# Patient Record
Sex: Male | Born: 1960 | Hispanic: Refuse to answer | Marital: Married | Smoking: Current every day smoker
Health system: Southern US, Community
[De-identification: ages and names within clinical notes are randomized; demographics above are authoritative.]

## PROBLEM LIST (undated history)

## (undated) DIAGNOSIS — G8929 Other chronic pain: Secondary | ICD-10-CM

## (undated) DIAGNOSIS — R188 Other ascites: Secondary | ICD-10-CM

## (undated) DIAGNOSIS — I10 Essential (primary) hypertension: Secondary | ICD-10-CM

## (undated) DIAGNOSIS — I639 Cerebral infarction, unspecified: Secondary | ICD-10-CM

## (undated) DIAGNOSIS — M25552 Pain in left hip: Secondary | ICD-10-CM

## (undated) DIAGNOSIS — K7460 Unspecified cirrhosis of liver: Secondary | ICD-10-CM

## (undated) HISTORY — PX: CERVICAL SPINE SURGERY: SHX589

## (undated) HISTORY — PX: BACK SURGERY: SHX140

## (undated) HISTORY — PX: JOINT REPLACEMENT: SHX530

---

## 2009-04-06 ENCOUNTER — Emergency Department (HOSPITAL_COMMUNITY): Admission: EM | Admit: 2009-04-06 | Discharge: 2009-04-06 | Payer: Self-pay | Admitting: Emergency Medicine

## 2010-02-13 ENCOUNTER — Emergency Department (HOSPITAL_COMMUNITY): Admission: EM | Admit: 2010-02-13 | Discharge: 2010-02-13 | Payer: Self-pay | Admitting: Emergency Medicine

## 2010-12-09 LAB — URINALYSIS, ROUTINE W REFLEX MICROSCOPIC
Glucose, UA: NEGATIVE mg/dL
Hgb urine dipstick: NEGATIVE
Ketones, ur: 15 mg/dL — AB
Leukocytes, UA: NEGATIVE
Nitrite: NEGATIVE
Protein, ur: 30 mg/dL — AB
Specific Gravity, Urine: 1.033 — ABNORMAL HIGH (ref 1.005–1.030)
Urobilinogen, UA: 1 mg/dL (ref 0.0–1.0)
pH: 5 (ref 5.0–8.0)

## 2010-12-09 LAB — POCT I-STAT, CHEM 8
BUN: 26 mg/dL — ABNORMAL HIGH (ref 6–23)
Calcium, Ion: 1.28 mmol/L (ref 1.12–1.32)
Chloride: 95 mEq/L — ABNORMAL LOW (ref 96–112)
Creatinine, Ser: 1.8 mg/dL — ABNORMAL HIGH (ref 0.4–1.5)
Glucose, Bld: 103 mg/dL — ABNORMAL HIGH (ref 70–99)
HCT: 49 % (ref 39.0–52.0)
Hemoglobin: 16.7 g/dL (ref 13.0–17.0)
Potassium: 4.7 mEq/L (ref 3.5–5.1)
Sodium: 128 mEq/L — ABNORMAL LOW (ref 135–145)
TCO2: 28 mmol/L (ref 0–100)

## 2010-12-09 LAB — URINE CULTURE
Colony Count: NO GROWTH
Culture: NO GROWTH

## 2010-12-09 LAB — URINE MICROSCOPIC-ADD ON

## 2010-12-09 LAB — CK: Total CK: 685 U/L — ABNORMAL HIGH (ref 7–232)

## 2010-12-29 LAB — COMPREHENSIVE METABOLIC PANEL
ALT: 117 U/L — ABNORMAL HIGH (ref 0–53)
AST: 194 U/L — ABNORMAL HIGH (ref 0–37)
Albumin: 5 g/dL (ref 3.5–5.2)
Alkaline Phosphatase: 58 U/L (ref 39–117)
BUN: 26 mg/dL — ABNORMAL HIGH (ref 6–23)
CO2: 22 mEq/L (ref 19–32)
Calcium: 10.9 mg/dL — ABNORMAL HIGH (ref 8.4–10.5)
Chloride: 93 mEq/L — ABNORMAL LOW (ref 96–112)
Creatinine, Ser: 2.96 mg/dL — ABNORMAL HIGH (ref 0.4–1.5)
GFR calc Af Amer: 27 mL/min — ABNORMAL LOW (ref >60)
GFR calc non Af Amer: 23 mL/min — ABNORMAL LOW (ref >60)
Glucose, Bld: 110 mg/dL — ABNORMAL HIGH (ref 70–99)
Potassium: 4.9 mEq/L (ref 3.5–5.1)
Sodium: 133 mEq/L — ABNORMAL LOW (ref 135–145)
Total Bilirubin: 1.1 mg/dL (ref 0.3–1.2)
Total Protein: 10.1 g/dL — ABNORMAL HIGH (ref 6.0–8.3)

## 2010-12-29 LAB — CBC
HCT: 52.2 % — ABNORMAL HIGH (ref 39.0–52.0)
Hemoglobin: 17.6 g/dL — ABNORMAL HIGH (ref 13.0–17.0)
MCHC: 33.7 g/dL (ref 30.0–36.0)
MCV: 95.5 fL (ref 78.0–100.0)
Platelets: 256 10*3/uL (ref 150–400)
RBC: 5.46 MIL/uL (ref 4.22–5.81)
RDW: 13.1 % (ref 11.5–15.5)
WBC: 5.7 10*3/uL (ref 4.0–10.5)

## 2010-12-29 LAB — URINALYSIS, ROUTINE W REFLEX MICROSCOPIC
Glucose, UA: NEGATIVE mg/dL
Ketones, ur: 15 mg/dL — AB
Leukocytes, UA: NEGATIVE
Nitrite: NEGATIVE
Protein, ur: 100 mg/dL — AB
Specific Gravity, Urine: 1.021 (ref 1.005–1.030)
Urobilinogen, UA: 1 mg/dL (ref 0.0–1.0)
pH: 5 (ref 5.0–8.0)

## 2010-12-29 LAB — CK TOTAL AND CKMB (NOT AT ARMC)
CK, MB: 11.9 ng/mL — ABNORMAL HIGH (ref 0.3–4.0)
Relative Index: 0.9 (ref 0.0–2.5)
Total CK: 1284 U/L — ABNORMAL HIGH (ref 7–232)

## 2010-12-29 LAB — URINE MICROSCOPIC-ADD ON

## 2010-12-29 LAB — DIFFERENTIAL
Basophils Absolute: 0 10*3/uL (ref 0.0–0.1)
Basophils Relative: 0 % (ref 0–1)
Eosinophils Absolute: 0 10*3/uL (ref 0.0–0.7)
Eosinophils Relative: 0 % (ref 0–5)
Lymphocytes Relative: 17 % (ref 12–46)
Lymphs Abs: 1 10*3/uL (ref 0.7–4.0)
Monocytes Absolute: 0.3 10*3/uL (ref 0.1–1.0)
Monocytes Relative: 6 % (ref 3–12)
Neutro Abs: 4.3 10*3/uL (ref 1.7–7.7)
Neutrophils Relative %: 76 % (ref 43–77)

## 2010-12-29 LAB — BASIC METABOLIC PANEL
BUN: 27 mg/dL — ABNORMAL HIGH (ref 6–23)
CO2: 22 mEq/L (ref 19–32)
Calcium: 8.7 mg/dL (ref 8.4–10.5)
Chloride: 105 mEq/L (ref 96–112)
Creatinine, Ser: 1.95 mg/dL — ABNORMAL HIGH (ref 0.4–1.5)
GFR calc Af Amer: 44 mL/min — ABNORMAL LOW (ref >60)
GFR calc non Af Amer: 37 mL/min — ABNORMAL LOW (ref >60)
Glucose, Bld: 148 mg/dL — ABNORMAL HIGH (ref 70–99)
Potassium: 4.4 mEq/L (ref 3.5–5.1)
Sodium: 134 mEq/L — ABNORMAL LOW (ref 135–145)

## 2012-06-25 ENCOUNTER — Emergency Department (HOSPITAL_COMMUNITY)
Admission: EM | Admit: 2012-06-25 | Discharge: 2012-06-25 | Disposition: A | Discharge status: Home / Self Care | Payer: Self-pay | Attending: Emergency Medicine | Admitting: Emergency Medicine

## 2012-06-25 ENCOUNTER — Encounter (HOSPITAL_COMMUNITY): Payer: Self-pay | Admitting: *Deleted

## 2012-06-25 ENCOUNTER — Emergency Department (HOSPITAL_COMMUNITY): Payer: Self-pay

## 2012-06-25 DIAGNOSIS — M25559 Pain in unspecified hip: Secondary | ICD-10-CM | POA: Insufficient documentation

## 2012-06-25 DIAGNOSIS — R269 Unspecified abnormalities of gait and mobility: Secondary | ICD-10-CM | POA: Insufficient documentation

## 2012-06-25 DIAGNOSIS — I10 Essential (primary) hypertension: Secondary | ICD-10-CM | POA: Insufficient documentation

## 2012-06-25 HISTORY — DX: Essential (primary) hypertension: I10

## 2012-06-25 LAB — BASIC METABOLIC PANEL
BUN: 14 mg/dL (ref 6–23)
CO2: 23 mEq/L (ref 19–32)
Calcium: 10.5 mg/dL (ref 8.4–10.5)
Chloride: 105 mEq/L (ref 96–112)
Creatinine, Ser: 1.06 mg/dL (ref 0.50–1.35)
GFR calc Af Amer: >90 mL/min (ref >90)
GFR calc non Af Amer: 79 mL/min — ABNORMAL LOW (ref >90)
Glucose, Bld: 111 mg/dL — ABNORMAL HIGH (ref 70–99)
Potassium: 3.4 mEq/L — ABNORMAL LOW (ref 3.5–5.1)
Sodium: 138 mEq/L (ref 135–145)

## 2012-06-25 LAB — CBC WITH DIFFERENTIAL/PLATELET
Basophils Absolute: 0 10*3/uL (ref 0.0–0.1)
Basophils Relative: 0 % (ref 0–1)
Eosinophils Absolute: 0.2 10*3/uL (ref 0.0–0.7)
Eosinophils Relative: 3 % (ref 0–5)
HCT: 37.1 % — ABNORMAL LOW (ref 39.0–52.0)
Hemoglobin: 12.7 g/dL — ABNORMAL LOW (ref 13.0–17.0)
Lymphocytes Relative: 53 % — ABNORMAL HIGH (ref 12–46)
Lymphs Abs: 4.5 10*3/uL — ABNORMAL HIGH (ref 0.7–4.0)
MCH: 29.7 pg (ref 26.0–34.0)
MCHC: 34.2 g/dL (ref 30.0–36.0)
MCV: 86.9 fL (ref 78.0–100.0)
Monocytes Absolute: 0.7 10*3/uL (ref 0.1–1.0)
Monocytes Relative: 8 % (ref 3–12)
Neutro Abs: 3.1 10*3/uL (ref 1.7–7.7)
Neutrophils Relative %: 36 % — ABNORMAL LOW (ref 43–77)
Platelets: 242 10*3/uL (ref 150–400)
RBC: 4.27 MIL/uL (ref 4.22–5.81)
RDW: 14.2 % (ref 11.5–15.5)
WBC: 8.5 10*3/uL (ref 4.0–10.5)

## 2012-06-25 MED ORDER — KETOROLAC TROMETHAMINE 60 MG/2ML IM SOLN
60.0000 mg | Freq: Once | INTRAMUSCULAR | Status: AC
  Administered 2012-06-25: 60 mg via INTRAMUSCULAR
  Filled 2012-06-25: qty 2

## 2012-06-25 MED ORDER — OXYCODONE-ACETAMINOPHEN 5-325 MG PO TABS
2.0000 | ORAL_TABLET | Freq: Once | ORAL | Status: AC
  Administered 2012-06-25: 2 via ORAL
  Filled 2012-06-25: qty 2

## 2012-06-25 MED ORDER — OXYCODONE-ACETAMINOPHEN 5-325 MG PO TABS: 1.0000 | ORAL_TABLET | ORAL | Status: DC | PRN

## 2012-06-25 MED ORDER — NAPROXEN 500 MG PO TABS: 500.0000 mg | ORAL_TABLET | Freq: Two times a day (BID) | ORAL | Status: DC

## 2012-06-25 NOTE — Progress Notes (Signed)
Orthopedic Tech Progress Note Patient Details:  Calvin [name] October 24, 1960 [id number]  Ortho Devices Type of Ortho Device: Knee Immobilizer   Haskell Flirt 06/25/2012, 11:19 PM

## 2012-06-25 NOTE — ED Notes (Signed)
Ortho called for knee imobilizer.  

## 2012-06-25 NOTE — ED Provider Notes (Signed)
History     CSN: [id number]  Arrival date & time 06/25/12  4098   First MD Initiated Contact with Patient 06/25/12 2257      Chief Complaint  Patient presents with  . Hip Pain    (Consider location/radiation/quality/duration/timing/severity/associated sxs/prior treatment) HPI Comments: Pt has hx of hip surgery in the past in 1999, he had a spontaneous hip dislocation in July of this year and since that time he states that he has had recurrent dislocations and ongoing pain in his hip. This pain is constant, daily, not associated with fevers, worse with ambulation. He does require crutches to walk. He has not seen an orthopedist. He was recently incarcerated and has been released, his family and asked them to seek specialty followup.  Patient is a 51 y.o. male presenting with hip pain. The history is provided by the patient and a relative.  Hip Pain    Past Medical History  Diagnosis Date  . Hypertension     Past Surgical History  Procedure Date  . Joint replacement   . Back surgery   . Cervical spine surgery     No family history on file.  History  Substance Use Topics  . Smoking status: Current Every Day Smoker  . Smokeless tobacco: Not on file  . Alcohol Use: Yes      Review of Systems  Constitutional: Negative for fever and chills.  Gastrointestinal: Negative for nausea and vomiting.  Musculoskeletal: Positive for gait problem. Negative for back pain.  Skin: Negative for rash.  Neurological: Negative for weakness and numbness.    Allergies  Review of patient's allergies indicates no known allergies.  Home Medications   Current Outpatient Rx  Name Route Sig Dispense Refill  . IBUPROFEN 800 MG PO TABS Oral Take 800 mg by mouth every 8 (eight) hours as needed. For pain    . NAPROXEN 500 MG PO TABS Oral Take 1 tablet (500 mg total) by mouth 2 (two) times daily with a meal. 30 tablet 0  . OXYCODONE-ACETAMINOPHEN 5-325 MG PO TABS Oral Take 1 tablet by mouth  every 4 (four) hours as needed for pain. 20 tablet 0    BP 142/82  Pulse 90  Temp 98.3 F (36.8 C) (Oral)  Resp 20  SpO2 100%  Physical Exam  Constitutional: He appears well-developed and well-nourished. No distress.  HENT:  Head: Normocephalic and atraumatic.  Eyes: Conjunctivae normal are normal. No scleral icterus.  Cardiovascular: Normal rate and intact distal pulses.   Pulmonary/Chest: Effort normal.  Musculoskeletal: He exhibits tenderness ( Tenderness to palpation over the left hip, mild pain with range of motion, no leg length discrepancy, patient is able to ambulate but with significant difficulty.). He exhibits no edema.  Neurological: He is alert. Coordination normal.       Normal sensation and motor to the left lower extremity  Skin: Skin is warm and dry.    ED Course  Procedures (including critical care time)  Labs Reviewed  BASIC METABOLIC PANEL - Abnormal; Notable for the following:    Potassium 3.4 (*)     Glucose, Bld 111 (*)     GFR calc non Af Amer 79 (*)     All other components within normal limits  CBC WITH DIFFERENTIAL - Abnormal; Notable for the following:    Hemoglobin 12.7 (*)     HCT 37.1 (*)     Neutrophils Relative 36 (*)     Lymphocytes Relative 53 (*)  Lymphs Abs 4.5 (*)     All other components within normal limits   Dg Hip Complete Left  06/25/2012  *RADIOLOGY REPORT*  Clinical Data: Pain  LEFT HIP - COMPLETE 2+ VIEW  Comparison: None.  Findings: Changes of left hip arthroplasty.  Cephalad migration of the acetabular component with significant surrounding lucency.  The prosthetic femoral head is eccentrically positioned with respect to the acetabular component.  IMPRESSION: 1.  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 Significant lucency around the acetabular component suggests possible loosening or infection.   Original Report Authenticated By: Thora Lance III, M.D.      1. Hip pain       MDM  X-rays  show abnormal joint, arthroplasty present, migration of the acetabular component with poor alignment but no obvious dislocation. I doubt that the patient has an infection as he does not have a leukocytosis of fever or tachycardia and this is a chronic daily pain for months now. He will be given intramuscular Toradol, Percocet, home with pain medications and referral to an orthopedist.        Vida Roller, MD 06/25/12 [phone]

## 2012-06-25 NOTE — ED Notes (Addendum)
C/o L hip pain land leg swelling, occurred/ onset 03/01/12, c/o decreased circulation and swelling in L eg, 10/10 pain, using crutches. Alert, NAD, calm, interactive. Swelling noted to LLE, no pitting obvious. PT pulses palpable. Reports hip replacement in June, "feels like it is out of place".

## 2012-06-25 NOTE — ED Notes (Signed)
Pt not really willing to talk or elaborate with answer. Pt states that he [place] his [place] and they did not it back in place. Pt states he has been living with it dislocated since then and he just got out of [place].

## 2012-07-06 ENCOUNTER — Other Ambulatory Visit (HOSPITAL_COMMUNITY): Payer: Self-pay | Admitting: Orthopedic Surgery

## 2012-07-06 DIAGNOSIS — M25552 Pain in left hip: Secondary | ICD-10-CM

## 2012-07-06 DIAGNOSIS — M810 Age-related osteoporosis without current pathological fracture: Secondary | ICD-10-CM

## 2012-07-08 ENCOUNTER — Ambulatory Visit (HOSPITAL_COMMUNITY)
Admission: RE | Admit: 2012-07-08 | Discharge: 2012-07-08 | Disposition: A | Discharge status: Home / Self Care | Payer: Self-pay | Source: Ambulatory Visit | Attending: Orthopedic Surgery | Admitting: Orthopedic Surgery

## 2012-07-08 ENCOUNTER — Other Ambulatory Visit (HOSPITAL_COMMUNITY): Payer: Self-pay

## 2012-07-08 DIAGNOSIS — M949 Disorder of cartilage, unspecified: Secondary | ICD-10-CM | POA: Insufficient documentation

## 2012-07-08 DIAGNOSIS — Z96649 Presence of unspecified artificial hip joint: Secondary | ICD-10-CM | POA: Insufficient documentation

## 2012-07-08 DIAGNOSIS — M25559 Pain in unspecified hip: Secondary | ICD-10-CM | POA: Insufficient documentation

## 2012-07-08 DIAGNOSIS — M899 Disorder of bone, unspecified: Secondary | ICD-10-CM | POA: Insufficient documentation

## 2012-07-08 DIAGNOSIS — M810 Age-related osteoporosis without current pathological fracture: Secondary | ICD-10-CM

## 2012-07-08 DIAGNOSIS — M25552 Pain in left hip: Secondary | ICD-10-CM

## 2012-07-20 ENCOUNTER — Encounter (HOSPITAL_COMMUNITY): Payer: Self-pay | Admitting: *Deleted

## 2012-07-20 ENCOUNTER — Emergency Department (HOSPITAL_COMMUNITY)
Admission: EM | Admit: 2012-07-20 | Discharge: 2012-07-20 | Disposition: A | Discharge status: Home / Self Care | Payer: Self-pay | Attending: Emergency Medicine | Admitting: Emergency Medicine

## 2012-07-20 DIAGNOSIS — Z8781 Personal history of (healed) traumatic fracture: Secondary | ICD-10-CM | POA: Insufficient documentation

## 2012-07-20 DIAGNOSIS — I10 Essential (primary) hypertension: Secondary | ICD-10-CM | POA: Insufficient documentation

## 2012-07-20 DIAGNOSIS — F17200 Nicotine dependence, unspecified, uncomplicated: Secondary | ICD-10-CM | POA: Insufficient documentation

## 2012-07-20 DIAGNOSIS — Z79899 Other long term (current) drug therapy: Secondary | ICD-10-CM | POA: Insufficient documentation

## 2012-07-20 DIAGNOSIS — Z760 Encounter for issue of repeat prescription: Secondary | ICD-10-CM | POA: Insufficient documentation

## 2012-07-20 DIAGNOSIS — M25559 Pain in unspecified hip: Secondary | ICD-10-CM

## 2012-07-20 DIAGNOSIS — G8929 Other chronic pain: Secondary | ICD-10-CM

## 2012-07-20 DIAGNOSIS — Z9889 Other specified postprocedural states: Secondary | ICD-10-CM | POA: Insufficient documentation

## 2012-07-20 MED ORDER — OXYCODONE-ACETAMINOPHEN 7.5-325 MG PO TABS: 1.0000 | ORAL_TABLET | ORAL | Status: DC | PRN

## 2012-07-20 NOTE — ED Notes (Signed)
Pt is here with right hip pain and states it has been dislocated since July.  PT states seen here 2-3 weeks ago and pt states he was supposed to follow up with orthopedics and he went and had MRI done here.  Pt has ran out of pain medication and hopes he can get medicine and get another appointment

## 2012-07-20 NOTE — ED Notes (Signed)
Patient reports chronic left hip pain.

## 2012-07-20 NOTE — ED Provider Notes (Signed)
History  Scribed for Nelia Shi, MD, the patient was seen in room TR07C/TR07C. This chart was scribed by Candelaria Stagers. The patient's care started at 4:35 PM   CSN: [id number]  Arrival date & time 07/20/12  1625   None     Chief Complaint  Patient presents with  . Hip Pain    The history is provided by the patient. No language interpreter was used.   Calvin [name] is a 51 y.o. male who presents to the Emergency Department complaining of continued left hip pain that became worse over the last few days after he ran out of his pain medication.  Pt reports that he dislocated his hip in July of this year.  Pt was seen in the ED 2-3 weeks ago and was supposed to follow up with the orthopedics.  Patient missed his appointment yesterday because transportation issues.   Past Medical History  Diagnosis Date  . Hypertension     Past Surgical History  Procedure Date  . Joint replacement   . Back surgery   . Cervical spine surgery     No family history on file.  History  Substance Use Topics  . Smoking status: Current Every Day Smoker  . Smokeless tobacco: Not on file  . Alcohol Use: Yes      Review of Systems All other systems reviewed and are negative Allergies  Review of patient's allergies indicates no known allergies.  Home Medications   Current Outpatient Rx  Name Route Sig Dispense Refill  . ACETAMINOPHEN 325 MG PO TABS Oral Take 650 mg by mouth daily as needed. For pain    . HYDROCHLOROTHIAZIDE 25 MG PO TABS Oral Take 25 mg by mouth daily.    . OXYCODONE-ACETAMINOPHEN 5-325 MG PO TABS Oral Take 1 tablet by mouth every 4 (four) hours as needed. For pain    . OXYCODONE-ACETAMINOPHEN 7.5-325 MG PO TABS Oral Take 1 tablet by mouth every 4 (four) hours as needed for pain. 30 tablet 0    BP 144/96  Pulse 95  Temp 98.1 F (36.7 C) (Oral)  Resp 18  SpO2 99%  Physical Exam  Constitutional: He appears well-developed and well-nourished. No distress.  HENT:    Head: Normocephalic and atraumatic.  Eyes: Conjunctivae normal are normal. No scleral icterus.  Cardiovascular: Normal rate and intact distal pulses.   Pulmonary/Chest: Effort normal.  Musculoskeletal: He exhibits tenderness ( Tenderness to palpation over the left hip, mild pain with range of motion, no leg length discrepancy, patient is able to ambulate but with significant difficulty.). He exhibits no edema.  Neurological: He is alert. Coordination normal.       Normal sensation and motor to the left lower extremity  Skin: Skin is warm and dry.    ED Course  Procedures   DIAGNOSTIC STUDIES:  COORDINATION OF CARE:    Labs Reviewed - No data to display No results found.   1. Chronic hip pain       MDM  I personally performed the services described in this documentation, which was scribed in my presence. The recorded information has been reviewed and considered.    I refill the patient's medication.  I emphasized to him the importance of following up with orthopedic doctor for definitive treatment and care.  Patient has no fever or subjective signs of infection.      Nelia Shi, MD 07/20/12 [phone]

## 2012-08-03 ENCOUNTER — Encounter (HOSPITAL_COMMUNITY): Payer: Self-pay | Admitting: *Deleted

## 2012-08-03 ENCOUNTER — Emergency Department (HOSPITAL_COMMUNITY)
Admission: EM | Admit: 2012-08-03 | Discharge: 2012-08-03 | Disposition: A | Discharge status: Home / Self Care | Payer: Self-pay | Attending: Emergency Medicine | Admitting: Emergency Medicine

## 2012-08-03 DIAGNOSIS — F17200 Nicotine dependence, unspecified, uncomplicated: Secondary | ICD-10-CM | POA: Insufficient documentation

## 2012-08-03 DIAGNOSIS — G8929 Other chronic pain: Secondary | ICD-10-CM | POA: Insufficient documentation

## 2012-08-03 DIAGNOSIS — I10 Essential (primary) hypertension: Secondary | ICD-10-CM | POA: Insufficient documentation

## 2012-08-03 DIAGNOSIS — Z79899 Other long term (current) drug therapy: Secondary | ICD-10-CM | POA: Insufficient documentation

## 2012-08-03 DIAGNOSIS — M25559 Pain in unspecified hip: Secondary | ICD-10-CM | POA: Insufficient documentation

## 2012-08-03 HISTORY — DX: Other chronic pain: G89.29

## 2012-08-03 HISTORY — DX: Pain in left hip: M25.552

## 2012-08-03 MED ORDER — OXYCODONE-ACETAMINOPHEN 7.5-325 MG PO TABS: 1.0000 | ORAL_TABLET | ORAL | Status: DC | PRN

## 2012-08-03 MED ORDER — OXYCODONE-ACETAMINOPHEN 5-325 MG PO TABS
2.0000 | ORAL_TABLET | Freq: Once | ORAL | Status: AC
  Administered 2012-08-03: 2 via ORAL
  Filled 2012-08-03: qty 2

## 2012-08-03 NOTE — ED Provider Notes (Signed)
History  This chart was scribed for Laray Anger, DO by Bennett Scrape, ED Scribe. This patient was seen in room TR07C/TR07C and the patient's care was started at 3:34PM.  CSN: [id number]  Arrival date & time 08/03/12  1424   First MD Initiated Contact with Patient 08/03/12 1534      Chief Complaint  Patient presents with  . Hip Pain     The history is provided by the patient. No language interpreter was used.   Pt was seen at 4:00 PM  Calvin [name] is a 51 y.o. male who presents to the Emergency Department c/o gradual onset and persistence of constant acute flair of his chronic left hip pain for the past several days.  Pain began after he ran out of his percocet.  Pt is here today requesting a refill on his percocet prescription.  Pt describes his pain as per his usual chronic left hip pain attributed to a "bad hip joint replacement." He was seen here for the same on 07/20/12 and discharged with 7.5-325 mg Percocet prescription. He reports that he is currently following up with his orthopedist but couldn't get an appointment scheduled. He states that his orthopedist is trying to get him an appointment with a specialist at [facility] for this condition. Denies any change in his usual chronic pain pattern.  Pain worsens with palpation of the area and body position changes. Denies incont/retention of bowel or bladder, no saddle anesthesia, no focal motor weakness, no tingling/numbness in extremities, no fevers, no injury, no abd pain.   The symptoms have been associated with no other complaints. The patient has a significant history of similar symptoms previously, recently being evaluated for this complaint and multiple prior evals for same.     Dr. August Saucer is his Orthopedist.  Past Medical History  Diagnosis Date  . Hypertension   . Chronic left hip pain     Past Surgical History  Procedure Date  . Joint replacement   . Back surgery   . Cervical spine surgery      History    Substance Use Topics  . Smoking status: Current Every Day Smoker  . Smokeless tobacco: Not on file  . Alcohol Use: Yes      Review of Systems ROS: Statement: All systems negative except as marked or noted in the HPI; Constitutional: Negative for fever and chills. ; ; Eyes: Negative for eye pain, redness and discharge. ; ; ENMT: Negative for ear pain, hoarseness, nasal congestion, sinus pressure and sore throat. ; ; Cardiovascular: Negative for chest pain, palpitations, diaphoresis, dyspnea and peripheral edema. ; ; Respiratory: Negative for cough, wheezing and stridor. ; ; Gastrointestinal: Negative for nausea, vomiting, diarrhea, abdominal pain, blood in stool, hematemesis, jaundice and rectal bleeding. . ; ; Genitourinary: Negative for dysuria, flank pain and hematuria. ; ; Musculoskeletal: +chronic left hip pain. Negative for back pain and neck pain. Negative for swelling and trauma.; ; Skin: Negative for pruritus, rash, abrasions, blisters, bruising and skin lesion.; ; Neuro: Negative for headache, lightheadedness and neck stiffness. Negative for weakness, altered level of consciousness , altered mental status, extremity weakness, paresthesias, involuntary movement, seizure and syncope.       Allergies  Review of patient's allergies indicates no known allergies.  Home Medications   Current Outpatient Rx  Name  Route  Sig  Dispense  Refill  . ACETAMINOPHEN 500 MG PO TABS   Oral   Take 500 mg by mouth every 6 (six) hours as  needed. For pain         . HYDROCHLOROTHIAZIDE 25 MG PO TABS   Oral   Take 25 mg by mouth daily.         . OXYCODONE-ACETAMINOPHEN 7.5-325 MG PO TABS   Oral   Take 1 tablet by mouth every 4 (four) hours as needed for pain.   30 tablet   0     Triage Vitals: BP 148/93  Pulse 101  Temp 97.7 F (36.5 C) (Oral)  Resp 20  SpO2 99%  Physical Exam 1605: Physical examination:  Nursing notes reviewed; Vital signs and O2 SAT reviewed;  Constitutional:  Well developed, Well nourished, Well hydrated, In no acute distress; Head:  Normocephalic, atraumatic; Eyes: EOMI, PERRL, No scleral icterus; ENMT: Mouth and pharynx normal, Mucous membranes moist; Neck: Supple, Full range of motion, No lymphadenopathy; Cardiovascular: Regular rate and rhythm, No murmur, rub, or gallop; Respiratory: Breath sounds clear & equal bilaterally, No rales, rhonchi, wheezes.  Speaking full sentences with ease, Normal respiratory effort/excursion; Chest: Nontender, Movement normal; Abdomen: Soft, Nontender, Nondistended, Normal bowel sounds;Extremities: Pulses normal, +left hip tenderness to palp. No edema, No calf edema or asymmetry.; Neuro: AA&Ox3, Major CN grossly intact.  Speech clear. Walking with crutches. No gross focal motor or sensory deficits in extremities.; Skin: Color normal, Warm, Dry.   ED Course  Procedures   DIAGNOSTIC STUDIES: Oxygen Saturation is 99% on room air, normal by my interpretation.    COORDINATION OF CARE: 4:04 PM- Discussed discharge plan which includes short course of percocet refill for with pt at bedside and pt agreed to plan. Long hx of chronic pain with multiple ED visits for same.  Pt endorses acute flair of his usual long standing chronic pain today, no change from his usual chronic pain pattern.  Pt encouraged to f/u with his PMD, Orthopedist and Pain Management doctor for good continuity of care and control of his chronic pain.  Verb understanding.    MDM  MDM Reviewed: previous chart, nursing note and vitals Reviewed previous: x-ray        I personally performed the services described in this documentation, which was scribed in my presence. The recorded information has been reviewed and is accurate.    Laray Anger, DO 08/05/12 1228

## 2012-08-03 NOTE — ED Notes (Signed)
Pt is here with chronic dislocated hip and was given prescription and is out now..  Pt needs refill on percocet

## 2012-08-03 NOTE — ED Notes (Signed)
Pt has been seeing Dr. Dorene Grebe for chronic hip pain. Is waiting for Dr.  From [facility] to call for appoint. Pt is out of pain meds. Called Dr. Diamantina Providence office today but he was in surgery.-

## 2012-08-03 NOTE — ED Notes (Signed)
MD at bedside. 

## 2012-08-20 ENCOUNTER — Encounter (HOSPITAL_COMMUNITY): Payer: Self-pay | Admitting: Emergency Medicine

## 2012-08-20 ENCOUNTER — Emergency Department (HOSPITAL_COMMUNITY)
Admission: EM | Admit: 2012-08-20 | Discharge: 2012-08-20 | Disposition: A | Discharge status: Home / Self Care | Payer: Self-pay | Attending: Emergency Medicine | Admitting: Emergency Medicine

## 2012-08-20 DIAGNOSIS — M25559 Pain in unspecified hip: Secondary | ICD-10-CM | POA: Insufficient documentation

## 2012-08-20 DIAGNOSIS — Z96649 Presence of unspecified artificial hip joint: Secondary | ICD-10-CM | POA: Insufficient documentation

## 2012-08-20 DIAGNOSIS — M549 Dorsalgia, unspecified: Secondary | ICD-10-CM | POA: Insufficient documentation

## 2012-08-20 DIAGNOSIS — Z79899 Other long term (current) drug therapy: Secondary | ICD-10-CM | POA: Insufficient documentation

## 2012-08-20 DIAGNOSIS — I10 Essential (primary) hypertension: Secondary | ICD-10-CM | POA: Insufficient documentation

## 2012-08-20 DIAGNOSIS — G8929 Other chronic pain: Secondary | ICD-10-CM | POA: Insufficient documentation

## 2012-08-20 DIAGNOSIS — R509 Fever, unspecified: Secondary | ICD-10-CM | POA: Insufficient documentation

## 2012-08-20 DIAGNOSIS — R112 Nausea with vomiting, unspecified: Secondary | ICD-10-CM | POA: Insufficient documentation

## 2012-08-20 DIAGNOSIS — R5381 Other malaise: Secondary | ICD-10-CM | POA: Insufficient documentation

## 2012-08-20 DIAGNOSIS — R209 Unspecified disturbances of skin sensation: Secondary | ICD-10-CM | POA: Insufficient documentation

## 2012-08-20 DIAGNOSIS — F17200 Nicotine dependence, unspecified, uncomplicated: Secondary | ICD-10-CM | POA: Insufficient documentation

## 2012-08-20 MED ORDER — MORPHINE SULFATE 4 MG/ML IJ SOLN
6.0000 mg | Freq: Once | INTRAMUSCULAR | Status: AC
  Administered 2012-08-20: 6 mg via INTRAMUSCULAR
  Filled 2012-08-20: qty 2

## 2012-08-20 MED ORDER — HYDROCODONE-ACETAMINOPHEN 10-500 MG PO TABS: 1.0000 | ORAL_TABLET | Freq: Four times a day (QID) | ORAL | Status: DC | PRN

## 2012-08-20 MED ORDER — KETOROLAC TROMETHAMINE 30 MG/ML IJ SOLN
60.0000 mg | Freq: Once | INTRAMUSCULAR | Status: AC
  Administered 2012-08-20: 60 mg via INTRAMUSCULAR
  Filled 2012-08-20 (×2): qty 1

## 2012-08-20 NOTE — ED Notes (Addendum)
Pt here with c/o chronic left hip pain. Friend in room with patient reports in a loud harsh voice pt needs pain medication that will work, surgery and admitted to see a Child psychotherapist. Pt reports he has seen his orthopedic surgeon who refuses to do surgery on hip. Friend states "we are going to keep coming in until we get what we want". Pt sitting in chair, reports it provides more comfort. Pt ambulated to room with crutches from triage area.

## 2012-08-20 NOTE — ED Provider Notes (Signed)
History     CSN: [id number]  Arrival date & time 08/20/12  1478   First MD Initiated Contact with Patient 08/20/12 [phone]      Chief Complaint  Patient presents with  . Hip Pain    (Consider location/radiation/quality/duration/timing/severity/associated sxs/prior treatment) HPI Patient presents to the emergency department with left hip pain.  Patient has had chronic hip pain since July.  Patient, states that he was working when he felt a pop in his hip back in July and ever since has had pain issues.  Patient's been seen by Dr. August Saucer, and referred to orthopedist in [place].  Patient, states they were not able to perform any surgery until April.  Patient has weakness, numbness, nausea, vomiting, back pain, or fever. Past Medical History  Diagnosis Date  . Hypertension   . Chronic left hip pain     Past Surgical History  Procedure Date  . Joint replacement   . Back surgery   . Cervical spine surgery     History reviewed. No pertinent family history.  History  Substance Use Topics  . Smoking status: Current Every Day Smoker  . Smokeless tobacco: Not on file  . Alcohol Use: Yes      Review of Systems All other systems negative except as documented in the HPI. All pertinent positives and negatives as reviewed in the HPI. Allergies  Review of patient's allergies indicates no known allergies.  Home Medications   Current Outpatient Rx  Name  Route  Sig  Dispense  Refill  . ACETAMINOPHEN 500 MG PO TABS   Oral   Take 1,500 mg by mouth every 6 (six) hours as needed. For pain         . HYDROCHLOROTHIAZIDE 25 MG PO TABS   Oral   Take 25 mg by mouth daily.         . OXYCODONE-ACETAMINOPHEN 7.5-325 MG PO TABS   Oral   Take 1 tablet by mouth every 4 (four) hours as needed. For pain           BP 111/71  Pulse 89  Temp 98.1 F (36.7 C) (Oral)  Resp 16  Ht 5\' 7"  (1.702 m)  Wt 170 lb (77.111 kg)  BMI 26.63 kg/m2  SpO2 98%  Physical Exam  Nursing note and  vitals reviewed. Constitutional: He is oriented to person, place, and time. He appears well-developed and well-nourished. No distress.  HENT:  Head: Normocephalic and atraumatic.  Pulmonary/Chest: Effort normal.  Musculoskeletal:       Left hip: He exhibits decreased range of motion, tenderness and bony tenderness. He exhibits no swelling, no crepitus and no laceration.       Left ankle: Achilles tendon normal.       Legs: Neurological: He is alert and oriented to person, place, and time.  Skin: Skin is warm and dry.    ED Course  Procedures (including critical care time)  Patient is advised he may need long-term care for his chronic hip pain until surgery can be performed.  Patient is advised to return here as needed.  Patient, states that the Percocet tends or not helping his pain  MDM          Carlyle Dolly, PA-C 08/20/12 1024

## 2012-08-21 NOTE — ED Provider Notes (Signed)
Medical screening examination/treatment/procedure(s) were performed by non-physician practitioner and as supervising physician I was immediately available for consultation/collaboration.  Corynne Scibilia, MD 08/21/12 0653 

## 2012-10-07 ENCOUNTER — Emergency Department (HOSPITAL_COMMUNITY)
Admission: EM | Admit: 2012-10-07 | Discharge: 2012-10-07 | Disposition: A | Discharge status: Home / Self Care | Payer: Self-pay | Source: Home / Self Care

## 2012-10-08 ENCOUNTER — Encounter (HOSPITAL_COMMUNITY): Payer: Self-pay

## 2012-10-08 ENCOUNTER — Emergency Department (HOSPITAL_COMMUNITY)
Admission: EM | Admit: 2012-10-08 | Discharge: 2012-10-08 | Disposition: A | Discharge status: Home / Self Care | Payer: Medicaid Other | Attending: Emergency Medicine | Admitting: Emergency Medicine

## 2012-10-08 DIAGNOSIS — M25559 Pain in unspecified hip: Secondary | ICD-10-CM | POA: Insufficient documentation

## 2012-10-08 DIAGNOSIS — I10 Essential (primary) hypertension: Secondary | ICD-10-CM | POA: Insufficient documentation

## 2012-10-08 DIAGNOSIS — F17200 Nicotine dependence, unspecified, uncomplicated: Secondary | ICD-10-CM | POA: Insufficient documentation

## 2012-10-08 DIAGNOSIS — G8929 Other chronic pain: Secondary | ICD-10-CM | POA: Insufficient documentation

## 2012-10-08 DIAGNOSIS — Z9889 Other specified postprocedural states: Secondary | ICD-10-CM | POA: Insufficient documentation

## 2012-10-08 DIAGNOSIS — R209 Unspecified disturbances of skin sensation: Secondary | ICD-10-CM | POA: Insufficient documentation

## 2012-10-08 MED ORDER — HYDROCODONE-ACETAMINOPHEN 5-325 MG PO TABS: 1.0000 | ORAL_TABLET | Freq: Four times a day (QID) | ORAL | Status: DC | PRN

## 2012-10-08 MED ORDER — HYDROCODONE-ACETAMINOPHEN 5-325 MG PO TABS
2.0000 | ORAL_TABLET | Freq: Once | ORAL | Status: AC
  Administered 2012-10-08: 2 via ORAL
  Filled 2012-10-08: qty 1

## 2012-10-08 NOTE — ED Notes (Signed)
Pt presents with L hip pain after dislocating same in 1999.  Pt reports surgery is scheduled in April, reports percocet 5-325mg  is not helping, last took medication yesterday.  Prescribed by Dr. August Saucer.  Pt denies any recent injury.

## 2012-10-08 NOTE — ED Provider Notes (Signed)
History   This chart was scribed for non-physician practitioner working with Carleene Cooper III, MD by Frederik Pear, ED Scribe. This patient was seen in room TR05C/TR05C and the patient's care was started at 1513.   CSN: [id number]  Arrival date & time 10/08/12  1312   First MD Initiated Contact with Patient 10/08/12 1513      No chief complaint on file.   (Consider location/radiation/quality/duration/timing/severity/associated sxs/prior treatment) HPI  Calvin [name] is a 52 y.o. male  with a hx of a left hip replacement in 1999 presents to the Emergency Department complaining of persistent, moderate left hip pain with associated numbness and tingling that has been constant since July. He denies any recent injury, but reports that he is out of his pain medication, Vicidin 5-325 mg taken every 6 -8 hours, from Dr. Rise Paganini. He states that nothing makes it better and bearing weight makes it worse. He denies any bowel or bladder incontinence, loss of function or saddle anesthesia. He states that Dr. August Saucer has referred him to Dr. Nettie Elm in [facility] for surgery, which is scheduled in April. He denies any other chronic medical conditions that require daily medication.   Past Medical History  Diagnosis Date  . Hypertension   . Chronic left hip pain     Past Surgical History  Procedure Date  . Joint replacement   . Back surgery   . Cervical spine surgery     History reviewed. No pertinent family history.  History  Substance Use Topics  . Smoking status: Current Every Day Smoker  . Smokeless tobacco: Not on file  . Alcohol Use: Yes      Review of Systems  Constitutional: Negative for fever, diaphoresis, appetite change, fatigue and unexpected weight change.  HENT: Negative for mouth sores and neck stiffness.   Eyes: Negative for visual disturbance.  Respiratory: Negative for cough, chest tightness, shortness of breath and wheezing.   Cardiovascular: Negative for  chest pain.  Gastrointestinal: Negative for nausea, vomiting, abdominal pain, diarrhea and constipation.  Genitourinary: Negative for dysuria, urgency, frequency and hematuria.  Musculoskeletal: Negative for back pain.       Hip pain.  Skin: Negative for rash.  Neurological: Positive for numbness. Negative for syncope, light-headedness and headaches.  Hematological: Does not bruise/bleed easily.  Psychiatric/Behavioral: Negative for sleep disturbance. The patient is not nervous/anxious.   All other systems reviewed and are negative.    Allergies  Review of patient's allergies indicates no known allergies.  Home Medications   Current Outpatient Rx  Name  Route  Sig  Dispense  Refill  . OXYCODONE-ACETAMINOPHEN 5-325 MG PO TABS   Oral   Take 1 tablet by mouth every 4 (four) hours as needed. For pain,         . HYDROCODONE-ACETAMINOPHEN 5-325 MG PO TABS   Oral   Take 1 tablet by mouth every 6 (six) hours as needed for pain.   30 tablet   0     BP 123/76  Pulse 68  Temp 98.5 F (36.9 C) (Oral)  Resp 20  SpO2 99%  Physical Exam  Nursing note and vitals reviewed. Constitutional: He is oriented to person, place, and time. He appears well-developed and well-nourished. No distress.  HENT:  Head: Normocephalic and atraumatic.  Mouth/Throat: Oropharynx is clear and moist. No oropharyngeal exudate.  Eyes: Conjunctivae normal are normal. Pupils are equal, round, and reactive to light. No scleral icterus.  Neck: Normal range of motion and full  passive range of motion without pain. Neck supple. No spinous process tenderness and no muscular tenderness present.  Cardiovascular: Normal rate, regular rhythm, S1 normal, S2 normal, normal heart sounds and intact distal pulses.   No murmur heard. Pulses:      Radial pulses are 2+ on the right side, and 2+ on the left side.       Dorsalis pedis pulses are 2+ on the right side, and 2+ on the left side.       Posterior tibial pulses are  2+ on the right side, and 2+ on the left side.       He ha good pulses.  Pulmonary/Chest: Effort normal and breath sounds normal. No respiratory distress. He has no wheezes.  Abdominal: Soft. Bowel sounds are normal. He exhibits no mass. There is no tenderness. There is no rebound and no guarding.  Musculoskeletal: Normal range of motion. He exhibits tenderness. He exhibits no edema.       He ha full ROM in his right leg with 5/5 strength including dorsiflexion and plantarflexion. He has 3/5 strength in his left leg, but has strong dorsiflexion and plantarflexion.  He has decreased ROM in his left hip that is secondary to pain. He is unable to bear weight on the left, but is able to bear weight on the right without difficulty.   Lymphadenopathy:    He has no cervical adenopathy.  Neurological: He is alert and oriented to person, place, and time. No cranial nerve deficit. He exhibits normal muscle tone. Coordination normal.       Speech is clear and goal oriented, follows commands Normal strength in upper extremities bilaterally including strong and equal grip strength Sensation normal to light and sharp touch Moves extremities without ataxia, coordination intact  Skin: Skin is warm and dry. He is not diaphoretic.       He has a well healed midline scar on his lumbar area.  Psychiatric: He has a normal mood and affect.    ED Course  Procedures (including critical care time)  DIAGNOSTIC STUDIES: Oxygen Saturation is 97% on room air, adequate by my interpretation.    COORDINATION OF CARE:  16:06- Discussed planned course of treatment with the patient, including following up with a PCP and pain medication, who is agreeable at this time.  16:15- Medication Orders- Hydrocodone-acetaminophen (Norco/Vicodin) 5-325 mg per tablet 2 tablet- Once.  Labs Reviewed - No data to display No results found.   1. Hip pain, chronic       MDM  Driscilla Moats Kretchmer presents with chronic hip pain and  c/o being out of his pain medications.  Patient with L hip pain.  No neurological deficits and decreased strength and ROM in the L is 2/2 to pain and at pt's baseline.  Patient is unable to weight bear on the hip and has been advised not to do so by the surgeon, but is able to move with the R leg.  No loss of bowel or bladder control.  No concern for cauda equina.  No fever, night sweats, weight loss, h/o cancer, IVDU.  RICE protocol and pain medicine indicated and discussed with patient.  Patient's pain treated here in the department, will write prescription for pain medication but have advised patient will be unable to continue to write pain medication.  I have also discussed reasons to return immediately to the ER.  Patient expresses understanding and agrees with plan.  1. Medications: Vicodin, usual home medications 2. Treatment: rest,  drink plenty of fluids, take medications as prescribed 3. Follow Up: Please followup with your primary doctor for discussion of your diagnoses and further evaluation after today's visit; if you do not have a primary care doctor use the resource guide provided to find one;   I personally performed the services described in this documentation, which was scribed in my presence. The recorded information has been reviewed and is accurate.   Dahlia Client Felecia Stanfill, PA-C 10/09/12 [phone]

## 2012-10-09 NOTE — ED Provider Notes (Signed)
Medical screening examination/treatment/procedure(s) were performed by non-physician practitioner and as supervising physician I was immediately available for consultation/collaboration.   Teller Wakefield III, MD 10/09/12 1202 

## 2013-03-12 ENCOUNTER — Encounter (HOSPITAL_COMMUNITY): Payer: Self-pay | Admitting: Adult Health

## 2013-03-12 DIAGNOSIS — Z7982 Long term (current) use of aspirin: Secondary | ICD-10-CM | POA: Insufficient documentation

## 2013-03-12 DIAGNOSIS — L989 Disorder of the skin and subcutaneous tissue, unspecified: Secondary | ICD-10-CM | POA: Insufficient documentation

## 2013-03-12 DIAGNOSIS — L03211 Cellulitis of face: Secondary | ICD-10-CM | POA: Insufficient documentation

## 2013-03-12 DIAGNOSIS — L02419 Cutaneous abscess of limb, unspecified: Secondary | ICD-10-CM | POA: Insufficient documentation

## 2013-03-12 DIAGNOSIS — L0201 Cutaneous abscess of face: Secondary | ICD-10-CM | POA: Insufficient documentation

## 2013-03-12 DIAGNOSIS — Z79899 Other long term (current) drug therapy: Secondary | ICD-10-CM | POA: Insufficient documentation

## 2013-03-12 DIAGNOSIS — L03119 Cellulitis of unspecified part of limb: Secondary | ICD-10-CM | POA: Insufficient documentation

## 2013-03-12 DIAGNOSIS — I10 Essential (primary) hypertension: Secondary | ICD-10-CM | POA: Insufficient documentation

## 2013-03-12 DIAGNOSIS — F17200 Nicotine dependence, unspecified, uncomplicated: Secondary | ICD-10-CM | POA: Insufficient documentation

## 2013-03-12 LAB — CBC WITH DIFFERENTIAL/PLATELET
Basophils Absolute: 0.1 10*3/uL (ref 0.0–0.1)
Basophils Relative: 0 % (ref 0–1)
Eosinophils Absolute: 0.3 10*3/uL (ref 0.0–0.7)
Eosinophils Relative: 2 % (ref 0–5)
HCT: 38.2 % — ABNORMAL LOW (ref 39.0–52.0)
Hemoglobin: 12.6 g/dL — ABNORMAL LOW (ref 13.0–17.0)
Lymphocytes Relative: 29 % (ref 12–46)
Lymphs Abs: 4.2 10*3/uL — ABNORMAL HIGH (ref 0.7–4.0)
MCH: 30.1 pg (ref 26.0–34.0)
MCHC: 33 g/dL (ref 30.0–36.0)
MCV: 91.2 fL (ref 78.0–100.0)
Monocytes Absolute: 1.1 10*3/uL — ABNORMAL HIGH (ref 0.1–1.0)
Monocytes Relative: 7 % (ref 3–12)
Neutro Abs: 8.8 10*3/uL — ABNORMAL HIGH (ref 1.7–7.7)
Neutrophils Relative %: 61 % (ref 43–77)
Platelets: 320 10*3/uL (ref 150–400)
RBC: 4.19 MIL/uL — ABNORMAL LOW (ref 4.22–5.81)
RDW: 14.5 % (ref 11.5–15.5)
WBC: 14.4 10*3/uL — ABNORMAL HIGH (ref 4.0–10.5)

## 2013-03-12 LAB — BASIC METABOLIC PANEL
BUN: 23 mg/dL (ref 6–23)
CO2: 26 mEq/L (ref 19–32)
Calcium: 10.2 mg/dL (ref 8.4–10.5)
Chloride: 96 mEq/L (ref 96–112)
Creatinine, Ser: 1.2 mg/dL (ref 0.50–1.35)
GFR calc Af Amer: 78 mL/min — ABNORMAL LOW (ref >90)
GFR calc non Af Amer: 67 mL/min — ABNORMAL LOW (ref >90)
Glucose, Bld: 106 mg/dL — ABNORMAL HIGH (ref 70–99)
Potassium: 3.6 mEq/L (ref 3.5–5.1)
Sodium: 132 mEq/L — ABNORMAL LOW (ref 135–145)

## 2013-03-12 NOTE — ED Notes (Signed)
Presents with right facial swelling, right hip induration, right leg numbness ongoing since may 19th post hip replacement. Pt has large wound to pubic area. Pt states he has having chills. Pt is ambulatory at triage.

## 2013-03-13 ENCOUNTER — Emergency Department (HOSPITAL_COMMUNITY)
Admission: EM | Admit: 2013-03-13 | Discharge: 2013-03-13 | Disposition: A | Discharge status: Home / Self Care | Payer: Medicaid Other | Attending: Emergency Medicine | Admitting: Emergency Medicine

## 2013-03-13 DIAGNOSIS — S31104A Unspecified open wound of abdominal wall, left lower quadrant without penetration into peritoneal cavity, initial encounter: Secondary | ICD-10-CM

## 2013-03-13 DIAGNOSIS — L02419 Cutaneous abscess of limb, unspecified: Secondary | ICD-10-CM

## 2013-03-13 DIAGNOSIS — L0201 Cutaneous abscess of face: Secondary | ICD-10-CM

## 2013-03-13 MED ORDER — OXYCODONE-ACETAMINOPHEN 5-325 MG PO TABS: 1.0000 | ORAL_TABLET | Freq: Four times a day (QID) | ORAL | Status: DC | PRN

## 2013-03-13 MED ORDER — CLINDAMYCIN HCL 300 MG PO CAPS
300.0000 mg | ORAL_CAPSULE | Freq: Once | ORAL | Status: AC
  Administered 2013-03-13: 300 mg via ORAL
  Filled 2013-03-13: qty 1

## 2013-03-13 MED ORDER — CLINDAMYCIN HCL 150 MG PO CAPS: 150.0000 mg | ORAL_CAPSULE | Freq: Three times a day (TID) | ORAL | Status: DC

## 2013-03-13 MED ORDER — OXYCODONE-ACETAMINOPHEN 5-325 MG PO TABS
1.0000 | ORAL_TABLET | Freq: Once | ORAL | Status: AC
  Administered 2013-03-13: 1 via ORAL
  Filled 2013-03-13: qty 1

## 2013-03-13 NOTE — ED Notes (Signed)
Patient presents with possible abscess to right side of face x several days, erythremia and swelling present, no respiratory distress noted, patient speaking in full complete sentences.
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
## 2013-05-03 ENCOUNTER — Ambulatory Visit: Payer: Medicaid Other | Admitting: Physical Therapy

## 2013-05-10 ENCOUNTER — Ambulatory Visit: Payer: Medicaid Other | Attending: Orthopedic Surgery | Admitting: Physical Therapy

## 2013-05-10 DIAGNOSIS — M25559 Pain in unspecified hip: Secondary | ICD-10-CM | POA: Insufficient documentation

## 2013-05-10 DIAGNOSIS — IMO0001 Reserved for inherently not codable concepts without codable children: Secondary | ICD-10-CM | POA: Insufficient documentation

## 2013-05-10 DIAGNOSIS — M25659 Stiffness of unspecified hip, not elsewhere classified: Secondary | ICD-10-CM | POA: Insufficient documentation

## 2013-05-10 DIAGNOSIS — H5440 Blindness, one eye, unspecified eye: Secondary | ICD-10-CM | POA: Insufficient documentation

## 2013-05-10 DIAGNOSIS — Z96649 Presence of unspecified artificial hip joint: Secondary | ICD-10-CM | POA: Insufficient documentation

## 2013-05-25 ENCOUNTER — Ambulatory Visit: Payer: Medicaid Other | Attending: Orthopedic Surgery | Admitting: Physical Therapy

## 2013-05-25 DIAGNOSIS — M25559 Pain in unspecified hip: Secondary | ICD-10-CM | POA: Insufficient documentation

## 2013-05-25 DIAGNOSIS — Z96649 Presence of unspecified artificial hip joint: Secondary | ICD-10-CM | POA: Insufficient documentation

## 2013-05-25 DIAGNOSIS — IMO0001 Reserved for inherently not codable concepts without codable children: Secondary | ICD-10-CM | POA: Insufficient documentation

## 2013-05-25 DIAGNOSIS — M25659 Stiffness of unspecified hip, not elsewhere classified: Secondary | ICD-10-CM | POA: Insufficient documentation

## 2013-05-25 DIAGNOSIS — H5440 Blindness, one eye, unspecified eye: Secondary | ICD-10-CM | POA: Insufficient documentation

## 2013-05-30 ENCOUNTER — Ambulatory Visit: Payer: Medicaid Other | Admitting: Physical Therapy

## 2013-06-01 ENCOUNTER — Ambulatory Visit: Payer: Medicaid Other | Admitting: Physical Therapy

## 2013-06-06 ENCOUNTER — Ambulatory Visit: Payer: Medicaid Other | Admitting: Physical Therapy

## 2013-06-08 ENCOUNTER — Ambulatory Visit: Payer: Medicaid Other | Admitting: Physical Therapy

## 2013-06-10 ENCOUNTER — Ambulatory Visit: Payer: Medicaid Other | Admitting: Physical Therapy

## 2013-06-14 ENCOUNTER — Ambulatory Visit: Payer: Medicaid Other | Admitting: Physical Therapy

## 2013-06-17 ENCOUNTER — Ambulatory Visit: Payer: Medicaid Other | Admitting: Physical Therapy

## 2013-06-21 ENCOUNTER — Encounter: Payer: Medicaid Other | Admitting: Physical Therapy

## 2013-06-24 ENCOUNTER — Encounter: Payer: Medicaid Other | Admitting: Physical Therapy

## 2014-08-09 ENCOUNTER — Other Ambulatory Visit (INDEPENDENT_AMBULATORY_CARE_PROVIDER_SITE_OTHER): Payer: Self-pay

## 2014-08-09 DIAGNOSIS — R945 Abnormal results of liver function studies: Secondary | ICD-10-CM

## 2014-08-09 DIAGNOSIS — K8020 Calculus of gallbladder without cholecystitis without obstruction: Secondary | ICD-10-CM

## 2016-06-22 ENCOUNTER — Emergency Department (HOSPITAL_COMMUNITY): Payer: Medicare Other

## 2016-06-22 ENCOUNTER — Observation Stay (HOSPITAL_COMMUNITY)
Admission: EM | Admit: 2016-06-22 | Discharge: 2016-06-23 | Disposition: A | Discharge status: Home / Self Care | Payer: Medicare Other | Attending: Internal Medicine | Admitting: Internal Medicine

## 2016-06-22 ENCOUNTER — Encounter (HOSPITAL_COMMUNITY): Payer: Self-pay | Admitting: Emergency Medicine

## 2016-06-22 DIAGNOSIS — Z79899 Other long term (current) drug therapy: Secondary | ICD-10-CM | POA: Insufficient documentation

## 2016-06-22 DIAGNOSIS — G8929 Other chronic pain: Secondary | ICD-10-CM | POA: Diagnosis present

## 2016-06-22 DIAGNOSIS — F17200 Nicotine dependence, unspecified, uncomplicated: Secondary | ICD-10-CM | POA: Diagnosis not present

## 2016-06-22 DIAGNOSIS — Z96642 Presence of left artificial hip joint: Secondary | ICD-10-CM | POA: Diagnosis not present

## 2016-06-22 DIAGNOSIS — Z7982 Long term (current) use of aspirin: Secondary | ICD-10-CM | POA: Insufficient documentation

## 2016-06-22 DIAGNOSIS — E782 Mixed hyperlipidemia: Secondary | ICD-10-CM

## 2016-06-22 DIAGNOSIS — F1010 Alcohol abuse, uncomplicated: Secondary | ICD-10-CM | POA: Diagnosis present

## 2016-06-22 DIAGNOSIS — M25552 Pain in left hip: Secondary | ICD-10-CM | POA: Insufficient documentation

## 2016-06-22 DIAGNOSIS — Y901 Blood alcohol level of 20-39 mg/100 ml: Secondary | ICD-10-CM | POA: Diagnosis not present

## 2016-06-22 DIAGNOSIS — I10 Essential (primary) hypertension: Secondary | ICD-10-CM | POA: Diagnosis not present

## 2016-06-22 DIAGNOSIS — H5461 Unqualified visual loss, right eye, normal vision left eye: Secondary | ICD-10-CM | POA: Diagnosis not present

## 2016-06-22 DIAGNOSIS — E785 Hyperlipidemia, unspecified: Secondary | ICD-10-CM | POA: Diagnosis not present

## 2016-06-22 DIAGNOSIS — F1210 Cannabis abuse, uncomplicated: Secondary | ICD-10-CM | POA: Insufficient documentation

## 2016-06-22 DIAGNOSIS — E871 Hypo-osmolality and hyponatremia: Secondary | ICD-10-CM | POA: Diagnosis present

## 2016-06-22 DIAGNOSIS — G459 Transient cerebral ischemic attack, unspecified: Principal | ICD-10-CM | POA: Diagnosis present

## 2016-06-22 DIAGNOSIS — R0682 Tachypnea, not elsewhere classified: Secondary | ICD-10-CM

## 2016-06-22 DIAGNOSIS — R270 Ataxia, unspecified: Secondary | ICD-10-CM | POA: Diagnosis present

## 2016-06-22 DIAGNOSIS — H5440 Blindness, one eye, unspecified eye: Secondary | ICD-10-CM

## 2016-06-22 LAB — I-STAT TROPONIN, ED: Troponin i, poc: 0 ng/mL (ref 0.00–0.08)

## 2016-06-22 LAB — DIFFERENTIAL
Basophils Absolute: 0 10*3/uL (ref 0.0–0.1)
Basophils Relative: 1 %
Eosinophils Absolute: 0.1 10*3/uL (ref 0.0–0.7)
Eosinophils Relative: 1 %
Lymphocytes Relative: 36 %
Lymphs Abs: 2.1 10*3/uL (ref 0.7–4.0)
Monocytes Absolute: 0.7 10*3/uL (ref 0.1–1.0)
Monocytes Relative: 11 %
Neutro Abs: 3 10*3/uL (ref 1.7–7.7)
Neutrophils Relative %: 51 %

## 2016-06-22 LAB — I-STAT CHEM 8, ED
BUN: 10 mg/dL (ref 6–20)
Calcium, Ion: 1.31 mmol/L (ref 1.15–1.40)
Chloride: 104 mmol/L (ref 101–111)
Creatinine, Ser: 0.9 mg/dL (ref 0.61–1.24)
Glucose, Bld: 91 mg/dL (ref 65–99)
HCT: 43 % (ref 39.0–52.0)
Hemoglobin: 14.6 g/dL (ref 13.0–17.0)
Potassium: 4.5 mmol/L (ref 3.5–5.1)
Sodium: 135 mmol/L (ref 135–145)
TCO2: 21 mmol/L (ref 0–100)

## 2016-06-22 LAB — URINALYSIS, ROUTINE W REFLEX MICROSCOPIC
Bilirubin Urine: NEGATIVE
Glucose, UA: NEGATIVE mg/dL
Hgb urine dipstick: NEGATIVE
Ketones, ur: NEGATIVE mg/dL
Leukocytes, UA: NEGATIVE
Nitrite: NEGATIVE
Protein, ur: NEGATIVE mg/dL
Specific Gravity, Urine: 1.025 (ref 1.005–1.030)
pH: 5.5 (ref 5.0–8.0)

## 2016-06-22 LAB — PROTIME-INR
INR: 1.06
Prothrombin Time: 13.8 seconds (ref 11.4–15.2)

## 2016-06-22 LAB — COMPREHENSIVE METABOLIC PANEL
ALT: 49 U/L (ref 17–63)
AST: 63 U/L — ABNORMAL HIGH (ref 15–41)
Albumin: 3.6 g/dL (ref 3.5–5.0)
Alkaline Phosphatase: 47 U/L (ref 38–126)
Anion gap: 10 (ref 5–15)
BUN: 9 mg/dL (ref 6–20)
CO2: 19 mmol/L — ABNORMAL LOW (ref 22–32)
Calcium: 10.4 mg/dL — ABNORMAL HIGH (ref 8.9–10.3)
Chloride: 102 mmol/L (ref 101–111)
Creatinine, Ser: 0.97 mg/dL (ref 0.61–1.24)
GFR calc Af Amer: >60 mL/min (ref >60)
GFR calc non Af Amer: >60 mL/min (ref >60)
Glucose, Bld: 89 mg/dL (ref 65–99)
Potassium: 4.5 mmol/L (ref 3.5–5.1)
Sodium: 131 mmol/L — ABNORMAL LOW (ref 135–145)
Total Bilirubin: 0.9 mg/dL (ref 0.3–1.2)
Total Protein: 7.5 g/dL (ref 6.5–8.1)

## 2016-06-22 LAB — CBC
HCT: 39.7 % (ref 39.0–52.0)
Hemoglobin: 13.2 g/dL (ref 13.0–17.0)
MCH: 31.5 pg (ref 26.0–34.0)
MCHC: 33.2 g/dL (ref 30.0–36.0)
MCV: 94.7 fL (ref 78.0–100.0)
Platelets: 196 10*3/uL (ref 150–400)
RBC: 4.19 MIL/uL — ABNORMAL LOW (ref 4.22–5.81)
RDW: 13.5 % (ref 11.5–15.5)
WBC: 5.9 10*3/uL (ref 4.0–10.5)

## 2016-06-22 LAB — RAPID URINE DRUG SCREEN, HOSP PERFORMED
Amphetamines: NOT DETECTED
Barbiturates: NOT DETECTED
Benzodiazepines: NOT DETECTED
Cocaine: NOT DETECTED
Opiates: NOT DETECTED
Tetrahydrocannabinol: POSITIVE — AB

## 2016-06-22 LAB — APTT: aPTT: 32 seconds (ref 24–36)

## 2016-06-22 LAB — ETHANOL: Alcohol, Ethyl (B): 22 mg/dL — ABNORMAL HIGH (ref <5)

## 2016-06-22 MED ORDER — CYCLOBENZAPRINE HCL 10 MG PO TABS
10.0000 mg | ORAL_TABLET | Freq: Once | ORAL | Status: AC
  Administered 2016-06-22: 10 mg via ORAL
  Filled 2016-06-22: qty 1

## 2016-06-22 NOTE — ED Notes (Signed)
Patient transported to CT 

## 2016-06-22 NOTE — ED Triage Notes (Signed)
Pt presents from home with [facility] for gait abnormality and LEFT hip weakness/pain; pt states chronic pain in LEFT hip and lower back; pt denies new injury; no facial droop, speech abnormality, or sensory deficit noted; pt reports drinking 2 beers as per usual; family called 911 because they were concerned about his gait; pt states he has an appt this week regarding his LEFT hip; hx of LEFT hip arthroplasty; VSS per EMS; PIV per EMS; no meds given by EMS

## 2016-06-22 NOTE — ED Notes (Addendum)
Pt ambulated in hallway with cane assistive device; gait was steady; pt denies dizziness or lightheadedness; pt denies any abnormality in gait (at baseline per pt); pt returned to bed and will continue to monitor
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
## 2016-06-22 NOTE — ED Notes (Signed)
Patient transported to X-ray 

## 2016-06-22 NOTE — ED Notes (Signed)
Jimmye Norman, MD does not want code stroke activation, only code stroke work-up

## 2016-06-23 ENCOUNTER — Observation Stay (HOSPITAL_COMMUNITY): Payer: Medicare Other

## 2016-06-23 ENCOUNTER — Encounter (HOSPITAL_COMMUNITY): Payer: Self-pay | Admitting: Radiology

## 2016-06-23 DIAGNOSIS — E785 Hyperlipidemia, unspecified: Secondary | ICD-10-CM

## 2016-06-23 DIAGNOSIS — M25552 Pain in left hip: Secondary | ICD-10-CM

## 2016-06-23 DIAGNOSIS — G8929 Other chronic pain: Secondary | ICD-10-CM | POA: Diagnosis not present

## 2016-06-23 DIAGNOSIS — E782 Mixed hyperlipidemia: Secondary | ICD-10-CM

## 2016-06-23 DIAGNOSIS — I10 Essential (primary) hypertension: Secondary | ICD-10-CM | POA: Diagnosis not present

## 2016-06-23 DIAGNOSIS — H5440 Blindness, one eye, unspecified eye: Secondary | ICD-10-CM

## 2016-06-23 DIAGNOSIS — G459 Transient cerebral ischemic attack, unspecified: Secondary | ICD-10-CM | POA: Diagnosis present

## 2016-06-23 DIAGNOSIS — E871 Hypo-osmolality and hyponatremia: Secondary | ICD-10-CM | POA: Diagnosis present

## 2016-06-23 DIAGNOSIS — F1010 Alcohol abuse, uncomplicated: Secondary | ICD-10-CM | POA: Diagnosis present

## 2016-06-23 HISTORY — DX: Transient cerebral ischemic attack, unspecified: G45.9

## 2016-06-23 LAB — BASIC METABOLIC PANEL
Anion gap: 10 (ref 5–15)
BUN: 10 mg/dL (ref 6–20)
CO2: 21 mmol/L — ABNORMAL LOW (ref 22–32)
Calcium: 10.4 mg/dL — ABNORMAL HIGH (ref 8.9–10.3)
Chloride: 104 mmol/L (ref 101–111)
Creatinine, Ser: 0.86 mg/dL (ref 0.61–1.24)
GFR calc Af Amer: >60 mL/min (ref >60)
GFR calc non Af Amer: >60 mL/min (ref >60)
Glucose, Bld: 163 mg/dL — ABNORMAL HIGH (ref 65–99)
Potassium: 3.6 mmol/L (ref 3.5–5.1)
Sodium: 135 mmol/L (ref 135–145)

## 2016-06-23 LAB — CBC
HCT: 40.1 % (ref 39.0–52.0)
Hemoglobin: 13.3 g/dL (ref 13.0–17.0)
MCH: 31.2 pg (ref 26.0–34.0)
MCHC: 33.2 g/dL (ref 30.0–36.0)
MCV: 94.1 fL (ref 78.0–100.0)
Platelets: 214 10*3/uL (ref 150–400)
RBC: 4.26 MIL/uL (ref 4.22–5.81)
RDW: 13.5 % (ref 11.5–15.5)
WBC: 5.5 10*3/uL (ref 4.0–10.5)

## 2016-06-23 LAB — LIPID PANEL
Cholesterol: 170 mg/dL (ref 0–200)
HDL: 38 mg/dL — ABNORMAL LOW (ref >40)
LDL Cholesterol: 107 mg/dL — ABNORMAL HIGH (ref 0–99)
Total CHOL/HDL Ratio: 4.5 RATIO
Triglycerides: 123 mg/dL (ref <150)
VLDL: 25 mg/dL (ref 0–40)

## 2016-06-23 MED ORDER — OXYCODONE-ACETAMINOPHEN 5-325 MG PO TABS: 1.0000 | ORAL_TABLET | Freq: Four times a day (QID) | ORAL | Status: DC | PRN

## 2016-06-23 MED ORDER — LISINOPRIL-HYDROCHLOROTHIAZIDE 20-12.5 MG PO TABS: 1.0000 | ORAL_TABLET | Freq: Every day | ORAL | Status: DC

## 2016-06-23 MED ORDER — STROKE: EARLY STAGES OF RECOVERY BOOK
Freq: Once | Status: DC
  Filled 2016-06-23: qty 1

## 2016-06-23 MED ORDER — ADULT MULTIVITAMIN W/MINERALS CH
1.0000 | ORAL_TABLET | Freq: Every day | ORAL | Status: DC
  Administered 2016-06-23: 1 via ORAL
  Filled 2016-06-23: qty 1

## 2016-06-23 MED ORDER — THIAMINE HCL 100 MG/ML IJ SOLN: 100.0000 mg | Freq: Every day | INTRAMUSCULAR | Status: DC

## 2016-06-23 MED ORDER — LISINOPRIL 20 MG PO TABS
20.0000 mg | ORAL_TABLET | Freq: Every day | ORAL | Status: DC
Start: 2016-06-23 — End: 2016-06-23
  Administered 2016-06-23: 20 mg via ORAL
  Filled 2016-06-23: qty 1

## 2016-06-23 MED ORDER — LORAZEPAM 2 MG/ML IJ SOLN: 1.0000 mg | Freq: Four times a day (QID) | INTRAMUSCULAR | Status: DC | PRN

## 2016-06-23 MED ORDER — SODIUM CHLORIDE 0.9 % IV SOLN
INTRAVENOUS | Status: DC
  Administered 2016-06-23: 03:00:00 via INTRAVENOUS

## 2016-06-23 MED ORDER — IOPAMIDOL (ISOVUE-370) INJECTION 76%
INTRAVENOUS | Status: AC
  Administered 2016-06-23: 50 mL
  Filled 2016-06-23: qty 50

## 2016-06-23 MED ORDER — DOCUSATE SODIUM 100 MG PO CAPS: 100.0000 mg | ORAL_CAPSULE | Freq: Every day | ORAL | Status: DC | PRN

## 2016-06-23 MED ORDER — SILVER SULFADIAZINE 1 % EX CREA: 1.0000 "application " | TOPICAL_CREAM | Freq: Two times a day (BID) | CUTANEOUS | Status: DC

## 2016-06-23 MED ORDER — ATORVASTATIN CALCIUM 10 MG PO TABS: 10.0000 mg | ORAL_TABLET | Freq: Every day | ORAL | Status: DC

## 2016-06-23 MED ORDER — FOLIC ACID 1 MG PO TABS
1.0000 mg | ORAL_TABLET | Freq: Every day | ORAL | Status: DC
  Administered 2016-06-23: 1 mg via ORAL
  Filled 2016-06-23: qty 1

## 2016-06-23 MED ORDER — HYDROCHLOROTHIAZIDE 12.5 MG PO CAPS
12.5000 mg | ORAL_CAPSULE | Freq: Every day | ORAL | Status: DC
  Administered 2016-06-23: 12.5 mg via ORAL
  Filled 2016-06-23: qty 1

## 2016-06-23 MED ORDER — ENOXAPARIN SODIUM 40 MG/0.4ML ~~LOC~~ SOLN
40.0000 mg | Freq: Every day | SUBCUTANEOUS | Status: DC
  Filled 2016-06-23 (×2): qty 0.4

## 2016-06-23 MED ORDER — NICOTINE 21 MG/24HR TD PT24
21.0000 mg | MEDICATED_PATCH | Freq: Every day | TRANSDERMAL | Status: DC
  Administered 2016-06-23: 21 mg via TRANSDERMAL
  Filled 2016-06-23: qty 1

## 2016-06-23 MED ORDER — LORAZEPAM 1 MG PO TABS: 1.0000 mg | ORAL_TABLET | Freq: Four times a day (QID) | ORAL | Status: DC | PRN

## 2016-06-23 MED ORDER — ASPIRIN EC 325 MG PO TBEC
325.0000 mg | DELAYED_RELEASE_TABLET | Freq: Every day | ORAL | Status: DC
  Administered 2016-06-23: 325 mg via ORAL
  Filled 2016-06-23: qty 1

## 2016-06-23 MED ORDER — VITAMIN B-1 100 MG PO TABS
100.0000 mg | ORAL_TABLET | Freq: Every day | ORAL | Status: DC
  Administered 2016-06-23: 100 mg via ORAL
  Filled 2016-06-23: qty 1

## 2016-06-23 NOTE — Plan of Care (Signed)
Problem: Safety: Goal: Ability to remain free from injury will improve Outcome: Progressing Patient states that he has underlying weakness in LLE.  He uses a straight cane as needed   Problem: Activity: Goal: Risk for activity intolerance will decrease Outcome: Progressing Patient able to ambulate in room with no assistance

## 2016-06-23 NOTE — Evaluation (Addendum)
Physical Therapy Evaluation and Discharge Patient Details Name: Calvin [name] MRN: [id number] DOB: 1961-06-25 Today's Date: 06/23/2016   History of Present Illness  55 y.o.malewho does not feel that anything happened at all and that people are making a big deal out of nothing. His daughter states that the patient's brothers stopped by earlier today and found him to be confused. He was consistently stumbling to the left and running into things on his left side. MRI negative PMHx- blind Rt eye, L THR, cervical and back surgery    Clinical Impression  Patient evaluated by Physical Therapy with no further PT needs identified. Patient has a mild limp at baseline due to hip pain. No drift or imbalance noted. PT is signing off. Thank you for this referral.     Follow Up Recommendations No PT follow up    Equipment Recommendations  None recommended by PT    Recommendations for Other Services       Precautions / Restrictions Precautions Precautions: None      Mobility  Bed Mobility Overal bed mobility: Independent                Transfers Overall transfer level: Independent Equipment used: None                Ambulation/Gait Ambulation/Gait assistance: Independent Ambulation Distance (Feet): 200 Feet Assistive device: None Gait Pattern/deviations: WFL(Within Functional Limits);Antalgic (slight limp)   Gait velocity interpretation: at or above normal speed for age/gender    Stairs            Wheelchair Mobility    Modified Rankin (Stroke Patients Only) Modified Rankin (Stroke Patients Only) Pre-Morbid Rankin Score: No significant disability Modified Rankin: No significant disability     Balance Overall balance assessment: Independent                       Rhomberg - Eyes Opened: 30 Rhomberg - Eyes Closed: 30 High level balance activites: Direction changes;Turns;Sudden stops;Head turns High Level Balance Comments: no imbalance  noted Standardized Balance Assessment Standardized Balance Assessment : Dynamic Gait Index   Dynamic Gait Index Level Surface: Normal Change in Gait Speed: Normal Gait with Horizontal Head Turns: Normal Gait with Vertical Head Turns: Normal Gait and Pivot Turn: Normal Step Over Obstacle: Normal Step Around Obstacles: Normal Steps:  (deferred due to pt has no steps)       Pertinent Vitals/Pain Pain Assessment: No/denies pain    Home Living Family/patient expects to be discharged to:: Private residence Living Arrangements: Other relatives (brother) Available Help at Discharge: Family;Available 24 hours/day   Home Access: Level entry     Home Layout: One level Home Equipment: Cane - single point      Prior Function Level of Independence: Independent with assistive device(s)         Comments: no device for walking indoors; outdoors takes his cane as unlevel ground increases his hip pain     Hand Dominance        Extremity/Trunk Assessment   Upper Extremity Assessment: Overall WFL for tasks assessed;Defer to OT evaluation           Lower Extremity Assessment: Overall WFL for tasks assessed      Cervical / Trunk Assessment: Normal  Communication   Communication: No difficulties  Cognition Arousal/Alertness: Awake/alert Behavior During Therapy: WFL for tasks assessed/performed Overall Cognitive Status: Within Functional Limits for tasks assessed  General Comments      Exercises     Assessment/Plan    PT Assessment Patent does not need any further PT services  PT Problem List            PT Treatment Interventions      PT Goals (Current goals can be found in the Care Plan section)  Acute Rehab PT Goals Patient Stated Goal: return home today PT Goal Formulation: All assessment and education complete, DC therapy    Frequency     Barriers to discharge        Co-evaluation               End of Session  Equipment Utilized During Treatment: Gait belt Activity Tolerance: Patient tolerated treatment well Patient left: in chair;with call bell/phone within reach Nurse Communication: Mobility status    Functional Assessment Tool Used: clinical judgement Functional Limitation: Mobility: Walking and moving around Mobility: Walking and Moving Around Current Status [id number]): At least 1 percent but less than 20 percent impaired, limited or restricted Mobility: Walking and Moving Around Goal Status [phone]): At least 1 percent but less than 20 percent impaired, limited or restricted Mobility: Walking and Moving Around Discharge Status [phone]): At least 1 percent but less than 20 percent impaired, limited or restricted    Time: 1057-1108 PT Time Calculation (min) (ACUTE ONLY): 11 min   Charges:   PT Evaluation $PT Eval Low Complexity: 1 Procedure     PT G Codes:   PT G-Codes **NOT FOR INPATIENT CLASS** Functional Assessment Tool Used: clinical judgement Functional Limitation: Mobility: Walking and moving around Mobility: Walking and Moving Around Current Status [id number]): At least 1 percent but less than 20 percent impaired, limited or restricted Mobility: Walking and Moving Around Goal Status [phone]): At least 1 percent but less than 20 percent impaired, limited or restricted Mobility: Walking and Moving Around Discharge Status [phone]): At least 1 percent but less than 20 percent impaired, limited or restricted    Berna Gitto 06/23/2016, 11:22 AM Pager [phone]

## 2016-06-23 NOTE — ED Notes (Signed)
Pt given [place] sandwich at RN and MD's request.

## 2016-06-23 NOTE — Consult Note (Signed)
Neurology Consultation Reason for Consult: TIA Referring Physician: Ralene Bathe, E  CC: transient left sided problems.   History is obtained from:patient, daughter  HPI: Calvin [name] is a 55 y.o. male who does not feel that anything happened at all and that people are makgin a big deal out of nothing.   His daughter states that the patient's brothers stopped by earlier today and found him to be confused. He was consistently stumbling to the left and running into things on his left side. Due to their concerns, they called his daughter who came over and observed the same behavior. Due to concerns for a stroke they brought him to the hospital. He improved en route and is currently back to his baseline.   He did have two beers earlier, but this is not unusual for him and family does not think this would cause his symptoms  LKW: Unclear tpa given?: no, resolve symptoms    ROS: A 14 point ROS was performed and is negative except as noted in the HPI.   Past Medical History:  Diagnosis Date  . Chronic left hip pain   . Hypertension      Family history: No history of similar   Social History:  reports that he has been smoking.  He has never used smokeless tobacco. He reports that he drinks alcohol. He reports that he does not use drugs.   Exam: Current vital signs: BP 103/81   Pulse 84   Temp 98.7 F (37.1 C)   Resp 24   Ht 5\' 7"  (1.702 m)   Wt 77.1 kg (170 lb)   SpO2 98%   BMI 26.63 kg/m  Vital signs in last 24 hours: Temp:  [98.7 F (37.1 C)] 98.7 F (37.1 C) (10/01 2006) Pulse Rate:  [84-95] 84 (10/01 2300) Resp:  [22-30] 24 (10/01 2300) BP: (96-122)/(72-96) 103/81 (10/01 2300) SpO2:  [97 %-100 %] 98 % (10/01 2300) Weight:  [77.1 kg (170 lb)] 77.1 kg (170 lb) (10/01 2006)   Physical Exam  Constitutional: Appears well-developed and well-nourished.  Psych: Affect appropriate to situation Eyes: No scleral injection HENT: No OP obstrucion Head: Normocephalic.   Cardiovascular: Normal rate and regular rhythm.  Respiratory: Effort normal and breath sounds normal to anterior ascultation GI: Soft.  No distension. There is no tenderness.  Skin: WDI  Neuro: Mental Status: Patient is awake, alert, oriented to person, place, month, year, and situation. Patient is able to give a clear and coherent history. No signs of aphasia or neglect Though he sounds very mildly dysarthric to me, this is apparently normal for him. Cranial Nerves: II: Visual Fields are full in the left eye He is blind in the right eye.   III,IV, VI: EOMI without ptosis or diploplia.  V: Facial sensation is symmetric to temperature VII: Facial movement is symmetric.  VIII: hearing is intact to voice X: Uvula elevates symmetrically XI: Shoulder shrug is symmetric. XII: tongue is midline without atrophy or fasciculations.  Motor: Tone is normal. Bulk is normal. 5/5 strength was present in all four extremities.  Sensory: Sensation is diminished in the left leg, but he states is chronic without change. does not extinguish to double simultaneous stimulation Cerebellar: FNF intact bilaterally  I have reviewed labs in epic and the results pertinent to this consultation are: Mild hyponatremia  I have reviewed the images obtained: CT head-no acute intracranial findings  Impression: 55 year old male with transient left-sided lean and slurred speech without recognition of his deficits. This is  concerning for TIA involving neglect. He has returned to baseline at this time, but I would favor treating it as TIA.  Recommendations: 1. HgbA1c, fasting lipid panel 2. MRI, MRA  of the brain without contrast 3. Frequent neuro checks 4. Echocardiogram 5. Carotid dopplers 6. Prophylactic therapy-Antiplatelet med: Aspirin - dose 325mg  PO or 300mg  PR 7. Risk factor modification 8. Telemetry monitoring 9. PT consult, OT consult, Speech consult 10. please page stroke NP  Or  PA  Or MD  from 8am  -4 pm starting 10/2 as this patient will be followed by the stroke team at this point.   You can look them up on [email or web address]     Roland Rack, MD [facility] [phone]  If 7pm- 7am, please page neurology on call as listed in [place].
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
## 2016-06-23 NOTE — ED Notes (Signed)
Pt a&ox 4; pt has chronic left hip pain which causes unsteady gait; Pt had MRI which appears to be WNL; no neuro deficits noted for this RN; pt able to [place] urinal independently. Pt passed Swallow screen noted by prior RN; Pt was able to ambulated with prior RN

## 2016-06-23 NOTE — Progress Notes (Signed)
Discharge instructions completed with patient.  Patient verbalizes understanding of future appointments as needed, and need of follow up ECHO.  Patient was given smoking ceastation information as well as discussion on how to recognize signs and symptoms of a stroke.  Patient gathered all his clothing, cell phone and wallet.  Awaiting family for transportation at this time.  Patient has no questions or concerns.

## 2016-06-23 NOTE — Care Management Note (Signed)
Case Management Note  Patient Details  Name: Calvin [name] MRN: [id number] Date of Birth: [birth year]  Subjective/Objective:                    Action/Plan: Pt discharging home with self care. No further needs per CM.  Expected Discharge Date:                  Expected Discharge Plan:  Home/Self Care  In-House Referral:     Discharge planning Services     Post Acute Care Choice:    Choice offered to:     DME Arranged:    DME Agency:     HH Arranged:    [place] Agency:     Status of Service:  Completed, signed off  If discussed at [employer] of Stay Meetings, dates discussed:    Additional Comments:  Pollie Friar, RN 06/23/2016, 4:25 PM

## 2016-06-23 NOTE — ED Notes (Signed)
Pt returned from x-ray. Hospitalist at bedside.

## 2016-06-23 NOTE — Discharge Summary (Signed)
Physician Discharge Summary  Calvin [name] [id number] DOB: 16-May-1961 DOA: 06/22/2016  PCP: Meredith Pel, MD  Admit date: 06/22/2016 Discharge date: 06/23/2016   Recommendations for Outpatient Follow-Up:   Outpatient echo Alcohol cessation HgbA1C as outpatient follow up  Discharge Diagnosis:   Principal Problem:   TIA (transient ischemic attack) Active Problems:   Essential hypertension   Alcohol abuse   Chronic left hip pain   Hyponatremia   Discharge disposition:  Home.  SNF:  Discharge Condition: Improved.  Diet recommendation: heart healthy  Wound care: None.   History of Present Illness:   Calvin [name] is a 55 y.o. male with medical history significant of HTN, chronic left hip /back pain, s/p left hip replacement, and tobacco/marjuana/alcohol abuse; who presents with complaints of left sided weakness. Patient's daughter noted acute onset of patient having difficulty word finding, seeming somewhat confused, stumbling into things, and falling towards his left side. At baseline patient uses a cane to ambulate and has a history of left hip replacement. He has appointment to follow-up with orthopedics next week. Symptoms were noted to the last approximately 10 minutes and then spontaneously self resolve. Patient denies these events and  only complains of back and hip pain. Family denies similar symptoms like this in the past. Patient reports smoking marijuana yesterday, but denies smoking marijuana today he reports drinking on average 2 beers per day.   Hospital Course by Problem:   Gait abnormality due to chronic left hip pain s/p anthroplasty  MRI  Negative for infarct  MRA  negative  CTA neck hypoplastic right VA  LDL 107  HgbA1c pending  Diet - low sodium heart healthy   No antithrombotic prior to admission, now on aspirin 325 mg daily. Continue ASA on discharge.   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   Discharge Exam:   Vitals:   06/23/16 0755 06/23/16 1132  BP: 121/81 (!) 141/81  Pulse: 81 87  Resp: 18 18  Temp: 97.7 F (36.5 C) 97.8 F (36.6 C)   Vitals:   06/23/16 0625 06/23/16 0630 06/23/16 0755 06/23/16 1132  BP: 124/84 123/86 121/81 (!) 141/81  Pulse: 82 81 81 87  Resp: 18 19 18 18   Temp:   97.7 F (36.5 C) 97.8 F (36.6 C)  TempSrc:   Oral Oral  SpO2: 100% 99% 100% 100%  Weight:      Height:        Gen:  NAD-ready to go home  The results of significant diagnostics from this hospitalization (including imaging, microbiology, ancillary and laboratory) are listed below for reference.     Procedures and Diagnostic Studies:   Dg Chest 2 View  Result Date: 06/23/2016 CLINICAL DATA:  TIA. EXAM: CHEST  2 VIEW COMPARISON:  02/07/2013 FINDINGS: Normal heart size and pulmonary vascularity. No focal airspace disease or consolidation in the lungs. No blunting of costophrenic angles. No pneumothorax. Mediastinal contours appear intact. Degenerative changes in the spine. Old resection or resorption of the distal clavicles with old osseous fragments. IMPRESSION: No active cardiopulmonary disease. Electronically Signed   By: Lucienne Capers M.D.   On: 06/23/2016 01:51   Ct Head Wo Contrast  Result  Date: 06/22/2016 CLINICAL DATA:  Transient confusion and difficulty walking. Leaning towards the left. Left hip weakness and pain. EXAM: CT HEAD WITHOUT CONTRAST TECHNIQUE: Contiguous axial images were obtained from the base of the skull through the vertex without intravenous contrast. COMPARISON:  CT face 05/15/2008 FINDINGS: Brain: No evidence of acute infarction, hemorrhage, hydrocephalus, extra-axial collection or mass lesion/mass effect. Vascular: No hyperdense vessel or unexpected calcification. Skull: Normal. Negative for fracture or focal lesion. Sinuses/Orbits: Old deformity of the right medial orbital wall consistent with old fracture deformity. Calcification within the right globe the could represent displaced lesions or possibly a foreign body. Correlation with ophthalmoscopic examination recommended. Mild mucosal thickening in the paranasal sinuses. No acute air-fluid levels. Mastoid air cells are not opacified. Other: None. IMPRESSION: No acute intracranial abnormalities. Incidental note of calcification versus foreign body in the right globe. Direct visualization suggested. Electronically Signed   By: Lucienne Capers M.D.   On: 06/22/2016 22:27   Ct Angio Neck W Or Wo Contrast  Result Date: 06/23/2016 CLINICAL DATA:  Confusion, stumbling to the LEFT. Transient LEFT-sided weakness and slurred speech. EXAM: CT ANGIOGRAPHY NECK TECHNIQUE: Multidetector CT imaging of the neck was performed using the standard protocol during bolus administration of intravenous contrast. Multiplanar CT image reconstructions and MIPs were obtained to evaluate the vascular anatomy. Carotid stenosis measurements (when applicable) are obtained utilizing NASCET criteria, using the distal internal carotid diameter as the denominator. CONTRAST:  Isovue 370, 50 mL. COMPARISON:  MRI brain earlier today. FINDINGS: Aortic arch: Bovine trunk. Imaged portion shows no evidence of aneurysm or dissection. No significant stenosis of  the major arch vessel origins. Right carotid system: Mild non stenotic plaque at the RIGHT ICA origin. No evidence of dissection, stenosis (50% or greater) or occlusion. Left carotid system: Minimal non stenotic plaque at the LEFT ICA origin. No evidence of dissection, stenosis (50% or greater) or occlusion. Vertebral arteries: LEFT dominant. RIGHT vertebral appears congenitally hypoplastic, with a small calcific ostial plaque at its origin. No proximal lesion on the LEFT. Skeleton: Unremarkable. Other neck: No masses Upper chest: No pneumothorax or nodule. Mild bullous change at the apices. IMPRESSION: No extracranial stenosis or dissection. Minor non stenotic atheromatous change at the carotid bifurcations. Congenitally small RIGHT vertebral, not appearing to be clinically significant given the lack of ischemia in the posterior circulation on earlier MR. Electronically Signed   By: Staci Righter M.D.   On: 06/23/2016 11:01   Mr Brain Wo Contrast  Result Date: 06/23/2016 CLINICAL DATA:  55 y/o M; transient left-sided weakness slurred speech without recognition of deficits. EXAM: MRI HEAD WITHOUT CONTRAST MRA HEAD WITHOUT CONTRAST TECHNIQUE: Multiplanar, multiecho pulse sequences of the brain and surrounding structures were obtained without intravenous contrast. Angiographic images of the head were obtained using MRA technique without contrast. COMPARISON:  06/22/2016 CT head.  05/15/2008 maxillofacial CT. FINDINGS: MRI HEAD FINDINGS Brain: No acute infarction, hemorrhage, hydrocephalus, extra-axial collection or mass lesion. Nonspecific foci of T2 FLAIR hyperintensity in subcortical and periventricular white matter with frontal and parietal predominance are consistent with mild chronic microvascular  ischemic changes. Mild parenchymal volume loss. Vascular: Normal arterial flow voids. Increased T2 signal within right sigmoid sinus with normal flow void both upstream and downstream is probably related to slow  flow. Skull and upper cervical spine: Normal marrow signal. Sinuses/Orbits: Minimal opacification of right mastoid tip. Mild diffuse paranasal sinus mucosal thickening and left maxillary sinus mucous retention cyst. Chronic medial buckling of the right lamina papyracea. The left lobe is unremarkable. There are membranes within the right globe and a low signal structure in the renal corresponding to abnormal density on CT within the anterior vitreous compartment. Other: None. MRA HEAD FINDINGS Internal carotid arteries:  Patent. Anterior cerebral arteries:  Patent. Middle cerebral arteries: Patent. Anterior communicating artery: Not identified, likely hypoplastic or absent Posterior communicating arteries: Not identified, likely hypoplastic or absent. Posterior cerebral arteries:  Patent. Basilar artery:  Patent. Vertebral arteries: Diminutive right V4 segment. Large left vertebral artery. Left PICC and right AICA/ PICA noted. No evidence of high-grade stenosis, large vessel occlusion, or aneurysm unless noted above. IMPRESSION: 1. No evidence of acute/ early subacute infarct or intracranial hemorrhage. Mild parenchymal volume loss and chronic microvascular ischemic changes. 2. Right globe findings suspicious for retinal detachment and lens dislocation, age indeterminate, ophthalmologic exam recommended. 3. No occlusion, aneurysm, or significant stenosis of the circle of Willis is identified. These results will be called to the ordering clinician or representative by the Radiologist Assistant, and communication documented in the PACS or zVision Dashboard. Electronically Signed   By: Kristine Garbe M.D.   On: 06/23/2016 06:09   Mr Jodene Nam Head/brain [id number] Cm  Result Date: 06/23/2016 CLINICAL DATA:  55 y/o M; transient left-sided weakness slurred speech without recognition of deficits. EXAM: MRI HEAD WITHOUT CONTRAST MRA HEAD WITHOUT CONTRAST TECHNIQUE: Multiplanar, multiecho pulse sequences of the brain and  surrounding structures were obtained without intravenous contrast. Angiographic images of the head were obtained using MRA technique without contrast. COMPARISON:  06/22/2016 CT head.  05/15/2008 maxillofacial CT. FINDINGS: MRI HEAD FINDINGS Brain: No acute infarction, hemorrhage, hydrocephalus, extra-axial collection or mass lesion. Nonspecific foci of T2 FLAIR hyperintensity in subcortical and periventricular white matter with frontal and parietal predominance are consistent with mild chronic microvascular ischemic changes. Mild parenchymal volume loss. Vascular: Normal arterial flow voids. Increased T2 signal within right sigmoid sinus with normal flow void both upstream and downstream is probably related to slow flow. Skull and upper cervical spine: Normal marrow signal. Sinuses/Orbits: Minimal opacification of right mastoid tip. Mild diffuse paranasal sinus mucosal thickening and left maxillary sinus mucous retention cyst. Chronic medial buckling of the right lamina papyracea. The left lobe is unremarkable. There are membranes within the right globe and a low signal structure in the renal corresponding to abnormal density on CT within the anterior vitreous compartment. Other: None. MRA HEAD FINDINGS Internal carotid arteries:  Patent. Anterior cerebral arteries:  Patent. Middle cerebral arteries: Patent. Anterior communicating artery: Not identified, likely hypoplastic or absent Posterior communicating arteries: Not identified, likely hypoplastic or absent. Posterior cerebral arteries:  Patent. Basilar artery:  Patent. Vertebral arteries: Diminutive right V4 segment. Large left vertebral artery. Left PICC and right AICA/ PICA noted. No evidence of high-grade stenosis, large vessel occlusion, or aneurysm unless noted above. IMPRESSION: 1. No evidence of acute/ early subacute infarct or intracranial hemorrhage. Mild parenchymal volume loss and chronic microvascular ischemic changes. 2. Right globe findings  suspicious for retinal detachment and lens dislocation, age indeterminate, ophthalmologic exam recommended. 3. No occlusion, aneurysm, or significant stenosis of the circle of  Jannifer Franklin is identified. These results will be called to the ordering clinician or representative by the Radiologist Assistant, and communication documented in the PACS or zVision Dashboard. Electronically Signed   By: Kristine Garbe M.D.   On: 06/23/2016 06:09   Dg Hip Unilat With Pelvis 2-3 Views Left  Result Date: 06/22/2016 CLINICAL DATA:  Left hip pain and weakness worsening over the last 2 weeks. EXAM: DG HIP (WITH OR WITHOUT PELVIS) 2-3V LEFT COMPARISON:  06/25/2012 FINDINGS: Postoperative changes with left total hip arthroplasty using non cemented femoral component. Since the previous study, there has been revision of the acetabular and femoral components. No evidence of acute fracture or dislocation in the left hip. Slight lucency at the superior bone-cement interface of the acetabular component may indicate early loosening. Pelvis appears intact. SI joints and symphysis pubis are not displaced. Degenerative changes in the lower lumbar spine and in right hip. Vascular calcifications. IMPRESSION: Postoperative left hip arthroplasty with revision since previous study. Slight lucency at the superior bone-cement interface of the acetabular component may indicate early loosening. No acute fracture or dislocation. Electronically Signed   By: Lucienne Capers M.D.   On: 06/22/2016 21:29     Labs:   Basic Metabolic Panel:  Recent Labs Lab 06/22/16 2136 06/22/16 2141 06/23/16 0409  NA 131* 135 135  K 4.5 4.5 3.6  CL 102 104 104  CO2 19*  --  21*  GLUCOSE 89 91 163*  BUN 9 10 10   CREATININE 0.97 0.90 0.86  CALCIUM 10.4*  --  10.4*   GFR Estimated Creatinine Clearance: 89.7 mL/min (by C-G formula based on SCr of 0.86 mg/dL). Liver Function Tests:  Recent Labs Lab 06/22/16 2136  AST 63*  ALT 49  ALKPHOS  47  BILITOT 0.9  PROT 7.5  ALBUMIN 3.6   No results for input(s): LIPASE, AMYLASE in the last 168 hours. No results for input(s): AMMONIA in the last 168 hours. Coagulation profile  Recent Labs Lab 06/22/16 2136  INR 1.06    CBC:  Recent Labs Lab 06/22/16 2136 06/22/16 2141 06/23/16 0409  WBC 5.9  --  5.5  NEUTROABS 3.0  --   --   HGB 13.2 14.6 13.3  HCT 39.7 43.0 40.1  MCV 94.7  --  94.1  PLT 196  --  214   Cardiac Enzymes: No results for input(s): CKTOTAL, CKMB, CKMBINDEX, TROPONINI in the last 168 hours. BNP: Invalid input(s): POCBNP CBG: No results for input(s): GLUCAP in the last 168 hours. D-Dimer No results for input(s): DDIMER in the last 72 hours. Hgb A1c No results for input(s): HGBA1C in the last 72 hours. Lipid Profile  Recent Labs  06/23/16 0409  CHOL 170  HDL 38*  LDLCALC 107*  TRIG 123  CHOLHDL 4.5   Thyroid function studies No results for input(s): TSH, T4TOTAL, T3FREE, THYROIDAB in the last 72 hours.  Invalid input(s): FREET3 Anemia work up No results for input(s): VITAMINB12, FOLATE, FERRITIN, TIBC, IRON, RETICCTPCT in the last 72 hours. Microbiology No results found for this or any previous visit (from the past 240 hour(s)).   Discharge Instructions:   Discharge Instructions    Diet - low sodium heart healthy    Complete by:  As directed    Discharge instructions    Complete by:  As directed    Stop alcohol Outpatient echo   Increase activity slowly    Complete by:  As directed        Medication List  STOP taking these medications   zolpidem 10 MG tablet Commonly known as:  AMBIEN     TAKE these medications   amLODipine 10 MG tablet Commonly known as:  NORVASC Take 10 mg by mouth daily.   aspirin 325 MG EC tablet Take 325 mg by mouth daily.   docusate sodium 100 MG capsule Commonly known as:  COLACE Take 100 mg by mouth daily as needed for constipation.   lisinopril-hydrochlorothiazide 20-12.5 MG  tablet Commonly known as:  PRINZIDE,ZESTORETIC Take 1 tablet by mouth daily.   oxyCODONE-acetaminophen 5-325 MG tablet Commonly known as:  PERCOCET/ROXICET Take 1 tablet by mouth every 6 (six) hours as needed for pain.      Follow-up Information    Meredith Pel, MD .   Specialty:  Orthopedic Surgery Why:  as needed Contact information: [place] [id number] [phone]            Time coordinating discharge: 35 min  Signed:  Angline Schweigert U Arihanna Estabrook   [facility] 06/23/2016, 12:08 PM

## 2016-06-23 NOTE — Care Management Obs Status (Signed)
[facility] NOTIFICATION   Patient Details  Name: Calvin [name] MRN: [id number] Date of Birth: 1961/01/26   Medicare Observation Status Notification Given:  Yes    Pollie Friar, RN 06/23/2016, 1:31 PM

## 2016-06-23 NOTE — ED Notes (Signed)
Patient transported to X-ray 

## 2016-06-23 NOTE — Progress Notes (Signed)
STROKE TEAM PROGRESS NOTE   SUBJECTIVE (INTERVAL HISTORY) No family is at the bedside.  Overall he feels his condition is at his baseline. He stated that his brother and two daughters were too cautious and sent him her. He drank 2 beers yesterday morning and did not use his cane for walking at home. He still has left hip pain after surgery. He seemed leaning towards left on walking which was due to his left hip pain but he was sent over here to evaluate for stroke.    OBJECTIVE Temp:  [97.7 F (36.5 C)-98.7 F (37.1 C)] 97.8 F (36.6 C) (10/02 1132) Pulse Rate:  [81-97] 87 (10/02 1132) Cardiac Rhythm: Normal sinus rhythm (10/02 0810) Resp:  [14-30] 18 (10/02 1132) BP: (96-141)/(66-96) 141/81 (10/02 1132) SpO2:  [97 %-100 %] 100 % (10/02 1132) Weight:  [170 lb (77.1 kg)] 170 lb (77.1 kg) (10/01 2006)  No results for input(s): GLUCAP in the last 168 hours.  Recent Labs Lab 06/22/16 2136 06/22/16 2141 06/23/16 0409  NA 131* 135 135  K 4.5 4.5 3.6  CL 102 104 104  CO2 19*  --  21*  GLUCOSE 89 91 163*  BUN 9 10 10   CREATININE 0.97 0.90 0.86  CALCIUM 10.4*  --  10.4*    Recent Labs Lab 06/22/16 2136  AST 63*  ALT 49  ALKPHOS 47  BILITOT 0.9  PROT 7.5  ALBUMIN 3.6    Recent Labs Lab 06/22/16 2136 06/22/16 2141 06/23/16 0409  WBC 5.9  --  5.5  NEUTROABS 3.0  --   --   HGB 13.2 14.6 13.3  HCT 39.7 43.0 40.1  MCV 94.7  --  94.1  PLT 196  --  214   No results for input(s): CKTOTAL, CKMB, CKMBINDEX, TROPONINI in the last 168 hours.  Recent Labs  06/22/16 2136  LABPROT 13.8  INR 1.06    Recent Labs  06/22/16 2149  COLORURINE YELLOW  LABSPEC 1.025  PHURINE 5.5  GLUCOSEU NEGATIVE  HGBUR NEGATIVE  BILIRUBINUR NEGATIVE  KETONESUR NEGATIVE  PROTEINUR NEGATIVE  NITRITE NEGATIVE  LEUKOCYTESUR NEGATIVE       Component Value Date/Time   CHOL 170 06/23/2016 0409   TRIG 123 06/23/2016 0409   HDL 38 (L) 06/23/2016 0409   CHOLHDL 4.5 06/23/2016 0409   VLDL  25 06/23/2016 0409   LDLCALC 107 (H) 06/23/2016 0409   No results found for: HGBA1C    Component Value Date/Time   LABOPIA NONE DETECTED 06/22/2016 2149   COCAINSCRNUR NONE DETECTED 06/22/2016 2149   LABBENZ NONE DETECTED 06/22/2016 2149   AMPHETMU NONE DETECTED 06/22/2016 2149   THCU POSITIVE (A) 06/22/2016 2149   LABBARB NONE DETECTED 06/22/2016 2149     Recent Labs Lab 06/22/16 2136  ETH 22*    I have personally reviewed the radiological images below and agree with the radiology interpretations.  Ct Head Wo Contrast 06/22/2016 CLINICAL DATA:  Transient confusion and difficulty walking. Leaning towards the left. Left hip weakness and pain. EXAM: CT HEAD WITHOUT CONTRAST TECHNIQUE: Contiguous axial images were obtained from the base of the skull through the vertex without intravenous contrast. COMPARISON:  CT face 05/15/2008 FINDINGS: Brain: No evidence of acute infarction, hemorrhage, hydrocephalus, extra-axial collection or mass lesion/mass effect. Vascular: No hyperdense vessel or unexpected calcification. Skull: Normal. Negative for fracture or focal lesion. Sinuses/Orbits: Old deformity of the right medial orbital wall consistent with old fracture deformity. Calcification within the right globe the could represent displaced lesions or  possibly a foreign body. Correlation with ophthalmoscopic examination recommended. Mild mucosal thickening in the paranasal sinuses. No acute air-fluid levels. Mastoid air cells are not opacified. Other: None. IMPRESSION: No acute intracranial abnormalities. Incidental note of calcification versus foreign body in the right globe. Direct visualization suggested.   Ct Angio Neck W Or Wo Contrast 06/23/2016 IMPRESSION: No extracranial stenosis or dissection. Minor non stenotic atheromatous change at the carotid bifurcations. Congenitally small RIGHT vertebral, not appearing to be clinically significant given the lack of ischemia in the posterior circulation  on earlier MR.   Mri and Mra Head/brain Wo Cm 06/23/2016 IMPRESSION: 1. No evidence of acute/ early subacute infarct or intracranial hemorrhage. Mild parenchymal volume loss and chronic microvascular ischemic changes. 2. Right globe findings suspicious for retinal detachment and lens dislocation, age indeterminate, ophthalmologic exam recommended. 3. No occlusion, aneurysm, or significant stenosis of the circle of Willis is identified.   Dg Hip Unilat With Pelvis 2-3 Views Left 06/22/2016 IMPRESSION: Postoperative left hip arthroplasty with revision since previous study. Slight lucency at the superior bone-cement interface of the acetabular component may indicate early loosening. No acute fracture or dislocation.    PHYSICAL EXAM  Temp:  [97.7 F (36.5 C)-98.7 F (37.1 C)] 97.8 F (36.6 C) (10/02 1132) Pulse Rate:  [81-97] 87 (10/02 1132) Resp:  [14-30] 18 (10/02 1132) BP: (96-141)/(66-96) 141/81 (10/02 1132) SpO2:  [97 %-100 %] 100 % (10/02 1132) Weight:  [170 lb (77.1 kg)] 170 lb (77.1 kg) (10/01 2006)  General - Well nourished, well developed, in no apparent distress.  Ophthalmologic - Sharp disc margins OS, fundi not visualized OD due to corneal clouding.  Cardiovascular - Regular rate and rhythm with no murmur.  Neck - supple, no carotid bruits  Mental Status -  Level of arousal and orientation to time, place, and person were intact. Language including expression, naming, repetition, comprehension was assessed and found intact. Fund of Knowledge was assessed and was intact.  Cranial Nerves II - XII - II - Visual field intact, right eye blind (chronic). III, IV, VI - Extraocular movements intact. V - Facial sensation intact bilaterally. VII - Facial movement intact bilaterally. VIII - Hearing & vestibular intact bilaterally. X - Palate elevates symmetrically. XI - Chin turning & shoulder shrug intact bilaterally. XII - Tongue protrusion intact.  Motor Strength - The  patient's strength was normal in all extremities except left LE 4+/5 proximally (chronic) and pronator drift was absent.  Bulk was normal and fasciculations were absent.   Motor Tone - Muscle tone was assessed at the neck and appendages and was normal.  Reflexes - The patient's reflexes were symmetrical in all extremities and he had no pathological reflexes.  Sensory - Light touch, temperature/pinprick were assessed and were symmetrical.    Coordination - The patient had normal movements in the hands with no ataxia or dysmetria.  Tremor was absent.  Gait and Station - deferred due to safety concerns.   ASSESSMENT/PLAN [name] is a 54 y.o. male with history of HTN and smoker admitted for abnormal gait.    Gait abnormality due to chronic left hip pain s/p anthroplasty  MRI  Negative for infarct  MRA  negative  CTA neck hypoplastic right VA  LDL 107  HgbA1c pending  lovenox for VTE prophylaxis  Diet Heart Room service appropriate? Yes; Fluid consistency: Thin  Diet - low sodium heart healthy   No antithrombotic prior to admission, now on aspirin 325 mg daily. Continue ASA on  discharge.   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  MRI also showed right eye abnormality  Chronic   Hypertension  Home meds:   Amlodipine, lisinopril and HCTZ Currently on lisinopril and HCTZ  Stable  Patient counseled to be compliant with his blood pressure medications  Hyperlipidemia  Home meds:  none   LDL 107, goal < 100  Add lipitor 10mg   Continue statin at discharge  Tobacco abuse  Current smoker  Smoking cessation counseling provided  Nicotine patch provided  Pt is willing to quit  Other Stroke Risk Factors  ETOH use - counseled on limitation to 2 drinks per day  Other  Active Problems  hyperglycemia  Other Pertinent History    Hospital day # 0   Neurology will sign off. Please call with questions. No neuro follow up needed at this time. Thanks for the consult.   Rosalin Hawking, MD PhD Stroke Neurology 06/23/2016 12:58 PM    To contact Stroke Continuity provider, please refer to [email or web address] After hours, contact General Neurology

## 2016-06-24 LAB — HEMOGLOBIN A1C
Hgb A1c MFr Bld: 5.7 % — ABNORMAL HIGH (ref 4.8–5.6)
Mean Plasma Glucose: 117 mg/dL

## 2016-09-06 ENCOUNTER — Emergency Department (HOSPITAL_COMMUNITY): Payer: Medicare Other

## 2016-09-06 ENCOUNTER — Emergency Department (HOSPITAL_COMMUNITY)
Admission: EM | Admit: 2016-09-06 | Discharge: 2016-09-06 | Disposition: A | Discharge status: Home / Self Care | Payer: Medicare Other | Attending: Emergency Medicine | Admitting: Emergency Medicine

## 2016-09-06 ENCOUNTER — Encounter (HOSPITAL_COMMUNITY): Payer: Self-pay | Admitting: Emergency Medicine

## 2016-09-06 DIAGNOSIS — F17200 Nicotine dependence, unspecified, uncomplicated: Secondary | ICD-10-CM | POA: Diagnosis not present

## 2016-09-06 DIAGNOSIS — Z7982 Long term (current) use of aspirin: Secondary | ICD-10-CM | POA: Diagnosis not present

## 2016-09-06 DIAGNOSIS — Y929 Unspecified place or not applicable: Secondary | ICD-10-CM | POA: Diagnosis not present

## 2016-09-06 DIAGNOSIS — Y999 Unspecified external cause status: Secondary | ICD-10-CM | POA: Insufficient documentation

## 2016-09-06 DIAGNOSIS — I10 Essential (primary) hypertension: Secondary | ICD-10-CM | POA: Diagnosis not present

## 2016-09-06 DIAGNOSIS — W19XXXA Unspecified fall, initial encounter: Secondary | ICD-10-CM

## 2016-09-06 DIAGNOSIS — Z8673 Personal history of transient ischemic attack (TIA), and cerebral infarction without residual deficits: Secondary | ICD-10-CM | POA: Diagnosis not present

## 2016-09-06 DIAGNOSIS — M546 Pain in thoracic spine: Secondary | ICD-10-CM | POA: Diagnosis not present

## 2016-09-06 DIAGNOSIS — W1839XA Other fall on same level, initial encounter: Secondary | ICD-10-CM | POA: Diagnosis not present

## 2016-09-06 DIAGNOSIS — Y9301 Activity, walking, marching and hiking: Secondary | ICD-10-CM | POA: Diagnosis not present

## 2016-09-06 DIAGNOSIS — S299XXA Unspecified injury of thorax, initial encounter: Secondary | ICD-10-CM | POA: Insufficient documentation

## 2016-09-06 DIAGNOSIS — Z79899 Other long term (current) drug therapy: Secondary | ICD-10-CM | POA: Diagnosis not present

## 2016-09-06 MED ORDER — LIDOCAINE 5 % EX PTCH: 1.0000 | MEDICATED_PATCH | CUTANEOUS | 0 refills | Status: DC

## 2016-09-06 MED ORDER — METHOCARBAMOL 500 MG PO TABS
1000.0000 mg | ORAL_TABLET | Freq: Once | ORAL | Status: AC
  Administered 2016-09-06: 1000 mg via ORAL
  Filled 2016-09-06: qty 2

## 2016-09-06 MED ORDER — MELOXICAM 7.5 MG PO TABS: 7.5000 mg | ORAL_TABLET | Freq: Every day | ORAL | 0 refills | Status: DC | PRN

## 2016-09-06 MED ORDER — METHOCARBAMOL 500 MG PO TABS: 500.0000 mg | ORAL_TABLET | Freq: Four times a day (QID) | ORAL | 0 refills | Status: DC | PRN

## 2016-09-06 NOTE — Discharge Instructions (Signed)
Read the information below.  Use the prescribed medication as directed.  Please discuss all new medications with your pharmacist.  You may return to the Emergency Department at any time for worsening condition or any new symptoms that concern you.   If you develop worsening back pain, shortness of breath, cough, fever, you pass out, or become weak or dizzy, return to the ER for a recheck.

## 2016-09-06 NOTE — ED Provider Notes (Signed)
[facility] DEPT Provider Note   CSN: [id number] Arrival date & time: 09/06/16  1137     History   Chief Complaint Chief Complaint  Patient presents with  . Fall  . Back Pain  . Hip Pain  . Rib Injury    HPI Calvin [name] is a 55 y.o. male.  HPI   Pt with hx right eye blindness, chronic left hip pain s/p hip replacement, ETOH abuse, HTN, TIA, walks with cane p/w pain in right posterior ribs after losing his balance and falling two days ago.  States it is not unusual for him to lose his balance like that given his medical conditions, thought he fell on his right side.  Since the day he fell he has had persistent pain in the right posterior ribs, without improvement with mineral oil (applied topically), exacerbated with cough and palpation.  Denies any other pain anywhere, any SOB or increased coughing, abdominal pain.   Denies any syncope, dizziness, or weakness causing the fall.    Past Medical History:  Diagnosis Date  . Chronic left hip pain   . Hypertension     Patient Active Problem List   Diagnosis Date Noted  . TIA (transient ischemic attack) 06/23/2016  . Essential hypertension 06/23/2016  . Alcohol abuse 06/23/2016  . Chronic left hip pain 06/23/2016  . Hyponatremia 06/23/2016  . Mixed hyperlipidemia   . Blind right eye     Past Surgical History:  Procedure Laterality Date  . BACK SURGERY    . CERVICAL SPINE SURGERY    . JOINT REPLACEMENT         Home Medications    Prior to Admission medications   Medication Sig Start Date End Date Taking? Authorizing Provider  amLODipine (NORVASC) 10 MG tablet Take 10 mg by mouth daily.    Historical Provider, MD  aspirin 325 MG EC tablet Take 325 mg by mouth daily.    Historical Provider, MD  docusate sodium (COLACE) 100 MG capsule Take 100 mg by mouth daily as needed for constipation.    Historical Provider, MD  lisinopril-hydrochlorothiazide (PRINZIDE,ZESTORETIC) 20-12.5 MG per tablet Take 1 tablet by  mouth daily.    Historical Provider, MD  oxyCODONE-acetaminophen (PERCOCET/ROXICET) 5-325 MG per tablet Take 1 tablet by mouth every 6 (six) hours as needed for pain. 03/13/13   Carmin Muskrat, MD    Family History No family history on file.  Social History Social History  Substance Use Topics  . Smoking status: Current Every Day Smoker  . Smokeless tobacco: Never Used  . Alcohol use Yes     Allergies   Patient has no known allergies.   Review of Systems Review of Systems  All other systems reviewed and are negative.    Physical Exam Updated Vital Signs BP 124/85   Pulse 108   Temp 98.5 F (36.9 C) (Oral)   Resp 16   SpO2 100%   Physical Exam  Constitutional: He appears well-developed and well-nourished. No distress.  HENT:  Head: Normocephalic and atraumatic.  Neck: Neck supple.  Cardiovascular: Normal rate and regular rhythm.   Pulmonary/Chest: Effort normal and breath sounds normal. No respiratory distress. He has no wheezes. He has no rales.      Abdominal: Soft. He exhibits no distension and no mass. There is no tenderness. There is no rebound and no guarding.  Neurological: He is alert. He exhibits normal muscle tone.  Skin: He is not diaphoretic.  Nursing note and vitals reviewed.  ED Treatments / Results  Labs (all labs ordered are listed, but only abnormal results are displayed) Labs Reviewed - No data to display  EKG  EKG Interpretation None       Radiology No results found.  Procedures Procedures (including critical care time)  Medications Ordered in ED Medications - No data to display   Initial Impression / Assessment and Plan / ED Course  I have reviewed the triage vital signs and the nursing notes.  Pertinent labs & imaging results that were available during my care of the patient were reviewed by me and considered in my medical decision making (see chart for details).  Clinical Course     Afebrile, nontoxic patient  with right thoracic back pain following fall two days ago.  Fall was typical for him, no concerning features.  Not dizziness, not syncope.  Xray is negative.  Suspect muscle strain and spasm vs less likely contusion or occult rib fracture.   D/C home with pain medication, encouragement to do deep breathing to decrease risk of pneumonia, PCP follow up, return precautions.  Discussed result, findings, treatment, and follow up  with patient.  Pt given return precautions.  Pt verbalizes understanding and agrees with plan.       Final Clinical Impressions(s) / ED Diagnoses   Final diagnoses:  Acute right-sided thoracic back pain  Fall, initial encounter    New Prescriptions Discharge Medication List as of 09/06/2016  2:12 PM    START taking these medications   Details  lidocaine (LIDODERM) 5 % Place 1 patch onto the skin daily. Remove & Discard patch within 12 hours or as directed by MD, Starting Sat 09/06/2016, Print    meloxicam (MOBIC) 7.5 MG tablet Take 1 tablet (7.5 mg total) by mouth daily as needed for pain., Starting Sat 09/06/2016, Print    methocarbamol (ROBAXIN) 500 MG tablet Take 1-2 tablets (500-1,000 mg total) by mouth every 6 (six) hours as needed for muscle spasms (pain)., Starting Sat 09/06/2016, Print         [place], [place] 09/06/16 1556    Gareth Morgan, MD 09/07/16 1300

## 2016-09-06 NOTE — ED Triage Notes (Signed)
Pt. Stated, I was walking and lost my balance . C/o rib pain, back pain.

## 2017-07-08 ENCOUNTER — Encounter: Payer: Self-pay | Admitting: Specialist

## 2018-02-15 ENCOUNTER — Encounter (HOSPITAL_COMMUNITY): Payer: Self-pay

## 2018-02-15 ENCOUNTER — Emergency Department (HOSPITAL_COMMUNITY): Payer: Medicare Other

## 2018-02-15 ENCOUNTER — Other Ambulatory Visit: Payer: Self-pay

## 2018-02-15 ENCOUNTER — Emergency Department (HOSPITAL_COMMUNITY)
Admission: EM | Admit: 2018-02-15 | Discharge: 2018-02-15 | Disposition: A | Discharge status: Home / Self Care | Payer: Medicare Other | Attending: Emergency Medicine | Admitting: Emergency Medicine

## 2018-02-15 DIAGNOSIS — I10 Essential (primary) hypertension: Secondary | ICD-10-CM | POA: Insufficient documentation

## 2018-02-15 DIAGNOSIS — F17210 Nicotine dependence, cigarettes, uncomplicated: Secondary | ICD-10-CM | POA: Diagnosis not present

## 2018-02-15 DIAGNOSIS — Z7982 Long term (current) use of aspirin: Secondary | ICD-10-CM | POA: Insufficient documentation

## 2018-02-15 DIAGNOSIS — Z79899 Other long term (current) drug therapy: Secondary | ICD-10-CM | POA: Diagnosis not present

## 2018-02-15 DIAGNOSIS — R0789 Other chest pain: Secondary | ICD-10-CM | POA: Insufficient documentation

## 2018-02-15 DIAGNOSIS — R079 Chest pain, unspecified: Secondary | ICD-10-CM | POA: Diagnosis present

## 2018-02-15 LAB — BASIC METABOLIC PANEL
Anion gap: 8 (ref 5–15)
BUN: 10 mg/dL (ref 6–20)
CO2: 24 mmol/L (ref 22–32)
Calcium: 10.4 mg/dL — ABNORMAL HIGH (ref 8.9–10.3)
Chloride: 103 mmol/L (ref 101–111)
Creatinine, Ser: 0.86 mg/dL (ref 0.61–1.24)
GFR calc Af Amer: >60 mL/min (ref >60)
GFR calc non Af Amer: >60 mL/min (ref >60)
Glucose, Bld: 135 mg/dL — ABNORMAL HIGH (ref 65–99)
Potassium: 5 mmol/L (ref 3.5–5.1)
Sodium: 135 mmol/L (ref 135–145)

## 2018-02-15 LAB — CBC
HCT: 40.1 % (ref 39.0–52.0)
Hemoglobin: 13.7 g/dL (ref 13.0–17.0)
MCH: 32.2 pg (ref 26.0–34.0)
MCHC: 34.2 g/dL (ref 30.0–36.0)
MCV: 94.4 fL (ref 78.0–100.0)
Platelets: 161 K/uL (ref 150–400)
RBC: 4.25 MIL/uL (ref 4.22–5.81)
RDW: 13.2 % (ref 11.5–15.5)
WBC: 4.2 K/uL (ref 4.0–10.5)

## 2018-02-15 LAB — I-STAT TROPONIN, ED
Troponin i, poc: 0 ng/mL (ref 0.00–0.08)
Troponin i, poc: 0 ng/mL (ref 0.00–0.08)

## 2018-02-15 LAB — D-DIMER, QUANTITATIVE (NOT AT ARMC): D-Dimer, Quant: 1.93 ug/mL-FEU — ABNORMAL HIGH (ref 0.00–0.50)

## 2018-02-15 MED ORDER — IOPAMIDOL (ISOVUE-370) INJECTION 76%
INTRAVENOUS | Status: AC
  Filled 2018-02-15: qty 100

## 2018-02-15 MED ORDER — IOPAMIDOL (ISOVUE-370) INJECTION 76%
100.0000 mL | Freq: Once | INTRAVENOUS | Status: AC | PRN
  Administered 2018-02-15: 100 mL via INTRAVENOUS

## 2018-02-15 MED ORDER — OXYCODONE-ACETAMINOPHEN 5-325 MG PO TABS
1.0000 | ORAL_TABLET | Freq: Once | ORAL | Status: AC
  Administered 2018-02-15: 1 via ORAL
  Filled 2018-02-15: qty 1

## 2018-02-15 NOTE — ED Triage Notes (Signed)
Pt presents for evaluation of L sided CP. Pt reports he had syncopal episode last Monday. Reports has been having cp since then. Pt is ambulatory with cane.

## 2018-02-15 NOTE — Discharge Instructions (Signed)
Your ECG today is not normal with some nonspecific changes.  While there is no clear evidence of a heart attack, there is some concern that you have some heart disease.  We discussed coming into the hospital.  While you are declining this, it is very important that you follow-up very closely with your primary care physician and if you change your mind or any symptoms worsen or continue then you need to come back into the hospital for evaluation.

## 2018-02-15 NOTE — ED Notes (Signed)
Patient transported to CT 

## 2018-02-15 NOTE — ED Provider Notes (Signed)
[facility] EMERGENCY DEPARTMENT Provider Note   CSN: [id number] Arrival date & time: 02/15/18  1303     History   Chief Complaint Chief Complaint  Patient presents with  . Chest Pain    HPI Calvin [name] is a 57 y.o. male.  HPI  57 year old male with history of hypertension and chronic left hip pain presents with left-sided chest pain.  Is mostly a sharp pain.  It has been going on for about a week but due to it not going away he came in for evaluation.  Hurts most when he coughs or laughs.  However it is also a constant sensation.  He states that he fell the day before the pain started and he was dizzy.  EMS came and did orthostatics which were positive and told him he was dehydrated but he did not want to come to the ER.  Since then no further dizziness and he is wondering if he injured himself during the fall.  Has been taking his home Percocet with partial relief.  No vomiting.  Occasionally has shortness of breath.  Past Medical History:  Diagnosis Date  . Chronic left hip pain   . Hypertension     Patient Active Problem List   Diagnosis Date Noted  . TIA (transient ischemic attack) 06/23/2016  . Essential hypertension 06/23/2016  . Alcohol abuse 06/23/2016  . Chronic left hip pain 06/23/2016  . Hyponatremia 06/23/2016  . Mixed hyperlipidemia   . Blind right eye     Past Surgical History:  Procedure Laterality Date  . BACK SURGERY    . CERVICAL SPINE SURGERY    . JOINT REPLACEMENT          Home Medications    Prior to Admission medications   Medication Sig Start Date End Date Taking? Authorizing Provider  amLODipine (NORVASC) 10 MG tablet Take 10 mg by mouth daily.   Yes [provider]  aspirin 325 MG EC tablet Take 325 mg by mouth daily.   Yes [provider]  lisinopril-hydrochlorothiazide (PRINZIDE,ZESTORETIC) 20-12.5 MG per tablet Take 1 tablet by mouth daily.   Yes [provider]    oxyCODONE-acetaminophen (PERCOCET/ROXICET) 5-325 MG per tablet Take 1 tablet by mouth every 6 (six) hours as needed for pain. 03/13/13  Yes Carmin Muskrat, MD  lidocaine (LIDODERM) 5 % Place 1 patch onto the skin daily. Remove & Discard patch within 12 hours or as directed by MD Patient not taking: Reported on 02/15/2018 09/06/16   Clayton Bibles, PA-C    Family History No family history on file.  Social History Social History   Tobacco Use  . Smoking status: Current Every Day Smoker  . Smokeless tobacco: Never Used  Substance Use Topics  . Alcohol use: Yes  . Drug use: No     Allergies   Patient has no known allergies.   Review of Systems Review of Systems  Respiratory: Positive for shortness of breath.   Cardiovascular: Positive for chest pain.  Gastrointestinal: Negative for abdominal pain and vomiting.  Musculoskeletal: Positive for arthralgias (chronic left hip pain). Negative for back pain.  Neurological: Negative for dizziness and headaches.  All other systems reviewed and are negative.    Physical Exam Updated Vital Signs BP (!) 140/99   Pulse 84   Temp 98.2 F (36.8 C) (Oral)   Resp 20   Ht 5\' 7"  (1.702 m)   Wt 79.4 kg (175 lb)   SpO2 98%   BMI 27.41  kg/m   Physical Exam  Constitutional: He is oriented to person, place, and time. He appears well-developed and well-nourished.  HENT:  Head: Normocephalic and atraumatic.  Right Ear: External ear normal.  Left Ear: External ear normal.  Nose: Nose normal.  Eyes: Pupils are equal, round, and reactive to light. EOM are normal. Right eye exhibits no discharge. Left eye exhibits no discharge.  Neck: Neck supple.  Cardiovascular: Normal rate, regular rhythm, normal heart sounds and intact distal pulses.  Pulses:      Radial pulses are 2+ on the right side, and 2+ on the left side.  Pulmonary/Chest: Effort normal and breath sounds normal. He exhibits tenderness.    Abdominal: Soft. There is no tenderness.   Musculoskeletal: He exhibits no edema.  Neurological: He is alert and oriented to person, place, and time.  CN 3-12 grossly intact. 5/5 strength in all 4 extremities. Grossly normal sensation. Normal finger to nose.   Skin: Skin is warm and dry.  Nursing note and vitals reviewed.    ED Treatments / Results  Labs (all labs ordered are listed, but only abnormal results are displayed) Labs Reviewed  BASIC METABOLIC PANEL - Abnormal; Notable for the following components:      Result Value   Glucose, Bld 135 (*)    Calcium 10.4 (*)    All other components within normal limits  D-DIMER, QUANTITATIVE (NOT AT [facility]) - Abnormal; Notable for the following components:   D-Dimer, Quant 1.93 (*)    All other components within normal limits  CBC  I-STAT TROPONIN, ED  I-STAT TROPONIN, ED    EKG EKG Interpretation  Date/Time:  Monday Feb 15 2018 13:17:55 EDT Ventricular Rate:  102 PR Interval:  122 QRS Duration: 86 QT Interval:  318 QTC Calculation: 414 R Axis:   26 Text Interpretation:  Sinus tachycardia Nonspecific T wave abnormality Abnormal ECG T wave changes new since 2017 Confirmed by Sherwood Gambler [phone]) on 02/15/2018 2:48:24 PM  EKG Interpretation  Date/Time:  Monday Feb 15 2018 16:19:48 EDT Ventricular Rate:  85 PR Interval:  122 QRS Duration: 85 QT Interval:  343 QTC Calculation: 408 R Axis:   28 Text Interpretation:  Normal sinus rhythm nonspecific T waves no longer present compared to earlier today Confirmed by Sherwood Gambler [phone]) on 02/15/2018 6:02:34 PM   Radiology Dg Chest 2 View  Result Date: 02/15/2018 CLINICAL DATA:  Left chest pain.  Smoker. EXAM: CHEST - 2 VIEW COMPARISON:  09/06/2016. FINDINGS: Normal sized heart. Clear lungs. Mild diffuse peribronchial thickening and mild hyperexpansion of the lungs. Minimal thoracic spine degenerative changes and mild scoliosis. Old posttraumatic or postsurgical changes involving the distal clavicles. IMPRESSION:  Stable mild changes of COPD and chronic bronchitis. No acute abnormality. Electronically Signed   By: Claudie Revering M.D.   On: 02/15/2018 14:13   Ct Angio Chest Pe W/cm &/or Wo Cm  Result Date: 02/15/2018 CLINICAL DATA:  Left-sided chest pain EXAM: CT ANGIOGRAPHY CHEST WITH CONTRAST TECHNIQUE: Multidetector CT imaging of the chest was performed using the standard protocol during bolus administration of intravenous contrast. Multiplanar CT image reconstructions and MIPs were obtained to evaluate the vascular anatomy. CONTRAST:  149mL ISOVUE-370 IOPAMIDOL (ISOVUE-370) INJECTION 76% COMPARISON:  None. FINDINGS: Cardiovascular: Satisfactory opacification of the pulmonary arteries to the segmental level. No evidence of pulmonary embolism. Normal heart size. No pericardial effusion. Mediastinum/Nodes: No enlarged mediastinal, hilar, or axillary lymph nodes. Thyroid gland, trachea, and esophagus demonstrate no significant findings. Lungs/Pleura: Lungs are clear. No  pleural effusion or pneumothorax. Upper Abdomen: No acute abnormality.  Small hiatal hernia. Musculoskeletal: No chest wall abnormality. No acute or significant osseous findings. Review of the MIP images confirms the above findings. IMPRESSION: No acute intracranial pathology. Electronically Signed   By: Kathreen Devoid   On: 02/15/2018 17:20    Procedures Procedures (including critical care time)  Medications Ordered in ED Medications  oxyCODONE-acetaminophen (PERCOCET/ROXICET) 5-325 MG per tablet 1 tablet (1 tablet Oral Given 02/15/18 1540)  iopamidol (ISOVUE-370) 76 % injection 100 mL (100 mLs Intravenous Contrast Given 02/15/18 1645)     Initial Impression / Assessment and Plan / ED Course  I have reviewed the triage vital signs and the nursing notes.  Pertinent labs & imaging results that were available during my care of the patient were reviewed by me and considered in my medical decision making (see chart for details).     The patient's  pain is most likely chest wall, given the point tenderness and symptoms for about 1 week.  However he is noted to have some nonspecific T wave changes.  This could be rate related as his first ECG was tachycardic.  Repeat ECG shows no further T wave changes.  I discussed this with the patient and that these nonspecific changes in addition to his age, hypertension, and smoking could represent underlying coronary disease.  However he states he wants to go home.  I think this is probably reasonable as well but we did discuss that he is at higher risk for coronary disease.  His work-up for PE is negative.  I offered admission/observation but he declines.  He understands coronary disease could lead to heart attack or death.  Family would like him to stay but he declines.  I discussed the importance of following up closely with his primary care physician.  Discharge home with return precautions.  Final Clinical Impressions(s) / ED Diagnoses   Final diagnoses:  Chest wall pain    ED Discharge Orders    None       Sherwood Gambler, MD 02/15/18 2151

## 2018-08-21 ENCOUNTER — Emergency Department (HOSPITAL_COMMUNITY): Payer: Medicare Other

## 2018-08-21 ENCOUNTER — Emergency Department (HOSPITAL_COMMUNITY)
Admission: EM | Admit: 2018-08-21 | Discharge: 2018-08-21 | Disposition: A | Discharge status: Home / Self Care | Payer: Medicare Other | Attending: Emergency Medicine | Admitting: Emergency Medicine

## 2018-08-21 ENCOUNTER — Encounter (HOSPITAL_COMMUNITY): Payer: Self-pay

## 2018-08-21 ENCOUNTER — Other Ambulatory Visit: Payer: Self-pay

## 2018-08-21 DIAGNOSIS — R2681 Unsteadiness on feet: Secondary | ICD-10-CM

## 2018-08-21 DIAGNOSIS — F17200 Nicotine dependence, unspecified, uncomplicated: Secondary | ICD-10-CM | POA: Insufficient documentation

## 2018-08-21 DIAGNOSIS — R251 Tremor, unspecified: Secondary | ICD-10-CM | POA: Diagnosis not present

## 2018-08-21 DIAGNOSIS — I10 Essential (primary) hypertension: Secondary | ICD-10-CM | POA: Insufficient documentation

## 2018-08-21 DIAGNOSIS — F10920 Alcohol use, unspecified with intoxication, uncomplicated: Secondary | ICD-10-CM

## 2018-08-21 DIAGNOSIS — Z79899 Other long term (current) drug therapy: Secondary | ICD-10-CM | POA: Diagnosis not present

## 2018-08-21 DIAGNOSIS — Z7982 Long term (current) use of aspirin: Secondary | ICD-10-CM | POA: Insufficient documentation

## 2018-08-21 DIAGNOSIS — W19XXXA Unspecified fall, initial encounter: Secondary | ICD-10-CM | POA: Diagnosis not present

## 2018-08-21 DIAGNOSIS — Z8673 Personal history of transient ischemic attack (TIA), and cerebral infarction without residual deficits: Secondary | ICD-10-CM | POA: Diagnosis not present

## 2018-08-21 DIAGNOSIS — M545 Low back pain: Secondary | ICD-10-CM | POA: Diagnosis present

## 2018-08-21 LAB — CBC WITH DIFFERENTIAL/PLATELET
Abs Immature Granulocytes: 0.02 10*3/uL (ref 0.00–0.07)
Basophils Absolute: 0.1 10*3/uL (ref 0.0–0.1)
Basophils Relative: 1 %
Eosinophils Absolute: 0 10*3/uL (ref 0.0–0.5)
Eosinophils Relative: 0 %
HCT: 37.5 % — ABNORMAL LOW (ref 39.0–52.0)
Hemoglobin: 12.8 g/dL — ABNORMAL LOW (ref 13.0–17.0)
Immature Granulocytes: 1 %
Lymphocytes Relative: 28 %
Lymphs Abs: 1.2 10*3/uL (ref 0.7–4.0)
MCH: 32.9 pg (ref 26.0–34.0)
MCHC: 34.1 g/dL (ref 30.0–36.0)
MCV: 96.4 fL (ref 80.0–100.0)
Monocytes Absolute: 0.5 10*3/uL (ref 0.1–1.0)
Monocytes Relative: 12 %
Neutro Abs: 2.6 10*3/uL (ref 1.7–7.7)
Neutrophils Relative %: 58 %
Platelets: 74 10*3/uL — ABNORMAL LOW (ref 150–400)
RBC: 3.89 MIL/uL — ABNORMAL LOW (ref 4.22–5.81)
RDW: 14.4 % (ref 11.5–15.5)
WBC: 4.4 10*3/uL (ref 4.0–10.5)
nRBC: 0 % (ref 0.0–0.2)

## 2018-08-21 LAB — URINALYSIS, ROUTINE W REFLEX MICROSCOPIC
Bacteria, UA: NONE SEEN
Glucose, UA: NEGATIVE mg/dL
Ketones, ur: NEGATIVE mg/dL
Leukocytes, UA: NEGATIVE
Nitrite: NEGATIVE
Protein, ur: 30 mg/dL — AB
Specific Gravity, Urine: 1.024 (ref 1.005–1.030)
pH: 5 (ref 5.0–8.0)

## 2018-08-21 LAB — COMPREHENSIVE METABOLIC PANEL
ALT: 63 U/L — ABNORMAL HIGH (ref 0–44)
AST: 184 U/L — ABNORMAL HIGH (ref 15–41)
Albumin: 4 g/dL (ref 3.5–5.0)
Alkaline Phosphatase: 51 U/L (ref 38–126)
Anion gap: 13 (ref 5–15)
BUN: 19 mg/dL (ref 6–20)
CO2: 20 mmol/L — ABNORMAL LOW (ref 22–32)
Calcium: 10.2 mg/dL (ref 8.9–10.3)
Chloride: 99 mmol/L (ref 98–111)
Creatinine, Ser: 0.96 mg/dL (ref 0.61–1.24)
GFR calc Af Amer: >60 mL/min (ref >60)
GFR calc non Af Amer: >60 mL/min (ref >60)
Glucose, Bld: 143 mg/dL — ABNORMAL HIGH (ref 70–99)
Potassium: 3.6 mmol/L (ref 3.5–5.1)
Sodium: 132 mmol/L — ABNORMAL LOW (ref 135–145)
Total Bilirubin: 1.5 mg/dL — ABNORMAL HIGH (ref 0.3–1.2)
Total Protein: 8.4 g/dL — ABNORMAL HIGH (ref 6.5–8.1)

## 2018-08-21 LAB — AMMONIA: Ammonia: 41 umol/L — ABNORMAL HIGH (ref 9–35)

## 2018-08-21 LAB — ETHANOL: Alcohol, Ethyl (B): 204 mg/dL — ABNORMAL HIGH (ref <10)

## 2018-08-21 NOTE — ED Provider Notes (Signed)
[facility] DEPT Provider Note   CSN: [id number] Arrival date & time: 08/21/18  1557     History   Chief Complaint Chief Complaint  Patient presents with  . Fall  . Back Pain    HPI Calvin [name] is a 57 y.o. male.  Patient is a 57 year old male with past medical history of hypertension, alcohol abuse, chronic left hip and back pain, and TIA.  He presents today for evaluation of weakness, unsteady gait, and multiple falls.  This is been occurring over the past several weeks.  The family member present at bedside states that he has been more shaky and unsteady.  He occasionally repeats himself and refers to family members by the wrong name.  Patient denies any fevers or chills.  He denies any headache or visual disturbances.  There are no aggravating or alleviating factors.  The history is provided by the patient.    Past Medical History:  Diagnosis Date  . Chronic left hip pain   . Hypertension     Patient Active Problem List   Diagnosis Date Noted  . TIA (transient ischemic attack) 06/23/2016  . Essential hypertension 06/23/2016  . Alcohol abuse 06/23/2016  . Chronic left hip pain 06/23/2016  . Hyponatremia 06/23/2016  . Mixed hyperlipidemia   . Blind right eye     Past Surgical History:  Procedure Laterality Date  . BACK SURGERY    . CERVICAL SPINE SURGERY    . JOINT REPLACEMENT          Home Medications    Prior to Admission medications   Medication Sig Start Date End Date Taking? Authorizing Provider  amLODipine (NORVASC) 10 MG tablet Take 10 mg by mouth daily.    [provider]  aspirin 325 MG EC tablet Take 325 mg by mouth daily.    [provider]  lidocaine (LIDODERM) 5 % Place 1 patch onto the skin daily. Remove & Discard patch within 12 hours or as directed by MD Patient not taking: Reported on 02/15/2018 09/06/16   Clayton Bibles, PA-C  lisinopril-hydrochlorothiazide (PRINZIDE,ZESTORETIC) 20-12.5  MG per tablet Take 1 tablet by mouth daily.    [provider]  oxyCODONE-acetaminophen (PERCOCET/ROXICET) 5-325 MG per tablet Take 1 tablet by mouth every 6 (six) hours as needed for pain. 03/13/13   Carmin Muskrat, MD    Family History History reviewed. No pertinent family history.  Social History Social History   Tobacco Use  . Smoking status: Current Every Day Smoker  . Smokeless tobacco: Never Used  Substance Use Topics  . Alcohol use: Yes  . Drug use: No     Allergies   Patient has no known allergies.   Review of Systems Review of Systems  All other systems reviewed and are negative.    Physical Exam Updated Vital Signs There were no vitals taken for this visit.  Physical Exam  Constitutional: He is oriented to person, place, and time. He appears well-developed and well-nourished. No distress.  Patient is a 57 year old male in no acute distress.  The odor of alcohol is present.  HENT:  Head: Normocephalic and atraumatic.  Mouth/Throat: Oropharynx is clear and moist.  Neck: Normal range of motion. Neck supple.  Cardiovascular: Normal rate and regular rhythm. Exam reveals no friction rub.  No murmur heard. Pulmonary/Chest: Effort normal and breath sounds normal. No respiratory distress. He has no wheezes. He has no rales.  Abdominal: Soft. Bowel sounds are normal. He exhibits no distension. There  is no tenderness.  Musculoskeletal: Normal range of motion. He exhibits no edema.  Neurological: He is alert and oriented to person, place, and time. No cranial nerve deficit. He exhibits normal muscle tone. Coordination abnormal.  Patient moves all 4 extremities, however slowly and with a tremor noted.  He responds appropriately to commands.  Skin: Skin is warm and dry. He is not diaphoretic.  Nursing note and vitals reviewed.    ED Treatments / Results  Labs (all labs ordered are listed, but only abnormal results are displayed) Labs Reviewed  ETHANOL    COMPREHENSIVE METABOLIC PANEL  CBC WITH DIFFERENTIAL/PLATELET  AMMONIA  URINALYSIS, ROUTINE W REFLEX MICROSCOPIC    EKG ED ECG REPORT   Date: 08/21/2018  Rate: 92  Rhythm: normal sinus rhythm  QRS Axis: normal  Intervals: normal  ST/T Wave abnormalities: nonspecific T wave changes  Conduction Disutrbances:none  Narrative Interpretation:   Old EKG Reviewed: none available  I have personally reviewed the EKG tracing and agree with the computerized printout as noted.   Radiology No results found.  Procedures Procedures (including critical care time)  Medications Ordered in ED Medications - No data to display   Initial Impression / Assessment and Plan / ED Course  I have reviewed the triage vital signs and the nursing notes.  Pertinent labs & imaging results that were available during my care of the patient were reviewed by me and considered in my medical decision making (see chart for details).  Patient is a 57 year old male with medical history as described in the HPI presenting with complaints of recent falls and difficulty ambulating.  The daughter states that he is having shakiness as well.  On exam, the patient is somewhat slow to respond and exhibits some tremor, but is otherwise neurologically intact.  Patient's work-up reveals atrophy on head CT and x-ray showing no acute process of the lumbar spine and hip.  His laboratory studies show mild elevations of his liver functions and ammonia level of 41.  I suspect that this is related to his chronic alcohol consumption.  He tells me that he had one beer today, however his alcohol level is 204.  I suspect this patient's instability is related to his alcohol consumption and possibly the beginning of a movement disorder, possibly Parkinson's disease based on his tremor.  There is no hypertension and no tachycardia it would suggest withdrawal.  It was brought to my attention that the patient's daughter was very angry about  the patient's wait time.  He was seen almost immediately upon arrival, however laboratory studies, CT scan, and x-ray readings seem to take longer than what she thought was acceptable.  I am told she was on the phone walking throughout the emergency department attempting to contact hospital administration to file a complaint.  I was unable to locate her when I explained the results of the patient's tests.  I explained the results to him and he is adamant about leaving.  He will be given his discharge papers.  I will advise him to follow-up with neurology.  Final Clinical Impressions(s) / ED Diagnoses   Final diagnoses:  None    ED Discharge Orders    None       Veryl Speak, MD 08/21/18 2023

## 2018-08-21 NOTE — Discharge Instructions (Signed)
Call [facility] neurology to arrange a follow-up appointment.  The contact information has been provided in this discharge summary for you to call and make these arrangements.

## 2018-08-21 NOTE — ED Triage Notes (Signed)
Pt reports increased falls since this summer with the last being on Thanksgiving. Pt states that he came because his back hurts, his chronic L hip pain is bothering him, and his brother told him to get a head CT. He walks with a cane. His daughter drove him here, but is not present in triage. A&Ox4.

## 2018-08-21 NOTE — ED Notes (Signed)
Pt unable with wait.

## 2018-10-27 ENCOUNTER — Encounter

## 2018-10-27 ENCOUNTER — Telehealth: Payer: Self-pay | Admitting: Neurology

## 2018-10-27 ENCOUNTER — Ambulatory Visit: Payer: Medicare Other | Admitting: Neurology

## 2018-10-27 NOTE — Telephone Encounter (Signed)
This patient did not show for a new patient point today.

## 2018-10-28 ENCOUNTER — Encounter: Payer: Self-pay | Admitting: Neurology

## 2019-07-15 ENCOUNTER — Emergency Department (HOSPITAL_COMMUNITY): Payer: Medicare Other

## 2019-07-15 ENCOUNTER — Other Ambulatory Visit: Payer: Self-pay

## 2019-07-15 ENCOUNTER — Inpatient Hospital Stay (HOSPITAL_COMMUNITY)
Admission: EM | Admit: 2019-07-15 | Discharge: 2019-07-17 | DRG: 433 | Disposition: A | Discharge status: Home / Self Care | Payer: Medicare Other | Attending: Family Medicine | Admitting: Family Medicine

## 2019-07-15 DIAGNOSIS — K7011 Alcoholic hepatitis with ascites: Secondary | ICD-10-CM | POA: Diagnosis present

## 2019-07-15 DIAGNOSIS — K7460 Unspecified cirrhosis of liver: Secondary | ICD-10-CM | POA: Diagnosis not present

## 2019-07-15 DIAGNOSIS — Y900 Blood alcohol level of less than 20 mg/100 ml: Secondary | ICD-10-CM | POA: Diagnosis present

## 2019-07-15 DIAGNOSIS — K7040 Alcoholic hepatic failure without coma: Principal | ICD-10-CM | POA: Diagnosis present

## 2019-07-15 DIAGNOSIS — K766 Portal hypertension: Secondary | ICD-10-CM | POA: Diagnosis present

## 2019-07-15 DIAGNOSIS — F17210 Nicotine dependence, cigarettes, uncomplicated: Secondary | ICD-10-CM | POA: Diagnosis present

## 2019-07-15 DIAGNOSIS — K5900 Constipation, unspecified: Secondary | ICD-10-CM | POA: Diagnosis present

## 2019-07-15 DIAGNOSIS — R1110 Vomiting, unspecified: Secondary | ICD-10-CM | POA: Diagnosis not present

## 2019-07-15 DIAGNOSIS — K7031 Alcoholic cirrhosis of liver with ascites: Secondary | ICD-10-CM

## 2019-07-15 DIAGNOSIS — D638 Anemia in other chronic diseases classified elsewhere: Secondary | ICD-10-CM | POA: Diagnosis present

## 2019-07-15 DIAGNOSIS — Z20828 Contact with and (suspected) exposure to other viral communicable diseases: Secondary | ICD-10-CM | POA: Diagnosis present

## 2019-07-15 DIAGNOSIS — D509 Iron deficiency anemia, unspecified: Secondary | ICD-10-CM | POA: Diagnosis present

## 2019-07-15 DIAGNOSIS — G8929 Other chronic pain: Secondary | ICD-10-CM | POA: Diagnosis present

## 2019-07-15 DIAGNOSIS — Z8673 Personal history of transient ischemic attack (TIA), and cerebral infarction without residual deficits: Secondary | ICD-10-CM

## 2019-07-15 DIAGNOSIS — F10939 Alcohol use, unspecified with withdrawal, unspecified: Secondary | ICD-10-CM

## 2019-07-15 DIAGNOSIS — K7290 Hepatic failure, unspecified without coma: Secondary | ICD-10-CM | POA: Diagnosis not present

## 2019-07-15 DIAGNOSIS — E782 Mixed hyperlipidemia: Secondary | ICD-10-CM | POA: Diagnosis present

## 2019-07-15 DIAGNOSIS — B1920 Unspecified viral hepatitis C without hepatic coma: Secondary | ICD-10-CM | POA: Diagnosis present

## 2019-07-15 DIAGNOSIS — H5461 Unqualified visual loss, right eye, normal vision left eye: Secondary | ICD-10-CM | POA: Diagnosis present

## 2019-07-15 DIAGNOSIS — R0602 Shortness of breath: Secondary | ICD-10-CM | POA: Diagnosis present

## 2019-07-15 DIAGNOSIS — I10 Essential (primary) hypertension: Secondary | ICD-10-CM | POA: Diagnosis present

## 2019-07-15 DIAGNOSIS — F10239 Alcohol dependence with withdrawal, unspecified: Secondary | ICD-10-CM

## 2019-07-15 DIAGNOSIS — F1290 Cannabis use, unspecified, uncomplicated: Secondary | ICD-10-CM | POA: Diagnosis present

## 2019-07-15 DIAGNOSIS — R188 Other ascites: Secondary | ICD-10-CM

## 2019-07-15 DIAGNOSIS — N179 Acute kidney failure, unspecified: Secondary | ICD-10-CM | POA: Diagnosis present

## 2019-07-15 LAB — COMPREHENSIVE METABOLIC PANEL
ALT: 25 U/L (ref 0–44)
AST: 107 U/L — ABNORMAL HIGH (ref 15–41)
Albumin: 1.9 g/dL — ABNORMAL LOW (ref 3.5–5.0)
Alkaline Phosphatase: 164 U/L — ABNORMAL HIGH (ref 38–126)
Anion gap: 10 (ref 5–15)
BUN: <5 mg/dL — ABNORMAL LOW (ref 6–20)
CO2: 19 mmol/L — ABNORMAL LOW (ref 22–32)
Calcium: 8.8 mg/dL — ABNORMAL LOW (ref 8.9–10.3)
Chloride: 110 mmol/L (ref 98–111)
Creatinine, Ser: 1 mg/dL (ref 0.61–1.24)
GFR calc Af Amer: >60 mL/min (ref >60)
GFR calc non Af Amer: >60 mL/min (ref >60)
Glucose, Bld: 111 mg/dL — ABNORMAL HIGH (ref 70–99)
Potassium: 3.8 mmol/L (ref 3.5–5.1)
Sodium: 139 mmol/L (ref 135–145)
Total Bilirubin: 3.3 mg/dL — ABNORMAL HIGH (ref 0.3–1.2)
Total Protein: 7.8 g/dL (ref 6.5–8.1)

## 2019-07-15 LAB — BRAIN NATRIURETIC PEPTIDE: B Natriuretic Peptide: 53.1 pg/mL (ref 0.0–100.0)

## 2019-07-15 LAB — URINALYSIS, ROUTINE W REFLEX MICROSCOPIC
Glucose, UA: NEGATIVE mg/dL
Hgb urine dipstick: NEGATIVE
Ketones, ur: NEGATIVE mg/dL
Leukocytes,Ua: NEGATIVE
Nitrite: NEGATIVE
Protein, ur: NEGATIVE mg/dL
Specific Gravity, Urine: 1.019 (ref 1.005–1.030)
pH: 6 (ref 5.0–8.0)

## 2019-07-15 LAB — CBC
HCT: 22.8 % — ABNORMAL LOW (ref 39.0–52.0)
Hemoglobin: 7.8 g/dL — ABNORMAL LOW (ref 13.0–17.0)
MCH: 32.5 pg (ref 26.0–34.0)
MCHC: 34.2 g/dL (ref 30.0–36.0)
MCV: 95 fL (ref 80.0–100.0)
Platelets: 209 10*3/uL (ref 150–400)
RBC: 2.4 MIL/uL — ABNORMAL LOW (ref 4.22–5.81)
RDW: 22.1 % — ABNORMAL HIGH (ref 11.5–15.5)
WBC: 9.2 10*3/uL (ref 4.0–10.5)
nRBC: 0 % (ref 0.0–0.2)

## 2019-07-15 LAB — ETHANOL: Alcohol, Ethyl (B): 18 mg/dL — ABNORMAL HIGH (ref <10)

## 2019-07-15 LAB — PROTEIN, PLEURAL OR PERITONEAL FLUID: Total protein, fluid: <3 g/dL

## 2019-07-15 LAB — IRON AND TIBC
Iron: 133 ug/dL (ref 45–182)
Saturation Ratios: 91 % — ABNORMAL HIGH (ref 17.9–39.5)
TIBC: 146 ug/dL — ABNORMAL LOW (ref 250–450)
UIBC: 13 ug/dL

## 2019-07-15 LAB — MAGNESIUM: Magnesium: 1.6 mg/dL — ABNORMAL LOW (ref 1.7–2.4)

## 2019-07-15 LAB — ALBUMIN, PLEURAL OR PERITONEAL FLUID: Albumin, Fluid: <1 g/dL

## 2019-07-15 LAB — GLUCOSE, PLEURAL OR PERITONEAL FLUID: Glucose, Fluid: 116 mg/dL

## 2019-07-15 LAB — GRAM STAIN

## 2019-07-15 LAB — HEPATITIS B SURFACE ANTIGEN: Hepatitis B Surface Ag: NONREACTIVE

## 2019-07-15 LAB — LACTIC ACID, PLASMA
Lactic Acid, Venous: 2.9 mmol/L (ref 0.5–1.9)
Lactic Acid, Venous: 2 mmol/L (ref 0.5–1.9)

## 2019-07-15 LAB — HEPATITIS C ANTIBODY: HCV Ab: REACTIVE — AB

## 2019-07-15 LAB — FERRITIN: Ferritin: 733 ng/mL — ABNORMAL HIGH (ref 24–336)

## 2019-07-15 LAB — HIV ANTIBODY (ROUTINE TESTING W REFLEX): HIV Screen 4th Generation wRfx: NONREACTIVE

## 2019-07-15 LAB — LACTATE DEHYDROGENASE, PLEURAL OR PERITONEAL FLUID: LD, Fluid: 31 U/L — ABNORMAL HIGH (ref 3–23)

## 2019-07-15 LAB — LIPASE, BLOOD: Lipase: 32 U/L (ref 11–51)

## 2019-07-15 LAB — PHOSPHORUS: Phosphorus: 3.6 mg/dL (ref 2.5–4.6)

## 2019-07-15 MED ORDER — LORAZEPAM 2 MG/ML IJ SOLN: 0.0000 mg | Freq: Two times a day (BID) | INTRAMUSCULAR | Status: DC

## 2019-07-15 MED ORDER — MAGNESIUM SULFATE 2 GM/50ML IV SOLN
2.0000 g | Freq: Once | INTRAVENOUS | Status: AC
  Administered 2019-07-15: 2 g via INTRAVENOUS
  Filled 2019-07-15: qty 50

## 2019-07-15 MED ORDER — ENOXAPARIN SODIUM 40 MG/0.4ML ~~LOC~~ SOLN
40.0000 mg | SUBCUTANEOUS | Status: DC
  Administered 2019-07-15 – 2019-07-16 (×2): 40 mg via SUBCUTANEOUS
  Filled 2019-07-15 (×2): qty 0.4

## 2019-07-15 MED ORDER — FENTANYL CITRATE (PF) 100 MCG/2ML IJ SOLN
25.0000 ug | Freq: Once | INTRAMUSCULAR | Status: AC
  Administered 2019-07-15: 25 ug via INTRAVENOUS
  Filled 2019-07-15: qty 2

## 2019-07-15 MED ORDER — SODIUM CHLORIDE 0.9 % IV BOLUS
1000.0000 mL | Freq: Once | INTRAVENOUS | Status: AC
  Administered 2019-07-15: 1000 mL via INTRAVENOUS

## 2019-07-15 MED ORDER — ACETAMINOPHEN 500 MG PO TABS: 500.0000 mg | ORAL_TABLET | Freq: Four times a day (QID) | ORAL | Status: DC | PRN

## 2019-07-15 MED ORDER — LORAZEPAM 2 MG/ML IJ SOLN
0.0000 mg | Freq: Four times a day (QID) | INTRAMUSCULAR | Status: AC
  Administered 2019-07-15 – 2019-07-16 (×2): 1 mg via INTRAVENOUS
  Filled 2019-07-15 (×2): qty 1

## 2019-07-15 MED ORDER — FOLIC ACID 1 MG PO TABS
1.0000 mg | ORAL_TABLET | Freq: Every day | ORAL | Status: DC
  Administered 2019-07-15 – 2019-07-17 (×3): 1 mg via ORAL
  Filled 2019-07-15 (×3): qty 1

## 2019-07-15 MED ORDER — ADULT MULTIVITAMIN W/MINERALS CH
1.0000 | ORAL_TABLET | Freq: Every day | ORAL | Status: DC
  Administered 2019-07-15 – 2019-07-17 (×3): 1 via ORAL
  Filled 2019-07-15 (×3): qty 1

## 2019-07-15 MED ORDER — HYDROMORPHONE HCL 1 MG/ML IJ SOLN
0.5000 mg | Freq: Once | INTRAMUSCULAR | Status: AC
  Administered 2019-07-15: 0.5 mg via INTRAVENOUS
  Filled 2019-07-15: qty 1

## 2019-07-15 MED ORDER — THIAMINE HCL 100 MG/ML IJ SOLN
100.0000 mg | Freq: Every day | INTRAMUSCULAR | Status: DC
  Filled 2019-07-15: qty 2

## 2019-07-15 MED ORDER — LORAZEPAM 1 MG PO TABS
0.0000 mg | ORAL_TABLET | Freq: Four times a day (QID) | ORAL | Status: AC
  Administered 2019-07-15: 2 mg via ORAL
  Administered 2019-07-17: 02:00:00 1 mg via ORAL
  Filled 2019-07-15: qty 1
  Filled 2019-07-15 (×2): qty 2

## 2019-07-15 MED ORDER — NICOTINE 14 MG/24HR TD PT24
14.0000 mg | MEDICATED_PATCH | Freq: Every day | TRANSDERMAL | Status: DC
  Administered 2019-07-15 – 2019-07-17 (×3): 14 mg via TRANSDERMAL
  Filled 2019-07-15 (×3): qty 1

## 2019-07-15 MED ORDER — PANTOPRAZOLE SODIUM 40 MG PO TBEC
40.0000 mg | DELAYED_RELEASE_TABLET | Freq: Every day | ORAL | Status: DC
  Administered 2019-07-15 – 2019-07-17 (×3): 40 mg via ORAL
  Filled 2019-07-15 (×3): qty 1

## 2019-07-15 MED ORDER — VITAMIN B-1 100 MG PO TABS
100.0000 mg | ORAL_TABLET | Freq: Every day | ORAL | Status: DC
  Administered 2019-07-15 – 2019-07-17 (×3): 100 mg via ORAL
  Filled 2019-07-15 (×3): qty 1

## 2019-07-15 MED ORDER — SPIRONOLACTONE 100 MG PO TABS
100.0000 mg | ORAL_TABLET | Freq: Once | ORAL | Status: AC
  Administered 2019-07-15: 100 mg via ORAL
  Filled 2019-07-15: qty 1
  Filled 2019-07-15: qty 4

## 2019-07-15 MED ORDER — OXYCODONE HCL 5 MG PO TABS
5.0000 mg | ORAL_TABLET | ORAL | Status: DC | PRN
  Administered 2019-07-15 – 2019-07-17 (×6): 5 mg via ORAL
  Filled 2019-07-15 (×7): qty 1

## 2019-07-15 MED ORDER — LORAZEPAM 2 MG/ML IJ SOLN: 1.0000 mg | INTRAMUSCULAR | Status: DC | PRN

## 2019-07-15 MED ORDER — LORAZEPAM 1 MG PO TABS: 0.0000 mg | ORAL_TABLET | Freq: Two times a day (BID) | ORAL | Status: DC

## 2019-07-15 MED ORDER — LORAZEPAM 1 MG PO TABS
1.0000 mg | ORAL_TABLET | ORAL | Status: DC | PRN
  Administered 2019-07-15: 1 mg via ORAL
  Administered 2019-07-17: 2 mg via ORAL
  Filled 2019-07-15: qty 2

## 2019-07-15 NOTE — ED Notes (Signed)
Dinner Tray Ordered @ 1803.  

## 2019-07-15 NOTE — ED Triage Notes (Signed)
Pt BIB [facility] for abdominal pain, shortness of breath and decreased po intake x3 weeks. Pt also reporting vomiting daily. Pt is alert and oriented x4 at present time. Abdominal appears distended upon ED arrival.   EMS reports initial heart rate of 150, given 410mL of NSS and heart rate came down to 120. Pt also reports his PCP discontinued his home bp meds about amonth ago.   EMS Vitals  150/98  120 bpm RR 18 SpO2 99% on room air

## 2019-07-15 NOTE — H&P (Addendum)
[facility] Admission History and Physical Service Pager: [phone]  Patient name: Calvin [name] Medical record number: [id number] Date of birth: [birth year] Age: 58 y.o. Gender: male  Primary Care Provider: Javier Docker, MD Consultants: none Code Status: Full [place] Persons: [phone]  Chief Complaint: "Stomach pain"  Assessment and Plan: ALTA SHOBER is a 58 y.o. male presenting with abdominal pain and distention. PMH is significant for alcohol abuse, hyperlipidemia, hypertension, history of TIA.  Ascites likely secondary to decompensated cirrhosis Patient presenting with new onset ascites and likely decompensated cirrhosis with significant history of alcohol abuse.  Etiology for ascites includes portal vein hypertension from cirrhosis, malignancy, and low levels of albumin leading to extravascular fluid accumulation. Diagnostic paracentesis was performed with approximately 50 cc of fluid removed which should provide diagnostic information to determine potential cause of patient's ascites.  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  Initial labs showed lactate level of 2.9, albumin reduced at 1.9, elevated alk phos of 164, elevated AST at 107 and ALT in normal range at 25.  The 2:1 ratio of AST to ALT is suggestive of alcohol abuse, which coincides with the patient's history of drinking approximately 3 pints of wine per day.  Patient also had a BNP that was within normal limits.  Abdominal CT also supports this with "probable hepatic steatosis or other diffuse hepatocellular disease such as cirrhosis".  Chest x-ray showed only mild left basilar subsegmental atelectasis. -Admit to med telemetry, attending Dr. McDiarmid -Morning CMP and CBC -Cytology, SAGG ratio, cultures from diagnostic  paracentesis -Cirrhosis work-up as below -trend lactate level -Start spironolactone 100 mg daily.  Increase by 100 mg every 7 days if response is insufficient. -Monitor potassium  Decompensated cirrhosis  Likely related to patient's excessive alcohol consumption.  Other etiologies for cirrhosis can include viral such as hepatitis C, medication induced, NASH.  Patient is on medications do not currently include those commonly associated with liver damage. -Can consider viral panel -GI consult as above  -Check INR and PT -Monitor phosphorus level  Alcohol withdrawal Patient states he drinks 3 pints of wine per day typically.  Last drink 10/22.  Patient has an alcohol level of 18 on admission.  He reports a history of 4 previous seizures (never formally assessed, seizures per patient report) but he does not take any antiepileptic medicaiton.  He denies ever having experienced withdrawal symptoms, ever having been admitted to the hospital for withdrawal. -Monitor CIWA's -Seizure precautions -Ativan as needed. -Thiamine 419 mg daily -Folic acid 1 mg daily  Anemia, normocytic Hemoglobin of 7.8 on this admission, seems patient baseline is around 12.8-13.7 in past. Current MCV of 95.  No report of hematochezia or obvious blood loss during admission. -Check iron and TIBC -Check ferritin -Follow-up FOBT  Hypertension Blood pressure on admission 120s-140s/90s-110.  Patient does not appear to have home medication for hypertension -Consider adding hypertensive medication if blood pressures do not improve.  Marijuana use Smokes about 2 times per week -Recommend cessation  Nicotine use disorder Smokes about 1/2 pack per day at present -Provide nicotine patch per patient at his request  Chronic pain Home medications include percocet 10-325 every 4 hours as needed.  PDMP shows that he regularly fills his prescriptions monthly. -Oxycodone 62m every 4 hours as needed  Constipation: Patient  states last normal bowel movement  was "maybe 2-3 weeks ago". States he has only had a small one or two since then. - Miralax daily   FEN/GI: salt restriction diet Prophylaxis: Lovenox  Disposition: Pending medical work-up, admit to med telemetry  History of Present Illness:  Calvin [name] is a 58 y.o. male presenting with stomach pain for the past 2 to [redacted] weeks along with some decreased appetite.  Patient states that he has not been able to keep much food down as he seems to vomit 2 or 3 times per day and has done so over the past 2 3 weeks.  He states the vomit is nonbloody but sometimes does have a green tint to it which he thinks is related to his wine consumption.  The only thing that he states seems to make him feel better as wine.  He states he drinks approximately 3 pints of wine per day.  Patient states that his stomach pain occurs across the stomach not in any one location and it feels like "someone is pressing on it".  The pressure does not seem to come and go and is consistent throughout the day.  Patient states that due to the pressure in the size of his stomach it sometimes is difficult to breathe and that lately he has noticed he has become more short of breath such as when trying to put on his shoes.  He states that he does have some trouble breathing when lying down though this has occurred for some time now and has not new.  In the ED, CT abdomen showed moderate ascites.  Diagnostic paracentesis was performed with removal of 50 cc.  He was admitted to the family medicine teaching service for further work-up and treatment.  Review Of Systems: Per HPI with the following additions:   Review of Systems  Constitutional: Positive for malaise/fatigue. Negative for chills and fever.  HENT: Negative for congestion.   Eyes: Negative for blurred vision.  Respiratory: Positive for cough and shortness of breath.   Cardiovascular: Positive for chest pain.  Gastrointestinal: Positive for  abdominal pain, constipation (last BM 2-3 weeks ago), nausea and vomiting. Negative for diarrhea.  Genitourinary: Negative for dysuria and frequency.    Patient Active Problem List   Diagnosis Date Noted  . Decompensated hepatic cirrhosis ([place]) 07/15/2019  . TIA (transient ischemic attack) 06/23/2016  . Essential hypertension 06/23/2016  . Alcohol abuse 06/23/2016  . Chronic left hip pain 06/23/2016  . Hyponatremia 06/23/2016  . Mixed hyperlipidemia   . Blind right eye     Past Medical History: Past Medical History:  Diagnosis Date  . Chronic left hip pain   . Hypertension     Past Surgical History: Past Surgical History:  Procedure Laterality Date  . BACK SURGERY    . CERVICAL SPINE SURGERY    . JOINT REPLACEMENT      Social History: Social History   Tobacco Use  . Smoking status: Current Every Day Smoker  . Smokeless tobacco: Never Used  Substance Use Topics  . Alcohol use: Yes  . Drug use: No   Additional social history: None  Please also refer to relevant sections of EMR.  Family History: No family history on file.  Allergies and Medications: No Known Allergies No current facility-administered medications on file prior to encounter.    Current Outpatient Medications on File Prior to Encounter  Medication Sig Dispense Refill  . Ensure Plus (ENSURE PLUS) LIQD Take 237 mLs by mouth daily.    Marland Kitchen omeprazole (  PRILOSEC) 20 MG capsule Take 20 mg by mouth daily.    Marland Kitchen oxyCODONE-acetaminophen (PERCOCET) 10-325 MG tablet Take 1 tablet by mouth every 4 (four) hours as needed for pain.      Objective: BP 122/88   Pulse (!) 111   Temp 97.9 F (36.6 C) (Oral)   Resp (!) 33   Ht _0  (1.702 m)   Wt 77.1 kg   SpO2 99%   BMI 26.63 kg/m  Exam: General: Alert and oriented, no apparent distress  Eyes: Scleral icterus Cardiovascular: RRR with no murmurs noted Respiratory: CTA bilaterally  Gastrointestinal: Bowel sounds present.  Abdomen distended, abdominal pain  present particularly in central abdomen region.  No acute surgical findings.  Derm: No rashes noted Psych: Behavior and speech appropriate to situation  Labs and Imaging: CBC BMET  Recent Labs  Lab 07/15/19 1122  WBC 9.2  HGB 7.8*  HCT 22.8*  PLT 209   Recent Labs  Lab 07/15/19 1122  NA 139  K 3.8  CL 110  CO2 19*  BUN <5*  CREATININE 1.00  GLUCOSE 111*  CALCIUM 8.8*     Ct Abdomen Pelvis Wo Contrast  Result Date: 07/15/2019 CLINICAL DATA:  Small bowel obstruction. EXAM: CT ABDOMEN AND PELVIS WITHOUT CONTRAST TECHNIQUE: Multidetector CT imaging of the abdomen and pelvis was performed following the standard protocol without IV contrast. COMPARISON:  None. FINDINGS: Lower chest: No acute abnormality. Hepatobiliary: Large gallstone is noted. No biliary dilatation is noted. Hepatic low density is noted suggesting hepatic steatosis. Pancreas: Unremarkable. No pancreatic ductal dilatation or surrounding inflammatory changes. Spleen: Fluid is noted around the spleen.  Otherwise unremarkable. Adrenals/Urinary Tract: Adrenal glands appear normal. Small nonobstructive right renal calculus is noted. No hydronephrosis or renal obstruction is noted. Urinary bladder is unremarkable. Stomach/Bowel: Stomach is within normal limits. Appendix appears normal. No evidence of bowel wall thickening, distention, or inflammatory changes. Vascular/Lymphatic: Aortic atherosclerosis. No enlarged abdominal or pelvic lymph nodes. Reproductive: Prostate is unremarkable. Other: Mild to moderate ascites is noted in the around the liver, spleen and throughout the remaining abdomen and pelvis. Moderate size fluid-filled right inguinal hernia is noted. Small left inguinal hernia is noted. Musculoskeletal: Status post left hip arthroplasty. Bilateral L5 spondylolysis is noted. No acute abnormality is noted. IMPRESSION: Probable hepatic steatosis or other diffuse hepatocellular disease such as cirrhosis. Large solitary  gallstone is noted without inflammation or biliary dilatation. Mild to moderate ascites is noted. Small nonobstructive right renal calculus. No hydronephrosis or renal obstruction is noted. Bilateral inguinal hernias are noted, right greater than left. Aortic Atherosclerosis (ICD10-I70.0). Electronically Signed   By: Marijo Conception M.D.   On: 07/15/2019 12:03   Dg Chest Port 1 View  Result Date: 07/15/2019 CLINICAL DATA:  Shortness of breath. EXAM: PORTABLE CHEST 1 VIEW COMPARISON:  Feb 15, 2018. FINDINGS: The heart size and mediastinal contours are within normal limits. No pneumothorax or pleural effusion is noted. Right lung is clear. Minimal left basilar subsegmental atelectasis is noted. The visualized skeletal structures are unremarkable. IMPRESSION: Minimal left basilar subsegmental atelectasis. Electronically Signed   By: Marijo Conception M.D.   On: 07/15/2019 12:28     Lurline Del, DO 07/15/2019, 4:28 PM PGY-1, [facility] Intern pager: [phone], text pages welcome  FPTS Upper-Level Resident Addendum   I have independently interviewed and examined the patient. I have discussed the above with the original author and agree with their documentation. My edits for correction/addition/clarification are in blue. Please  see also any attending notes.    Matilde Haymaker MD PGY-2, [facility] 07/15/2019 8:43 PM  [facility] Service pager: [phone] (text pages welcome through [facility])

## 2019-07-15 NOTE — ED Provider Notes (Signed)
[facility] Emergency Department Provider Note MRN:  [id number]  Arrival date & time: 07/15/19     Chief Complaint   Abdominal Pain and Shortness of Breath   History of Present Illness   Calvin [name] is a 58 y.o. year-old male with a history of hypertension, alcohol use presenting to the ED with chief complaint of abdominal pain.  Patient has had poor appetite for 3 weeks.  For the past 2 weeks has noted some gradual distention of his abdomen.  Trouble having bowel movements for the past several days.  No issues with urinating.  No back pain, no neck pain.  Denies fever, no headache, no vision change, no chest pain.  Noting some shortness of breath over the past few days.  Drinks 3 pints of wine daily.  Review of Systems  A complete 10 system review of systems was obtained and all systems are negative except as noted in the HPI and PMH.   Patient's Health History    Past Medical History:  Diagnosis Date  . Chronic left hip pain   . Hypertension     Past Surgical History:  Procedure Laterality Date  . BACK SURGERY    . CERVICAL SPINE SURGERY    . JOINT REPLACEMENT      No family history on file.  Social History   Socioeconomic History  . Marital status: Married    Spouse name: Not on file  . Number of children: Not on file  . Years of education: Not on file  . Highest education level: Not on file  Occupational History  . Not on file  Social Needs  . Financial resource strain: Not on file  . Food insecurity    Worry: Not on file    Inability: Not on file  . Transportation needs    Medical: Not on file    Non-medical: Not on file  Tobacco Use  . Smoking status: Current Every Day Smoker  . Smokeless tobacco: Never Used  Substance and Sexual Activity  . Alcohol use: Yes  . Drug use: No  . Sexual activity: Not on file  Lifestyle  . Physical activity    Days per week: Not on file    Minutes per session: Not on file  . Stress: Not on  file  Relationships  . Social Herbalist on phone: Not on file    Gets together: Not on file    Attends religious service: Not on file    Active member of club or organization: Not on file    Attends meetings of clubs or organizations: Not on file    Relationship status: Not on file  . Intimate partner violence    Fear of current or ex partner: Not on file    Emotionally abused: Not on file    Physically abused: Not on file    Forced sexual activity: Not on file  Other Topics Concern  . Not on file  Social History Narrative  . Not on file     Physical Exam  Vital Signs and Nursing Notes reviewed Vitals:   07/15/19 1243 07/15/19 1245  BP: (!) 120/110 113/79  Pulse: (!) 120 (!) 118  Resp:  (!) 32  Temp:    SpO2:  100%    CONSTITUTIONAL: Well-appearing, NAD NEURO:  Alert and oriented x 3, no focal deficits EYES:  eyes equal and reactive ENT/NECK:  no LAD, no JVD CARDIO: Tachycardic rate, well-perfused, normal S1 and  S2 PULM:  CTAB no wheezing or rhonchi GI/GU:  normal bowel sounds, moderately distended, mildly tender diffusely MSK/SPINE:  No gross deformities, no edema SKIN:  no rash, atraumatic PSYCH:  Appropriate speech and behavior  Diagnostic and Interventional Summary    EKG Interpretation  Date/Time:    Ventricular Rate:    PR Interval:    QRS Duration:   QT Interval:    QTC Calculation:   R Axis:     Text Interpretation:        Labs Reviewed  CBC - Abnormal; Notable for the following components:      Result Value   RBC 2.40 (*)    Hemoglobin 7.8 (*)    HCT 22.8 (*)    RDW 22.1 (*)    All other components within normal limits  COMPREHENSIVE METABOLIC PANEL - Abnormal; Notable for the following components:   CO2 19 (*)    Glucose, Bld 111 (*)    BUN <5 (*)    Calcium 8.8 (*)    Albumin 1.9 (*)    AST 107 (*)    Alkaline Phosphatase 164 (*)    Total Bilirubin 3.3 (*)    All other components within normal limits  LACTIC ACID, PLASMA  - Abnormal; Notable for the following components:   Lactic Acid, Venous 2.9 (*)    All other components within normal limits  ETHANOL - Abnormal; Notable for the following components:   Alcohol, Ethyl (B) 18 (*)    All other components within normal limits  BODY FLUID CULTURE  SARS CORONAVIRUS 2 (TAT 6-24 HRS)  LIPASE, BLOOD  BRAIN NATRIURETIC PEPTIDE  URINALYSIS, ROUTINE W REFLEX MICROSCOPIC  LACTATE DEHYDROGENASE, PLEURAL OR PERITONEAL FLUID  GLUCOSE, PLEURAL OR PERITONEAL FLUID  PROTEIN, PLEURAL OR PERITONEAL FLUID  ALBUMIN, PLEURAL OR PERITONEAL FLUID  LACTIC ACID, PLASMA    DG Chest Port 1 View  Final Result    CT ABDOMEN PELVIS WO CONTRAST  Final Result      Medications  LORazepam (ATIVAN) injection 0-4 mg (1 mg Intravenous Given 07/15/19 1237)    Or  LORazepam (ATIVAN) tablet 0-4 mg ( Oral See Alternative 07/15/19 1237)  LORazepam (ATIVAN) injection 0-4 mg (has no administration in time range)    Or  LORazepam (ATIVAN) tablet 0-4 mg (has no administration in time range)  thiamine (VITAMIN B-1) tablet 100 mg (has no administration in time range)    Or  thiamine (B-1) injection 100 mg (has no administration in time range)  fentaNYL (SUBLIMAZE) injection 25 mcg (25 mcg Intravenous Given 07/15/19 1131)  sodium chloride 0.9 % bolus 1,000 mL (1,000 mLs Intravenous New Bag/Given 07/15/19 1236)  HYDROmorphone (DILAUDID) injection 0.5 mg (0.5 mg Intravenous Given 07/15/19 1237)     Ultrasound ED Abd  Date/Time: 07/15/2019 12:59 PM Performed by: Maudie Flakes, MD Authorized by: Maudie Flakes, MD   Procedure details:    Indications: abdominal pain     Assessment for:  Intra-abdominal fluid   Images: archived    Comments:     Moderate ascites .Paracentesis  Date/Time: 07/15/2019 1:00 PM Performed by: Maudie Flakes, MD Authorized by: Maudie Flakes, MD   Consent:    Consent obtained:  Verbal   Consent given by:  Patient   Risks discussed:  Bleeding,  bowel perforation, infection and pain Pre-procedure details:    Procedure purpose:  Diagnostic   Preparation: Patient was prepped and draped in usual sterile fashion   Anesthesia (see MAR for exact dosages):  Anesthesia method:  None Procedure details:    Needle gauge:  18   Ultrasound guidance: yes     Puncture site:  R lower quadrant   Fluid removed amount:  50cc   Fluid appearance:  Yellow   Dressing:  Adhesive bandage Post-procedure details:    Patient tolerance of procedure:  Tolerated well, no immediate complications   Critical Care  ED Course and Medical Decision Making  I have reviewed the triage vital signs and the nursing notes.  Pertinent labs & imaging results that were available during my care of the patient were reviewed by me and considered in my medical decision making (see below for details).  Concern for ascites related to decompensated cirrhosis in this 58 year old male with history of heavy drinking.  Also considering SBO, constipation, SBP.  Awaiting labs, CT imaging, will consider paracentesis.  CT without significant findings other than moderate ascites.  Patient continues to be tachycardic, has a elevated lactate, has some elevation to bilirubin and LFTs.  Suspect some component of alcohol withdrawal, also likely some decompensation related to cirrhosis.  Admitted to internal medicine service for further care.  Diagnostic paracentesis performed as described above, awaiting results.  Barth Kirks. [place]onia Small, [place] [email or web address]  Final Clinical Impressions(s) / ED Diagnoses     ICD-10-CM   1. Ascites due to alcoholic cirrhosis ([place])  K70.31   2. SOB (shortness of breath)  R06.02 DG Chest [facility] 1 View    DG Chest [place] 1 View  3. Alcohol withdrawal syndrome with complication [facility])  [id number]     ED Discharge Orders    None      Discharge Instructions Discussed with and Provided to Patient: Discharge  Instructions   None       Maudie Flakes, MD 07/15/19 1404

## 2019-07-15 NOTE — Progress Notes (Signed)
Family Medicine Teaching Service Daily Progress Note Intern Pager: [phone]  Patient name: Calvin [name] Medical record number: [id number] Date of birth: 12/29/1960 Age: 58 y.o. Gender: male  Primary Care Provider: Javier Docker, MD Consultants: GI Code Status: Full  Pt Overview and Major Events to Date:  10/23 admitted, diagnostic paracentesis  Assessment and Plan: Calvin [name] is a 58 y.o. male presenting with abdominal pain and distention. PMH is significant for alcohol abuse, hyperlipidemia, hypertension, history of TIA.  Ascites potentially secondary to decompensated cirrhosis but SAAG 0.7, INR 2.0, meld 19 Patient presenting with new onset ascites and likely decompensated cirrhosis with significant history of alcohol abuse.  Etiology for ascites includes portal vein hypertension from cirrhosis, malignancy, and low levels of albumin leading to extravascular fluid accumulation. Diagnostic paracentesis was performed with approximately 50 cc of fluid removed. 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  Initial labs showed lactate level of 2.9 (improved to 2), albumin reduced at 1.9, elevated alk phos of 164, elevated AST at 107 and ALT in normal range at 25.  The 2:1 ratio of AST to ALT is suggestive of alcohol abuse.   Abdominal CT also supports this with "probable hepatic steatosis or other diffuse hepatocellular disease such as cirrhosis".  Chest x-ray showed only mild left basilar subsegmental atelectasis. -daily CMP and CBC -f/u Cytology, cultures from diagnostic paracentesis -trend lactate level -Start spironolactone 100 mg daily.  Increase by 100 mg every 7 days if response is insufficient. -Added one-time 20 mg IV Lasix, will reevaluate for further doses -Monitor potassium (4.0 morning of 10/24) -GI consult for  determination of inpatient versus outpatient work-up and likely EGD/elastography [place] -f/u hepc quant (hepc antibody pos) -f/u hep b panel -vax for hep a =/- hepb given hepb panel results -Check INR and PT -Monitor mag/phosphorus level - f/u ammonia lab  Alcohol withdrawal Patient states he drinks 3 pints of wine per day typically.  Last drink 10/22.  Patient has an alcohol level of 18 on admission.  He reports a history of 4 previous seizures (never formally assessed, seizures per patient report) but he does not take any antiepileptic medicaiton.  He denies ever having experienced withdrawal symptoms, ever having been admitted to the hospital for withdrawal. -Monitor CIWA's -Seizure precautions -Ativan as needed. -Thiamine 846 mg daily -Folic acid 1 mg daily  -monitore mag/phos  Anemia, normocytic.  Decreasing slightly to 7.3 morning of 10/24 Hemoglobin of 7.8 on this admission, seems patient baseline is around 12.8-13.7 in past. Current MCV of 95.  No report of hematochezia or obvious blood loss during admission. Normal iron, Low TIBC,high sat, high ferritin -f/u smear -Follow-up FOBT -Continue to monitor  Hypertension Blood pressure on admission 120s-140s/90s-110.  Patient does not appear to have home medication for hypertension -can f/u outpatient for HTN  Marijuana use Smokes about 2 times per week -Recommend cessation  Nicotine use disorder Smokes about 1/2 pack per day at present -Provide nicotine patch per patient at his request  Chronic pain Home medications include percocet 10-325 every 4 hours as needed.  PDMP shows that he regularly fills his prescriptions monthly. -Oxycodone '5mg'$  every 4 hours as needed  Constipation: Patient states last normal bowel movement was "maybe 2-3 weeks ago". States he has only had a small one or two since then. - Miralax daily, can change to lactulose  if pending ammonia is high   FEN/GI: salt restriction diet Prophylaxis:  Lovenox  Disposition: Stable  Subjective:  Patient said his belly was tender but improved from admission.  He did say that he was starting to go into his withdrawals, he said that he knows he needs to quit drinking and will be following with his primary care physician on an outpatient basis for support.  We discussed  Objective: Temp:  [97.9 F (36.6 C)] 97.9 F (36.6 C) (10/23 1109) Pulse Rate:  [107-120] 107 (10/23 1615) Resp:  [18-33] 25 (10/23 2100) BP: (113-151)/(79-128) 126/100 (10/23 2100) SpO2:  [96 %-100 %] 96 % (10/23 1615) Weight:  [77.1 kg] 77.1 kg (10/23 1109) Physical Exam: General: uncomfortable but not toxic, does have mild tremors consistent with alcohol withdrawl Cardiovascular: regular rhythm, rate ~100 to my exam, no murmur noted Respiratory: CTAB, no IWB, no wheeze/coiugh Abdomen: distended and firm but no reboudn tenderness Psych: can appropriately discuss plan and medical history  Laboratory: Recent Labs  Lab 07/15/19 1122  WBC 9.2  HGB 7.8*  HCT 22.8*  PLT 209   Recent Labs  Lab 07/15/19 1122  NA 139  K 3.8  CL 110  CO2 19*  BUN <5*  CREATININE 1.00  CALCIUM 8.8*  PROT 7.8  BILITOT 3.3*  ALKPHOS 164*  ALT 25  AST 107*  GLUCOSE 111*    Imaging/Diagnostic Tests: Ct Abdomen Pelvis Wo Contrast  Result Date: 07/15/2019 CLINICAL DATA:  Small bowel obstruction. EXAM: CT ABDOMEN AND PELVIS WITHOUT CONTRAST TECHNIQUE: Multidetector CT imaging of the abdomen and pelvis was performed following the standard protocol without IV contrast. COMPARISON:  None. FINDINGS: Lower chest: No acute abnormality. Hepatobiliary: Large gallstone is noted. No biliary dilatation is noted. Hepatic low density is noted suggesting hepatic steatosis. Pancreas: Unremarkable. No pancreatic ductal dilatation or surrounding inflammatory changes. Spleen: Fluid is noted around the spleen.  Otherwise unremarkable. Adrenals/Urinary Tract: Adrenal glands appear normal. Small  nonobstructive right renal calculus is noted. No hydronephrosis or renal obstruction is noted. Urinary bladder is unremarkable. Stomach/Bowel: Stomach is within normal limits. Appendix appears normal. No evidence of bowel wall thickening, distention, or inflammatory changes. Vascular/Lymphatic: Aortic atherosclerosis. No enlarged abdominal or pelvic lymph nodes. Reproductive: Prostate is unremarkable. Other: Mild to moderate ascites is noted in the around the liver, spleen and throughout the remaining abdomen and pelvis. Moderate size fluid-filled right inguinal hernia is noted. Small left inguinal hernia is noted. Musculoskeletal: Status post left hip arthroplasty. Bilateral L5 spondylolysis is noted. No acute abnormality is noted. IMPRESSION: Probable hepatic steatosis or other diffuse hepatocellular disease such as cirrhosis. Large solitary gallstone is noted without inflammation or biliary dilatation. Mild to moderate ascites is noted. Small nonobstructive right renal calculus. No hydronephrosis or renal obstruction is noted. Bilateral inguinal hernias are noted, right greater than left. Aortic Atherosclerosis (ICD10-I70.0). Electronically Signed   By: Marijo Conception M.D.   On: 07/15/2019 12:03   Dg Chest Port 1 View  Result Date: 07/15/2019 CLINICAL DATA:  Shortness of breath. EXAM: PORTABLE CHEST 1 VIEW COMPARISON:  Feb 15, 2018. FINDINGS: The heart size and mediastinal contours are within normal limits. No pneumothorax or pleural effusion is noted. Right lung is clear. Minimal left basilar subsegmental atelectasis is noted. The visualized skeletal structures are unremarkable. IMPRESSION: Minimal left basilar subsegmental atelectasis. Electronically Signed   By: Marijo Conception M.D.   On: 07/15/2019 12:28     Sherene Sires, DO 07/15/2019, 10:31 PM PGY-3, [place]  [place] Intern pager: [phone], text pages welcome

## 2019-07-15 NOTE — ED Notes (Signed)
Patient wants to put his cousin down as a contact. Calvin [name] [phone]

## 2019-07-16 ENCOUNTER — Inpatient Hospital Stay (HOSPITAL_COMMUNITY): Payer: Medicare Other

## 2019-07-16 DIAGNOSIS — K7031 Alcoholic cirrhosis of liver with ascites: Secondary | ICD-10-CM | POA: Diagnosis not present

## 2019-07-16 DIAGNOSIS — F10939 Alcohol use, unspecified with withdrawal, unspecified: Secondary | ICD-10-CM

## 2019-07-16 DIAGNOSIS — F10239 Alcohol dependence with withdrawal, unspecified: Secondary | ICD-10-CM

## 2019-07-16 DIAGNOSIS — R0602 Shortness of breath: Secondary | ICD-10-CM

## 2019-07-16 LAB — CBC
HCT: 20.5 % — ABNORMAL LOW (ref 39.0–52.0)
Hemoglobin: 7.3 g/dL — ABNORMAL LOW (ref 13.0–17.0)
MCH: 33.5 pg (ref 26.0–34.0)
MCHC: 35.6 g/dL (ref 30.0–36.0)
MCV: 94 fL (ref 80.0–100.0)
Platelets: 164 10*3/uL (ref 150–400)
RBC: 2.18 MIL/uL — ABNORMAL LOW (ref 4.22–5.81)
RDW: 21.7 % — ABNORMAL HIGH (ref 11.5–15.5)
WBC: 9 10*3/uL (ref 4.0–10.5)
nRBC: 0.2 % (ref 0.0–0.2)

## 2019-07-16 LAB — MAGNESIUM: Magnesium: 1.6 mg/dL — ABNORMAL LOW (ref 1.7–2.4)

## 2019-07-16 LAB — COMPREHENSIVE METABOLIC PANEL
ALT: 22 U/L (ref 0–44)
AST: 87 U/L — ABNORMAL HIGH (ref 15–41)
Albumin: 1.7 g/dL — ABNORMAL LOW (ref 3.5–5.0)
Alkaline Phosphatase: 152 U/L — ABNORMAL HIGH (ref 38–126)
Anion gap: 7 (ref 5–15)
BUN: 5 mg/dL — ABNORMAL LOW (ref 6–20)
CO2: 20 mmol/L — ABNORMAL LOW (ref 22–32)
Calcium: 8.7 mg/dL — ABNORMAL LOW (ref 8.9–10.3)
Chloride: 111 mmol/L (ref 98–111)
Creatinine, Ser: 1.11 mg/dL (ref 0.61–1.24)
GFR calc Af Amer: >60 mL/min (ref >60)
GFR calc non Af Amer: >60 mL/min (ref >60)
Glucose, Bld: 97 mg/dL (ref 70–99)
Potassium: 4 mmol/L (ref 3.5–5.1)
Sodium: 138 mmol/L (ref 135–145)
Total Bilirubin: 3.1 mg/dL — ABNORMAL HIGH (ref 0.3–1.2)
Total Protein: 7 g/dL (ref 6.5–8.1)

## 2019-07-16 LAB — PROTIME-INR
INR: 2 — ABNORMAL HIGH (ref 0.8–1.2)
Prothrombin Time: 22.6 seconds — ABNORMAL HIGH (ref 11.4–15.2)

## 2019-07-16 LAB — ALBUMIN, PLEURAL OR PERITONEAL FLUID: Albumin, Fluid: <1 g/dL

## 2019-07-16 LAB — SARS CORONAVIRUS 2 (TAT 6-24 HRS): SARS Coronavirus 2: NEGATIVE

## 2019-07-16 LAB — BODY FLUID CELL COUNT WITH DIFFERENTIAL
Lymphs, Fluid: 31 %
Monocyte-Macrophage-Serous Fluid: 67 % (ref 50–90)
Neutrophil Count, Fluid: 2 % (ref 0–25)
Total Nucleated Cell Count, Fluid: 34 cu mm (ref 0–1000)

## 2019-07-16 LAB — AMMONIA: Ammonia: 70 umol/L — ABNORMAL HIGH (ref 9–35)

## 2019-07-16 LAB — MRSA PCR SCREENING: MRSA by PCR: NEGATIVE

## 2019-07-16 LAB — SAVE SMEAR(SSMR), FOR PROVIDER SLIDE REVIEW

## 2019-07-16 MED ORDER — MAGNESIUM SULFATE 2 GM/50ML IV SOLN
2.0000 g | Freq: Once | INTRAVENOUS | Status: AC
  Administered 2019-07-16: 2 g via INTRAVENOUS
  Filled 2019-07-16: qty 50

## 2019-07-16 MED ORDER — SPIRONOLACTONE 25 MG PO TABS
100.0000 mg | ORAL_TABLET | Freq: Every day | ORAL | Status: DC
  Administered 2019-07-16 – 2019-07-17 (×2): 100 mg via ORAL
  Filled 2019-07-16 (×2): qty 4

## 2019-07-16 MED ORDER — LACTULOSE 10 GM/15ML PO SOLN
10.0000 g | Freq: Two times a day (BID) | ORAL | Status: DC
  Administered 2019-07-16 – 2019-07-17 (×3): 10 g via ORAL
  Filled 2019-07-16 (×3): qty 15

## 2019-07-16 MED ORDER — FUROSEMIDE 40 MG PO TABS
40.0000 mg | ORAL_TABLET | Freq: Every day | ORAL | Status: DC
  Administered 2019-07-16 – 2019-07-17 (×2): 40 mg via ORAL
  Filled 2019-07-16 (×2): qty 1

## 2019-07-16 MED ORDER — FUROSEMIDE 10 MG/ML IJ SOLN
20.0000 mg | Freq: Once | INTRAMUSCULAR | Status: AC
  Administered 2019-07-16: 20 mg via INTRAVENOUS
  Filled 2019-07-16: qty 2

## 2019-07-16 MED ORDER — LIDOCAINE HCL (PF) 1 % IJ SOLN
INTRAMUSCULAR | Status: AC
  Filled 2019-07-16: qty 30

## 2019-07-16 MED ORDER — ENSURE ENLIVE PO LIQD
237.0000 mL | Freq: Two times a day (BID) | ORAL | Status: DC
  Administered 2019-07-16 – 2019-07-17 (×4): 237 mL via ORAL

## 2019-07-16 MED ORDER — ORAL CARE MOUTH RINSE
15.0000 mL | Freq: Two times a day (BID) | OROMUCOSAL | Status: DC
  Administered 2019-07-16 – 2019-07-17 (×3): 15 mL via OROMUCOSAL

## 2019-07-16 NOTE — Consult Note (Addendum)
Consultation  Referring Provider: Dr. McDiarmid    Primary Care Physician:  Javier Docker, MD Primary Gastroenterologist: Althia Forts        Reason for Consultation: Decompensated cirrhosis with ascites            HPI:   Calvin [name] is a 58 y.o. AA male with a past medical history as listed below, who presented to the ER on 07/15/2019 with a complaint of "stomach plain".  At time of admission he had abdominal distention and a past medical history significant for alcohol abuse, we are being consulted due to ascites likely secondary to decompensated cirrhosis.    Today, it should be noted that the patient is a very poor historian, the patient explains that he started with abdominal pain over the past 2 to 3 weeks with some decrease in appetite.  He tells me that this is because "my daughter got her leg shot off" and he just does not have much of an appetite.  He then started vomiting 2 or 3 times a day over the past 2 to 3 weeks and had not been able to keep much down.  When asking and details about this the patient does not feel as though food even makes it to his stomach, rather it gets stuck and then comes back up.  This also happens with his multivitamins.  Describes that generalized abdominal pain feels like someone is "pressing down all over it", apparently this is been there for over a month and a half.  Associated symptoms include a feeling of shortness of breath.     Tells me he has not had a bowel movement for the past week, he is passing gas though.    Social history positive for drinking approximately 3 "small bottles" of wine per day for the past 50 years.    Denies fever, chills, blood in the stool or symptoms that awaken him from sleep.  ER course: CT abdomen without contrast showed probable hepatic steatosis or other diffuse hepatocellular disease such as cirrhosis, large solitary gallstone noted without inflammation or biliary dilatation, mild to moderate ascites, small  nonobstructive right renal calculus, bilateral inguinal hernias, aortic atherosclerosis, diagnostic paracentesis was performed with removal of 50 cc; labs show a lactate level of 2.9, albumin decreased at 1.9, alk phos elevated at 164, AST elevated at 107 and ALT normal at 25, hemoglobin 7.8 (12.8 10 months ago), iron studies with a ferritin elevated at 733, hepatitis C antibody reactive, HCV RNA quantitative pending, INR elevated at 2  No GI history  Past Medical History:  Diagnosis Date  . Chronic left hip pain   . Hypertension     Past Surgical History:  Procedure Laterality Date  . BACK SURGERY    . CERVICAL SPINE SURGERY    . JOINT REPLACEMENT     Family history: No liver disease  Social History   Tobacco Use  . Smoking status: Current Every Day Smoker  . Smokeless tobacco: Never Used  Substance Use Topics  . Alcohol use: Yes  . Drug use: No    Prior to Admission medications   Medication Sig Start Date End Date Taking? Authorizing Provider  Ensure Plus (ENSURE PLUS) LIQD Take 237 mLs by mouth daily.   Yes [provider]  omeprazole (PRILOSEC) 20 MG capsule Take 20 mg by mouth daily.   Yes [provider]  oxyCODONE-acetaminophen (PERCOCET) 10-325 MG tablet Take 1 tablet by mouth every 4 (four) hours as  needed for pain.   Yes [provider]    Current Facility-Administered Medications  Medication Dose Route Frequency Provider Last Rate Last Dose  . acetaminophen (TYLENOL) tablet 500 mg  500 mg Oral Q6H PRN Matilde Haymaker, MD      . enoxaparin (LOVENOX) injection 40 mg  40 mg Subcutaneous Q24H Matilde Haymaker, MD   40 mg at 07/15/19 2301  . feeding supplement (ENSURE ENLIVE) (ENSURE ENLIVE) liquid 237 mL  237 mL Oral BID BM McDiarmid, Blane Ohara, MD      . folic acid (FOLVITE) tablet 1 mg  1 mg Oral Daily Matilde Haymaker, MD   1 mg at 07/15/19 2059  . furosemide (LASIX) injection 20 mg  20 mg Intravenous Once Bland, Scott, DO      . lactulose (CHRONULAC)  10 GM/15ML solution 10 g  10 g Oral BID Sherene Sires, DO      . LORazepam (ATIVAN) injection 0-4 mg  0-4 mg Intravenous Q6H Matilde Haymaker, MD   1 mg at 07/15/19 1237   Or  . LORazepam (ATIVAN) tablet 0-4 mg  0-4 mg Oral Q6H Matilde Haymaker, MD   2 mg at 07/15/19 2059  . LORazepam (ATIVAN) tablet 1-4 mg  1-4 mg Oral Q1H PRN Matilde Haymaker, MD   1 mg at 07/15/19 1628   Or  . LORazepam (ATIVAN) injection 1-4 mg  1-4 mg Intravenous Q1H PRN Matilde Haymaker, MD      . MEDLINE mouth rinse  15 mL Mouth Rinse BID McDiarmid, Blane Ohara, MD      . multivitamin with minerals tablet 1 tablet  1 tablet Oral Daily Matilde Haymaker, MD   1 tablet at 07/15/19 2059  . nicotine (NICODERM CQ - dosed in mg/24 hours) patch 14 mg  14 mg Transdermal Daily Matilde Haymaker, MD   14 mg at 07/15/19 2101  . oxyCODONE (Oxy IR/ROXICODONE) immediate release tablet 5 mg  5 mg Oral Q4H PRN Matilde Haymaker, MD   5 mg at 07/15/19 2101  . pantoprazole (PROTONIX) EC tablet 40 mg  40 mg Oral Daily Matilde Haymaker, MD   40 mg at 07/15/19 2059  . spironolactone (ALDACTONE) tablet 100 mg  100 mg Oral Daily Bland, Scott, DO      . thiamine (VITAMIN B-1) tablet 100 mg  100 mg Oral Daily Matilde Haymaker, MD   100 mg at 07/15/19 2100   Or  . thiamine (B-1) injection 100 mg  100 mg Intravenous Daily Matilde Haymaker, MD        Allergies as of 07/15/2019  . (No Known Allergies)     Review of Systems:    Constitutional: +fatigue HEENT: Eyes: No change in vision               Ears, Nose, Throat:  No change in hearing or congestion Skin: No rash  Cardiovascular:+chest pain Respiratory: +cough and SOB Gastrointestinal: See HPI and otherwise negative Genitourinary: No dysuria  Neurological: No headache Musculoskeletal: No new muscle or joint pain Hematologic: No bleeding  Psychiatric: No history of depression or anxiety (? Depression given daughter's recent gunshot wound)   Physical Exam:  Vital signs in last 24 hours: Temp:  [97.9 F (36.6 C)-98.8 F (37.1  C)] 98.8 F (37.1 C) (10/24 0434) Pulse Rate:  [102-120] 108 (10/24 0736) Resp:  [18-43] 22 (10/24 0736) BP: (102-151)/(79-128) 115/84 (10/24 0736) SpO2:  [95 %-100 %] 98 % (10/24 0736) Weight:  [77.1 kg] 77.1 kg (10/23 1109) Last BM Date: 06/24/19(Per  patient, poor intake.) General:   Pleasant AA male appears to be in NAD, Well developed, Well nourished, alert and cooperative Head:  Normocephalic and atraumatic. Eyes:   PEERL, EOMI.+icterus Conjunctiva pink. Ears:  Normal auditory acuity. Neck:  Supple Throat: Oral cavity and pharynx without inflammation, swelling or lesion.  Lungs: Respirations even and unlabored. Lungs clear to auscultation bilaterally.   No wheezes, crackles, or rhonchi.  Heart: Normal S1, S2. No MRG. Regular rate and rhythm. No peripheral edema, cyanosis or pallor.  Abdomen:  Soft, Moderate distension, Mild generalized ttp, No rebound or guarding. Normal bowel sounds. No appreciable masses or hepatomegaly. Rectal:  Not performed.  Msk:  Symmetrical without gross deformities. Peripheral pulses intact.  Extremities:  Without edema, no deformity or joint abnormality.  Neurologic:  Alert and  oriented x4;  grossly normal neurologically.  Skin:   Dry and intact without significant lesions or rashes. Psychiatric: Demonstrates good judgement and reason without abnormal affect or behaviors.   LAB RESULTS: Recent Labs    07/15/19 1122 07/16/19 0242  WBC 9.2 9.0  HGB 7.8* 7.3*  HCT 22.8* 20.5*  PLT 209 164   BMET Recent Labs    07/15/19 1122 07/16/19 0242  NA 139 138  K 3.8 4.0  CL 110 111  CO2 19* 20*  GLUCOSE 111* 97  BUN <5* 5*  CREATININE 1.00 1.11  CALCIUM 8.8* 8.7*   LFT Recent Labs    07/16/19 0242  PROT 7.0  ALBUMIN 1.7*  AST 87*  ALT 22  ALKPHOS 152*  BILITOT 3.1*   PT/INR Recent Labs    07/16/19 0242  LABPROT 22.6*  INR 2.0*    STUDIES: Ct Abdomen Pelvis Wo Contrast  Result Date: 07/15/2019 CLINICAL DATA:  Small bowel  obstruction. EXAM: CT ABDOMEN AND PELVIS WITHOUT CONTRAST TECHNIQUE: Multidetector CT imaging of the abdomen and pelvis was performed following the standard protocol without IV contrast. COMPARISON:  None. FINDINGS: Lower chest: No acute abnormality. Hepatobiliary: Large gallstone is noted. No biliary dilatation is noted. Hepatic low density is noted suggesting hepatic steatosis. Pancreas: Unremarkable. No pancreatic ductal dilatation or surrounding inflammatory changes. Spleen: Fluid is noted around the spleen.  Otherwise unremarkable. Adrenals/Urinary Tract: Adrenal glands appear normal. Small nonobstructive right renal calculus is noted. No hydronephrosis or renal obstruction is noted. Urinary bladder is unremarkable. Stomach/Bowel: Stomach is within normal limits. Appendix appears normal. No evidence of bowel wall thickening, distention, or inflammatory changes. Vascular/Lymphatic: Aortic atherosclerosis. No enlarged abdominal or pelvic lymph nodes. Reproductive: Prostate is unremarkable. Other: Mild to moderate ascites is noted in the around the liver, spleen and throughout the remaining abdomen and pelvis. Moderate size fluid-filled right inguinal hernia is noted. Small left inguinal hernia is noted. Musculoskeletal: Status post left hip arthroplasty. Bilateral L5 spondylolysis is noted. No acute abnormality is noted. IMPRESSION: Probable hepatic steatosis or other diffuse hepatocellular disease such as cirrhosis. Large solitary gallstone is noted without inflammation or biliary dilatation. Mild to moderate ascites is noted. Small nonobstructive right renal calculus. No hydronephrosis or renal obstruction is noted. Bilateral inguinal hernias are noted, right greater than left. Aortic Atherosclerosis (ICD10-I70.0). Electronically Signed   By: Marijo Conception M.D.   On: 07/15/2019 12:03   Dg Chest Port 1 View  Result Date: 07/15/2019 CLINICAL DATA:  Shortness of breath. EXAM: PORTABLE CHEST 1 VIEW  COMPARISON:  Feb 15, 2018. FINDINGS: The heart size and mediastinal contours are within normal limits. No pneumothorax or pleural effusion is noted. Right lung is clear. Minimal  left basilar subsegmental atelectasis is noted. The visualized skeletal structures are unremarkable. IMPRESSION: Minimal left basilar subsegmental atelectasis. Electronically Signed   By: Marijo Conception M.D.   On: 07/15/2019 12:28    Impression / Plan:   Impression: 1.  Acute Hepatitis with ascites: ?cirrhosis? Likely due to alcohol, MELD +Na= 19, SAAG unable to be calculate given indeterminate albumin fluid level <1; consider hepatic vs cardiac source  2.  Anemia: Hemoglobin 7.8 at admission (baseline around 12.8), no overt GI bleed-hemoccult pending per hospital team 3.  Constipation: Reports last bowel movement may be 1 week ago, started on Miralax qd by hospital team  Plan: 1.  Will add fluid cytology -repeat diagnostic paracenteses 2.  Continue Spironolactone 100 mg daily, will add Lasix 81m qd 3.  Agree with CIWA protocol 4.  Agree with MiraLAX qd 5.  Spoke with the lab, they are unable to get an exact value if the fluid albumin is less than 1 due to the machines parameters, reordered with repeat diagnostic paracentesis today. 6.  Please await further recommendations from Dr. JArdis Hughslater today  Thank you for your kind consultation, we will continue to follow.  JLavone NianLQuinlan Eye Surgery [facility] Pa 07/16/2019, 9:17 AM  ________________________________________________________________________  LVelora HecklerGI MD note:  I personally examined the patient, reviewed the data and agree with the assessment and plan described above.  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  Hep C Ab + in addition. His last drink was last night and he does tend to have tremors when he stops drinking.  He has normocytic  anemia, already started on folate, thiamine and MVI.  We asked for repeat paracentesis today to send cytology sample, will also try to get a more accurate fluid albumin level (<1 doesn't really help uKoreacalculate SAAG very well).  Will also get cell count and differential to check for SBP.  If no SBP they 30day course of steroids (ideally prednisolone 434monce daily) is indicated given the severity of his alcoholic hepatits.  Diuretics as above.  Obviously the most important thing he can do for his overall health going forward is to not resume drinking.  Will follow along.   DaOwens LofflerMD [facility] Pager [phone]

## 2019-07-16 NOTE — Procedures (Signed)
  PROCEDURE SUMMARY:  Successful US guided paracentesis from LLQ.  Yielded 1.5 L of clear yellow fluid.  No immediate complications.  Pt tolerated well.   Specimen was sent for labs.  EBL < 10mL  Ascencion Dike PA-C 07/16/2019 12:19 PM

## 2019-07-16 NOTE — Progress Notes (Signed)
LUI WAGEMAN [id number] Admission Data: 07/16/2019 3:38 AM Attending Provider: McDiarmid, Blane Ohara, MD  [id number], Ralene Bathe, MD Consults/ Treatment Team:   Calvin [name] is a 58 y.o. male patient admitted from ED awake, alert  & oriented  X 3,  Full Code, VS per flow sheet. O2   RA. Patient c/o SOB, but SpO2 99-100% on RA. No c/o chest pain, no distress noted. Progressive monitor placed and pt is currently running:sinus tachycardia   IV site WDL:  antecubital left, condition patent with a transparent dsg that's clean dry and intact.  Allergies:  No Known Allergies   Pt orientation to unit, room and routine. Information packet given to patient.  Admission INP armband ID verified with patient, and in place. SR up x 2, fall risk assessment complete with Patient verbalizing understanding of risks associated with falls. Pt verbalizes an understanding of how to use the call bell and to call for help before getting out of bed.  Skin, clean-dry- intact without evidence of bruising, or skin tears.   No evidence of skin break down noted on exam.  Patient not sure a person to designate for visitation. Patient states that he will let staff know at a later time.  Will continue to monitor and assist as needed.  Sylvie Farrier, RN 07/16/2019 3:38 AM

## 2019-07-16 NOTE — Evaluation (Signed)
Physical Therapy Evaluation Patient Details Name: Calvin [name] MRN: [id number] DOB: October 01, 1960 Today's Date: 07/16/2019   History of Present Illness  Pt admitted 07/15/2019 with paracentesis and anemia.  PMH is significant for alcohol abuse, hyperlipidemia, hypertension, possible seizures, chronic pain, history of TIA.  Clinical Impression  Pt admitted with above diagnosis.  Pt currently with functional limitations due to the deficits listed below (see PT Problem List). Pt will benefit from skilled PT to increase their independence and safety with mobility to allow discharge to his second story apartment.  Pt is likely near his baseline level of function and PT will foloow to be sure this is the case and to make sure he can get up the stairs to his apartment.       Follow Up Recommendations Home health PT;Supervision - Intermittent    Equipment Recommendations  None recommended by PT    Recommendations for Other Services OT consult     Precautions / Restrictions Precautions Precautions: Fall Restrictions Weight Bearing Restrictions: No      Mobility  Bed Mobility Overal bed mobility: Modified Independent             General bed mobility comments: Use of rails and increased time  Transfers Overall transfer level: Needs assistance Equipment used: None Transfers: Sit to/from Stand Sit to Stand: Min guard         General transfer comment: Cues for safest technique  Ambulation/Gait Ambulation/Gait assistance: Min assist Gait Distance (Feet): 40 Feet Assistive device: Rolling walker (2 wheeled) Gait Pattern/deviations: Step-through pattern;Wide base of support Gait velocity: decreased, not formally measured   General Gait Details: Pt running into objects with walker, states this is why he prefers cane and cane walk without device if needed.   Stairs            Wheelchair Mobility    Modified Rankin (Stroke Patients Only)       Balance Overall  balance assessment: Mild deficits observed, not formally tested                                           Pertinent Vitals/Pain Pain Assessment: 0-10 Pain Score: 8  Pain Location: stomach area Pain Descriptors / Indicators: Constant Pain Intervention(s): Limited activity within patient's tolerance    Home Living Family/patient expects to be discharged to:: Private residence Living Arrangements: Alone   Type of Home: Apartment Home Access: Stairs to enter Entrance Stairs-Rails: Psychiatric nurse of Steps: 12 Home Layout: One level Home Equipment: Cane - single point      Prior Function Level of Independence: Independent with assistive device(s)         Comments: Reports he uses the cane on stairs and outdoors. and that he gets winded with stairs.      Hand Dominance        Extremity/Trunk Assessment   Upper Extremity Assessment Upper Extremity Assessment: Defer to OT evaluation    Lower Extremity Assessment Lower Extremity Assessment: Overall WFL for tasks assessed    Cervical / Trunk Assessment Cervical / Trunk Assessment: Kyphotic  Communication   Communication: No difficulties  Cognition Arousal/Alertness: Awake/alert Behavior During Therapy: WFL for tasks assessed/performed(Possibly some impulsive behaviors) Overall Cognitive Status: Within Functional Limits for tasks assessed  General Comments General comments (skin integrity, edema, etc.): No family present    Exercises     Assessment/Plan    PT Assessment Patient needs continued PT services  PT Problem List Decreased activity tolerance;Decreased mobility;Decreased balance;Decreased knowledge of use of DME;Pain       PT Treatment Interventions DME instruction;Gait training;Stair training;Therapeutic activities;Therapeutic exercise;Balance training;Patient/family education    PT Goals (Current goals can be  found in the Care Plan section)  Acute Rehab PT Goals Patient Stated Goal: To be home by Nov 3rd to pay the rent and to get his stomach better PT Goal Formulation: With patient Time For Goal Achievement: 07/23/19 Potential to Achieve Goals: Good    Frequency Min 3X/week   Barriers to discharge        Co-evaluation               AM-PAC PT "6 Clicks" Mobility  Outcome Measure Help needed turning from your back to your side while in a flat bed without using bedrails?: A Little Help needed moving from lying on your back to sitting on the side of a flat bed without using bedrails?: A Little Help needed moving to and from a bed to a chair (including a wheelchair)?: A Little Help needed standing up from a chair using your arms (e.g., wheelchair or bedside chair)?: A Little Help needed to walk in hospital room?: A Little Help needed climbing 3-5 steps with a railing? : A Little 6 Click Score: 18    End of Session Equipment Utilized During Treatment: Gait belt Activity Tolerance: Patient tolerated treatment well Patient left: in chair;with call bell/phone within reach;with chair alarm set Nurse Communication: Mobility status PT Visit Diagnosis: Difficulty in walking, not elsewhere classified (R26.2)    Time: [id number] PT Time Calculation (min) (ACUTE ONLY): 24 min   Charges:   PT Evaluation $PT Eval Moderate Complexity: 1 Mod PT Treatments $Gait Training: 8-22 mins        Lavonia Dana, PT   Acute Rehabilitation Services  Pager [phone] Office [phone] 07/16/2019   Melvern Banker 07/16/2019, 10:34 AM

## 2019-07-16 NOTE — Progress Notes (Signed)
FPTS Interim Progress Note  S: Received page from [employer] at 11:24pm that patient choked or aspirated on some water and then vomited. She was rightfully concerned the patient may have aspirated. I visited Mr. Ridings in his room where RN Caren Griffins was at bedside tending to the patient, who had already been cleaned up and changed to a clean gown.  O: BP (!) 118/93 (BP Location: Right Arm)   Pulse (!) 117   Temp 98.5 F (36.9 C) (Oral)   Resp (!) 21   Ht 5\' 7"  (1.702 m)   Wt 77.1 kg   SpO2 99%   BMI 26.63 kg/m   Physical Exam General: patient is nontoxic appearing, mildly diaphoretic or clammy but the room itself is also warm, no apparent distress Cardiac: RRR, S1S2 present, no murmurs appreciated Respiratory: CTA bilaterally, moving air well, appreciating coarse upper airway sounds Neuro: patient is conversing and interacting appropriately, was observed moving upper extremities equally, no focal deficits appreciated, cranial nerves grossly intact  A/P: Concern for Aspiration: patient most likely experienced post-tussive emesis after choking on or aspirating water. There's a possibility he may have aspirated either food, drink, or emesis during this episode. -No more food or drink for the evening (patient already received his night time meds)  -Will continue to monitor patient's vitals for signs of respiratory distress, fever -Will encourage incentive spirometry to avoid pneumonia -Will order chest xray at the first sign of any decline in status, change in vitals, or increase in respiratory effort   Daisy Floro, DO 07/16/2019, 11:54 PM PGY-2, [facility] pager [phone]

## 2019-07-16 NOTE — Evaluation (Signed)
Occupational Therapy Evaluation Patient Details Name: Calvin [name] MRN: [id number] DOB: April 03, 1961 Today's Date: 07/16/2019    History of Present Illness Pt admitted 07/15/2019 with paracentesis and anemia.  PMH is significant for alcohol abuse, hyperlipidemia, hypertension, possible seizures, chronic pain, history of TIA.   Clinical Impression   Pt admitted with paracentesis and anemia. Pt currently with functional limitations due to the deficits listed below (see OT Problem List). Pt was able to complete bed mobility with increase time due to stomach aching, sit to stand transfer with supervision, ambulation with supervision and RW and standing hygiene with supervision. Pt reports they have a private caregiver who will come to complete IADLS in home. Pt will benefit from skilled OT to increase their safety and independence with ADL and functional mobility for ADL to facilitate discharge to venue listed below.       Follow Up Recommendations  Home health OT;Supervision - Intermittent    Equipment Recommendations  Tub/shower seat    Recommendations for Other Services       Precautions / Restrictions Precautions Precautions: Fall Restrictions Weight Bearing Restrictions: No      Mobility Bed Mobility Overal bed mobility: Modified Independent             General bed mobility comments: Use of rails and increased time  Transfers Overall transfer level: Needs assistance Equipment used: Rolling walker (2 wheeled) Transfers: Sit to/from Stand Sit to Stand: Min guard         General transfer comment: Cues for safest technique    Balance Overall balance assessment: Mild deficits observed, not formally tested                                         ADL either performed or assessed with clinical judgement   ADL Overall ADL's : Needs assistance/impaired Eating/Feeding: Independent;Sitting   Grooming: Wash/dry hands;Min guard;Standing   Upper  Body Bathing: Supervision/ safety;Sitting   Lower Body Bathing: Sit to/from stand;Min guard   Upper Body Dressing : Modified independent;Sitting   Lower Body Dressing: Min guard;Sit to/from stand   Toilet Transfer: Min guard;Cueing for safety;Cueing for sequencing   Toileting- Water quality scientist and Hygiene: Min guard;Sit to/from stand   Tub/ Shower Transfer: Min guard;Shower seat   Functional mobility during ADLs: Supervision/safety;Cueing for safety;Cueing for sequencing;Rolling walker       Vision         Perception Perception Perception Tested?: No   Praxis Praxis Praxis tested?: Not tested    Pertinent Vitals/Pain Pain Assessment: 0-10 Pain Score: 1  Pain Location: stomach area Pain Descriptors / Indicators: Constant Pain Intervention(s): Limited activity within patient's tolerance     Hand Dominance Right   Extremity/Trunk Assessment Upper Extremity Assessment Upper Extremity Assessment: Generalized weakness   Lower Extremity Assessment Lower Extremity Assessment: Defer to PT evaluation   Cervical / Trunk Assessment Cervical / Trunk Assessment: Kyphotic   Communication Communication Communication: No difficulties   Cognition Arousal/Alertness: Awake/alert Behavior During Therapy: WFL for tasks assessed/performed Overall Cognitive Status: Within Functional Limits for tasks assessed                                     General Comments  No family present    Exercises     Shoulder Instructions      Home Living  Family/patient expects to be discharged to:: Private residence Living Arrangements: Alone Available Help at Discharge: Personal care attendant Type of Home: Apartment Home Access: Stairs to enter [employer] of Steps: 12 Entrance Stairs-Rails: Right;Left Home Layout: One level               Home Equipment: [place] - single point          Prior Functioning/Environment Level of Independence:  Independent with assistive device(s)        Comments: Reports he uses the cane on stairs and outdoors. and that he gets winded with stairs.         OT Problem List: Decreased strength;Decreased range of motion;Decreased activity tolerance;Decreased safety awareness;Decreased knowledge of use of DME or AE;Pain      OT Treatment/Interventions: Self-care/ADL training;Therapeutic exercise;Energy conservation;Therapeutic activities;Patient/family education;Balance training    OT Goals(Current goals can be found in the care plan section) Acute Rehab OT Goals Patient Stated Goal: To go home stronger OT Goal Formulation: With patient Time For Goal Achievement: 07/23/19 Potential to Achieve Goals: Good ADL Goals Pt Will Perform Lower Body Dressing: Independently;sit to/from stand Pt Will Transfer to Toilet: Independently;ambulating Pt Will Perform Tub/Shower Transfer: Shower transfer;with modified independence  OT Frequency: Min 2X/week   Barriers to D/C:            Co-evaluation              AM-PAC OT "6 Clicks" Daily Activity     Outcome Measure Help from another person eating meals?: None Help from another person taking care of personal grooming?: None Help from another person toileting, which includes using toliet, bedpan, or urinal?: None Help from another person bathing (including washing, rinsing, drying)?: A Little Help from another person to put on and taking off regular upper body clothing?: None Help from another person to put on and taking off regular lower body clothing?: None 6 Click Score: 23   End of Session Equipment Utilized During Treatment: Gait belt;Rolling walker Nurse Communication: Mobility status  Activity Tolerance: Patient limited by fatigue Patient left: in bed;with bed alarm set  OT Visit Diagnosis: Unsteadiness on feet (R26.81);Muscle weakness (generalized) (M62.81);Pain Pain - part of body: (stomache)                Time: [id number] OT  Time Calculation (min): 23 min Charges:  OT General Charges $OT Visit: 1 Visit OT Evaluation $OT Eval Low Complexity: 1 Low OT Treatments $Self Care/Home Management : 8-22 mins  Joeseph Amor OTR/L  Acute Rehab Services  [phone] office number [phone] pager number   Joeseph Amor 07/16/2019, 1:49 PM

## 2019-07-17 DIAGNOSIS — K7031 Alcoholic cirrhosis of liver with ascites: Secondary | ICD-10-CM | POA: Diagnosis not present

## 2019-07-17 LAB — COMPREHENSIVE METABOLIC PANEL
ALT: 21 U/L (ref 0–44)
AST: 86 U/L — ABNORMAL HIGH (ref 15–41)
Albumin: 1.8 g/dL — ABNORMAL LOW (ref 3.5–5.0)
Alkaline Phosphatase: 143 U/L — ABNORMAL HIGH (ref 38–126)
Anion gap: 7 (ref 5–15)
BUN: <5 mg/dL — ABNORMAL LOW (ref 6–20)
CO2: 21 mmol/L — ABNORMAL LOW (ref 22–32)
Calcium: 8.7 mg/dL — ABNORMAL LOW (ref 8.9–10.3)
Chloride: 106 mmol/L (ref 98–111)
Creatinine, Ser: 1.32 mg/dL — ABNORMAL HIGH (ref 0.61–1.24)
GFR calc Af Amer: >60 mL/min (ref >60)
GFR calc non Af Amer: 59 mL/min — ABNORMAL LOW (ref >60)
Glucose, Bld: 119 mg/dL — ABNORMAL HIGH (ref 70–99)
Potassium: 3.9 mmol/L (ref 3.5–5.1)
Sodium: 134 mmol/L — ABNORMAL LOW (ref 135–145)
Total Bilirubin: 2.7 mg/dL — ABNORMAL HIGH (ref 0.3–1.2)
Total Protein: 7 g/dL (ref 6.5–8.1)

## 2019-07-17 LAB — GLUCOSE, CAPILLARY: Glucose-Capillary: 118 mg/dL — ABNORMAL HIGH (ref 70–99)

## 2019-07-17 MED ORDER — PREDNISOLONE 5 MG PO TABS: 40.0000 mg | ORAL_TABLET | Freq: Every day | ORAL | 0 refills | Status: DC

## 2019-07-17 MED ORDER — SPIRONOLACTONE 100 MG PO TABS: 100.0000 mg | ORAL_TABLET | Freq: Every day | ORAL | 0 refills | Status: DC

## 2019-07-17 MED ORDER — PREDNISOLONE 5 MG PO TABS
40.0000 mg | ORAL_TABLET | Freq: Every day | ORAL | Status: DC
  Administered 2019-07-17: 40 mg via ORAL
  Filled 2019-07-17: qty 8

## 2019-07-17 MED ORDER — LACTULOSE 10 GM/15ML PO SOLN: 10.0000 g | Freq: Two times a day (BID) | ORAL | 0 refills | Status: DC

## 2019-07-17 MED ORDER — FUROSEMIDE 40 MG PO TABS: 40.0000 mg | ORAL_TABLET | Freq: Every day | ORAL | 0 refills | Status: DC

## 2019-07-17 NOTE — Progress Notes (Signed)
Pt given discharge instructions, prescriptions, and care notes.  Explained discharge instructions to patient and his cousin Jackelyn Poling over the phone as she will be helping with his care. Pt and cousin verbalized understanding AEB no further questions or concerns at this time. IV was discontinued, no redness, pain, or swelling noted at this time. Telemetry discontinued and Centralized Telemetry was notified. Pt left the floor via wheelchair with staff in stable condition.

## 2019-07-17 NOTE — Progress Notes (Addendum)
2326 Pt had episode of choking on water and coughing  which led to vomiting, elevated HOB and cleaned pt up . Family medicine was paged to report incident and see if CXR was indicated, awaiting return call.  2334 Dr Ouida Sills came to see pt and evaluated, will give and instruct pt on an incentive spirometer to aerate lungs and monitor for respiratory distress and notify physician at any change in pt status.  2345 Pt sitting in high [place]'s at this time instructed on use of I/S and pt reached level 1500 x 10 times. Watching the football game on tv. Will cont to monitor, call  bell and personal belongings within reach.

## 2019-07-17 NOTE — Care Management (Signed)
RW to be delivered to room prior to DC. Patient declined [place] services.

## 2019-07-17 NOTE — Progress Notes (Signed)
Progress Note   Subjective  Chief Complaint: Decompensated cirrhosis with ascites  Today, the patient tells me that he is feeling well, though he is mumbling his words.  Denies further vomiting.  Denies any new complaints or concerns.   Objective   Vital signs in last 24 hours: Temp:  [97.9 F (36.6 C)-99.1 F (37.3 C)] 97.9 F (36.6 C) (10/25 0758) Pulse Rate:  [104-117] 104 (10/25 0329) Resp:  [18-31] 23 (10/25 0329) BP: (111-123)/(77-93) 113/87 (10/25 0329) SpO2:  [96 %-100 %] 96 % (10/25 0329) Weight:  [78.9 kg] 78.9 kg (10/25 0329) Last BM Date: 06/24/19(Per patient, poor intake.) General:   Ill appearing AA male in NAD Heart:  Regular rate and rhythm; no murmurs Lungs: Respirations even and unlabored, lungs CTA bilaterally Abdomen:  Soft, mild generalized ttp, mild distension. Normal bowel sounds. Extremities:  Without edema. Neurologic:  Alert and oriented,  grossly normal neurologically. Psych:  Cooperative. Normal mood and affect.  Intake/Output from previous day: 10/24 0701 - 10/25 0700 In: 9.4 [IV Piggyback:9.4] Out: 1150 [Urine:1150]  Lab Results: Recent Labs    07/15/19 1122 07/16/19 0242  WBC 9.2 9.0  HGB 7.8* 7.3*  HCT 22.8* 20.5*  PLT 209 164   BMET Recent Labs    07/15/19 1122 07/16/19 0242 07/17/19 0237  NA 139 138 134*  K 3.8 4.0 3.9  CL 110 111 106  CO2 19* 20* 21*  GLUCOSE 111* 97 119*  BUN <5* 5* <5*  CREATININE 1.00 1.11 1.32*  CALCIUM 8.8* 8.7* 8.7*   LFT Recent Labs    07/17/19 0237  PROT 7.0  ALBUMIN 1.8*  AST 86*  ALT 21  ALKPHOS 143*  BILITOT 2.7*   PT/INR Recent Labs    07/16/19 0242  LABPROT 22.6*  INR 2.0*    Studies/Results: Ct Abdomen Pelvis Wo Contrast  Result Date: 07/15/2019 CLINICAL DATA:  Small bowel obstruction. EXAM: CT ABDOMEN AND PELVIS WITHOUT CONTRAST TECHNIQUE: Multidetector CT imaging of the abdomen and pelvis was performed following the standard protocol without IV contrast.  COMPARISON:  None. FINDINGS: Lower chest: No acute abnormality. Hepatobiliary: Large gallstone is noted. No biliary dilatation is noted. Hepatic low density is noted suggesting hepatic steatosis. Pancreas: Unremarkable. No pancreatic ductal dilatation or surrounding inflammatory changes. Spleen: Fluid is noted around the spleen.  Otherwise unremarkable. Adrenals/Urinary Tract: Adrenal glands appear normal. Small nonobstructive right renal calculus is noted. No hydronephrosis or renal obstruction is noted. Urinary bladder is unremarkable. Stomach/Bowel: Stomach is within normal limits. Appendix appears normal. No evidence of bowel wall thickening, distention, or inflammatory changes. Vascular/Lymphatic: Aortic atherosclerosis. No enlarged abdominal or pelvic lymph nodes. Reproductive: Prostate is unremarkable. Other: Mild to moderate ascites is noted in the around the liver, spleen and throughout the remaining abdomen and pelvis. Moderate size fluid-filled right inguinal hernia is noted. Small left inguinal hernia is noted. Musculoskeletal: Status post left hip arthroplasty. Bilateral L5 spondylolysis is noted. No acute abnormality is noted. IMPRESSION: Probable hepatic steatosis or other diffuse hepatocellular disease such as cirrhosis. Large solitary gallstone is noted without inflammation or biliary dilatation. Mild to moderate ascites is noted. Small nonobstructive right renal calculus. No hydronephrosis or renal obstruction is noted. Bilateral inguinal hernias are noted, right greater than left. Aortic Atherosclerosis (ICD10-I70.0). Electronically Signed   By: Marijo Conception M.D.   On: 07/15/2019 12:03   US Paracentesis  Result Date: 07/16/2019 INDICATION: Acute hepatitis with ascites. Request for diagnostic and therapeutic paracentesis. EXAM: ULTRASOUND GUIDED LEFT LOWER QUADRANT  PARACENTESIS MEDICATIONS: None. COMPLICATIONS: None immediate. PROCEDURE: Informed written consent was obtained from the  patient after a discussion of the risks, benefits and alternatives to treatment. A timeout was performed prior to the initiation of the procedure. Initial ultrasound scanning demonstrates a moderate amount of ascites within the left lower abdominal quadrant. The left lower abdomen was prepped and draped in the usual sterile fashion. 1% lidocaine was used for local anesthesia. Following this, a 6 Fr Safe-T-Centesis catheter was introduced. An ultrasound image was saved for documentation purposes. The paracentesis was performed. The catheter was removed and a dressing was applied. The patient tolerated the procedure well without immediate post procedural complication. FINDINGS: A total of approximately 1.5 L of clear yellow fluid was removed. Samples were sent to the laboratory as requested by the clinical team. IMPRESSION: Successful ultrasound-guided paracentesis yielding 1.5 liters of peritoneal fluid. Read by: Ascencion Dike PA-C Electronically Signed   By: Jacqulynn Cadet M.D.   On: 07/16/2019 12:18   Dg Chest Port 1 View  Result Date: 07/15/2019 CLINICAL DATA:  Shortness of breath. EXAM: PORTABLE CHEST 1 VIEW COMPARISON:  Feb 15, 2018. FINDINGS: The heart size and mediastinal contours are within normal limits. No pneumothorax or pleural effusion is noted. Right lung is clear. Minimal left basilar subsegmental atelectasis is noted. The visualized skeletal structures are unremarkable. IMPRESSION: Minimal left basilar subsegmental atelectasis. Electronically Signed   By: Marijo Conception M.D.   On: 07/15/2019 12:28       Assessment / Plan:   Assessment: 1.  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  Anemia: Hemoglobin 7.8 (baseline around 12.8)--> 7.3, no overt GI bleeding, normocytic, already on folate, thiamine and MVI 3.   Constipation: No bowel movement in the past week, now status post 2 days of MiraLAX  Plan: 1.  Still awaiting cytology, repeat albumin still less than 1, lab unable to give [place] a more exact value; again pending labs will consider prednisolone 40 mg once daily for a 30-day course 2.  Continue CIWA protocol 3.  Continue MiraLAX 4.  Please await any further recommendations from Dr. Ardis Hughs later today  Thank you for your kind consultation, we will continue to follow along.   LOS: 2 days   Levin Erp  07/17/2019, 10:14 AM

## 2019-07-17 NOTE — Discharge Instructions (Signed)
You were admitted to the hospital for liver failure.  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  While you were here, you were seen by a gastroenterologist (stomach and liver doctor) who plans to continue to treat you in the outpatient setting.  We have started you on daily steroids (prednisone 40 mg daily) which you should take for the next 30 days.  This will help improve your ascites and other symptoms.  It is incredibly important for you to abstain from alcohol from this point forward.  Alcohol will only worsen your liver problem.  Alcoholic Liver Disease Alcoholic liver disease refers to liver damage that is caused by drinking a lot of alcohol over a long period of time. The liver is an organ that:  Helps to divide (break down) and absorb fats and other nutrients in the blood.  Helps to remove harmful substances from the blood.  Makes the parts of the blood that help to form clots and prevent excessive bleeding. Damage may cause the liver to stop working properly. There are three main types of alcoholic liver disease, and they usually occur in the following order. 1. Fatty liver disease. This is considered the early stage of alcoholic liver disease. It is a condition in which too much fat has built up in the liver cells. In many cases, fatty liver disease does not cause any symptoms. It is often diagnosed when lab tests are done for other reasons. 2. Alcoholic hepatitis. This is liver inflammation that decreases the liver's ability to function normally. 3. Alcoholic cirrhosis. Cirrhosis is long-term (chronic) liver injury. Having cirrhosis means that many healthy liver cells have been replaced by scar tissue. This prevents blood from flowing through the liver, which makes it difficult for the liver to function. Symptoms of this condition usually get worse (progress) over time if alcohol use  continues. Treatment requires stopping all alcohol intake. What are the causes? Alcoholic liver disease is caused by long-term (chronic) heavy alcohol use. The liver filters alcohol out of the bloodstream. When alcohol gets broken down in the liver, it releases poisonous (toxic) chemicals that damage liver cells. What increases the risk? This condition is more likely to develop in:  Women.  People who have a family history of alcoholic liver disease.  People who have poor nutrition.  People who are obese.  People who drink a lot of alcohol, especially people who often drink a lot during short periods of time (binge drink).  People who have an underlying liver disease such as hepatitis B or hepatitis C.  People who lack certain nutrients, such as folate or thiamine (have nutrient deficiencies). What are the signs or symptoms? Most people do not have symptoms in the early stages of this disease. If early symptoms do develop, they may include:  Loss of appetite.  Nausea and vomiting. Symptoms of moderate disease include:  Loss of appetite.  Nausea and vomiting.  Diarrhea.  Weight loss without trying.  Fatigue.  Yellowing of the skin and the whites of the eyes (jaundice).  Pain and swelling in the abdomen.  Fever. Symptoms of advanced disease include:  Weight loss and muscle loss.  Itchy skin.  Buildup of fluid in the abdomen (ascites).  Swelling of the feet and ankles (edema).  Nosebleeds or bleeding gums.  Bloody bowel movements.  Enlarged fingertips (clubbing).  Trouble concentrating.  Trouble sleeping.  Mood  changes.  Agitation.  Confusion. Some people do not have symptoms until the condition becomes severe. Symptoms often get worse right after a period of heavy drinking. How is this diagnosed? This condition may be diagnosed based on:  A physical exam.  Blood tests.  Tests that create detailed images of the body. These may include: ? Liver  ultrasound. ? CT scan. ? MRI.  A liver biopsy. For this test, a small sample of liver tissue is removed and checked for signs of damage. How is this treated? The most important part of treatment is to stop drinking alcohol. If you are addicted to alcohol, your health care provider will help you make a plan to quit. This plan may involve:  Taking medicine to decrease unpleasant symptoms that are caused by stopping or decreasing alcohol use (withdrawal symptoms).  Entering a treatment program to help you stop drinking.  Joining a support group. Treatment for alcoholic liver disease may also include:  Nutritional therapy. Your health care provider or a diet and nutrition specialist (dietitian) may recommend: ? Eating a healthy diet. ? Taking vitamins. ? Eating foods that contain a lot of zinc or B vitamins such as folate, thiamine, or pyridoxine.  Steroid medicines to reduce liver inflammation. These may be recommended if your disease is severe.  Receiving a donated liver (liver transplant). This is only done in very severe cases, and only for people who have completely stopped drinking and can commit to never drinking alcohol again. Follow these instructions at home:   Do not drink alcohol. Follow your treatment plan, and work with your health care provider as needed.  Consider joining an alcohol support group. These groups can provide emotional support and guidance.  Take over-the-counter and prescription medicines only as told by your health care provider. These include vitamins and supplements.  Do not use medicines or eat foods that contain alcohol unless told by your health care provider.  Follow instructions from your health care provider or dietitian about eating a healthy diet.  Keep all follow-up visits as told by your health care provider. This is important. Contact a health care provider if:  You develop a fever.  Your skin color becomes more yellow, pale, or  dark.  You develop headaches. Get help right away if:  You vomit blood.  You have bright red blood in your stool.  You have black, tarry stools.  You have trouble: ? Thinking. ? Walking. ? Balancing. ? Breathing. Summary  Alcoholic liver disease refers to liver damage that is caused by drinking a lot of alcohol over a long period of time.  Symptoms of this condition usually get worse (progress) over time if alcohol use continues.  Your health care provider will do a physical exam and tests to diagnose your condition.  The most important part of treatment is to stop drinking alcohol. Follow your treatment plan, and work with your health care provider as needed. This information is not intended to replace advice given to you by your health care provider. Make sure you discuss any questions you have with your health care provider. Document Released: 09/29/2014 Document Revised: 12/28/2018 Document Reviewed: 05/21/2017 Elsevier Patient Education  2020 [employer].

## 2019-07-17 NOTE — Progress Notes (Signed)
Family Medicine Teaching Service Daily Progress Note Intern Pager: [phone]  Patient name: Calvin [name] Medical record number: [id number] Date of birth: [birth year] Age: 58 y.o. Gender: male  Primary Care Provider: Javier Docker, MD Consultants: GI Code Status: Full  Pt Overview and Major Events to Date:  10/23 admitted, diagnostic paracentesis  Assessment and Plan: Calvin [name] is a 58 y.o. male  here with liver failure secondary to alcoholic cirrhosis and hepatitis C. PMH is significant for alcohol abuse, hyperlipidemia, hypertension, history of TIA.  Ascites potentially secondary to decompensated cirrhosis, INR 2.0, meld 19 Most likely secondary to prolonged alcohol use and hepatitis C.  1.5 L therapeutic paracentesis performed 10/24.  [name] feels significantly improved this morning and would like to go home when able. -GI following, appreciate recommendations -f/u Cytology, cultures from diagnostic paracentesis -spironolactone 100 mg daily.   -Lasix 40 mg daily -Follow-up hepatitis C quantitative  -We will likely begin a 30-day course of prednisone 40 mg daily once SBP can be ruled out  Creatinine elevation Creatinine elevated to 1.3 from 1.1 on 10/24.  Likely secondary to new diuretics Lasix and spironolactone.  We will continue to monitor for now and encourage adequate p.o. intake. -Continue to monitor  Hyperammonemia Ammonia elevated to 70.  Alert and oriented to person, place and president (thought that the year was 2024).  Potentially mildly encephalopathic.  He notes constipation.  Lactulose started 10/24. -Lactulose twice daily  Alcohol withdrawal Patient states he drinks 3 pints of wine per day typically.  Last drink 10/22.  CIWA is 2, 0, 0 overnight.  CIWA score of 2 on exam this morning.  Ativan 1 mg given overnight. -Monitor CIWA's -Seizure precautions -Ativan as needed. -Thiamine 123XX123 mg daily -Folic acid 1 mg daily   Anemia, normocytic.   Decreasing slightly to 7.3 morning of 10/24 Hemoglobin of 7.8 on this admission, seems patient baseline is around 12.8-13.7 in past. Current MCV of 95.  No report of hematochezia or obvious blood loss during admission. Normal iron, Low TIBC,high sat, high ferritin -f/u smear -Follow-up FOBT -Continue to monitor  Hypertension Mildly elevated systolic pressures in the past 24 hours up to 97. -No medication at this time, consider outpatient antihypertensive therapy  Marijuana use -Recommend cessation  Nicotine use disorder -Nicotine patch  Chronic pain Home medications include percocet 10-325 every 4 hours as needed.  -Oxycodone 5mg  every 4 hours as needed  Constipation: -MiraLAX   FEN/GI: salt restriction diet Prophylaxis: Lovenox  Disposition: Possible discharge today or tomorrow (10/26).  Subjective:  An episode overnight during which [name] and choked while drinking water.  No additional events overnight.  Overall, he feels improved this morning and would like to know when he will be able to go home.  Objective: Temp:  [97.9 F (36.6 C)-99.1 F (37.3 C)] 97.9 F (36.6 C) (10/25 0758) Pulse Rate:  [104-117] 104 (10/25 0329) Resp:  [18-31] 23 (10/25 0329) BP: (111-123)/(77-93) 113/87 (10/25 0329) SpO2:  [96 %-100 %] 96 % (10/25 0329) Weight:  [78.9 kg] 78.9 kg (10/25 0329)  Physical Exam: General: Alert and cooperative and appears to be in no acute distress HEENT: Neck non-tender without lymphadenopathy, masses or thyromegaly Cardio: Normal S1 and S2, no S3 or S4. Rhythm is regular. No murmurs or rubs. Pulm: Clear to auscultation bilaterally, no crackles, wheezing, or diminished breath sounds. Normal respiratory effort Abdomen: Bowel sounds normal. Abdomen considerably softer compared to admission the still moderately distended. Extremities: No peripheral edema. Warm/  well perfused.  Strong radial pulse. Neuro: Cranial nerves grossly  intact   Laboratory: Recent Labs  Lab 07/15/19 1122 07/16/19 0242  WBC 9.2 9.0  HGB 7.8* 7.3*  HCT 22.8* 20.5*  PLT 209 164   Recent Labs  Lab 07/15/19 1122 07/16/19 0242 07/17/19 0237  NA 139 138 134*  K 3.8 4.0 3.9  CL 110 111 106  CO2 19* 20* 21*  BUN <5* 5* <5*  CREATININE 1.00 1.11 1.32*  CALCIUM 8.8* 8.7* 8.7*  PROT 7.8 7.0 7.0  BILITOT 3.3* 3.1* 2.7*  ALKPHOS 164* 152* 143*  ALT 25 22 21   AST 107* 87* 86*  GLUCOSE 111* 97 119*    Imaging/Diagnostic Tests: US Paracentesis  Result Date: 07/16/2019 INDICATION: Acute hepatitis with ascites. Request for diagnostic and therapeutic paracentesis. EXAM: ULTRASOUND GUIDED LEFT LOWER QUADRANT PARACENTESIS MEDICATIONS: None. COMPLICATIONS: None immediate. PROCEDURE: Informed written consent was obtained from the patient after a discussion of the risks, benefits and alternatives to treatment. A timeout was performed prior to the initiation of the procedure. Initial ultrasound scanning demonstrates a moderate amount of ascites within the left lower abdominal quadrant. The left lower abdomen was prepped and draped in the usual sterile fashion. 1% lidocaine was used for local anesthesia. Following this, a 6 Fr Safe-T-Centesis catheter was introduced. An ultrasound image was saved for documentation purposes. The paracentesis was performed. The catheter was removed and a dressing was applied. The patient tolerated the procedure well without immediate post procedural complication. FINDINGS: A total of approximately 1.5 L of clear yellow fluid was removed. Samples were sent to the laboratory as requested by the clinical team. IMPRESSION: Successful ultrasound-guided paracentesis yielding 1.5 liters of peritoneal fluid. Read by: Ascencion Dike PA-C Electronically Signed   By: Jacqulynn Cadet M.D.   On: 07/16/2019 12:18    Matilde Haymaker, MD 07/17/2019, 8:57 AM PGY-2, [place] Intern pager: [phone], text pages  welcome

## 2019-07-18 ENCOUNTER — Telehealth: Payer: Self-pay

## 2019-07-18 LAB — HEPATITIS B SURFACE ANTIBODY, QUANTITATIVE: Hep B S AB Quant (Post): <3.1 m[IU]/mL — ABNORMAL LOW (ref >9.9)

## 2019-07-18 LAB — CYTOLOGY - NON PAP

## 2019-07-18 MED ORDER — PREDNISONE 20 MG PO TABS: 40.0000 mg | ORAL_TABLET | Freq: Every day | ORAL | 0 refills | Status: DC

## 2019-07-18 NOTE — Telephone Encounter (Signed)
I oversaw the prescription of this medication.  Matilde Haymaker, MD

## 2019-07-18 NOTE — Telephone Encounter (Signed)
Pharmacy calls nurse line stating Prednisolone is not covered by his insurance and they do not even have in stock. Pharmacy is requesting an alternative, Prednisone or Methylprednisolone. Please advise.

## 2019-07-18 NOTE — Telephone Encounter (Signed)
Per Pilar Plate, ok to change to Prednisone 40mg .

## 2019-07-19 LAB — HEPATITIS C GENOTYPE

## 2019-07-19 LAB — HCV RNA QUANT RFLX ULTRA OR GENOTYP
HCV RNA Qnt(log copy/mL): 5.384 log10 IU/mL
HepC Qn: 242000 IU/mL

## 2019-07-20 LAB — CULTURE, BODY FLUID W GRAM STAIN -BOTTLE: Culture: NO GROWTH

## 2019-07-22 NOTE — Discharge Summary (Signed)
[facility] Discharge Summary  Patient name: Calvin [name] record number: [id number] Date of birth: 12-27-1960 Age: 58 y.o. Gender: male Date of Admission: 07/15/2019  Date of Discharge: 07/17/2019 Admitting Physician: Blane Ohara McDiarmid, MD  Primary Care Provider: Jenny Reichmann, PA-C Consultants: GI  Indication for Hospitalization: decompensated cirrhosis  Discharge Diagnoses/Problem List:  Decompensated cirrhosis Ascites AKI Hyperammonemia Alcohol withdrawal Anemia, normocytic Hypertension Marijuana use Nicotine use disorder Chronic pain Constipation  Disposition: DC home  Discharge Condition:  Stable  Discharge Exam:  General: Alert and cooperative and appears to be in no acute distress HEENT: Neck non-tender without lymphadenopathy, masses or thyromegaly Cardio: Normal S1 and S2, no S3 or S4. Rhythm is regular. No murmurs or rubs. Pulm: Clear to auscultation bilaterally, no crackles, wheezing, or diminished breath sounds. Normal respiratory effort Abdomen: Bowel sounds normal. Abdomen considerably softer compared to admission the still moderately distended. Extremities: No peripheral edema. Warm/ well perfused.  Strong radial pulse. Neuro: Cranial nerves grossly intact  Brief Hospital Course:   Decompensated cirrhosis secondary to long-term alcohol use hepatitis C Mr. Leverette presented to the ED with decreased appetite, abdominal pain, nausea, vomiting.  He is found to have acute liver failure with ascites.  Diagnostic tap was performed and is found to be consistent with portal venous hypertension without evidence of infection or malignancy.  He was started on a diuretic regimen of spironolactone 100 mg daily and Lasix 40 mg daily.  Due to his discriminate function score he is also started on a 30-day course of prednisolone 40 mg based on GI recommendation.  He has plans to follow-up in GI clinic in 2 to 3 weeks.  He plans to  quit drinking alcohol.  Hepatitis C positive During this hospitalization, he was found to have a positive hepatitis C screen.  This is followed up with a hep C quantitation that showed a viral load of 242,000 and a HCVRNA quant of 5.384.  GI is aware and will follow up in clinic.  Anemia, normocytic, chronic disease Ferritin 733, iron 133.  Consistent with iron deficiency of chronic disease.  Potentially secondary to the above-mentioned hepatitis C and alcoholic cirrhosis.  Hemoglobin low but stable during hospitalization.  7.3 at time of discharge.  Issues for Follow Up:  1. Check BMP to assess potassium 2. Abdominal exam to assess ascites buildup 3. Ensure good follow-up with GI 4. Encourage low-sodium (under 2 g/day) and water restricted diet. 5. Encourage alcohol cessation  Significant Procedures:  Paracentesis - no complication  Significant Labs and Imaging:  Recent Labs  Lab 07/16/19 0242  WBC 9.0  HGB 7.3*  HCT 20.5*  PLT 164   Recent Labs  Lab 07/15/19 1647 07/16/19 0242 07/17/19 0237  NA  --  138 134*  K  --  4.0 3.9  CL  --  111 106  CO2  --  20* 21*  GLUCOSE  --  97 119*  BUN  --  5* <5*  CREATININE  --  1.11 1.32*  CALCIUM  --  8.7* 8.7*  MG 1.6* 1.6*  --   PHOS 3.6  --   --   ALKPHOS  --  152* 143*  AST  --  87* 86*  ALT  --  22 21  ALBUMIN  --  1.7* 1.8*    No results found.   Results/Tests Pending at Time of Discharge: none  Discharge Medications:  Allergies as of 07/17/2019   No Known Allergies  Medication List    TAKE these medications   Ensure Plus Liqd Take 237 mLs by mouth daily.   furosemide 40 MG tablet Commonly known as: LASIX Take 1 tablet (40 mg total) by mouth daily.   lactulose 10 GM/15ML solution Commonly known as: CHRONULAC Take 15 mLs (10 g total) by mouth 2 (two) times daily.   omeprazole 20 MG capsule Commonly known as: PRILOSEC Take 20 mg by mouth daily.   oxyCODONE-acetaminophen 10-325 MG tablet Commonly  known as: PERCOCET Take 1 tablet by mouth every 4 (four) hours as needed for pain.   prednisoLONE 5 MG Tabs tablet Take 8 tablets (40 mg total) by mouth daily.   spironolactone 100 MG tablet Commonly known as: ALDACTONE Take 1 tablet (100 mg total) by mouth daily.       Discharge Instructions: Please refer to Patient Instructions section of EMR for full details.  Patient was counseled important signs and symptoms that should prompt return to medical care, changes in medications, dietary instructions, activity restrictions, and follow up appointments.   Follow-Up Appointments:   Matilde Haymaker, MD 07/22/2019, 2:02 PM PGY-2, [place]

## 2019-08-08 ENCOUNTER — Telehealth: Payer: Self-pay | Admitting: Pharmacy Technician

## 2019-08-08 NOTE — Telephone Encounter (Signed)
[facility] Patient Teacher, English as a foreign language completed.    The patient is insured through [employer].  We will continue to follow to see if copay assistance is needed.  Venida Jarvis. Nadara Mustard [place] Patient [facility] for Infectious Disease Phone: [phone] Fax:  [phone]

## 2019-08-09 ENCOUNTER — Encounter: Payer: Medicare Other | Admitting: Infectious Diseases

## 2019-08-09 DIAGNOSIS — B182 Chronic viral hepatitis C: Secondary | ICD-10-CM | POA: Insufficient documentation

## 2019-08-09 NOTE — Progress Notes (Deleted)
Patient Name: JUANDEDIOS DUDASH  Date of Birth: June 21, 1961  MRN: [id number]  PCP: Jenny Reichmann, PA-C  Referring Provider: Darra Lis*, Ph#: [phone]   Patient Active Problem List   Diagnosis Date Noted  . Alcohol withdrawal syndrome with complication ([place])   . Ascites due to alcoholic cirrhosis ([place])   . SOB (shortness of breath)   . Decompensated hepatic cirrhosis ([place]) 07/15/2019  . TIA (transient ischemic attack) 06/23/2016  . Essential hypertension 06/23/2016  . Alcohol abuse 06/23/2016  . Chronic left hip pain 06/23/2016  . Hyponatremia 06/23/2016  . Mixed hyperlipidemia   . Blind right eye     CC:  New patient - initial evaluation and management of chronic hepatitis C infection.  Recently hospitalized for liver failure.  HPI/ROS:  CASEY FYE is a 58 y.o. male is a 58 y.o. @GENDER @.  Patient tested positive {time; misc:30499}. Hepatitis C-associated risk factors present are: {hep c risks pos:13207}. Patient denies {hep c risks neg:13208}. Patient {has/not:15037} had other studies performed. Results: {hep c studies:13209}. Patient {has/not:15037} had prior treatment for Hepatitis C. Patient {does/do/not:33181} have a past history of liver disease. Patient {does/do/not:33181} have a family history of liver disease. Patient {does/do/not:33181}  have associated signs or symptoms related to liver disease.  Labs reviewed and confirm chronic hepatitis C with a positive viral load.   Records reviewed from ***    Admitted to [facility] Medicine Teaching Service at [facility] October 23 through July 17, 2019 for decompensated cirrhosis.  On presentation to the ED he had significant abdominal pain, nausea, vomiting with acute on chronic liver failure and ascites.  Diagnostic paracentesis performed consistent with portal venous hypertension without infection or malignancy.  He was started on diuretics, spironolactone 100 mg daily Lasix 40 mg daily.  He  was also started on a course of prednisone as recommended by Dr. Ardis Hughs due to discriminant function score of 52.  He does have a history of chronic alcohol intake, he plans to stop alcohol intake now.  During the hospitalization he was found to have a positive hepatitis C antibody screen with a viral load of 240,000.  He has been drinking daily for 50 years.  CT of the abdomen without contrast showed hepatic steatosis or hepatocellular disease such as cirrhosis, mild to moderate ascites, nonobstructive renal calculus, bilateral inguinal hernias.  Diagnostic paracentesis yielded 50 cc with an albumin of 1.9.  Liver function testing upon admission was elevated with AST 107, ALT 25, alk phos 164.  Patient {does/does not:19866} have documented immunity to Hepatitis A. Patient {does/does not:19867} have documented immunity to Hepatitis B.    {ros - complete:22885} All other systems reviewed and are negative      Past Medical History:  Diagnosis Date  . Chronic left hip pain   . Hypertension     Prior to Admission medications   Medication Sig Start Date End Date Taking? Authorizing Provider  Ensure Plus (ENSURE PLUS) LIQD Take 237 mLs by mouth daily.    [provider]  furosemide (LASIX) 40 MG tablet Take 1 tablet (40 mg total) by mouth daily. 07/18/19   Matilde Haymaker, MD  lactulose (CHRONULAC) 10 GM/15ML solution Take 15 mLs (10 g total) by mouth 2 (two) times daily. 07/17/19   Matilde Haymaker, MD  omeprazole (PRILOSEC) 20 MG capsule Take 20 mg by mouth daily.    [provider]  oxyCODONE-acetaminophen (PERCOCET) 10-325 MG tablet Take 1 tablet by mouth every 4 (four) hours  as needed for pain.    [provider]  prednisoLONE 5 MG TABS tablet Take 8 tablets (40 mg total) by mouth daily. 07/17/19   Matilde Haymaker, MD  predniSONE (DELTASONE) 20 MG tablet Take 2 tablets (40 mg total) by mouth daily with breakfast. 07/18/19   Matilde Haymaker, MD  spironolactone (ALDACTONE) 100 MG  tablet Take 1 tablet (100 mg total) by mouth daily. 07/18/19   Matilde Haymaker, MD    No Known Allergies  Social History   Tobacco Use  . Smoking status: Current Every Day Smoker  . Smokeless tobacco: Never Used  Substance Use Topics  . Alcohol use: Yes  . Drug use: No    No family history on file. ***  Objective:  There were no vitals filed for this visit. Constitutional: {EXAM; GENERAL APPEARANCE:5021} Eyes: anicteric Cardiovascular: {Mis exam cardio:32073} Respiratory: {Exam; lungs brief:12271} Gastrointestinal: {Exam; abdomen:5794::"Bowel sounds are normal","liver is not enlarged","spleen is not enlarged"} Musculoskeletal: {extremities:315109::"peripheral pulses normal, no pedal edema, no clubbing or cyanosis"} Skin: {Skin exam gi:12013}; no porphyria cutanea tarda Lymphatic: no cervical lymphadenopathy   Laboratory: Genotype:  Lab Results  Component Value Date   HCVGENOTYPE Comment 07/15/2019   HCV viral load: No results found for: HCVQUANT Lab Results  Component Value Date   WBC 9.0 07/16/2019   HGB 7.3 (L) 07/16/2019   HCT 20.5 (L) 07/16/2019   MCV 94.0 07/16/2019   PLT 164 07/16/2019    Lab Results  Component Value Date   CREATININE 1.32 (H) 07/17/2019   BUN <5 (L) 07/17/2019   NA 134 (L) 07/17/2019   K 3.9 07/17/2019   CL 106 07/17/2019   CO2 21 (L) 07/17/2019    Lab Results  Component Value Date   ALT 21 07/17/2019   AST 86 (H) 07/17/2019   ALKPHOS 143 (H) 07/17/2019    Lab Results  Component Value Date   INR 2.0 (H) 07/16/2019   BILITOT 2.7 (H) 07/17/2019   ALBUMIN 1.8 (L) 07/17/2019    APRI ***   FIB-4 ***   Imaging:  ***  Assessment & Plan:   Problem List Items Addressed This Visit    None      I spent 45 minutes with the patient including greater than 70% of time in face to face counsel of the patient re hepatitis c and the details described above and in coordination of their care.  Janene Madeira, MSN, NP-C [facility] for Infectious Disease [email or web address] Pager: [phone] Office: [phone] [place]: [phone]

## 2019-08-09 NOTE — Assessment & Plan Note (Deleted)
Concomitant HCV infection and alcohol abuse synergistically cause more severe liver disease, rapid progression of fibrosis, and higher prevalence of hepatocellular carcinoma.

## 2019-08-22 ENCOUNTER — Encounter: Payer: Medicare Other | Admitting: Internal Medicine

## 2019-09-24 ENCOUNTER — Emergency Department (HOSPITAL_COMMUNITY)
Admission: EM | Admit: 2019-09-24 | Discharge: 2019-09-24 | Disposition: A | Discharge status: Home / Self Care | Payer: Medicare Other | Attending: Emergency Medicine | Admitting: Emergency Medicine

## 2019-09-24 ENCOUNTER — Other Ambulatory Visit: Payer: Self-pay

## 2019-09-24 ENCOUNTER — Encounter (HOSPITAL_COMMUNITY): Payer: Self-pay | Admitting: Emergency Medicine

## 2019-09-24 DIAGNOSIS — K920 Hematemesis: Secondary | ICD-10-CM | POA: Insufficient documentation

## 2019-09-24 DIAGNOSIS — Z5321 Procedure and treatment not carried out due to patient leaving prior to being seen by health care provider: Secondary | ICD-10-CM | POA: Diagnosis not present

## 2019-09-24 HISTORY — DX: Other ascites: R18.8

## 2019-09-24 HISTORY — DX: Unspecified cirrhosis of liver: K74.60

## 2019-09-24 LAB — COMPREHENSIVE METABOLIC PANEL
ALT: 31 U/L (ref 0–44)
AST: 55 U/L — ABNORMAL HIGH (ref 15–41)
Albumin: 3 g/dL — ABNORMAL LOW (ref 3.5–5.0)
Alkaline Phosphatase: 125 U/L (ref 38–126)
Anion gap: 12 (ref 5–15)
BUN: 15 mg/dL (ref 6–20)
CO2: 16 mmol/L — ABNORMAL LOW (ref 22–32)
Calcium: 10.2 mg/dL (ref 8.9–10.3)
Chloride: 102 mmol/L (ref 98–111)
Creatinine, Ser: 1.42 mg/dL — ABNORMAL HIGH (ref 0.61–1.24)
GFR calc Af Amer: >60 mL/min (ref >60)
GFR calc non Af Amer: 53 mL/min — ABNORMAL LOW (ref >60)
Glucose, Bld: 128 mg/dL — ABNORMAL HIGH (ref 70–99)
Potassium: 5 mmol/L (ref 3.5–5.1)
Sodium: 130 mmol/L — ABNORMAL LOW (ref 135–145)
Total Bilirubin: 1 mg/dL (ref 0.3–1.2)
Total Protein: 8.2 g/dL — ABNORMAL HIGH (ref 6.5–8.1)

## 2019-09-24 LAB — CBC
HCT: 36 % — ABNORMAL LOW (ref 39.0–52.0)
Hemoglobin: 12.6 g/dL — ABNORMAL LOW (ref 13.0–17.0)
MCH: 32.9 pg (ref 26.0–34.0)
MCHC: 35 g/dL (ref 30.0–36.0)
MCV: 94 fL (ref 80.0–100.0)
Platelets: 148 10*3/uL — ABNORMAL LOW (ref 150–400)
RBC: 3.83 MIL/uL — ABNORMAL LOW (ref 4.22–5.81)
RDW: 14.1 % (ref 11.5–15.5)
WBC: 12.1 10*3/uL — ABNORMAL HIGH (ref 4.0–10.5)
nRBC: 0 % (ref 0.0–0.2)

## 2019-09-24 LAB — TYPE AND SCREEN
ABO/RH(D): O POS
Antibody Screen: NEGATIVE

## 2019-09-24 LAB — ABO/RH: ABO/RH(D): O POS

## 2019-09-24 NOTE — ED Triage Notes (Signed)
Pt presents by [facility] for evaluation for hematemesis due to drinking yesterday and hx chirossis of liver and acietes. EMS reports pt had 254ml emesis with bright red blood.

## 2019-09-24 NOTE — ED Notes (Signed)
Pt reports that he no longer wants to be seen and wants IV taken out so he can call a cab. Pt informed should anything change to return to ED.

## 2020-01-27 ENCOUNTER — Other Ambulatory Visit: Payer: Self-pay | Admitting: Gastroenterology

## 2020-01-27 DIAGNOSIS — K7011 Alcoholic hepatitis with ascites: Secondary | ICD-10-CM

## 2020-02-06 ENCOUNTER — Other Ambulatory Visit: Payer: Self-pay | Admitting: Gastroenterology

## 2020-02-06 ENCOUNTER — Other Ambulatory Visit (HOSPITAL_COMMUNITY): Payer: Self-pay | Admitting: Gastroenterology

## 2020-02-06 ENCOUNTER — Ambulatory Visit
Admission: RE | Admit: 2020-02-06 | Discharge: 2020-02-06 | Disposition: A | Discharge status: Home / Self Care | Payer: Medicare Other | Source: Ambulatory Visit | Attending: Gastroenterology | Admitting: Gastroenterology

## 2020-02-06 DIAGNOSIS — K7011 Alcoholic hepatitis with ascites: Secondary | ICD-10-CM

## 2020-02-06 DIAGNOSIS — R935 Abnormal findings on diagnostic imaging of other abdominal regions, including retroperitoneum: Secondary | ICD-10-CM

## 2020-02-17 ENCOUNTER — Other Ambulatory Visit: Payer: Self-pay

## 2020-02-17 ENCOUNTER — Encounter (HOSPITAL_COMMUNITY)
Admission: RE | Admit: 2020-02-17 | Discharge: 2020-02-17 | Disposition: A | Discharge status: Home / Self Care | Payer: Medicare Other | Source: Ambulatory Visit | Attending: Gastroenterology | Admitting: Gastroenterology

## 2020-02-17 DIAGNOSIS — R935 Abnormal findings on diagnostic imaging of other abdominal regions, including retroperitoneum: Secondary | ICD-10-CM | POA: Diagnosis not present

## 2020-02-17 MED ORDER — TECHNETIUM TC 99M MEBROFENIN IV KIT
5.2000 | PACK | Freq: Once | INTRAVENOUS | Status: AC | PRN
  Administered 2020-02-17: 5.2 via INTRAVENOUS

## 2020-03-28 ENCOUNTER — Encounter: Payer: Self-pay | Admitting: Critical Care Medicine

## 2020-04-02 ENCOUNTER — Encounter (HOSPITAL_COMMUNITY): Payer: Self-pay

## 2020-04-02 ENCOUNTER — Inpatient Hospital Stay (HOSPITAL_COMMUNITY)
Admission: EM | Admit: 2020-04-02 | Discharge: 2020-04-07 | DRG: 682 | Disposition: A | Discharge status: Skilled Nursing Facility | Payer: Medicare Other | Attending: Internal Medicine | Admitting: Internal Medicine

## 2020-04-02 ENCOUNTER — Other Ambulatory Visit: Payer: Self-pay

## 2020-04-02 ENCOUNTER — Emergency Department (HOSPITAL_COMMUNITY): Payer: Medicare Other

## 2020-04-02 DIAGNOSIS — E8770 Fluid overload, unspecified: Secondary | ICD-10-CM | POA: Diagnosis present

## 2020-04-02 DIAGNOSIS — Z7952 Long term (current) use of systemic steroids: Secondary | ICD-10-CM | POA: Diagnosis not present

## 2020-04-02 DIAGNOSIS — F1010 Alcohol abuse, uncomplicated: Secondary | ICD-10-CM | POA: Diagnosis not present

## 2020-04-02 DIAGNOSIS — E785 Hyperlipidemia, unspecified: Secondary | ICD-10-CM | POA: Diagnosis present

## 2020-04-02 DIAGNOSIS — R7989 Other specified abnormal findings of blood chemistry: Secondary | ICD-10-CM

## 2020-04-02 DIAGNOSIS — E875 Hyperkalemia: Secondary | ICD-10-CM

## 2020-04-02 DIAGNOSIS — E8809 Other disorders of plasma-protein metabolism, not elsewhere classified: Secondary | ICD-10-CM | POA: Diagnosis present

## 2020-04-02 DIAGNOSIS — R443 Hallucinations, unspecified: Secondary | ICD-10-CM

## 2020-04-02 DIAGNOSIS — Z8619 Personal history of other infectious and parasitic diseases: Secondary | ICD-10-CM

## 2020-04-02 DIAGNOSIS — I129 Hypertensive chronic kidney disease with stage 1 through stage 4 chronic kidney disease, or unspecified chronic kidney disease: Secondary | ICD-10-CM | POA: Diagnosis present

## 2020-04-02 DIAGNOSIS — B182 Chronic viral hepatitis C: Secondary | ICD-10-CM | POA: Diagnosis present

## 2020-04-02 DIAGNOSIS — M109 Gout, unspecified: Secondary | ICD-10-CM | POA: Diagnosis present

## 2020-04-02 DIAGNOSIS — G8929 Other chronic pain: Secondary | ICD-10-CM | POA: Diagnosis present

## 2020-04-02 DIAGNOSIS — R52 Pain, unspecified: Secondary | ICD-10-CM

## 2020-04-02 DIAGNOSIS — K7030 Alcoholic cirrhosis of liver without ascites: Secondary | ICD-10-CM

## 2020-04-02 DIAGNOSIS — Z8673 Personal history of transient ischemic attack (TIA), and cerebral infarction without residual deficits: Secondary | ICD-10-CM | POA: Diagnosis not present

## 2020-04-02 DIAGNOSIS — E871 Hypo-osmolality and hyponatremia: Secondary | ICD-10-CM | POA: Diagnosis present

## 2020-04-02 DIAGNOSIS — M25552 Pain in left hip: Secondary | ICD-10-CM | POA: Diagnosis present

## 2020-04-02 DIAGNOSIS — Z20822 Contact with and (suspected) exposure to covid-19: Secondary | ICD-10-CM | POA: Diagnosis present

## 2020-04-02 DIAGNOSIS — R188 Other ascites: Secondary | ICD-10-CM

## 2020-04-02 DIAGNOSIS — K7460 Unspecified cirrhosis of liver: Secondary | ICD-10-CM | POA: Diagnosis present

## 2020-04-02 DIAGNOSIS — N182 Chronic kidney disease, stage 2 (mild): Secondary | ICD-10-CM | POA: Diagnosis present

## 2020-04-02 DIAGNOSIS — K7031 Alcoholic cirrhosis of liver with ascites: Secondary | ICD-10-CM | POA: Diagnosis present

## 2020-04-02 DIAGNOSIS — K7290 Hepatic failure, unspecified without coma: Secondary | ICD-10-CM | POA: Diagnosis present

## 2020-04-02 DIAGNOSIS — I10 Essential (primary) hypertension: Secondary | ICD-10-CM | POA: Diagnosis present

## 2020-04-02 DIAGNOSIS — F17200 Nicotine dependence, unspecified, uncomplicated: Secondary | ICD-10-CM | POA: Diagnosis present

## 2020-04-02 DIAGNOSIS — D638 Anemia in other chronic diseases classified elsewhere: Secondary | ICD-10-CM

## 2020-04-02 DIAGNOSIS — R609 Edema, unspecified: Secondary | ICD-10-CM | POA: Diagnosis not present

## 2020-04-02 DIAGNOSIS — N179 Acute kidney failure, unspecified: Secondary | ICD-10-CM | POA: Diagnosis not present

## 2020-04-02 DIAGNOSIS — R531 Weakness: Secondary | ICD-10-CM

## 2020-04-02 DIAGNOSIS — G92 Toxic encephalopathy: Secondary | ICD-10-CM | POA: Diagnosis present

## 2020-04-02 DIAGNOSIS — F10239 Alcohol dependence with withdrawal, unspecified: Secondary | ICD-10-CM | POA: Diagnosis present

## 2020-04-02 LAB — COMPREHENSIVE METABOLIC PANEL
ALT: 112 U/L — ABNORMAL HIGH (ref 0–44)
AST: 716 U/L — ABNORMAL HIGH (ref 15–41)
Albumin: 2.8 g/dL — ABNORMAL LOW (ref 3.5–5.0)
Alkaline Phosphatase: 103 U/L (ref 38–126)
Anion gap: 15 (ref 5–15)
BUN: 20 mg/dL (ref 6–20)
CO2: 15 mmol/L — ABNORMAL LOW (ref 22–32)
Calcium: 10.3 mg/dL (ref 8.9–10.3)
Chloride: 102 mmol/L (ref 98–111)
Creatinine, Ser: 2.88 mg/dL — ABNORMAL HIGH (ref 0.61–1.24)
GFR calc Af Amer: 26 mL/min — ABNORMAL LOW (ref >60)
GFR calc non Af Amer: 23 mL/min — ABNORMAL LOW (ref >60)
Glucose, Bld: 83 mg/dL (ref 70–99)
Potassium: 6 mmol/L — ABNORMAL HIGH (ref 3.5–5.1)
Sodium: 132 mmol/L — ABNORMAL LOW (ref 135–145)
Total Bilirubin: 1.9 mg/dL — ABNORMAL HIGH (ref 0.3–1.2)
Total Protein: 7.4 g/dL (ref 6.5–8.1)

## 2020-04-02 LAB — CBC
HCT: 29.4 % — ABNORMAL LOW (ref 39.0–52.0)
Hemoglobin: 10 g/dL — ABNORMAL LOW (ref 13.0–17.0)
MCH: 33.6 pg (ref 26.0–34.0)
MCHC: 34 g/dL (ref 30.0–36.0)
MCV: 98.7 fL (ref 80.0–100.0)
Platelets: 201 10*3/uL (ref 150–400)
RBC: 2.98 MIL/uL — ABNORMAL LOW (ref 4.22–5.81)
RDW: 13.8 % (ref 11.5–15.5)
WBC: 12.6 10*3/uL — ABNORMAL HIGH (ref 4.0–10.5)
nRBC: 0.2 % (ref 0.0–0.2)

## 2020-04-02 LAB — AMMONIA: Ammonia: 102 umol/L — ABNORMAL HIGH (ref 9–35)

## 2020-04-02 LAB — CBG MONITORING, ED: Glucose-Capillary: 85 mg/dL (ref 70–99)

## 2020-04-02 LAB — ETHANOL: Alcohol, Ethyl (B): <10 mg/dL (ref <10)

## 2020-04-02 MED ORDER — SODIUM BICARBONATE 8.4 % IV SOLN
50.0000 meq | Freq: Once | INTRAVENOUS | Status: AC
  Administered 2020-04-02: 50 meq via INTRAVENOUS
  Filled 2020-04-02: qty 50

## 2020-04-02 MED ORDER — FUROSEMIDE 10 MG/ML IJ SOLN
20.0000 mg | Freq: Once | INTRAMUSCULAR | Status: AC
  Administered 2020-04-03: 20 mg via INTRAVENOUS
  Filled 2020-04-02: qty 2

## 2020-04-02 MED ORDER — DEXTROSE 50 % IV SOLN
25.0000 g | Freq: Once | INTRAVENOUS | Status: AC
  Administered 2020-04-02: 25 g via INTRAVENOUS
  Filled 2020-04-02: qty 50

## 2020-04-02 MED ORDER — CALCIUM GLUCONATE 10 % IV SOLN
1.0000 g | Freq: Once | INTRAVENOUS | Status: AC
  Administered 2020-04-02: 1 g via INTRAVENOUS
  Filled 2020-04-02: qty 10

## 2020-04-02 MED ORDER — INSULIN ASPART 100 UNIT/ML ~~LOC~~ SOLN
6.0000 [IU] | Freq: Once | SUBCUTANEOUS | Status: AC
  Administered 2020-04-02: 6 [IU] via INTRAVENOUS

## 2020-04-02 MED ORDER — SODIUM ZIRCONIUM CYCLOSILICATE 10 G PO PACK
10.0000 g | PACK | Freq: Once | ORAL | Status: AC
  Administered 2020-04-02: 10 g via ORAL
  Filled 2020-04-02: qty 1

## 2020-04-02 NOTE — H&P (Signed)
[facility] History and Physical  Calvin [name] IPJ:[id number] DOB: May 28, 1961 DOA: 04/02/2020  Referring EDP: Ashok Cordia PCP: Jenny Reichmann, PA-C   Chief Complaint: Hallucinations  HPI: Calvin [name] is a 59 y.o. male with PMH of alcoholic cirrhosis, Hep C s/p treatment, alcohol abuse, HLD, HTN and gout who presented to ED with hallucinations and admitted for AKI, hyperkalemia and acute on chronic liver injury.  History provided by patient and his wife. Wife reports she started noticing confusion and hallucinations yesterday which have persisted today. Reports he was seeing other people in the room and talking to them and at times has been unsure about where he is; denies this happening previously. Patient reports he did not take Norvasc for a few days but has otherwise been taking other medications, he thinks as prescribed. Reports also taking Oxycodone for chronic hip and knee pain. Continues to drink at least two beers daily and last drank this afternoon around 1400. Wife reports that he was having trouble walking yesterday and with the confusion, she thought he was having a stroke. Patient reports his knees were bothering him and that is why he couldn't walk. Wife also reports that he seemed to have abdominal pain yesterday but patient denies now or currently. Denies headache, dizziness, fever, chills, cough, SOB, chest pain, abdominal pain, nausea, vomiting, diarrhea, constipation, dysuria, hematuria, hematochezia, melena, speech difficulty, trouble eating or any other complaints.  In the ED: Mild tachycardia and borderline soft BP's otherwise stable on room air. Labs remarkable for Na 132, K 6.0, CO2 15, Cr 2.88, AST 716, ALT 112, TSB 1.9, WBC 12.6, Hgb 10.0, Alcohol level < 10. Ammonia and INR pending.  CT Head: non-acute  Patient was given Calcium gluconate, Insulin and D50, Lokelma. Admission requested for AKI and hyperkalemia.  Review of Systems:  All other systems  negative unless noted above in HPI.   Past Medical History:  Diagnosis Date  . Ascites   . Chronic left hip pain   . Cirrhosis ([place])   . Hypertension   . TIA (transient ischemic attack) 06/23/2016   Past Surgical History:  Procedure Laterality Date  . BACK SURGERY    . CERVICAL SPINE SURGERY    . JOINT REPLACEMENT     Social History:  reports that he has been smoking. He has never used smokeless tobacco. He reports current alcohol use. He reports that he does not use drugs.  No Known Allergies  History reviewed. No pertinent family history.   Prior to Admission medications   Medication Sig Start Date End Date Taking? Authorizing Provider  Ensure Plus (ENSURE PLUS) LIQD Take 237 mLs by mouth daily.    [provider]  furosemide (LASIX) 40 MG tablet Take 1 tablet (40 mg total) by mouth daily. 07/18/19   Matilde Haymaker, MD  lactulose (CHRONULAC) 10 GM/15ML solution Take 15 mLs (10 g total) by mouth 2 (two) times daily. 07/17/19   Matilde Haymaker, MD  omeprazole (PRILOSEC) 20 MG capsule Take 20 mg by mouth daily.    [provider]  oxyCODONE-acetaminophen (PERCOCET) 10-325 MG tablet Take 1 tablet by mouth every 4 (four) hours as needed for pain.    [provider]  prednisoLONE 5 MG TABS tablet Take 8 tablets (40 mg total) by mouth daily. 07/17/19   Matilde Haymaker, MD  predniSONE (DELTASONE) 20 MG tablet Take 2 tablets (40 mg total) by mouth daily with breakfast. 07/18/19   Matilde Haymaker, MD  spironolactone (ALDACTONE) 100 MG tablet Take  1 tablet (100 mg total) by mouth daily. 07/18/19   Matilde Haymaker, MD   Physical Exam: Vitals:   04/02/20 2213 04/02/20 2217 04/02/20 2300 04/02/20 2314  BP:  106/72 100/74   Pulse: (!) 103 (!) 103 (!) 113 (!) 107  Resp: (!) 28 (!) 26 (!) 24 (!) 21  Temp:      TempSrc:      SpO2: 98% 98% 100% 98%    Wt Readings from Last 3 Encounters:  09/24/19 74.8 kg  07/17/19 78.9 kg  02/15/18 79.4 kg    . General:  Appears calm and  comfortable. AAOx4.  . Eyes: EOMI, normal lids, mild scleral icterus . ENT: grossly normal hearing, lips & tongue . Neck: normal ROM . Cardiovascular: RRR, no m/r/g. Bilateral 2+ LE pitting edema.  Marland Kitchen Respiratory: CTA bilaterally, no w/r/r. Normal respiratory effort. . Abdomen: soft, non-tender, ascites noted with minimal distention  . Skin: no rash or induration seen on limited exam . Musculoskeletal: grossly normal tone BUE/BLE . Psychiatric: grossly normal mood and affect, speech mumbled but appropriate; no slurring . Neurologic: grossly non-focal.          Labs on Admission:  Basic Metabolic Panel: Recent Labs  Lab 04/02/20 1353  NA 132*  K 6.0*  CL 102  CO2 15*  GLUCOSE 83  BUN 20  CREATININE 2.88*  CALCIUM 10.3   Liver Function Tests: Recent Labs  Lab 04/02/20 1353  AST 716*  ALT 112*  ALKPHOS 103  BILITOT 1.9*  PROT 7.4  ALBUMIN 2.8*   No results for input(s): LIPASE, AMYLASE in the last 168 hours. Recent Labs  Lab 04/02/20 2238  AMMONIA 102*   CBC: Recent Labs  Lab 04/02/20 1353  WBC 12.6*  HGB 10.0*  HCT 29.4*  MCV 98.7  PLT 201   Cardiac Enzymes: No results for input(s): CKTOTAL, CKMB, CKMBINDEX, TROPONINI in the last 168 hours.  BNP (last 3 results) Recent Labs    07/15/19 1122  BNP 53.1    ProBNP (last 3 results) No results for input(s): PROBNP in the last 8760 hours.  CBG: Recent Labs  Lab 04/02/20 1346  GLUCAP 85    Radiological Exams on Admission: CT HEAD WO CONTRAST  Result Date: 04/02/2020 CLINICAL DATA:  Altered mental status, slurred speech, hallucinations EXAM: CT HEAD WITHOUT CONTRAST TECHNIQUE: Contiguous axial images were obtained from the base of the skull through the vertex without intravenous contrast. COMPARISON:  08/21/2018 FINDINGS: Brain: Normal anatomic configuration. Mild parenchymal volume loss is slightly advanced in relation to expectation for age, but appears stable since prior examination. Mild  periventricular white matter changes are again noted, likely reflecting the sequela of small vessel ischemia. No abnormal intra or extra-axial mass lesion or fluid collection. No abnormal mass effect or midline shift. No evidence of acute intracranial hemorrhage or infarct. Ventricular size is normal. Cerebellum unremarkable. Vascular: Unremarkable Skull: Intact Sinuses/Orbits: Chronic fracture of the medial wall of the right orbit. 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 These are both unchanged from prior examination. There is mucosal thickening involving the poorly aerated right frontal sinus, several ethmoid air cells, and the sphenoid sinuses along with small air-fluid levels within the sphenoid sinuses in keeping with mild paranasal sinus disease. This appears new since prior examination. Other: Mastoid air cells and middle ear cavities are clear. IMPRESSION: No evidence of acute intracranial hemorrhage or infarct. Stable senescent changes. Mild paranasal sinus disease, new  since prior examination. Electronically Signed   By: Fidela Salisbury MD   On: 04/02/2020 22:11    EKG: Independently reviewed. HR 105. Sinus rhythm. QTc 429. No STEMI. No significant change when compared to Nov 2019.  Assessment/Plan Principal Problem:   AKI (acute kidney injury) ([place]) Active Problems:   Essential hypertension   Alcohol abuse   Chronic left hip pain   Hyponatremia   Hyperlipidemia   Decompensated hepatic cirrhosis (HCC)   Ascites due to alcoholic cirrhosis (HCC)   Chronic hepatitis C without hepatic coma (HCC)   Hyperkalemia   Hallucinations   Anemia of chronic disease  59 y.o. male with PMH of alcoholic cirrhosis, Hep C s/p treatment, alcohol abuse, HLD, HTN and gout who presented to ED with hallucinations and admitted for AKI, hyperkalemia and acute on chronic liver injury.  AKI - difficult to assess baseline; Cr ~ 1.3  in Oct 2020 - Cr 2.88 on admission - appears volume overloaded on exam; will give 20 mg IV Lasix and see if this improves kidney function - urine studies ordered  - Hold Spironolactone for AKI and hyperK  Hallucinations AMS - DDx: Hyperammonemia, polypharmacy, electrolyte derangements, alcohol abuse, drug abuse, SBP, other infection -  Pending workup as below - low threshold for SBP ppx; paracentesis in AM - currently AAOx4  Ascites Decompensated cirrhosis Chronic Hep C; s/p treatment - presents with mild ascites on exam in setting of cirrhosis - pending ammonia level; likely elevated and contributing to hallucinations - INR pending; calculate daily MELD - IR consulted for diagnostic and therapeutic paracentesis in AM - Mildly elevated WBC however abdomen non-tender and patient afebrile, will hold on SBP treatment with 2 g Ceftriaxone; low threshold for initiation; confusion more likely related to possibly elevated ammonia level, polypharmacy, electrolyte abnormalities - reports he completed treatment for Hep C around May 2021; needs repeat Hep C testing 12 weeks after; f/u with GI - elevated LFTs on admission; trend - f/u Cytology, cultures from diagnostic paracentesis - Hold Spironolactone as above - Tylenol level and UDS pending   Creatinine elevation Creatinine elevated to 1.3 from 1.1 on 10/24.  Likely secondary to new diuretics Lasix and spironolactone.  We will continue to monitor for now and encourage adequate p.o. intake. -Continue to monitor  Alcohol Abuse - only reports drinking about 2 beers daily; last at 1400 on 7/12 but per notes he drinks about 3 pints of wine per day - CIWA protocol ordered  - Seizure precautions - Ativan as needed. -Thiamine and Folic acid   Anemia, normocytic - at baseline; c/w anemia of chronic disease from previous studies - cont to monitor   Hypertension - will hold home Norvasc due to borderline soft pressures despite not taking  Norvasc for a few days; monitor and consider holding on discharge  Chronic pain - Home medications: percocet 10-325 q4h PRN and Gabapentin - hold in setting of hallucinations  Code Status: Full DVT Prophylaxis: None; pre-procedure Family Communication: Wife at bedside Disposition Plan: Admit to inpatient. Patient with acute lab abnormalities requiring further monitoring and workup. IR consulted for procedure. Hallucinations and altered mental status require further monitoring as well. Patient is at high risk for further decompensation due to age and co-morbidities. Anticipate discharge home in 2-3 days.    Time spent: 70 minutes  Chauncey Mann, MD [facility] Pager [phone]

## 2020-04-02 NOTE — ED Notes (Signed)
Pt eloped briefly, wife found him and brought him back. There was a minor delay in care while trying to find pt

## 2020-04-02 NOTE — ED Triage Notes (Signed)
Pt reports he woke this am feeling like he was in a different place and seeing people that were not there. Pt states he could see them but his family could not. Spouse brought him in to rule out a stroke. Pt states he last drank 0200 this am, reports he drank 2 33 liters, states he smokes mariajuana daily unsure if he smoked any last night, pt states he takes OxyContin every day all day. Pt with slurred speech in triage, no neuro deficits noted.

## 2020-04-02 NOTE — ED Provider Notes (Signed)
[facility] EMERGENCY DEPARTMENT Provider Note   CSN: [id number] Arrival date & time: 04/02/20  1336     History Chief Complaint  Patient presents with  . Hallucinations    Calvin [name] is a 59 y.o. male.  Patient with hx cirrhosis, presents with general weakness, and indicates this AM when awoke, he felt like he was in a different place, disoriented. Spouse noted he was seeing people not there, and pt indicates spouse wants him checked for stroke. Last drank etoh around 2 AM today. Denies tremor or shakes. Denies other substance abuse. States compliant w normal home meds. Symptoms acute onset, moderate, persistent, but now improving - currently denies hallucinations, and pt is alert/oriented. Pt poor historian, level 5 caveat.   The history is provided by the patient. The history is limited by the condition of the patient.       Past Medical History:  Diagnosis Date  . Ascites   . Chronic left hip pain   . Cirrhosis ([place])   . Hypertension     Patient Active Problem List   Diagnosis Date Noted  . Chronic hepatitis C without hepatic coma ([place]) 08/09/2019  . Ascites due to alcoholic cirrhosis ([place])   . SOB (shortness of breath)   . Decompensated hepatic cirrhosis ([place]) 07/15/2019  . TIA (transient ischemic attack) 06/23/2016  . Essential hypertension 06/23/2016  . Alcohol abuse 06/23/2016  . Chronic left hip pain 06/23/2016  . Hyponatremia 06/23/2016  . Hyperlipidemia   . Blind right eye     Past Surgical History:  Procedure Laterality Date  . BACK SURGERY    . CERVICAL SPINE SURGERY    . JOINT REPLACEMENT         History reviewed. No pertinent family history.  Social History   Tobacco Use  . Smoking status: Current Every Day Smoker  . Smokeless tobacco: Never Used  Substance Use Topics  . Alcohol use: Yes  . Drug use: No    Home Medications Prior to Admission medications   Medication Sig Start Date End Date Taking? Authorizing  Provider  Ensure Plus (ENSURE PLUS) LIQD Take 237 mLs by mouth daily.    [provider]  furosemide (LASIX) 40 MG tablet Take 1 tablet (40 mg total) by mouth daily. 07/18/19   Matilde Haymaker, MD  lactulose (CHRONULAC) 10 GM/15ML solution Take 15 mLs (10 g total) by mouth 2 (two) times daily. 07/17/19   Matilde Haymaker, MD  omeprazole (PRILOSEC) 20 MG capsule Take 20 mg by mouth daily.    [provider]  oxyCODONE-acetaminophen (PERCOCET) 10-325 MG tablet Take 1 tablet by mouth every 4 (four) hours as needed for pain.    [provider]  prednisoLONE 5 MG TABS tablet Take 8 tablets (40 mg total) by mouth daily. 07/17/19   Matilde Haymaker, MD  predniSONE (DELTASONE) 20 MG tablet Take 2 tablets (40 mg total) by mouth daily with breakfast. 07/18/19   Matilde Haymaker, MD  spironolactone (ALDACTONE) 100 MG tablet Take 1 tablet (100 mg total) by mouth daily. 07/18/19   Matilde Haymaker, MD    Allergies    Patient has no known allergies.  Review of Systems   Review of Systems  Constitutional: Negative for chills, fever and unexpected weight change.  HENT: Negative for sore throat.   Eyes: Negative for visual disturbance.  Respiratory: Negative for cough and shortness of breath.   Cardiovascular: Negative for chest pain.  Gastrointestinal: Negative for abdominal pain, diarrhea and vomiting.  Endocrine: Negative for polyuria.  Genitourinary: Negative for dysuria and flank pain.  Musculoskeletal: Negative for back pain and neck pain.  Skin: Negative for rash.  Neurological: Negative for speech difficulty and headaches.  Hematological: Does not bruise/bleed easily.  Psychiatric/Behavioral: Positive for hallucinations.    Physical Exam Updated Vital Signs BP 110/71 (BP Location: Right Arm)   Pulse 100   Temp 98.1 F (36.7 C) (Oral)   Resp 18   SpO2 100%   Physical Exam Vitals and nursing note reviewed.  Constitutional:      General: He is not in acute distress.     Appearance: Normal appearance. He is well-developed.  HENT:     Head: Atraumatic.     Nose: Nose normal.     Mouth/Throat:     Mouth: Mucous membranes are moist.     Pharynx: Oropharynx is clear.  Eyes:     General: No scleral icterus.    Conjunctiva/sclera: Conjunctivae normal.     Pupils: Pupils are equal, round, and reactive to light.  Neck:     Vascular: No carotid bruit.     Trachea: No tracheal deviation.  Cardiovascular:     Rate and Rhythm: Normal rate and regular rhythm.     Pulses: Normal pulses.     Heart sounds: Normal heart sounds. No murmur heard.  No friction rub. No gallop.   Pulmonary:     Effort: Pulmonary effort is normal. No accessory muscle usage or respiratory distress.     Breath sounds: Normal breath sounds.  Abdominal:     General: Bowel sounds are normal. There is no distension.     Palpations: Abdomen is soft.     Tenderness: There is no abdominal tenderness. There is no guarding.  Genitourinary:    Comments: No cva tenderness. Musculoskeletal:        General: Normal range of motion.     Cervical back: Normal range of motion and neck supple. No rigidity.     Right lower leg: Edema present.     Left lower leg: Edema present.  Skin:    General: Skin is warm and dry.     Findings: No rash.  Neurological:     Mental Status: He is alert and oriented to person, place, and time.     Comments: Alert, speech clear. Motor intact bil, stre 5/5. Sensation grossly intact bil. Steady gait.   Psychiatric:        Mood and Affect: Mood normal.      ED Results / Procedures / Treatments   Labs (all labs ordered are listed, but only abnormal results are displayed) Results for orders placed or performed during the hospital encounter of 04/02/20  Comprehensive metabolic panel  Result Value Ref Range   Sodium 132 (L) 135 - 145 mmol/L   Potassium 6.0 (H) 3.5 - 5.1 mmol/L   Chloride 102 98 - 111 mmol/L   CO2 15 (L) 22 - 32 mmol/L   Glucose, Bld 83 70 - 99  mg/dL   BUN 20 6 - 20 mg/dL   Creatinine, Ser 2.88 (H) 0.61 - 1.24 mg/dL   Calcium 10.3 8.9 - 10.3 mg/dL   Total Protein 7.4 6.5 - 8.1 g/dL   Albumin 2.8 (L) 3.5 - 5.0 g/dL   AST 716 (H) 15 - 41 U/L   ALT 112 (H) 0 - 44 U/L   Alkaline Phosphatase 103 38 - 126 U/L   Total Bilirubin 1.9 (H) 0.3 - 1.2 mg/dL   GFR  calc non Af Amer 23 (L) >60 mL/min   GFR calc Af Amer 26 (L) >60 mL/min   Anion gap 15 5 - 15  Ethanol  Result Value Ref Range   Alcohol, Ethyl (B) <10 <10 mg/dL  cbc  Result Value Ref Range   WBC 12.6 (H) 4.0 - 10.5 K/uL   RBC 2.98 (L) 4.22 - 5.81 MIL/uL   Hemoglobin 10.0 (L) 13.0 - 17.0 g/dL   HCT 29.4 (L) 39 - 52 %   MCV 98.7 80.0 - 100.0 fL   MCH 33.6 26.0 - 34.0 pg   MCHC 34.0 30.0 - 36.0 g/dL   RDW 13.8 11.5 - 15.5 %   Platelets 201 150 - 400 K/uL   nRBC 0.2 0.0 - 0.2 %  CBG monitoring, ED  Result Value Ref Range   Glucose-Capillary 85 70 - 99 mg/dL   Comment 1 Notify RN    Comment 2 Document in Chart     EKG EKG Interpretation  Date/Time:  Monday April 02 2020 22:04:24 EDT Ventricular Rate:  106 PR Interval:    QRS Duration: 80 QT Interval:  323 QTC Calculation: 429 R Axis:   39 Text Interpretation: Sinus tachycardia Atrial premature complexes Nonspecific T wave abnormality Confirmed by Lajean Saver [phone]) on 04/02/2020 10:19:32 PM   Radiology CT HEAD WO CONTRAST  Result Date: 04/02/2020 CLINICAL DATA:  Altered mental status, slurred speech, hallucinations EXAM: CT HEAD WITHOUT CONTRAST TECHNIQUE: Contiguous axial images were obtained from the base of the skull through the vertex without intravenous contrast. COMPARISON:  08/21/2018 FINDINGS: Brain: Normal anatomic configuration. Mild parenchymal volume loss is slightly advanced in relation to expectation for age, but appears stable since prior examination. Mild periventricular white matter changes are again noted, likely reflecting the sequela of small vessel ischemia. No abnormal intra or extra-axial  mass lesion or fluid collection. No abnormal mass effect or midline shift. No evidence of acute intracranial hemorrhage or infarct. Ventricular size is normal. Cerebellum unremarkable. Vascular: Unremarkable Skull: Intact Sinuses/Orbits: Chronic fracture of the medial wall of the right orbit. 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 These are both unchanged from prior examination. There is mucosal thickening involving the poorly aerated right frontal sinus, several ethmoid air cells, and the sphenoid sinuses along with small air-fluid levels within the sphenoid sinuses in keeping with mild paranasal sinus disease. This appears new since prior examination. Other: Mastoid air cells and middle ear cavities are clear. IMPRESSION: No evidence of acute intracranial hemorrhage or infarct. Stable senescent changes. Mild paranasal sinus disease, new since prior examination. Electronically Signed   By: Fidela Salisbury MD   On: 04/02/2020 22:11    Procedures Procedures (including critical care time)  Medications Ordered in ED Medications  calcium gluconate inj 10% (1 g) URGENT USE ONLY! (has no administration in time range)  sodium bicarbonate injection 50 mEq (has no administration in time range)  dextrose 50 % solution 25 g (has no administration in time range)  insulin aspart (novoLOG) injection 6 Units (has no administration in time range)    ED Course  I have reviewed the triage vital signs and the nursing notes.  Pertinent labs & imaging results that were available during my care of the patient were reviewed by me and considered in my medical decision making (see chart for details).    MDM Rules/Calculators/A&P  Iv ns. Continuous pulse ox and monitor. Stat labs. Imaging ordered.   Reviewed nursing notes and prior charts for additional history. Prior admission related to cirrhosis reviewed.    MDM Number of Diagnoses or Management Options   Amount and/or Complexity of Data Reviewed Clinical lab tests: ordered and reviewed Tests in the radiology section of CPT: ordered and reviewed Tests in the medicine section of CPT: ordered and reviewed Discussion of test results with the performing providers: yes Decide to obtain previous medical records or to obtain history from someone other than the patient: yes Obtain history from someone other than the patient: yes Review and summarize past medical records: yes Discuss the patient with other providers: yes Independent visualization of images, tracings, or specimens: yes  Risk of Complications, Morbidity, and/or Mortality Presenting problems: high Diagnostic procedures: high Management options: high  initial labs reviewed/interpreted by me - k high. AKI on labs with cr 2.88.   Ecg, continuous monitoring. Calcium iv. hco3 iv. d50 iv. novolog iv. lokelma po.  CT reviewed/interpreted by me - no hem.  Medical service consulted for admission.   Additional labs reviewed/interpreted by me - lfts increased.   CRITICAL CARE RE: AKI with hyperkalemia, cirrhosis, elevated lfts.  Performed by: Mirna Mires Total critical care time: 40 minutes Critical care time was exclusive of separately billable procedures and treating other patients. Critical care was necessary to treat or prevent imminent or life-threatening deterioration. Critical care was time spent personally by me on the following activities: development of treatment plan with patient and/or surrogate as well as nursing, discussions with consultants, evaluation of patient's response to treatment, examination of patient, obtaining history from patient or surrogate, ordering and performing treatments and interventions, ordering and review of laboratory studies, ordering and review of radiographic studies, pulse oximetry and re-evaluation of patient's condition.    Final  Clinical Impression(s) / ED Diagnoses Final diagnoses:  None    Rx / DC Orders ED Discharge Orders    None       Lajean Saver, MD 04/02/20 2220

## 2020-04-03 ENCOUNTER — Inpatient Hospital Stay (HOSPITAL_COMMUNITY): Payer: Medicare Other

## 2020-04-03 DIAGNOSIS — N179 Acute kidney failure, unspecified: Secondary | ICD-10-CM | POA: Diagnosis not present

## 2020-04-03 LAB — RAPID URINE DRUG SCREEN, HOSP PERFORMED
Amphetamines: NOT DETECTED
Barbiturates: NOT DETECTED
Benzodiazepines: NOT DETECTED
Cocaine: NOT DETECTED
Opiates: POSITIVE — AB
Tetrahydrocannabinol: NOT DETECTED

## 2020-04-03 LAB — CBC
HCT: 28.9 % — ABNORMAL LOW (ref 39.0–52.0)
Hemoglobin: 10.2 g/dL — ABNORMAL LOW (ref 13.0–17.0)
MCH: 34.5 pg — ABNORMAL HIGH (ref 26.0–34.0)
MCHC: 35.3 g/dL (ref 30.0–36.0)
MCV: 97.6 fL (ref 80.0–100.0)
Platelets: 193 10*3/uL (ref 150–400)
RBC: 2.96 MIL/uL — ABNORMAL LOW (ref 4.22–5.81)
RDW: 13.9 % (ref 11.5–15.5)
WBC: 9.8 10*3/uL (ref 4.0–10.5)
nRBC: 0 % (ref 0.0–0.2)

## 2020-04-03 LAB — COMPREHENSIVE METABOLIC PANEL
ALT: 101 U/L — ABNORMAL HIGH (ref 0–44)
AST: 564 U/L — ABNORMAL HIGH (ref 15–41)
Albumin: 2.4 g/dL — ABNORMAL LOW (ref 3.5–5.0)
Alkaline Phosphatase: 95 U/L (ref 38–126)
Anion gap: 12 (ref 5–15)
BUN: 26 mg/dL — ABNORMAL HIGH (ref 6–20)
CO2: 19 mmol/L — ABNORMAL LOW (ref 22–32)
Calcium: 9.8 mg/dL (ref 8.9–10.3)
Chloride: 105 mmol/L (ref 98–111)
Creatinine, Ser: 1.75 mg/dL — ABNORMAL HIGH (ref 0.61–1.24)
GFR calc Af Amer: 48 mL/min — ABNORMAL LOW (ref >60)
GFR calc non Af Amer: 41 mL/min — ABNORMAL LOW (ref >60)
Glucose, Bld: 102 mg/dL — ABNORMAL HIGH (ref 70–99)
Potassium: 4.2 mmol/L (ref 3.5–5.1)
Sodium: 136 mmol/L (ref 135–145)
Total Bilirubin: 1.7 mg/dL — ABNORMAL HIGH (ref 0.3–1.2)
Total Protein: 6.7 g/dL (ref 6.5–8.1)

## 2020-04-03 LAB — ACETAMINOPHEN LEVEL: Acetaminophen (Tylenol), Serum: <10 ug/mL — ABNORMAL LOW (ref 10–30)

## 2020-04-03 LAB — MAGNESIUM: Magnesium: 2 mg/dL (ref 1.7–2.4)

## 2020-04-03 LAB — PROTIME-INR
INR: 1.9 — ABNORMAL HIGH (ref 0.8–1.2)
Prothrombin Time: 20.8 seconds — ABNORMAL HIGH (ref 11.4–15.2)

## 2020-04-03 LAB — PHOSPHORUS: Phosphorus: 3.5 mg/dL (ref 2.5–4.6)

## 2020-04-03 LAB — SARS CORONAVIRUS 2 BY RT PCR (HOSPITAL ORDER, PERFORMED IN ~~LOC~~ HOSPITAL LAB): SARS Coronavirus 2: NEGATIVE

## 2020-04-03 LAB — SODIUM, URINE, RANDOM: Sodium, Ur: 42 mmol/L

## 2020-04-03 LAB — AMMONIA: Ammonia: 62 umol/L — ABNORMAL HIGH (ref 9–35)

## 2020-04-03 MED ORDER — SPIRONOLACTONE 12.5 MG HALF TABLET
12.5000 mg | ORAL_TABLET | Freq: Every day | ORAL | Status: DC
  Administered 2020-04-03 – 2020-04-05 (×3): 12.5 mg via ORAL
  Filled 2020-04-03 (×4): qty 1

## 2020-04-03 MED ORDER — THIAMINE HCL 100 MG PO TABS
100.0000 mg | ORAL_TABLET | Freq: Every day | ORAL | Status: DC
  Administered 2020-04-03 – 2020-04-07 (×4): 100 mg via ORAL
  Filled 2020-04-03 (×5): qty 1

## 2020-04-03 MED ORDER — SPIRONOLACTONE 100 MG PO TABS: 100.0000 mg | ORAL_TABLET | Freq: Every day | ORAL | Status: DC

## 2020-04-03 MED ORDER — ADULT MULTIVITAMIN W/MINERALS CH
1.0000 | ORAL_TABLET | Freq: Every day | ORAL | Status: DC
  Administered 2020-04-03 – 2020-04-07 (×5): 1 via ORAL
  Filled 2020-04-03 (×5): qty 1

## 2020-04-03 MED ORDER — GABAPENTIN 100 MG PO CAPS
100.0000 mg | ORAL_CAPSULE | Freq: Two times a day (BID) | ORAL | Status: DC
  Administered 2020-04-03 – 2020-04-07 (×9): 100 mg via ORAL
  Filled 2020-04-03 (×9): qty 1

## 2020-04-03 MED ORDER — LORAZEPAM 1 MG PO TABS
1.0000 mg | ORAL_TABLET | ORAL | Status: AC | PRN
  Administered 2020-04-04: 2 mg via ORAL
  Administered 2020-04-04: 1 mg via ORAL
  Filled 2020-04-03: qty 2
  Filled 2020-04-03: qty 1

## 2020-04-03 MED ORDER — TIZANIDINE HCL 4 MG PO TABS
4.0000 mg | ORAL_TABLET | Freq: Three times a day (TID) | ORAL | Status: DC | PRN
  Administered 2020-04-05 – 2020-04-06 (×2): 4 mg via ORAL
  Filled 2020-04-03 (×4): qty 1

## 2020-04-03 MED ORDER — LACTULOSE 10 GM/15ML PO SOLN
10.0000 g | Freq: Two times a day (BID) | ORAL | Status: DC
  Administered 2020-04-03 (×2): 10 g via ORAL
  Filled 2020-04-03 (×3): qty 15

## 2020-04-03 MED ORDER — NICOTINE 21 MG/24HR TD PT24
21.0000 mg | MEDICATED_PATCH | Freq: Every day | TRANSDERMAL | Status: DC
  Administered 2020-04-03 – 2020-04-07 (×5): 21 mg via TRANSDERMAL
  Filled 2020-04-03 (×5): qty 1

## 2020-04-03 MED ORDER — LORAZEPAM 2 MG/ML IJ SOLN: 1.0000 mg | INTRAMUSCULAR | Status: AC | PRN

## 2020-04-03 MED ORDER — FOLIC ACID 1 MG PO TABS
1.0000 mg | ORAL_TABLET | Freq: Every day | ORAL | Status: DC
  Administered 2020-04-03 – 2020-04-07 (×5): 1 mg via ORAL
  Filled 2020-04-03 (×5): qty 1

## 2020-04-03 MED ORDER — THIAMINE HCL 100 MG/ML IJ SOLN
100.0000 mg | Freq: Every day | INTRAMUSCULAR | Status: DC
  Administered 2020-04-06: 100 mg via INTRAVENOUS
  Filled 2020-04-03: qty 2

## 2020-04-03 NOTE — ED Notes (Signed)
Report called to [place], [place] 2W

## 2020-04-03 NOTE — Progress Notes (Addendum)
PROGRESS NOTE  Calvin [name] EZM:[id number] DOB: 02-04-1961 DOA: 04/02/2020 PCP: Jenny Reichmann, PA-C  HPI/Recap of past 24 hours: HPI: Calvin [name] is a 59 y.o. male with PMH of alcoholic cirrhosis, Hep C s/p treatment, alcohol abuse, HLD, HTN and gout who presented to ED with hallucinations and admitted for AKI, hyperkalemia and acute on chronic liver injury.  History provided by patient and his wife. Wife reports she started noticing confusion and hallucinations yesterday which have persisted today. Reports he was seeing other people in the room and talking to them and at times has been unsure about where he is; denies this happening previously. Patient reports he did not take Norvasc for a few days but has otherwise been taking other medications, he thinks as prescribed. Reports also taking Oxycodone for chronic hip and knee pain. Continues to drink at least two beers daily and last drank this afternoon around 1400. Wife reports that he was having trouble walking yesterday and with the confusion, she thought he was having a stroke. Patient reports his knees were bothering him and that is why he couldn't walk. Wife also reports that he seemed to have abdominal pain yesterday but patient denies now or currently. Denies headache, dizziness, fever, chills, cough, SOB, chest pain, abdominal pain, nausea, vomiting, diarrhea, constipation, dysuria, hematuria, hematochezia, melena, speech difficulty, trouble eating or any other complaints.  In the ED: Mild tachycardia and borderline soft BP's otherwise stable on room air. Labs remarkable for Na 132, K 6.0, CO2 15, Cr 2.88, AST 716, ALT 112, TSB 1.9, WBC 12.6, Hgb 10.0, Alcohol level < 10. Ammonia and INR pending.  CT Head: non-acute  Patient was given Calcium gluconate, Insulin and D50, Lokelma. Admission requested for AKI and hyperkalemia.  04/03/20: Patient was seen and examined at his bedside in the ED.  He reports hurting all over.  He  is alert and oriented x3.  Unable to obtain paracentesis, by interventional radiology, no fluid that could be safely accessed.   Assessment/Plan: Principal Problem:   AKI (acute kidney injury) ([place]) Active Problems:   Essential hypertension   Alcohol abuse   Chronic left hip pain   Hyponatremia   Hyperlipidemia   Decompensated hepatic cirrhosis (HCC)   Ascites due to alcoholic cirrhosis (HCC)   Chronic hepatitis C without hepatic coma (HCC)   Hyperkalemia   Hallucinations   Anemia of chronic disease  Resolved, acute toxic metabolic encephalopathy likely multifactorial secondary to hyperammonemia in the setting of cirrhosis, electrolyte derangement, polypharmacy. Presented with ammonia level greater than 100, altered mental status, polypharmacy, electrolyte derangement At the time of this visit he is alert oriented x3. 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 suspect prerenal in the setting of dehydration Baseline creatinine appears to be 1.3 with GFR greater than 60 Presented with creatinine of 2.8 with GFR of 26, downtrending, 1.75 with GFR 48 Monitor urine output Continue to avoid nephrotoxins and dehydration Caution with IV fluid to avoid volume overload in the setting of hypoalbuminemia Restart spironolactone  Hypoalbuminemia in the setting of chronic liver disease Counseled on the importance of alcohol cessation in the setting of chronic liver disease, not receptive Avoid hepatotoxic agents  Resolved hyperkalemia, post treatment Presented with potassium level of 6.0, treated Repeated post treatment, serum potassium 4.2 Carefully monitor serum potassium level while on spironolactone Repeat CMP in the morning  Acute transaminitis in the setting of ongoing alcohol use and chronic liver disease Elevated LFT  on presentation in the setting of cirrhosis, treated chronic hepatitis C Continue to trend level and repeat CMP in  the morning Avoid hepatotoxic agents No significant ascites for paracentesis on 04/03/2020  Hyperammonemia Levels are trending down, peaked at 102 Mentation is back to baseline Continue lactulose Continue to monitor  Alcohol use disorder CIWA protocol in place No evidence of alcohol withdrawal at the time of this visit TOC consulted to assist with resources for alcohol cessation, not receptive Continue multivitamin, thiamine and folic acid supplements  Polysubstance abuse including tobacco, alcohol and marijuana Per above, kindly see TOC note  Anemia of chronic disease in the setting of cirrhosis Hemoglobin 10.2 Continue to monitor H&H  Code Status: Full DVT Prophylaxis:  SCDs Family Communication:  None at bedside  *    Consultants:  Interventional radiology  Procedures:  None  Antimicrobials:  None   Status is: Inpatient   Dispo:  Patient From: Home  Planned Disposition: Home with Health Care Svc  Expected discharge date: 04/05/20  Medically stable for discharge: No, ongoing work-up and management of AKI.   Objective: Vitals:   04/03/20 0600 04/03/20 0615 04/03/20 0745 04/03/20 0800  BP: 107/73 115/82 113/72 115/79  Pulse: 98 98 99 99  Resp: 19 20 19  (!) 23  Temp:      TempSrc:      SpO2: 97% 98% 98% 99%   No intake or output data in the 24 hours ending 04/03/20 1311 There were no vitals filed for this visit.  Exam:  . General: 59 y.o. year-old male well developed well nourished in no acute distress.  Alert and oriented x3. . Cardiovascular: Regular rate and rhythm with no rubs or gallops.  No thyromegaly or JVD noted.   Marland Kitchen Respiratory: Clear to auscultation with no wheezes or rales. Good inspiratory effort. . Abdomen: Soft nontender nondistended with normal bowel sounds x4 quadrants. . Musculoskeletal: No lower extremity edema. 2/4 pulses in all 4 extremities. Marland Kitchen Psychiatry: Mood is appropriate for condition and setting   Data  Reviewed: CBC: Recent Labs  Lab 04/02/20 1353 04/03/20 0630  WBC 12.6* 9.8  HGB 10.0* 10.2*  HCT 29.4* 28.9*  MCV 98.7 97.6  PLT 201 664   Basic Metabolic Panel: Recent Labs  Lab 04/02/20 1353 04/03/20 0630  NA 132* 136  K 6.0* 4.2  CL 102 105  CO2 15* 19*  GLUCOSE 83 102*  BUN 20 26*  CREATININE 2.88* 1.75*  CALCIUM 10.3 9.8  MG  --  2.0  PHOS  --  3.5   GFR: CrCl cannot be calculated (Unknown ideal weight.). Liver Function Tests: Recent Labs  Lab 04/02/20 1353 04/03/20 0630  AST 716* 564*  ALT 112* 101*  ALKPHOS 103 95  BILITOT 1.9* 1.7*  PROT 7.4 6.7  ALBUMIN 2.8* 2.4*   No results for input(s): LIPASE, AMYLASE in the last 168 hours. Recent Labs  Lab 04/02/20 2238 04/03/20 0734  AMMONIA 102* 62*   Coagulation Profile: Recent Labs  Lab 04/03/20 0019  INR 1.9*   Cardiac Enzymes: No results for input(s): CKTOTAL, CKMB, CKMBINDEX, TROPONINI in the last 168 hours. BNP (last 3 results) No results for input(s): PROBNP in the last 8760 hours. HbA1C: No results for input(s): HGBA1C in the last 72 hours. CBG: Recent Labs  Lab 04/02/20 1346  GLUCAP 85   Lipid Profile: No results for input(s): CHOL, HDL, LDLCALC, TRIG, CHOLHDL, LDLDIRECT in the last 72 hours. Thyroid Function Tests: No results for input(s): TSH, T4TOTAL, FREET4,  T3FREE, THYROIDAB in the last 72 hours. Anemia Panel: No results for input(s): VITAMINB12, FOLATE, FERRITIN, TIBC, IRON, RETICCTPCT in the last 72 hours. Urine analysis:    Component Value Date/Time   COLORURINE AMBER (A) 07/15/2019 1944   APPEARANCEUR CLEAR 07/15/2019 1944   LABSPEC 1.019 07/15/2019 1944   PHURINE 6.0 07/15/2019 1944   GLUCOSEU NEGATIVE 07/15/2019 1944   HGBUR NEGATIVE 07/15/2019 1944   BILIRUBINUR SMALL (A) 07/15/2019 1944   KETONESUR NEGATIVE 07/15/2019 1944   PROTEINUR NEGATIVE 07/15/2019 1944   UROBILINOGEN 1.0 02/13/2010 1843   NITRITE NEGATIVE 07/15/2019 1944   LEUKOCYTESUR NEGATIVE  07/15/2019 1944   Sepsis Labs: @LABRCNTIP (procalcitonin:4,lacticidven:4)  ) Recent Results (from the past 240 hour(s))  SARS Coronavirus 2 by RT PCR (hospital order, performed in [facility] hospital lab) Nasopharyngeal Nasopharyngeal Swab     Status: None   Collection Time: 04/03/20  2:54 AM   Specimen: Nasopharyngeal Swab  Result Value Ref Range Status   SARS Coronavirus 2 NEGATIVE NEGATIVE Final    Comment: (NOTE) SARS-CoV-2 target nucleic acids are NOT DETECTED.  The SARS-CoV-2 RNA is generally detectable in upper and lower respiratory specimens during the acute phase of infection. The lowest concentration of SARS-CoV-2 viral copies this assay can detect is 250 copies / mL. A negative result does not preclude SARS-CoV-2 infection and should not be used as the sole basis for treatment or other patient management decisions.  A negative result may occur with improper specimen collection / handling, submission of specimen other than nasopharyngeal swab, presence of viral mutation(s) within the areas targeted by this assay, and inadequate number of viral copies (<250 copies / mL). A negative result must be combined with clinical observations, patient history, and epidemiological information.  Fact Sheet for Patients:   StrictlyIdeas.no  Fact Sheet for Healthcare Providers: BankingDealers.co.za  This test is not yet approved or  cleared by the [place] FDA and has been authorized for detection and/or diagnosis of SARS-CoV-2 by FDA under an Emergency Use Authorization (EUA).  This EUA will remain in effect (meaning this test can be used) for the duration of the COVID-19 declaration under Section 564(b)(1) of the Act, 21 U.S.C. section 360bbb-3(b)(1), unless the authorization is terminated or revoked sooner.  Performed at [facility], [place] [address], [place], [place] [address]       Studies: CT HEAD WO  CONTRAST  Result Date: 04/02/2020 CLINICAL DATA:  Altered mental status, slurred speech, hallucinations EXAM: CT HEAD WITHOUT CONTRAST TECHNIQUE: Contiguous axial images were obtained from the base of the skull through the vertex without intravenous contrast. COMPARISON:  08/21/2018 FINDINGS: Brain: Normal anatomic configuration. Mild parenchymal volume loss is slightly advanced in relation to expectation for age, but appears stable since prior examination. Mild periventricular white matter changes are again noted, likely reflecting the sequela of small vessel ischemia. No abnormal intra or extra-axial mass lesion or fluid collection. No abnormal mass effect or midline shift. No evidence of acute intracranial hemorrhage or infarct. Ventricular size is normal. Cerebellum unremarkable. Vascular: Unremarkable Skull: Intact Sinuses/Orbits: Chronic fracture of the medial wall of the right orbit. 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 These are both unchanged from prior examination. There is mucosal thickening involving the poorly aerated right frontal sinus, several ethmoid air cells, and the sphenoid sinuses along with small air-fluid levels within the sphenoid sinuses in keeping with mild paranasal sinus disease. This appears new since prior examination. Other:  Mastoid air cells and middle ear cavities are clear. IMPRESSION: No evidence of acute intracranial hemorrhage or infarct. Stable senescent changes. Mild paranasal sinus disease, new since prior examination. Electronically Signed   By: Fidela Salisbury MD   On: 04/02/2020 22:11   IR ABDOMEN US LIMITED  Result Date: 04/03/2020 CLINICAL DATA:  59 year old male referred for possible paracentesis EXAM: LIMITED ABDOMEN ULTRASOUND FOR ASCITES TECHNIQUE: Limited ultrasound survey for ascites was performed in all four abdominal quadrants. COMPARISON:  None. FINDINGS: Scant ascites within  the abdomen.  No paracentesis was performed. IMPRESSION: Scant ascites within the abdomen. Electronically Signed   By: Corrie Mckusick D.O.   On: 04/03/2020 11:44    Scheduled Meds: . folic acid  1 mg Oral Daily  . lactulose  10 g Oral BID  . multivitamin with minerals  1 tablet Oral Daily  . thiamine  100 mg Oral Daily   Or  . thiamine  100 mg Intravenous Daily    Continuous Infusions:   LOS: 1 day     Kayleen Memos, MD [facility] Pager [phone]  If 7PM-7AM, please contact night-coverage [email or web address] Password [email or web address] 04/03/2020, 1:11 PM

## 2020-04-03 NOTE — ED Notes (Signed)
Per IR, not enough fluid to tap, so paracentesis not done

## 2020-04-03 NOTE — Progress Notes (Signed)
IR requested by Dr. Marice Potter for possible image-guided paracentesis.  Limited abdominal US revealed no fluid that could be safely accessed with procedure today. Images sent to Dr. Earleen Newport for review. Informed patient that procedure will not occur today. All questions answered and concerns addressed. Will make Dr. [place] Crane aware.  IR available in future if needed.   Bea Graff Aprille Sawhney, PA-C 04/03/2020, 10:53 AM

## 2020-04-03 NOTE — Progress Notes (Signed)
CSW received consult for patient for substance abuse counseling for alcohol and marijuana use. CSW met with patient and his wife Suzette at bedside to complete discussion. CSW obtained permission from patient to have discussion with his wife present. Patient's reports daily alcohol and marijuana use. Patient reports he does not have any interest in ceasing his alcohol or marijuana use at this time and does not want any resources. Patient states he drinks beer. Patient reports his use does not impact his life negatively. CSW encouraged patient to reach out for assistance if needs arise or if he is interested in resources, he stated understanding and agreement.  Madilyn Fireman, MSW, LCSW-A Transitions of Care  Clinical Social Worker  [facility] Emergency Departments  Medical ICU [phone]

## 2020-04-03 NOTE — ED Notes (Signed)
Lunch Tray Ordered @ 1040. 

## 2020-04-03 NOTE — Progress Notes (Signed)
Chaplain responded to a request for an Advance Directive. The chaplain gave the patient the paperwork, but the patient was currently being seen by the nurse and getting admitted into the room. The chaplain will follow-up if needed.  Brion Aliment Chaplain Resident For questions concerning this note please contact me by pager [phone]

## 2020-04-04 DIAGNOSIS — F1010 Alcohol abuse, uncomplicated: Secondary | ICD-10-CM | POA: Diagnosis not present

## 2020-04-04 DIAGNOSIS — N179 Acute kidney failure, unspecified: Secondary | ICD-10-CM | POA: Diagnosis not present

## 2020-04-04 DIAGNOSIS — K7030 Alcoholic cirrhosis of liver without ascites: Secondary | ICD-10-CM | POA: Diagnosis not present

## 2020-04-04 LAB — COMPREHENSIVE METABOLIC PANEL
ALT: 96 U/L — ABNORMAL HIGH (ref 0–44)
AST: 386 U/L — ABNORMAL HIGH (ref 15–41)
Albumin: 2.4 g/dL — ABNORMAL LOW (ref 3.5–5.0)
Alkaline Phosphatase: 92 U/L (ref 38–126)
Anion gap: 12 (ref 5–15)
BUN: 14 mg/dL (ref 6–20)
CO2: 19 mmol/L — ABNORMAL LOW (ref 22–32)
Calcium: 9.9 mg/dL (ref 8.9–10.3)
Chloride: 109 mmol/L (ref 98–111)
Creatinine, Ser: 1.23 mg/dL (ref 0.61–1.24)
GFR calc Af Amer: >60 mL/min (ref >60)
GFR calc non Af Amer: >60 mL/min (ref >60)
Glucose, Bld: 129 mg/dL — ABNORMAL HIGH (ref 70–99)
Potassium: 3.9 mmol/L (ref 3.5–5.1)
Sodium: 140 mmol/L (ref 135–145)
Total Bilirubin: 0.9 mg/dL (ref 0.3–1.2)
Total Protein: 6.7 g/dL (ref 6.5–8.1)

## 2020-04-04 LAB — CBC
HCT: 30.1 % — ABNORMAL LOW (ref 39.0–52.0)
Hemoglobin: 10.1 g/dL — ABNORMAL LOW (ref 13.0–17.0)
MCH: 33.6 pg (ref 26.0–34.0)
MCHC: 33.6 g/dL (ref 30.0–36.0)
MCV: 100 fL (ref 80.0–100.0)
Platelets: 176 10*3/uL (ref 150–400)
RBC: 3.01 MIL/uL — ABNORMAL LOW (ref 4.22–5.81)
RDW: 14.4 % (ref 11.5–15.5)
WBC: 9.6 10*3/uL (ref 4.0–10.5)
nRBC: 0.3 % — ABNORMAL HIGH (ref 0.0–0.2)

## 2020-04-04 LAB — UREA NITROGEN, URINE: Urea Nitrogen, Ur: 390 mg/dL

## 2020-04-04 LAB — PROTIME-INR
INR: 2.1 — ABNORMAL HIGH (ref 0.8–1.2)
Prothrombin Time: 23 seconds — ABNORMAL HIGH (ref 11.4–15.2)

## 2020-04-04 LAB — AMMONIA: Ammonia: 68 umol/L — ABNORMAL HIGH (ref 9–35)

## 2020-04-04 MED ORDER — TRAMADOL HCL 50 MG PO TABS
50.0000 mg | ORAL_TABLET | Freq: Four times a day (QID) | ORAL | Status: DC | PRN
  Administered 2020-04-04 – 2020-04-07 (×8): 50 mg via ORAL
  Filled 2020-04-04 (×8): qty 1

## 2020-04-04 MED ORDER — PHYTONADIONE 5 MG PO TABS
10.0000 mg | ORAL_TABLET | Freq: Once | ORAL | Status: AC
  Administered 2020-04-04: 10 mg via ORAL
  Filled 2020-04-04: qty 2

## 2020-04-04 MED ORDER — LACTULOSE 10 GM/15ML PO SOLN
20.0000 g | Freq: Three times a day (TID) | ORAL | Status: DC
  Administered 2020-04-04 – 2020-04-05 (×4): 20 g via ORAL
  Filled 2020-04-04 (×5): qty 30

## 2020-04-04 NOTE — Progress Notes (Signed)
Occupational Therapy Evaluation Patient Details Name: Calvin [name] MRN: [id number] DOB: 1961/06/14 Today's Date: 04/04/2020    History of Present Illness 59 y.o. male with PMH of alcoholic cirrhosis, Hep C s/p treatment, alcohol abuse, HLD, HTN and gout who presented to ED with hallucinations and admitted for AKI, hyperkalemia and acute on chronic liver injury.   Clinical Impression   PTA, pt lives alone in second floor apartment. Pt married but does not live with wife. Pt reports Modified Independence with ADLs and mobility in the home using SPC. Pt has an aide that assists 3 hours/day daily for cooking and cleaning. Pt presents now with deficits mentioned below, requiring + 2 physical assist for safe transfers and short distance ambulation due to decreased endurance and impaired balance (LOB with physical assist needed to correct). Pt up to [place] for UB ADLs and Max A for LB ADLs due to deficits. HR up to 143bpm with activity, 120bpm at rest. At this time, pt unsafe to return home without 24/7 physical assist and unsure if wife able to provide assist as she assists in caring for brother in law as well. Recommend SNF for short term rehab to maximize safety and independence. Will continue to follow acutely and update recommendations as appropriate.     Follow Up Recommendations  SNF;Supervision/Assistance - 24 hour    Equipment Recommendations  3 in 1 bedside commode;Other (comment);Tub/shower seat (RW)    Recommendations for Other Services       Precautions / Restrictions Precautions Precautions: Fall Restrictions Weight Bearing Restrictions: No      Mobility Bed Mobility Overal bed mobility: Needs Assistance Bed Mobility: Supine to Sit     Supine to sit: Min assist;HOB elevated     General bed mobility comments: Min A for stabilizing trunk to sit EOB  Transfers Overall transfer level: Needs assistance Equipment used: Rolling walker (2 wheeled);Straight cane Transfers:  Sit to/from [facility] Sit to Stand: Mod assist;+2 physical assistance Stand pivot transfers: Min assist;+2 physical assistance       General transfer comment: Pt requires mod to max cues for sequencing with RW, safety with turning and pacing. Pt heavy Mod A x 2 for sit to stand from bedside with difficulty gaining balance and posture. Attempted first stand with SPC as this is what pt uses at home, but very unstable and proceeded with use of RW for remainder of session. Pt overall Min A x 2 for stand pivot and mobility with RW with one instance of LOB and Mod A to stabilize to decrease fall risk     Balance Overall balance assessment: Needs assistance Sitting-balance support: Bilateral upper extremity supported;Feet supported Sitting balance-Leahy Scale: Fair     Standing balance support: Bilateral upper extremity supported;During functional activity Standing balance-Leahy Scale: Poor Standing balance comment: Pt requires Min to mod A for maintaining standing balance with posterior LOB, increasing unsteadiness as pt fatigues                           ADL either performed or assessed with clinical judgement   ADL Overall ADL's : Needs assistance/impaired Eating/Feeding: Independent;Sitting   Grooming: Minimal assistance;Standing   Upper Body Bathing: Supervision/ safety;Sitting   Lower Body Bathing: Moderate assistance;Sit to/from stand   Upper Body Dressing : Set up;Sitting Upper Body Dressing Details (indicate cue type and reason): Setup to don hospital gown around back sitting EOB Lower Body Dressing: Maximal assistance;Sit to/from stand Lower Body  Dressing Details (indicate cue type and reason): Pt reports unable to bend to feet for donning socks, but also with increased difficulty crossing B LE for figure four position. Pt able to doff L sock, but required assistance to doff R sock and don grip socks Toilet Transfer: Stand-pivot;RW;BSC;+2 for  safety/equipment;+2 for physical assistance;Minimal assistance Toilet Transfer Details (indicate cue type and reason): simulated to recliner, depending on fatigue level when increases fall risk and unsteadiness on feet Toileting- Clothing Manipulation and Hygiene: Moderate assistance;Sit to/from stand       Functional mobility during ADLs: Minimal assistance;+2 for physical assistance;+2 for safety/equipment;Rolling walker;Cueing for safety;Cueing for sequencing General ADL Comments: Pt with major deficits in endurance, standing balance, and safety awareness impacting ability to complete LB ADLs without extensive assist     Vision Baseline Vision/History: Wears glasses Wears Glasses: Reading only Patient Visual Report: No change from baseline Vision Assessment?: No apparent visual deficits     Perception     Praxis      Pertinent Vitals/Pain Pain Assessment: Faces Faces Pain Scale: Hurts little more Pain Location: B knees during movement Pain Descriptors / Indicators: Grimacing;Guarding;Sore Pain Intervention(s): Limited activity within patient's tolerance;Monitored during session     Hand Dominance Right   Extremity/Trunk Assessment Upper Extremity Assessment Upper Extremity Assessment: Overall WFL for tasks assessed   Lower Extremity Assessment Lower Extremity Assessment: Defer to PT evaluation       Communication Communication Communication: No difficulties   Cognition Arousal/Alertness: Awake/alert Behavior During Therapy: WFL for tasks assessed/performed Overall Cognitive Status: Impaired/Different from baseline Area of Impairment: Safety/judgement;Awareness;Problem solving;Memory                     Memory: Decreased short-term memory   Safety/Judgement: Decreased awareness of safety;Decreased awareness of deficits Awareness: Emergent Problem Solving: Difficulty sequencing;Requires verbal cues;Requires tactile cues General Comments: Pt A&Ox4 but  demonstrates decreased awareness of deficits/safety. when asked how pt would safely navigate 13 stairs on his own at home, pt reports "it might take me an hour, but i will be able to do it"   General Comments  Pt HR 120bpm at rest, increasing to 142bpm max during mobility. Pt noted with SOB and 2/4 DOE after short distance mobility in room requiring seated rest break    Exercises     Shoulder Instructions      Home Living Family/patient expects to be discharged to:: Private residence Living Arrangements: Alone Available Help at Discharge: Family;Personal care attendant;Available PRN/intermittently Type of Home: Apartment Home Access: Stairs to enter Entrance Stairs-Number of Steps: 13 Entrance Stairs-Rails: Right Home Layout: One level     Bathroom Shower/Tub: Teacher, early years/pre: Standard     Home Equipment: Cane - single point;Grab bars - tub/shower   Additional Comments: Pt reports still married to wife but they do not live together. Pt's wife also assist with caring for pt's brother in law.       Prior Functioning/Environment Level of Independence: Independent with assistive device(s);Needs assistance  Gait / Transfers Assistance Needed: reports Modified Independence with SPC inside/outside of the home. ADL's / Homemaking Assistance Needed: Pt reports Modified Independence with ADLs and able to complete household IADLs sometimes. Pt has aide that assists 3 hours/day 7 days/wk for cleaning and cooking   Comments: Pt reports 2 falls in December        OT Problem List: Decreased activity tolerance;Impaired balance (sitting and/or standing);Decreased safety awareness;Decreased knowledge of use of DME or AE  OT Treatment/Interventions: Self-care/ADL training;Therapeutic exercise;Energy conservation;DME and/or AE instruction;Therapeutic activities;Patient/family education    OT Goals(Current goals can be found in the care plan section) Acute Rehab OT  Goals Patient Stated Goal: continue therapy OT Goal Formulation: With patient Time For Goal Achievement: 04/18/20 Potential to Achieve Goals: Good ADL Goals Pt Will Perform Grooming: with supervision;standing Pt Will Perform Lower Body Bathing: with min assist;sit to/from stand Pt Will Perform Lower Body Dressing: with min assist;sit to/from stand Pt Will Transfer to Toilet: with min assist;ambulating;bedside commode Pt Will Perform Toileting - Clothing Manipulation and hygiene: with min assist  OT Frequency: Min 2X/week   Barriers to D/C:            Co-evaluation PT/OT/SLP Co-Evaluation/Treatment: Yes Reason for Co-Treatment: Complexity of the patient's impairments (multi-system involvement);Necessary to address cognition/behavior during functional activity;For patient/therapist safety   OT goals addressed during session: ADL's and self-care      AM-PAC OT "6 Clicks" Daily Activity     Outcome Measure Help from another person eating meals?: None Help from another person taking care of personal grooming?: A Little Help from another person toileting, which includes using toliet, bedpan, or urinal?: A Lot Help from another person bathing (including washing, rinsing, drying)?: A Lot Help from another person to put on and taking off regular upper body clothing?: A Little Help from another person to put on and taking off regular lower body clothing?: A Lot 6 Click Score: 16   End of Session Equipment Utilized During Treatment: Gait belt;Rolling walker Nurse Communication: Mobility status  Activity Tolerance: Patient tolerated treatment well;Patient limited by fatigue Patient left: in chair;with call bell/phone within reach;with chair alarm set  OT Visit Diagnosis: Unsteadiness on feet (R26.81);Other abnormalities of gait and mobility (R26.89);Muscle weakness (generalized) (M62.81);History of falling (Z91.81)                Time: 1610-9604 OT Time Calculation (min): 25  min Charges:  OT General Charges $OT Visit: 1 Visit OT Evaluation $OT Eval Moderate Complexity: 1 Mod  Layla Maw, OTR/L  Layla Maw 04/04/2020, 10:39 AM

## 2020-04-04 NOTE — Evaluation (Signed)
Physical Therapy Evaluation Patient Details Name: Calvin [name] MRN: [id number] DOB: January 01, 1961 Today's Date: 04/04/2020   History of Present Illness  59 y.o. male with PMH of alcoholic cirrhosis, Hep C s/p treatment, alcohol abuse, HLD, HTN and gout who presented to ED with hallucinations and admitted for AKI, hyperkalemia and acute on chronic liver injury. +ETOH, marijuana, polypharm with daily use of oxycontin.  Clinical Impression    Pt presents with generalized weakness, tremors especially with fatigue, incoordination during mobility, mild LE pain secondary to arthritis per pt report, significantly impaired standing balance, and decreased activity tolerance. Pt to benefit from acute PT to address deficits. Pt required min-mod +2 for all mobility this day, ambulating short distance in room with use of RW and + LOB requiring PT and OT assist to correct. At baseline, pt is mod I with mobility, and uses SPC. PT presently recommending SNF level of care, pt lacking 24/7 support at home and is very high fall risk. PT to progress mobility as tolerated, and will continue to follow acutely.      Follow Up Recommendations SNF;Supervision/Assistance - 24 hour (may progress to HHPT level as withdrawal s/s improve, will need 24/7 if so)    Equipment Recommendations  None recommended by PT    Recommendations for Other Services       Precautions / Restrictions Precautions Precautions: Fall Restrictions Weight Bearing Restrictions: No      Mobility  Bed Mobility Overal bed mobility: Needs Assistance Bed Mobility: Supine to Sit     Supine to sit: Min assist;HOB elevated     General bed mobility comments: Min A for stabilizing trunk to sit EOB  Transfers Overall transfer level: Needs assistance Equipment used: Rolling walker (2 wheeled);Straight cane Transfers: Sit to/from [facility] Sit to Stand: Mod assist;+2 physical assistance Stand pivot transfers: Min assist;+2  physical assistance       General transfer comment: Mod +2 for power up, rising, and steadying upon standing. STS x2, first attempt with use of straight cane with + posterior LOB requiring PT/OT assist to slowly lower pt back to bed. Second attempt with RW, much more steady but required verbal cuing for hand placement when rising.  Ambulation/Gait Ambulation/Gait assistance: Min assist;+2 safety/equipment;+2 physical assistance;Mod assist Gait Distance (Feet): 25 Feet Assistive device: Rolling walker (2 wheeled) Gait Pattern/deviations: Step-through pattern;Decreased stride length;Trunk flexed;Shuffle;Drifts right/left Gait velocity: decr   General Gait Details: Min assist +2 for steadying, guiding pt and RW, occasional mod +2 for correcting LOB most notably when pt had RW too far out in front of him. Max multimodal cuing for proper use of RW, hallway navigation; pt VERY unsteady.  Stairs            Wheelchair Mobility    Modified Rankin (Stroke Patients Only)       Balance Overall balance assessment: Needs assistance Sitting-balance support: Bilateral upper extremity supported;Feet supported Sitting balance-Leahy Scale: Fair     Standing balance support: Bilateral upper extremity supported;During functional activity Standing balance-Leahy Scale: Poor Standing balance comment: Pt requires Min to mod A for maintaining standing balance with posterior LOB, increasing unsteadiness as pt fatigues                             Pertinent Vitals/Pain Pain Assessment: Faces Faces Pain Scale: Hurts little more Pain Location: B knees during movement, pt reports arthritic Pain Descriptors / Indicators: Grimacing;Guarding;Sore;Aching Pain Intervention(s): Monitored during session;Limited activity within  patient's tolerance;Repositioned    Home Living Family/patient expects to be discharged to:: Private residence Living Arrangements: Alone Available Help at Discharge:  Family;Personal care attendant;Available PRN/intermittently Type of Home: Apartment Home Access: Stairs to enter Entrance Stairs-Rails: Right Entrance Stairs-Number of Steps: 13 Home Layout: One level Home Equipment: Cane - single point;Grab bars - tub/shower Additional Comments: Pt reports still married to wife but they do not live together, husband stating "I have to stop drinking" when talking about wife. Pt's wife also assists with caring for pt's brother in law.    Prior Function Level of Independence: Independent with assistive device(s);Needs assistance   Gait / Transfers Assistance Needed: reports Modified Independence with SPC inside/outside of the home.  ADL's / Homemaking Assistance Needed: Pt reports Modified Independence with ADLs and able to complete household IADLs sometimes. Pt has aide that assists 3 hours/day 7 days/wk for cleaning and cooking  Comments: Pt reports 2 falls in December     Hand Dominance   Dominant Hand: Right    Extremity/Trunk Assessment   Upper Extremity Assessment Upper Extremity Assessment: Defer to OT evaluation    Lower Extremity Assessment Lower Extremity Assessment: Generalized weakness;Difficult to assess due to impaired cognition (difficult to assess due to tremors d/t suspected withdrawals)    Cervical / Trunk Assessment Cervical / Trunk Assessment: Normal  Communication   Communication: No difficulties  Cognition Arousal/Alertness: Awake/alert Behavior During Therapy: WFL for tasks assessed/performed Overall Cognitive Status: Impaired/Different from baseline Area of Impairment: Safety/judgement;Awareness;Problem solving;Memory                     Memory: Decreased short-term memory   Safety/Judgement: Decreased awareness of safety;Decreased awareness of deficits Awareness: Emergent Problem Solving: Difficulty sequencing;Requires verbal cues;Requires tactile cues General Comments: Pt A&Ox4 but demonstrates decreased  awareness of deficits/safety. when asked how pt would safely navigate 13 stairs on his own at home, pt reports "it might take me an hour, but i will be able to do it"      General Comments General comments (skin integrity, edema, etc.): HR 120-142 bpm during mobility, RN notified. DOE 2/4 during mobility, recovers with rest    Exercises     Assessment/Plan    PT Assessment Patient needs continued PT services  PT Problem List Decreased strength;Decreased mobility;Decreased safety awareness;Decreased activity tolerance;Decreased balance;Decreased range of motion;Decreased knowledge of precautions;Decreased cognition;Decreased knowledge of use of DME;Pain;Cardiopulmonary status limiting activity       PT Treatment Interventions DME instruction;Therapeutic activities;Gait training;Therapeutic exercise;Patient/family education;Balance training;Stair training;Functional mobility training;Neuromuscular re-education    PT Goals (Current goals can be found in the Care Plan section)  Acute Rehab PT Goals Patient Stated Goal: get stronger PT Goal Formulation: With patient Time For Goal Achievement: 04/18/20 Potential to Achieve Goals: Good    Frequency Min 3X/week   Barriers to discharge Decreased caregiver support      Co-evaluation PT/OT/SLP Co-Evaluation/Treatment: Yes Reason for Co-Treatment: Complexity of the patient's impairments (multi-system involvement);Necessary to address cognition/behavior during functional activity;For patient/therapist safety PT goals addressed during session: Mobility/safety with mobility;Balance;Proper use of DME OT goals addressed during session: ADL's and self-care       AM-PAC PT "6 Clicks" Mobility  Outcome Measure Help needed turning from your back to your side while in a flat bed without using bedrails?: A Little Help needed moving from lying on your back to sitting on the side of a flat bed without using bedrails?: A Little Help needed moving to  and from a bed to  a chair (including a wheelchair)?: A Lot Help needed standing up from a chair using your arms (e.g., wheelchair or bedside chair)?: A Lot Help needed to walk in hospital room?: A Lot Help needed climbing 3-5 steps with a railing? : Total 6 Click Score: 13    End of Session Equipment Utilized During Treatment: Gait belt Activity Tolerance: Patient limited by fatigue Patient left: in chair;with chair alarm set;with call bell/phone within reach Nurse Communication: Mobility status PT Visit Diagnosis: Unsteadiness on feet (R26.81);History of falling (Z91.81);Muscle weakness (generalized) (M62.81)    Time: 5456-2563 PT Time Calculation (min) (ACUTE ONLY): 25 min   Charges:   PT Evaluation $PT Eval Low Complexity: 1 Low          Sheryle Vice E, PT Acute Rehabilitation Services Pager [phone]  Office [phone]   Brenee Gajda D Elonda Husky 04/04/2020, 11:21 AM

## 2020-04-04 NOTE — Progress Notes (Signed)
PROGRESS NOTE  AZTLAN COLL HYQ:[id number] DOB: 04/01/1961 DOA: 04/02/2020 PCP: Jenny Reichmann, PA-C  HPI/Recap of past 24 hours: HPI: Calvin [name] is a 59 y.o. male with PMH of alcoholic cirrhosis, Hep C s/p treatment, alcohol abuse, HLD, HTN and gout who presented to ED with hallucinations and admitted for AKI, hyperkalemia and acute on chronic liver injury.  History provided by patient and his wife. Wife reports she started noticing confusion and hallucinations yesterday which have persisted today. Reports he was seeing other people in the room and talking to them and at times has been unsure about where he is; denies this happening previously. Patient reports he did not take Norvasc for a few days but has otherwise been taking other medications, he thinks as prescribed. Reports also taking Oxycodone for chronic hip and knee pain. Continues to drink at least two beers daily and last drank this afternoon around 1400. Wife reports that he was having trouble walking yesterday and with the confusion, she thought he was having a stroke. Patient reports his knees were bothering him and that is why he couldn't walk. Wife also reports that he seemed to have abdominal pain yesterday but patient denies now or currently. Denies headache, dizziness, fever, chills, cough, SOB, chest pain, abdominal pain, nausea, vomiting, diarrhea, constipation, dysuria, hematuria, hematochezia, melena, speech difficulty, trouble eating or any other complaints.  In the ED: Mild tachycardia and borderline soft BP's otherwise stable on room air. Labs remarkable for Na 132, K 6.0, CO2 15, Cr 2.88, AST 716, ALT 112, TSB 1.9, WBC 12.6, Hgb 10.0, Alcohol level < 10. Ammonia and INR pending.  CT Head: non-acute  Patient was given Calcium gluconate, Insulin and D50, Lokelma. Admission requested for AKI and hyperkalemia.  04/03/20: Patient was seen and examined at his bedside in the ED.  He reports hurting all over.  He  is alert and oriented x3.  Unable to obtain paracentesis, by interventional radiology, no fluid that could be safely accessed.  Subjective  She has some tachycardia today which has improved after receiving Ativan, he is complaining of lower extremity pain which has responded to tramadol.   Assessment/Plan: Principal Problem:   AKI (acute kidney injury) ([place]) Active Problems:   Essential hypertension   Alcohol abuse   Chronic left hip pain   Hyponatremia   Hyperlipidemia   Decompensated hepatic cirrhosis (HCC)   Ascites due to alcoholic cirrhosis (HCC)   Chronic hepatitis C without hepatic coma (HCC)   Hyperkalemia   Hallucinations   Anemia of chronic disease  Acute toxic metabolic encephalopathy - likely multifactorial secondary to hyperammonemia in the setting of cirrhosis, electrolyte derangement, polypharmacy. -Ammonia this morning significantly elevated at 68, I have increased his lactulose to 20 g oral 3 times daily.  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 -Back on Aldactone, continue to monitor closely.  Hypoalbuminemia in the setting of chronic liver disease Counseled on the importance of alcohol cessation in the setting of chronic liver disease, not receptive Avoid hepatotoxic agents  Resolved hyperkalemia, post treatment Presented with potassium level of 6.0, treated -This morning it is 3.9, will monitor closely now he is back on Aldactone.  Acute transaminitis in the setting of ongoing alcohol use and alcoholic liver disease - AST>ALT which is typical for alcoholic liver disease. Elevated LFT on presentation in the setting of cirrhosis, treated chronic hepatitis C Continue  to trend level and repeat CMP in the morning Avoid hepatotoxic agents No significant ascites for paracentesis on 04/03/2020 -Events of coagulopathy with INR of 2.1, he received  vitamin K today.  Hyperammonemia -Level is again up today at 68, I have increased his lactulose, monitor ammonia closely.  Alcohol use disorder CIWA protocol in place, this morning he is tachycardic which did respond to Ativan, monitor closely for withdrawals TOC consulted to assist with resources for alcohol cessation, not receptive Continue multivitamin, thiamine and folic acid supplements  Polysubstance abuse including tobacco, alcohol and marijuana Per above, kindly see TOC note  Anemia of chronic disease in the setting of cirrhosis Hemoglobin 10.2 Continue to monitor H&H  Code Status: Full DVT Prophylaxis:  SCDs(no chemical prophylaxis in the setting of coagulopathy with INR 2.1) Family Communication:  None at bedside  *    Consultants:  Interventional radiology  Procedures:  None  Antimicrobials:  None   Status is: Inpatient   Dispo:  Patient From: Home  Planned Disposition: Home with Health Care Svc  Expected discharge date: 04/05/20  Medically stable for discharge: No, ongoing work-up and management of AKI.   Objective: Vitals:   04/04/20 0806 04/04/20 1037 04/04/20 1209 04/04/20 1230  BP: 128/80 128/80    Pulse: (!) 115 (!) 115    Resp: 17 17 (!) 22 (!) 24  Temp: 98.6 F (37 C) 98.6 F (37 C)    TempSrc:  Oral    SpO2: 100%     Weight:  83.5 kg    Height:  5\' 7"  (1.702 m)      Intake/Output Summary (Last 24 hours) at 04/04/2020 1334 Last data filed at 04/04/2020 1031 Gross per 24 hour  Intake 720 ml  Output 1000 ml  Net -280 ml   Filed Weights   04/04/20 0500 04/04/20 1037  Weight: 83.5 kg 83.5 kg    Exam:  Awake Alert, Oriented X 3, No new F.N deficits, Normal affect Symmetrical Chest wall movement, Good air movement bilaterally, CTAB RRR,No Gallops,Rubs or new Murmurs, No Parasternal Heave +ve B.Sounds, Abd Soft, No tenderness, No rebound - guarding or rigidity. No Cyanosis, Clubbing or edema, No new Rash or bruise     Data  Reviewed: CBC: Recent Labs  Lab 04/02/20 1353 04/03/20 0630 04/04/20 0416  WBC 12.6* 9.8 9.6  HGB 10.0* 10.2* 10.1*  HCT 29.4* 28.9* 30.1*  MCV 98.7 97.6 100.0  PLT 201 193 295   Basic Metabolic Panel: Recent Labs  Lab 04/02/20 1353 04/03/20 0630 04/04/20 0416  NA 132* 136 140  K 6.0* 4.2 3.9  CL 102 105 109  CO2 15* 19* 19*  GLUCOSE 83 102* 129*  BUN 20 26* 14  CREATININE 2.88* 1.75* 1.23  CALCIUM 10.3 9.8 9.9  MG  --  2.0  --   PHOS  --  3.5  --    GFR: Estimated Creatinine Clearance: 66 mL/min (by C-G formula based on SCr of 1.23 mg/dL). Liver Function Tests: Recent Labs  Lab 04/02/20 1353 04/03/20 0630 04/04/20 0416  AST 716* 564* 386*  ALT 112* 101* 96*  ALKPHOS 103 95 92  BILITOT 1.9* 1.7* 0.9  PROT 7.4 6.7 6.7  ALBUMIN 2.8* 2.4* 2.4*   No results for input(s): LIPASE, AMYLASE in the last 168 hours. Recent Labs  Lab 04/02/20 2238 04/03/20 0734 04/04/20 0416  AMMONIA 102* 62* 68*   Coagulation Profile: Recent Labs  Lab 04/03/20 0019 04/04/20 0416  INR 1.9* 2.1*   Cardiac  Enzymes: No results for input(s): CKTOTAL, CKMB, CKMBINDEX, TROPONINI in the last 168 hours. BNP (last 3 results) No results for input(s): PROBNP in the last 8760 hours. HbA1C: No results for input(s): HGBA1C in the last 72 hours. CBG: Recent Labs  Lab 04/02/20 1346  GLUCAP 85   Lipid Profile: No results for input(s): CHOL, HDL, LDLCALC, TRIG, CHOLHDL, LDLDIRECT in the last 72 hours. Thyroid Function Tests: No results for input(s): TSH, T4TOTAL, FREET4, T3FREE, THYROIDAB in the last 72 hours. Anemia Panel: No results for input(s): VITAMINB12, FOLATE, FERRITIN, TIBC, IRON, RETICCTPCT in the last 72 hours. Urine analysis:    Component Value Date/Time   COLORURINE AMBER (A) 07/15/2019 1944   APPEARANCEUR CLEAR 07/15/2019 1944   LABSPEC 1.019 07/15/2019 1944   PHURINE 6.0 07/15/2019 1944   GLUCOSEU NEGATIVE 07/15/2019 1944   HGBUR NEGATIVE 07/15/2019 1944    BILIRUBINUR SMALL (A) 07/15/2019 1944   KETONESUR NEGATIVE 07/15/2019 1944   PROTEINUR NEGATIVE 07/15/2019 1944   UROBILINOGEN 1.0 02/13/2010 1843   NITRITE NEGATIVE 07/15/2019 1944   LEUKOCYTESUR NEGATIVE 07/15/2019 1944   Sepsis Labs: @LABRCNTIP (procalcitonin:4,lacticidven:4)  ) Recent Results (from the past 240 hour(s))  SARS Coronavirus 2 by RT PCR (hospital order, performed in [place] hospital lab) Nasopharyngeal Nasopharyngeal Swab     Status: None   Collection Time: 04/03/20  2:54 AM   Specimen: Nasopharyngeal Swab  Result Value Ref Range Status   SARS Coronavirus 2 NEGATIVE NEGATIVE Final    Comment: (NOTE) SARS-CoV-2 target nucleic acids are NOT DETECTED.  The SARS-CoV-2 RNA is generally detectable in upper and lower respiratory specimens during the acute phase of infection. The lowest concentration of SARS-CoV-2 viral copies this assay can detect is 250 copies / mL. A negative result does not preclude SARS-CoV-2 infection and should not be used as the sole basis for treatment or other patient management decisions.  A negative result may occur with improper specimen collection / handling, submission of specimen other than nasopharyngeal swab, presence of viral mutation(s) within the areas targeted by this assay, and inadequate number of viral copies (<250 copies / mL). A negative result must be combined with clinical observations, patient history, and epidemiological information.  Fact Sheet for Patients:   StrictlyIdeas.no  Fact Sheet for Healthcare Providers: BankingDealers.co.za  This test is not yet approved or  cleared by the [place] FDA and has been authorized for detection and/or diagnosis of SARS-CoV-2 by FDA under an Emergency Use Authorization (EUA).  This EUA will remain in effect (meaning this test can be used) for the duration of the COVID-19 declaration under Section 564(b)(1) of the Act, 21  U.S.C. section 360bbb-3(b)(1), unless the authorization is terminated or revoked sooner.  Performed at [facility], [place] [address], [place],  [address]       Studies: No results found.  Scheduled Meds: . folic acid  1 mg Oral Daily  . gabapentin  100 mg Oral BID  . lactulose  20 g Oral TID  . multivitamin with minerals  1 tablet Oral Daily  . nicotine  21 mg Transdermal Daily  . spironolactone  12.5 mg Oral Daily  . thiamine  100 mg Oral Daily   Or  . thiamine  100 mg Intravenous Daily    Continuous Infusions:   LOS: 2 days     Phillips Climes, MD [facility]  If 7PM-7AM, please contact night-coverage [email or web address] Password [facility] 04/04/2020, 1:34 PM

## 2020-04-05 DIAGNOSIS — K7030 Alcoholic cirrhosis of liver without ascites: Secondary | ICD-10-CM | POA: Diagnosis not present

## 2020-04-05 DIAGNOSIS — N179 Acute kidney failure, unspecified: Secondary | ICD-10-CM | POA: Diagnosis not present

## 2020-04-05 DIAGNOSIS — F1010 Alcohol abuse, uncomplicated: Secondary | ICD-10-CM | POA: Diagnosis not present

## 2020-04-05 LAB — COMPREHENSIVE METABOLIC PANEL
ALT: 81 U/L — ABNORMAL HIGH (ref 0–44)
AST: 225 U/L — ABNORMAL HIGH (ref 15–41)
Albumin: 2.3 g/dL — ABNORMAL LOW (ref 3.5–5.0)
Alkaline Phosphatase: 86 U/L (ref 38–126)
Anion gap: 9 (ref 5–15)
BUN: 9 mg/dL (ref 6–20)
CO2: 20 mmol/L — ABNORMAL LOW (ref 22–32)
Calcium: 9.7 mg/dL (ref 8.9–10.3)
Chloride: 108 mmol/L (ref 98–111)
Creatinine, Ser: 1.11 mg/dL (ref 0.61–1.24)
GFR calc Af Amer: >60 mL/min (ref >60)
GFR calc non Af Amer: >60 mL/min (ref >60)
Glucose, Bld: 108 mg/dL — ABNORMAL HIGH (ref 70–99)
Potassium: 4.2 mmol/L (ref 3.5–5.1)
Sodium: 137 mmol/L (ref 135–145)
Total Bilirubin: 1.2 mg/dL (ref 0.3–1.2)
Total Protein: 6.6 g/dL (ref 6.5–8.1)

## 2020-04-05 LAB — CBC
HCT: 28.4 % — ABNORMAL LOW (ref 39.0–52.0)
Hemoglobin: 9.6 g/dL — ABNORMAL LOW (ref 13.0–17.0)
MCH: 33.6 pg (ref 26.0–34.0)
MCHC: 33.8 g/dL (ref 30.0–36.0)
MCV: 99.3 fL (ref 80.0–100.0)
Platelets: 157 10*3/uL (ref 150–400)
RBC: 2.86 MIL/uL — ABNORMAL LOW (ref 4.22–5.81)
RDW: 14 % (ref 11.5–15.5)
WBC: 11.3 10*3/uL — ABNORMAL HIGH (ref 4.0–10.5)
nRBC: 0.6 % — ABNORMAL HIGH (ref 0.0–0.2)

## 2020-04-05 LAB — PROTIME-INR
INR: 1.6 — ABNORMAL HIGH (ref 0.8–1.2)
Prothrombin Time: 18.2 seconds — ABNORMAL HIGH (ref 11.4–15.2)

## 2020-04-05 LAB — AMMONIA: Ammonia: 47 umol/L — ABNORMAL HIGH (ref 9–35)

## 2020-04-05 MED ORDER — PROPRANOLOL HCL 10 MG PO TABS
10.0000 mg | ORAL_TABLET | Freq: Two times a day (BID) | ORAL | Status: DC
  Administered 2020-04-05 – 2020-04-07 (×5): 10 mg via ORAL
  Filled 2020-04-05 (×6): qty 1

## 2020-04-05 MED ORDER — PHYTONADIONE 5 MG PO TABS
5.0000 mg | ORAL_TABLET | Freq: Once | ORAL | Status: AC
  Administered 2020-04-05: 5 mg via ORAL
  Filled 2020-04-05: qty 1

## 2020-04-05 MED ORDER — PANTOPRAZOLE SODIUM 40 MG PO TBEC
40.0000 mg | DELAYED_RELEASE_TABLET | Freq: Every day | ORAL | Status: DC
  Administered 2020-04-05 – 2020-04-07 (×3): 40 mg via ORAL
  Filled 2020-04-05 (×3): qty 1

## 2020-04-05 NOTE — Progress Notes (Signed)
PROGRESS NOTE  Calvin [name] CWC:[id number] DOB: [birth year] DOA: 04/02/2020 PCP: Jenny Reichmann, PA-C  HPI/brief narrative HPI: Calvin [name] is a 59 y.o. male with PMH of alcoholic cirrhosis, Hep C s/p treatment, alcohol abuse, HLD, HTN and gout who presented to ED with hallucinations and admitted for AKI, hyperkalemia and acute on chronic liver injury.  History provided by patient and his wife. Wife reports she started noticing confusion and hallucinations yesterday which have persisted today. Reports he was seeing other people in the room and talking to them and at times has been unsure about where he is; denies this happening previously. Patient reports he did not take Norvasc for a few days but has otherwise been taking other medications, he thinks as prescribed. Reports also taking Oxycodone for chronic hip and knee pain. Continues to drink at least two beers daily and last drank this afternoon around 1400. Wife reports that he was having trouble walking yesterday and with the confusion, she thought he was having a stroke. Patient reports his knees were bothering him and that is why he couldn't walk. Wife also reports that he seemed to have abdominal pain yesterday but patient denies now or currently. Denies headache, dizziness, fever, chills, cough, SOB, chest pain, abdominal pain, nausea, vomiting, diarrhea, constipation, dysuria, hematuria, hematochezia, melena, speech difficulty, trouble eating or any other complaints.  In the ED: Mild tachycardia and borderline soft BP's otherwise stable on room air. Labs remarkable for Na 132, K 6.0, CO2 15, Cr 2.88, AST 716, ALT 112, TSB 1.9, WBC 12.6, Hgb 10.0, Alcohol level < 10. Ammonia and INR pending.  CT Head: non-acute  Patient was given Calcium gluconate, Insulin and D50, Lokelma. Admission requested for AKI and hyperkalemia.  04/03/20: Patient was seen and examined at his bedside in the ED.  He reports hurting all over.  He is alert  and oriented x3.  Unable to obtain paracentesis, by interventional radiology, no fluid that could be safely accessed.  Subjective  He remains tachycardic, he did require some Ativan yesterday, report his pain has improved with tramadol .  He had some confusion yesterday, but this has resolved by now.  Assessment/Plan: Principal Problem:   AKI (acute kidney injury) ([place]) Active Problems:   Essential hypertension   Alcohol abuse   Chronic left hip pain   Hyponatremia   Hyperlipidemia   Decompensated hepatic cirrhosis (HCC)   Ascites due to alcoholic cirrhosis (HCC)   Chronic hepatitis C without hepatic coma (HCC)   Hyperkalemia   Hallucinations   Anemia of chronic disease  Acute toxic metabolic encephalopathy - likely multifactorial secondary to hyperammonemia in the setting of cirrhosis, electrolyte derangement, polypharmacy. 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 -Back on Aldactone, continue to monitor closely.  Hypoalbuminemia in the setting of chronic liver disease Counseled on the importance of alcohol cessation in the setting of chronic liver disease, not receptive Avoid hepatotoxic agents  Resolved hyperkalemia, post treatment Presented with potassium level of 6.0, treated -This morning it is 3.9, will monitor closely now he is back on Aldactone.  Acute transaminitis in the setting of ongoing alcohol use and alcoholic liver disease - AST>ALT which is typical for alcoholic liver disease. Elevated LFT on presentation in the setting of  cirrhosis, treated chronic hepatitis C Continue to trend level and repeat CMP in the morning Avoid hepatotoxic agents No significant ascites for paracentesis on  04/03/2020 -Events of coagulopathy with INR of 2.1, he received vitamin K , INR has improved to 1.6, will give another 5 of p.o. vitamin K today. -He is started on low-dose propranolol, especially he is with some tachycardia.  Hyperammonemia -Level is again up today at 68, I have increased his lactulose, monitor ammonia closely.  Alcohol use disorder CIWA protocol in place, he did require some Ativan yesterday given some confusion, and tachycardia this confusion has resolved, he remains tachycardic so he was started on low-dose propranolol as well. TOC consulted to assist with resources for alcohol cessation, not receptive Continue multivitamin, thiamine and folic acid supplements  Polysubstance abuse including tobacco, alcohol and marijuana Per above, kindly see TOC note  Anemia of chronic disease in the setting of cirrhosis -Appears stable, continue to monitor H&H closely  Code Status: Full DVT Prophylaxis:  SCDs(no chemical prophylaxis in the setting of coagulopathy with INR 2.1) Family Communication:  None at bedside    Consultants:  Interventional radiology  Procedures:  None  Antimicrobials:  None   Status is: Inpatient   Dispo:  Patient From: Home  Planned Disposition: SNF  Expected discharge date: in 1 to 2 days  Medically stable for discharge: No, remains tachycardic with hyperammonemia   Objective: Vitals:   04/04/20 2252 04/05/20 0514 04/05/20 0851 04/05/20 1157  BP:  116/81 119/81 113/73  Pulse:  (!) 110 (!) 113 (!) 105  Resp: 20 20 15    Temp:  98 F (36.7 C) 98.1 F (36.7 C)   TempSrc:  Oral    SpO2:  97% 98% 97%  Weight:      Height:        Intake/Output Summary (Last 24 hours) at 04/05/2020 1207 Last data filed at 04/05/2020 0500 Gross per 24 hour  Intake --  Output 550 ml  Net -550 ml   Filed Weights   04/04/20 0500 04/04/20 1037  Weight: 83.5 kg 83.5 kg    Exam:  Awake Alert, Oriented X 3, No new F.N deficits, Normal  affect Symmetrical Chest wall movement, Good air movement bilaterally, CTAB Tachycardic,No Gallops,Rubs or new Murmurs, No Parasternal Heave +ve B.Sounds, Abd Soft, No tenderness, No rebound - guarding or rigidity. No Cyanosis, Clubbing or edema, No new Rash or bruise     Data Reviewed: CBC: Recent Labs  Lab 04/02/20 1353 04/03/20 0630 04/04/20 0416 04/05/20 0347  WBC 12.6* 9.8 9.6 11.3*  HGB 10.0* 10.2* 10.1* 9.6*  HCT 29.4* 28.9* 30.1* 28.4*  MCV 98.7 97.6 100.0 99.3  PLT 201 193 176 671   Basic Metabolic Panel: Recent Labs  Lab 04/02/20 1353 04/03/20 0630 04/04/20 0416 04/05/20 0347  NA 132* 136 140 137  K 6.0* 4.2 3.9 4.2  CL 102 105 109 108  CO2 15* 19* 19* 20*  GLUCOSE 83 102* 129* 108*  BUN 20 26* 14 9  CREATININE 2.88* 1.75* 1.23 1.11  CALCIUM 10.3 9.8 9.9 9.7  MG  --  2.0  --   --   PHOS  --  3.5  --   --    GFR: Estimated Creatinine Clearance: 73.2 mL/min (by C-G formula based on SCr of 1.11 mg/dL). Liver Function Tests: Recent Labs  Lab 04/02/20 1353 04/03/20 0630 04/04/20 0416 04/05/20 0347  AST 716* 564* 386* 225*  ALT 112* 101* 96* 81*  ALKPHOS 103 95  92 86  BILITOT 1.9* 1.7* 0.9 1.2  PROT 7.4 6.7 6.7 6.6  ALBUMIN 2.8* 2.4* 2.4* 2.3*   No results for input(s): LIPASE, AMYLASE in the last 168 hours. Recent Labs  Lab 04/02/20 2238 04/03/20 0734 04/04/20 0416 04/05/20 0347  AMMONIA 102* 62* 68* 47*   Coagulation Profile: Recent Labs  Lab 04/03/20 0019 04/04/20 0416 04/05/20 0347  INR 1.9* 2.1* 1.6*   Cardiac Enzymes: No results for input(s): CKTOTAL, CKMB, CKMBINDEX, TROPONINI in the last 168 hours. BNP (last 3 results) No results for input(s): PROBNP in the last 8760 hours. HbA1C: No results for input(s): HGBA1C in the last 72 hours. CBG: Recent Labs  Lab 04/02/20 1346  GLUCAP 85   Lipid Profile: No results for input(s): CHOL, HDL, LDLCALC, TRIG, CHOLHDL, LDLDIRECT in the last 72 hours. Thyroid Function Tests: No  results for input(s): TSH, T4TOTAL, FREET4, T3FREE, THYROIDAB in the last 72 hours. Anemia Panel: No results for input(s): VITAMINB12, FOLATE, FERRITIN, TIBC, IRON, RETICCTPCT in the last 72 hours. Urine analysis:    Component Value Date/Time   COLORURINE AMBER (A) 07/15/2019 1944   APPEARANCEUR CLEAR 07/15/2019 1944   LABSPEC 1.019 07/15/2019 1944   PHURINE 6.0 07/15/2019 1944   GLUCOSEU NEGATIVE 07/15/2019 1944   HGBUR NEGATIVE 07/15/2019 1944   BILIRUBINUR SMALL (A) 07/15/2019 1944   KETONESUR NEGATIVE 07/15/2019 1944   PROTEINUR NEGATIVE 07/15/2019 1944   UROBILINOGEN 1.0 02/13/2010 1843   NITRITE NEGATIVE 07/15/2019 1944   LEUKOCYTESUR NEGATIVE 07/15/2019 1944   Sepsis Labs: @LABRCNTIP (procalcitonin:4,lacticidven:4)  ) Recent Results (from the past 240 hour(s))  SARS Coronavirus 2 by RT PCR (hospital order, performed in [place] hospital lab) Nasopharyngeal Nasopharyngeal Swab     Status: None   Collection Time: 04/03/20  2:54 AM   Specimen: Nasopharyngeal Swab  Result Value Ref Range Status   SARS Coronavirus 2 NEGATIVE NEGATIVE Final    Comment: (NOTE) SARS-CoV-2 target nucleic acids are NOT DETECTED.  The SARS-CoV-2 RNA is generally detectable in upper and lower respiratory specimens during the acute phase of infection. The lowest concentration of SARS-CoV-2 viral copies this assay can detect is 250 copies / mL. A negative result does not preclude SARS-CoV-2 infection and should not be used as the sole basis for treatment or other patient management decisions.  A negative result may occur with improper specimen collection / handling, submission of specimen other than nasopharyngeal swab, presence of viral mutation(s) within the areas targeted by this assay, and inadequate number of viral copies (<250 copies / mL). A negative result must be combined with clinical observations, patient history, and epidemiological information.  Fact Sheet for Patients:    StrictlyIdeas.no  Fact Sheet for Healthcare Providers: BankingDealers.co.za  This test is not yet approved or  cleared by the [place] FDA and has been authorized for detection and/or diagnosis of SARS-CoV-2 by FDA under an Emergency Use Authorization (EUA).  This EUA will remain in effect (meaning this test can be used) for the duration of the COVID-19 declaration under Section 564(b)(1) of the Act, 21 U.S.C. section 360bbb-3(b)(1), unless the authorization is terminated or revoked sooner.  Performed at [facility], [place] [address] [place], [place] [address]       Studies: No results found.  Scheduled Meds: . folic acid  1 mg Oral Daily  . gabapentin  100 mg Oral BID  . lactulose  20 g Oral TID  . multivitamin with minerals  1 tablet Oral Daily  . nicotine  21 mg Transdermal Daily  . spironolactone  12.5 mg Oral Daily  . thiamine  100 mg Oral Daily   Or  . thiamine  100 mg Intravenous Daily    Continuous Infusions:   LOS: 3 days     Phillips Climes, MD [facility]  If 7PM-7AM, please contact night-coverage [email or web address] Password [facility] 04/05/2020, 12:07 PM

## 2020-04-05 NOTE — TOC Initial Note (Signed)
Transition of Care [facility]) - Initial/Assessment Note    Patient Details  Name: Calvin [name] MRN: [id number] Date of Birth: 1961-02-28  Transition of Care [facility]) CM/SW Contact:    Angelita Ingles, RN Phone Number: [phone]  04/05/2020, 3:49 PM  Clinical Narrative:     CM consulted to see patient with need for SNF placement. Patient states that his main concern with going home is because he has stairs at his home. Patient is agreeable to snf workup but states that he might have another plan because he has a family member that has a house with only a couple of steps and he may be able to stay with them until he builds his strength up to go back to his home. Patient agreeable to CM starting the process but states things may change. CM will initiate bed search.                Expected Discharge Plan: Skilled Nursing Facility Barriers to Discharge: Continued Medical Work up   Patient Goals and CMS Choice Patient states their goals for this hospitalization and ongoing recovery are:: Wants to go get better to go home CMS Medicare.gov Compare Post Acute Care list provided to:: Patient Choice offered to / list presented to : Patient  Expected Discharge Plan and Services Expected Discharge Plan: [place] In-house Referral: NA Discharge Planning Services: CM Consult Post Acute Care Choice: [place] Living arrangements for the past 2 months: Single Family Home                 DME Arranged: N/A DME Agency: NA       HH Arranged: NA HH Agency: NA        Prior Living Arrangements/Services Living arrangements for the past 2 months: Single Family Home Lives with:: Spouse Patient language and need for interpreter reviewed:: Yes Do you feel safe going back to the place where you live?: Yes      Need for Family Participation in Patient Care: Yes (Comment) Care giver support system in place?: Yes (comment)   Criminal Activity/Legal Involvement Pertinent to  Current Situation/Hospitalization: No - Comment as needed  Activities of Daily Living Home Assistive Devices/Equipment: Cane (specify quad or straight) ADL Screening (condition at time of admission) Patient's cognitive ability adequate to safely complete daily activities?: Yes Is the patient deaf or have difficulty hearing?: No Does the patient have difficulty seeing, even when wearing glasses/contacts?: No Does the patient have difficulty concentrating, remembering, or making decisions?: No Patient able to express need for assistance with ADLs?: No Does the patient have difficulty dressing or bathing?: No Independently performs ADLs?: Yes (appropriate for developmental age) Does the patient have difficulty walking or climbing stairs?: Yes Weakness of Legs: Left Weakness of Arms/Hands: None  Permission Sought/Granted   Permission granted to share information with : No              Emotional Assessment Appearance:: Appears stated age Attitude/Demeanor/Rapport: Gracious Affect (typically observed): Pleasant Orientation: : Oriented to Self, Oriented to Place, Oriented to  Time, Oriented to Situation Alcohol / Substance Use: Not Applicable Psych Involvement: No (comment)  Admission diagnosis:  Hallucinations [R44.3] Hyperkalemia [E87.5] General weakness [R53.1] History of hepatitis C [Z86.19] Elevated LFTs [R79.89] AKI (acute kidney injury) ([place]) [N17.9] Ascites [O17.5] Alcoholic cirrhosis, unspecified whether ascites present ([place]) [K70.30] Patient Active Problem List   Diagnosis Date Noted  . AKI (acute kidney injury) ([place]) 04/02/2020  . Hyperkalemia 04/02/2020  . Hallucinations 04/02/2020  .  Anemia of chronic disease 04/02/2020  . Chronic hepatitis C without hepatic coma ([place]) 08/09/2019  . Ascites due to alcoholic cirrhosis ([place])   . SOB (shortness of breath)   . Decompensated hepatic cirrhosis ([place]) 07/15/2019  . Essential hypertension 06/23/2016  . Alcohol abuse  06/23/2016  . Chronic left hip pain 06/23/2016  . Hyponatremia 06/23/2016  . Hyperlipidemia   . Blind right eye    PCP:  Jenny Reichmann, PA-C Pharmacy:   [facility], [place] [address] Phone: [phone] Fax: [phone]     Social Determinants of Health (SDOH) Interventions    Readmission Risk Interventions No flowsheet data found.

## 2020-04-06 ENCOUNTER — Inpatient Hospital Stay (HOSPITAL_COMMUNITY): Payer: Medicare Other

## 2020-04-06 DIAGNOSIS — F1010 Alcohol abuse, uncomplicated: Secondary | ICD-10-CM | POA: Diagnosis not present

## 2020-04-06 DIAGNOSIS — N179 Acute kidney failure, unspecified: Secondary | ICD-10-CM | POA: Diagnosis not present

## 2020-04-06 DIAGNOSIS — K7030 Alcoholic cirrhosis of liver without ascites: Secondary | ICD-10-CM | POA: Diagnosis not present

## 2020-04-06 LAB — PROCALCITONIN: Procalcitonin: 0.13 ng/mL

## 2020-04-06 LAB — COMPREHENSIVE METABOLIC PANEL WITH GFR
ALT: 65 U/L — ABNORMAL HIGH (ref 0–44)
AST: 131 U/L — ABNORMAL HIGH (ref 15–41)
Albumin: 2.2 g/dL — ABNORMAL LOW (ref 3.5–5.0)
Alkaline Phosphatase: 87 U/L (ref 38–126)
Anion gap: 7 (ref 5–15)
BUN: 11 mg/dL (ref 6–20)
CO2: 21 mmol/L — ABNORMAL LOW (ref 22–32)
Calcium: 9.7 mg/dL (ref 8.9–10.3)
Chloride: 107 mmol/L (ref 98–111)
Creatinine, Ser: 1.39 mg/dL — ABNORMAL HIGH (ref 0.61–1.24)
GFR calc Af Amer: >60 mL/min
GFR calc non Af Amer: 55 mL/min — ABNORMAL LOW
Glucose, Bld: 113 mg/dL — ABNORMAL HIGH (ref 70–99)
Potassium: 5.7 mmol/L — ABNORMAL HIGH (ref 3.5–5.1)
Sodium: 135 mmol/L (ref 135–145)
Total Bilirubin: 1.2 mg/dL (ref 0.3–1.2)
Total Protein: 6.8 g/dL (ref 6.5–8.1)

## 2020-04-06 LAB — CBC
HCT: 30.1 % — ABNORMAL LOW (ref 39.0–52.0)
Hemoglobin: 10.2 g/dL — ABNORMAL LOW (ref 13.0–17.0)
MCH: 34.6 pg — ABNORMAL HIGH (ref 26.0–34.0)
MCHC: 33.9 g/dL (ref 30.0–36.0)
MCV: 102 fL — ABNORMAL HIGH (ref 80.0–100.0)
Platelets: 168 10*3/uL (ref 150–400)
RBC: 2.95 MIL/uL — ABNORMAL LOW (ref 4.22–5.81)
RDW: 13.7 % (ref 11.5–15.5)
WBC: 14.3 10*3/uL — ABNORMAL HIGH (ref 4.0–10.5)
nRBC: 1 % — ABNORMAL HIGH (ref 0.0–0.2)

## 2020-04-06 LAB — AMMONIA: Ammonia: 24 umol/L (ref 9–35)

## 2020-04-06 LAB — PROTIME-INR
INR: 1.6 — ABNORMAL HIGH (ref 0.8–1.2)
Prothrombin Time: 18.8 s — ABNORMAL HIGH (ref 11.4–15.2)

## 2020-04-06 MED ORDER — SODIUM ZIRCONIUM CYCLOSILICATE 10 G PO PACK
10.0000 g | PACK | Freq: Two times a day (BID) | ORAL | Status: AC
  Administered 2020-04-06 (×2): 10 g via ORAL
  Filled 2020-04-06 (×2): qty 1

## 2020-04-06 MED ORDER — COLCHICINE 0.6 MG PO TABS
0.6000 mg | ORAL_TABLET | Freq: Two times a day (BID) | ORAL | Status: AC
  Administered 2020-04-06 (×2): 0.6 mg via ORAL
  Filled 2020-04-06 (×2): qty 1

## 2020-04-06 MED ORDER — LACTULOSE 10 GM/15ML PO SOLN
20.0000 g | Freq: Two times a day (BID) | ORAL | Status: DC
  Administered 2020-04-06 – 2020-04-07 (×3): 20 g via ORAL
  Filled 2020-04-06 (×3): qty 30

## 2020-04-06 MED ORDER — SODIUM CHLORIDE 0.9 % IV SOLN: INTRAVENOUS | Status: AC

## 2020-04-06 NOTE — Progress Notes (Addendum)
PROGRESS NOTE  Calvin [name] DOB: Oct 10, 1960 DOA: 04/02/2020 PCP: Calvin Reichmann, PA-C  HPI/brief narrative HPI: Calvin [name] is a 59 y.o. male with PMH of alcoholic cirrhosis, Hep C s/p treatment, alcohol abuse, HLD, HTN and gout who presented to ED with hallucinations and admitted for AKI, hyperkalemia and acute on chronic liver injury.  History provided by patient and his wife. Wife reports she started noticing confusion and hallucinations yesterday which have persisted today. Reports he was seeing other people in the room and talking to them and at times has been unsure about where he is; denies this happening previously. Patient reports he did not take Norvasc for a few days but has otherwise been taking other medications, he thinks as prescribed. Reports also taking Oxycodone for chronic hip and knee pain. Continues to drink at least two beers daily and last drank this afternoon around 1400. Wife reports that he was having trouble walking yesterday and with the confusion, she thought he was having a stroke. Patient reports his knees were bothering him and that is why he couldn't walk. Wife also reports that he seemed to have abdominal pain yesterday but patient denies now or currently. Denies headache, dizziness, fever, chills, cough, SOB, chest pain, abdominal pain, nausea, vomiting, diarrhea, constipation, dysuria, hematuria, hematochezia, melena, speech difficulty, trouble eating or any other complaints.  In the ED: Mild tachycardia and borderline soft BP's otherwise stable on room air. Labs remarkable for Na 132, K 6.0, CO2 15, Cr 2.88, AST 716, ALT 112, TSB 1.9, WBC 12.6, Hgb 10.0, Alcohol level < 10. Ammonia and INR pending.  CT Head: non-acute  Patient was given Calcium gluconate, Insulin and D50, Lokelma. Admission requested for AKI and hyperkalemia.  04/03/20: Patient was seen and examined at his bedside in the ED.  He reports hurting all over.  He is alert  and oriented x3.  Unable to obtain paracentesis, by interventional radiology, no fluid that could be safely accessed.  Subjective  Patient had bowel movement x2 yesterday, still complaining of right ankle pain .  Assessment/Plan: Principal Problem:   AKI (acute kidney injury) ([place]) Active Problems:   Essential hypertension   Alcohol abuse   Chronic left hip pain   Hyponatremia   Hyperlipidemia   Decompensated hepatic cirrhosis (HCC)   Ascites due to alcoholic cirrhosis (HCC)   Chronic hepatitis C without hepatic coma (HCC)   Hyperkalemia   Hallucinations   Anemia of chronic disease  Acute toxic metabolic encephalopathy - likely multifactorial secondary to hyperammonemia in the setting of cirrhosis, electrolyte derangement, polypharmacy. -Mentation appears to be improving, did have some intermittent confusion, with tachycardia, most likely due to mild alcohol withdrawal .  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  Hypoalbuminemia in the setting of chronic liver disease Counseled on the importance of alcohol cessation in the setting of chronic liver disease,  Avoid hepatotoxic agents  Resolved hyperkalemia, post treatment Presented with potassium level of 6.0, has normalized after treatment, it is elevated again today at 5.7, will give Lokelma, I have stopped his Aldactone as well.  treated Acute transaminitis in the setting of ongoing alcohol use and alcoholic liver disease - AST>ALT which is typical for alcoholic liver disease. Elevated LFT on presentation in the setting of cirrhosis, treated chronic hepatitis C Continue  to trend level and repeat CMP in the morning Avoid hepatotoxic agents No significant ascites for paracentesis on 04/03/2020 -Events of coagulopathy  with INR of 2.1, he received vitamin K , INR has improved to 1.6, will give another 5 of p.o. vitamin K today. -He is started on low-dose propranolol, especially he is with some tachycardia. -He is with leukocytosis, nontoxic-appearing, I will check procalcitonin.  Hyperammonemia -It is improving, it is 24 today, I have decreased his lactulose to 20 g twice daily .  Alcohol use disorder CIWA protocol in place, he did require some Ativan yesterday given some confusion, and tachycardia this confusion has resolved, he remains tachycardic so he was started on low-dose propranolol as well. TOC consulted to assist with resources for alcohol cessation, not receptive Continue multivitamin, thiamine and folic acid supplements  Polysubstance abuse including tobacco, alcohol and marijuana Per above, kindly see TOC note  Anemia of chronic disease in the setting of cirrhosis -Appears stable, continue to monitor H&H closely  Right ankle pain -Check x-ray, will give colchicine x2. -Edema bilaterally, will check venous Doppler to rule out DVT.  Code Status: Full DVT Prophylaxis:  SCDs(no chemical prophylaxis in the setting of coagulopathy with INR 2.1) Family Communication:  None at bedside    Consultants:  Interventional radiology  Procedures:  None  Antimicrobials:  None  Status is: Inpatient   Dispo:  Patient From: Home  Planned Disposition: SNF  Expected discharge date: in 1 to 2 days  Medically stable for discharge: No, remains tachycardic with hyperammonemia   Objective: Vitals:   04/05/20 0851 04/05/20 1157 04/05/20 2253 04/06/20 0813  BP: 119/81 113/73 114/77 111/69  Pulse: (!) 113 (!) 105 94 79  Resp: 15   18  Temp: 98.1 F (36.7 C)  98 F (36.7 C) 98.1 F (36.7 C)  TempSrc:   Oral   SpO2: 98% 97% 98% 98%  Weight:      Height:       No intake or output data in the 24 hours ending 04/06/20 1336 Filed Weights   04/04/20 0500 04/04/20 1037  Weight: 83.5 kg  83.5 kg    Exam:  Awake Alert, Oriented X 3, No new F.N deficits, Normal affect Symmetrical Chest wall movement, Good air movement bilaterally, CTAB RRR,No Gallops,Rubs or new Murmurs, No Parasternal Heave +ve B.Sounds, Abd Soft, No tenderness, No rebound - guarding or rigidity. No Cyanosis, Clubbing .  1 edema in left lower extremity, trace edema right lower extremity, right ankle tenderness to palpation, but no swelling or  erythema .    Data Reviewed: CBC: Recent Labs  Lab 04/02/20 1353 04/03/20 0630 04/04/20 0416 04/05/20 0347 04/06/20 0300  WBC 12.6* 9.8 9.6 11.3* 14.3*  HGB 10.0* 10.2* 10.1* 9.6* 10.2*  HCT 29.4* 28.9* 30.1* 28.4* 30.1*  MCV 98.7 97.6 100.0 99.3 102.0*  PLT 201 193 176 157 998   Basic Metabolic Panel: Recent Labs  Lab 04/02/20 1353 04/03/20 0630 04/04/20 0416 04/05/20 0347 04/06/20 0300  NA 132* 136 140 137 135  K 6.0* 4.2 3.9 4.2 5.7*  CL 102 105 109 108 107  CO2 15* 19* 19* 20* 21*  GLUCOSE 83 102* 129* 108* 113*  BUN 20 26* 14 9 11   CREATININE 2.88* 1.75* 1.23 1.11 1.39*  CALCIUM 10.3 9.8 9.9 9.7 9.7  MG  --  2.0  --   --   --   PHOS  --  3.5  --   --   --  GFR: Estimated Creatinine Clearance: 58.4 mL/min (A) (by C-G formula based on SCr of 1.39 mg/dL (H)). Liver Function Tests: Recent Labs  Lab 04/02/20 1353 04/03/20 0630 04/04/20 0416 04/05/20 0347 04/06/20 0300  AST 716* 564* 386* 225* 131*  ALT 112* 101* 96* 81* 65*  ALKPHOS 103 95 92 86 87  BILITOT 1.9* 1.7* 0.9 1.2 1.2  PROT 7.4 6.7 6.7 6.6 6.8  ALBUMIN 2.8* 2.4* 2.4* 2.3* 2.2*   No results for input(s): LIPASE, AMYLASE in the last 168 hours. Recent Labs  Lab 04/02/20 2238 04/03/20 0734 04/04/20 0416 04/05/20 0347 04/06/20 0302  AMMONIA 102* 62* 68* 47* 24   Coagulation Profile: Recent Labs  Lab 04/03/20 0019 04/04/20 0416 04/05/20 0347 04/06/20 0300  INR 1.9* 2.1* 1.6* 1.6*   Cardiac Enzymes: No results for input(s): CKTOTAL, CKMB, CKMBINDEX,  TROPONINI in the last 168 hours. BNP (last 3 results) No results for input(s): PROBNP in the last 8760 hours. HbA1C: No results for input(s): HGBA1C in the last 72 hours. CBG: Recent Labs  Lab 04/02/20 1346  GLUCAP 85   Lipid Profile: No results for input(s): CHOL, HDL, LDLCALC, TRIG, CHOLHDL, LDLDIRECT in the last 72 hours. Thyroid Function Tests: No results for input(s): TSH, T4TOTAL, FREET4, T3FREE, THYROIDAB in the last 72 hours. Anemia Panel: No results for input(s): VITAMINB12, FOLATE, FERRITIN, TIBC, IRON, RETICCTPCT in the last 72 hours. Urine analysis:    Component Value Date/Time   COLORURINE AMBER (A) 07/15/2019 1944   APPEARANCEUR CLEAR 07/15/2019 1944   LABSPEC 1.019 07/15/2019 1944   PHURINE 6.0 07/15/2019 1944   GLUCOSEU NEGATIVE 07/15/2019 1944   HGBUR NEGATIVE 07/15/2019 1944   BILIRUBINUR SMALL (A) 07/15/2019 1944   KETONESUR NEGATIVE 07/15/2019 1944   PROTEINUR NEGATIVE 07/15/2019 1944   UROBILINOGEN 1.0 02/13/2010 1843   NITRITE NEGATIVE 07/15/2019 1944   LEUKOCYTESUR NEGATIVE 07/15/2019 1944   Sepsis Labs: @LABRCNTIP (procalcitonin:4,lacticidven:4)  ) Recent Results (from the past 240 hour(s))  SARS Coronavirus 2 by RT PCR (hospital order, performed in [place] hospital lab) Nasopharyngeal Nasopharyngeal Swab     Status: None   Collection Time: 04/03/20  2:54 AM   Specimen: Nasopharyngeal Swab  Result Value Ref Range Status   SARS Coronavirus 2 NEGATIVE NEGATIVE Final    Comment: (NOTE) SARS-CoV-2 target nucleic acids are NOT DETECTED.  The SARS-CoV-2 RNA is generally detectable in upper and lower respiratory specimens during the acute phase of infection. The lowest concentration of SARS-CoV-2 viral copies this assay can detect is 250 copies / mL. A negative result does not preclude SARS-CoV-2 infection and should not be used as the sole basis for treatment or other patient management decisions.  A negative result may occur with improper  specimen collection / handling, submission of specimen other than nasopharyngeal swab, presence of viral mutation(s) within the areas targeted by this assay, and inadequate number of viral copies (<250 copies / mL). A negative result must be combined with clinical observations, patient history, and epidemiological information.  Fact Sheet for Patients:   StrictlyIdeas.no  Fact Sheet for Healthcare Providers: BankingDealers.co.za  This test is not yet approved or  cleared by the [place] FDA and has been authorized for detection and/or diagnosis of SARS-CoV-2 by FDA under an Emergency Use Authorization (EUA).  This EUA will remain in effect (meaning this test can be used) for the duration of the COVID-19 declaration under Section 564(b)(1) of the Act, 21 U.S.C. section 360bbb-3(b)(1), unless the authorization is terminated or revoked sooner.  Performed at  [facility], [place] [address] [place],  [address]       Studies: No results found.  Scheduled Meds: . colchicine  0.6 mg Oral BID  . folic acid  1 mg Oral Daily  . gabapentin  100 mg Oral BID  . lactulose  20 g Oral BID  . multivitamin with minerals  1 tablet Oral Daily  . nicotine  21 mg Transdermal Daily  . pantoprazole  40 mg Oral Daily  . propranolol  10 mg Oral BID  . sodium zirconium cyclosilicate  10 g Oral BID  . thiamine  100 mg Oral Daily   Or  . thiamine  100 mg Intravenous Daily    Continuous Infusions:   LOS: 4 days     Phillips Climes, MD [facility]  If 7PM-7AM, please contact night-coverage [email or web address] Password [facility] 04/06/2020, 1:36 PM

## 2020-04-06 NOTE — Progress Notes (Signed)
Physical Therapy Treatment Patient Details Name: Calvin [name] MRN: [id number] DOB: 1961-08-16 Today's Date: 04/06/2020    History of Present Illness 59 y.o. male with PMH of alcoholic cirrhosis, Hep C s/p treatment, alcohol abuse, HLD, HTN and gout who presented to ED with hallucinations and admitted for AKI, hyperkalemia and acute on chronic liver injury. +ETOH, marijuana, polypharm with daily use of oxycontin.    PT Comments    Pt still requiring significant physical assist +2 for transfers and hallway ambulation. Pt motivated to progress mobility with PT and OT today, increasing gait distance but very unsteady requiring multimodal cuing for posture, use of RW, and balance. Pt continues to complain of R ankle pain, soft tissue massage (STM) and gentle stretching performed to pt comfort. PT to continue to recommend SNF level of care post-acutely, will continue to follow.    Follow Up Recommendations  SNF;Supervision/Assistance - 24 hour (may progress to HHPT level as withdrawal s/s improve, will need 24/7 if so)     Equipment Recommendations  None recommended by PT    Recommendations for Other Services       Precautions / Restrictions Precautions Precautions: Fall Restrictions Weight Bearing Restrictions: No    Mobility  Bed Mobility Overal bed mobility: Needs Assistance Bed Mobility: Supine to Sit     Supine to sit: Min guard;HOB elevated     General bed mobility comments: for safety, increased time and effort with use of HOB elevation  Transfers Overall transfer level: Needs assistance Equipment used: Rolling walker (2 wheeled) Transfers: Sit to/from Stand Sit to Stand: Max assist;+2 physical assistance;+2 safety/equipment;From elevated surface         General transfer comment: Max +2 for power up, rise, and steady upon standing. Repeated cuing for proper hand placement when rising/sitting from EOB. Heavy posterior leaning upon initial  standing  Ambulation/Gait Ambulation/Gait assistance: Min assist;Mod assist;+2 physical assistance;+2 safety/equipment Gait Distance (Feet): 65 Feet Assistive device: Rolling walker (2 wheeled) Gait Pattern/deviations: Step-through pattern;Decreased stride length;Trunk flexed;Antalgic;Decreased weight shift to right;Decreased dorsiflexion - right Gait velocity: decr   General Gait Details: min assist +2 initially for steadying, correcting posterior leaning; mod +2 in last 15 ft ambulation for physically guiding pt and RW and steadying pt. Multimodal cuing for upright posture throughout gait, with additional cuing for R foot clearance during swing phase.   Stairs             Wheelchair Mobility    Modified Rankin (Stroke Patients Only)       Balance Overall balance assessment: Needs assistance Sitting-balance support: Bilateral upper extremity supported;Feet supported Sitting balance-Leahy Scale: Fair     Standing balance support: Bilateral upper extremity supported;During functional activity Standing balance-Leahy Scale: Poor                              Cognition Arousal/Alertness: Awake/alert Behavior During Therapy: WFL for tasks assessed/performed Overall Cognitive Status: Impaired/Different from baseline Area of Impairment: Safety/judgement;Awareness;Problem solving;Memory;Attention;Following commands                   Current Attention Level: Sustained Memory: Decreased short-term memory Following Commands: Follows one step commands with increased time Safety/Judgement: Decreased awareness of safety;Decreased awareness of deficits Awareness: Emergent Problem Solving: Difficulty sequencing;Requires verbal cues;Requires tactile cues General Comments: requires max multimodal cuing for safety during mobility, lacks insight into severity of deficits      Exercises Other Exercises Other Exercises: ankle circles clockwise and  counterclockwise,  x10, R ankle Other Exercises: long axis traction with emphasis on R ankle, x15 second bouts x3 Other Exercises: STM R ankle    General Comments General comments (skin integrity, edema, etc.): HR <100 bpm during mobility this day      Pertinent Vitals/Pain Pain Assessment: 0-10 Pain Score: 8  Pain Location: R ankle (medial malleolus area, appears muscular) Pain Descriptors / Indicators: Grimacing;Guarding;Sore;Aching Pain Intervention(s): Limited activity within patient's tolerance;Monitored during session;Repositioned;Premedicated before session    Home Living                      Prior Function            PT Goals (current goals can now be found in the care plan section) Acute Rehab PT Goals Patient Stated Goal: get stronger PT Goal Formulation: With patient Time For Goal Achievement: 04/18/20 Potential to Achieve Goals: Good Progress towards PT goals: Progressing toward goals    Frequency    Min 3X/week      PT Plan Current plan remains appropriate    Co-evaluation PT/OT/SLP Co-Evaluation/Treatment: Yes Reason for Co-Treatment: Complexity of the patient's impairments (multi-system involvement);Necessary to address cognition/behavior during functional activity;For patient/therapist safety;To address functional/ADL transfers PT goals addressed during session: Mobility/safety with mobility;Balance;Proper use of DME;Strengthening/ROM        AM-PAC PT "6 Clicks" Mobility   Outcome Measure  Help needed turning from your back to your side while in a flat bed without using bedrails?: A Little Help needed moving from lying on your back to sitting on the side of a flat bed without using bedrails?: A Little Help needed moving to and from a bed to a chair (including a wheelchair)?: A Lot Help needed standing up from a chair using your arms (e.g., wheelchair or bedside chair)?: A Lot Help needed to walk in hospital room?: A Lot Help needed climbing 3-5 steps with  a railing? : Total 6 Click Score: 13    End of Session Equipment Utilized During Treatment: Gait belt Activity Tolerance: Patient limited by fatigue Patient left: in chair;with chair alarm set;with call bell/phone within reach Nurse Communication: Mobility status PT Visit Diagnosis: Unsteadiness on feet (R26.81);History of falling (Z91.81);Muscle weakness (generalized) (M62.81)     Time: 1660-6301 PT Time Calculation (min) (ACUTE ONLY): 27 min  Charges:  $Gait Training: 8-22 mins                     Telisha Zawadzki E, PT [place] Pager [phone]  Office [phone]    Anahy Esh D Glorene Leitzke 04/06/2020, 10:15 AM

## 2020-04-06 NOTE — Progress Notes (Signed)
Occupational Therapy Treatment Patient Details Name: Calvin [name] MRN: [id number] DOB: 1960/12/06 Today's Date: 04/06/2020    History of present illness 59 y.o. male with PMH of alcoholic cirrhosis, Hep C s/p treatment, alcohol abuse, HLD, HTN and gout who presented to ED with hallucinations and admitted for AKI, hyperkalemia and acute on chronic liver injury. +ETOH, marijuana, polypharm with daily use of oxycontin.   OT comments  Pt motivated to work with therapies with continued significant assist required for transfers and mobility using RW. Multimodal cues required throughout for optimal posture, hand placement, and sequencing to decrease fall risk. Pt demonstrated ability to complete LB bathing at sink, but cannot complete this task without physical assist to maintain balance. HR <100bpm today during activity. Continue to recommend SNF for short term rehab to maximize independence and safety.    Follow Up Recommendations  SNF;Supervision/Assistance - 24 hour    Equipment Recommendations  3 in 1 bedside commode;Other (comment);Tub/shower seat (RW)    Recommendations for Other Services      Precautions / Restrictions Precautions Precautions: Fall Restrictions Weight Bearing Restrictions: No       Mobility Bed Mobility Overal bed mobility: Needs Assistance Bed Mobility: Supine to Sit     Supine to sit: Min guard;HOB elevated     General bed mobility comments: for safety, increased time and effort with use of HOB elevation  Transfers Overall transfer level: Needs assistance Equipment used: Rolling walker (2 wheeled) Transfers: Sit to/from Stand Sit to Stand: Max assist;+2 physical assistance;+2 safety/equipment;From elevated surface         General transfer comment: Max +2 for power up, rise, and steady upon standing. Repeated cuing for proper hand placement when rising/sitting from EOB. Heavy posterior leaning upon initial standing    Balance Overall balance  assessment: Needs assistance Sitting-balance support: Bilateral upper extremity supported;Feet supported Sitting balance-Leahy Scale: Fair     Standing balance support: Bilateral upper extremity supported;During functional activity Standing balance-Leahy Scale: Poor Standing balance comment: Pt requires Min to mod A for maintaining standing balance with posterior LOB, increasing unsteadiness as pt fatigues                           ADL either performed or assessed with clinical judgement   ADL Overall ADL's : Needs assistance/impaired             Lower Body Bathing: Minimal assistance;Sit to/from stand;Cueing for safety Lower Body Bathing Details (indicate cue type and reason): Pt able to complete LB bathing for peri care standing at sink, but requires consistently at least Min A to maintain standing balance due to unsteadiness     Lower Body Dressing: Moderate assistance;Sit to/from stand Lower Body Dressing Details (indicate cue type and reason): Pt continues with difficulty in being able to reach feet for adjustment of socks Toilet Transfer: Moderate assistance;+2 for physical assistance;Ambulation;BSC;RW Toilet Transfer Details (indicate cue type and reason): simulated to recliner          Functional mobility during ADLs: Moderate assistance;+2 for physical assistance;Cueing for safety;Cueing for sequencing;Rolling walker General ADL Comments: Pt continues to be limited by decreased LE/core strength, endurance, and dynamic standing balance, as well as decreased awareness of deficits/safety     Vision       Perception     Praxis      Cognition Arousal/Alertness: Awake/alert Behavior During Therapy: WFL for tasks assessed/performed Overall Cognitive Status: Impaired/Different from baseline Area of Impairment: Safety/judgement;Awareness;Problem solving;Memory;Attention;Following  commands                   Current Attention Level: Sustained Memory:  Decreased short-term memory Following Commands: Follows one step commands with increased time Safety/Judgement: Decreased awareness of safety;Decreased awareness of deficits Awareness: Emergent Problem Solving: Difficulty sequencing;Requires verbal cues;Requires tactile cues General Comments: requires max multimodal cuing for safety during mobility, lacks insight into severity of deficits        Exercises Other Exercises Other Exercises: ankle circles clockwise and counterclockwise, x10, R ankle Other Exercises: long axis traction with emphasis on R ankle, x15 second bouts x3 Other Exercises: STM R ankle   Shoulder Instructions       General Comments HR < 100bpm during activity today    Pertinent Vitals/ Pain       Pain Assessment: 0-10 Pain Score: 8  Pain Location: R ankle (medial malleolus area, appears muscular) Pain Descriptors / Indicators: Grimacing;Guarding;Sore;Aching Pain Intervention(s): Premedicated before session;Monitored during session;Limited activity within patient's tolerance  Home Living                                          Prior Functioning/Environment              Frequency  Min 2X/week        Progress Toward Goals  OT Goals(current goals can now be found in the care plan section)  Progress towards OT goals: Progressing toward goals  Acute Rehab OT Goals Patient Stated Goal: get stronger OT Goal Formulation: With patient Time For Goal Achievement: 04/18/20 Potential to Achieve Goals: Good ADL Goals Pt Will Perform Grooming: with supervision;standing Pt Will Perform Lower Body Bathing: with min assist;sit to/from stand Pt Will Perform Lower Body Dressing: with min assist;sit to/from stand Pt Will Transfer to Toilet: with min assist;ambulating;bedside commode Pt Will Perform Toileting - Clothing Manipulation and hygiene: with min assist  Plan Discharge plan remains appropriate    Co-evaluation    PT/OT/SLP  Co-Evaluation/Treatment: Yes Reason for Co-Treatment: Complexity of the patient's impairments (multi-system involvement);Necessary to address cognition/behavior during functional activity;For patient/therapist safety;To address functional/ADL transfers PT goals addressed during session: Mobility/safety with mobility;Balance;Proper use of DME;Strengthening/ROM OT goals addressed during session: ADL's and self-care      AM-PAC OT "6 Clicks" Daily Activity     Outcome Measure   Help from another person eating meals?: None Help from another person taking care of personal grooming?: A Little Help from another person toileting, which includes using toliet, bedpan, or urinal?: A Lot Help from another person bathing (including washing, rinsing, drying)?: A Lot Help from another person to put on and taking off regular upper body clothing?: A Little Help from another person to put on and taking off regular lower body clothing?: A Lot 6 Click Score: 16    End of Session Equipment Utilized During Treatment: Gait belt;Rolling walker  OT Visit Diagnosis: Unsteadiness on feet (R26.81);Other abnormalities of gait and mobility (R26.89);Muscle weakness (generalized) (M62.81);History of falling (Z91.81)   Activity Tolerance Patient tolerated treatment well;Patient limited by fatigue   Patient Left in chair;with call bell/phone within reach;Other (comment) (with PT)   Nurse Communication Mobility status        Time: 7035-0093 OT Time Calculation (min): 24 min  Charges: OT General Charges $OT Visit: 1 Visit OT Treatments $Self Care/Home Management : 8-22 mins  Layla Maw, OTR/L   Almyra Free  Estephania Licciardi 04/06/2020, 10:55 AM

## 2020-04-06 NOTE — NC FL2 (Signed)
[place] MEDICAID FL2 LEVEL OF CARE SCREENING TOOL     IDENTIFICATION  Patient Name: Calvin [name] Birthdate: 1960/09/28 Sex: male Admission Date (Current Location): 04/02/2020  [facility] and [place] Number:  Herbalist and Address:  [facility] [place], [place] [address] [place], [place] [address]      Provider Number: [id number]  Attending Physician Name and Address:  Elgergawy, Silver Huguenin, MD  Relative Name and Phone Number:  Jamen Loiseau    Current Level of Care: Hospital Recommended Level of Care: [place] Prior Approval Number:    Date Approved/Denied:   PASRR Number: [id number]  Discharge Plan: SNF    Current Diagnoses: Patient Active Problem List   Diagnosis Date Noted  . AKI (acute kidney injury) ([place]) 04/02/2020  . Hyperkalemia 04/02/2020  . Hallucinations 04/02/2020  . Anemia of chronic disease 04/02/2020  . Chronic hepatitis C without hepatic coma ([place]) 08/09/2019  . Ascites due to alcoholic cirrhosis ([place])   . SOB (shortness of breath)   . Decompensated hepatic cirrhosis ([place]) 07/15/2019  . Essential hypertension 06/23/2016  . Alcohol abuse 06/23/2016  . Chronic left hip pain 06/23/2016  . Hyponatremia 06/23/2016  . Hyperlipidemia   . Blind right eye     Orientation RESPIRATION BLADDER Height & Weight     Self, Time, Situation, Place  Normal Continent Weight: 83.5 kg Height:  5\' 7"  (170.2 cm)  BEHAVIORAL SYMPTOMS/MOOD NEUROLOGICAL BOWEL NUTRITION STATUS   (none)  (none) Continent Diet (2 gram sodium)  AMBULATORY STATUS COMMUNICATION OF NEEDS Skin   Limited Assist Verbally Normal                       Personal Care Assistance Level of Assistance  Bathing, Feeding, Dressing, Total care Bathing Assistance: Limited assistance Feeding assistance: Independent Dressing Assistance: Limited assistance Total Care Assistance: Limited assistance   Functional Limitations Info  Sight, Hearing, Speech  Sight Info: Adequate Hearing Info: Adequate Speech Info: Adequate    SPECIAL CARE FACTORS FREQUENCY  PT (By licensed PT), OT (By licensed OT)     PT Frequency: 5X OT Frequency: 5X            Contractures Contractures Info: Not present    Additional Factors Info  Code Status, Allergies, Psychotropic, Insulin Sliding Scale, Isolation Precautions, Suctioning Needs Code Status Info: Full Allergies Info: NKDA Psychotropic Info: None Insulin Sliding Scale Info: None Isolation Precautions Info: None Suctioning Needs: N/A   Current Medications (04/06/2020):  This is the current hospital active medication list Current Facility-Administered Medications  Medication Dose Route Frequency Provider Last Rate Last Admin  . 0.9 %  sodium chloride infusion   Intravenous Continuous Elgergawy, Silver Huguenin, MD 100 mL/hr at 04/06/20 0824 New Bag at 04/06/20 0824  . folic acid (FOLVITE) tablet 1 mg  1 mg Oral Daily Fair, Marin Shutter, MD   1 mg at 04/06/20 0817  . gabapentin (NEURONTIN) capsule 100 mg  100 mg Oral BID Irene Pap N, DO   100 mg at 04/06/20 0816  . lactulose (CHRONULAC) 10 GM/15ML solution 20 g  20 g Oral BID Elgergawy, Silver Huguenin, MD   20 g at 04/06/20 0816  . multivitamin with minerals tablet 1 tablet  1 tablet Oral Daily Chauncey Mann, MD   1 tablet at 04/06/20 0817  . nicotine (NICODERM CQ - dosed in mg/24 hours) patch 21 mg  21 mg Transdermal Daily Irene Pap N, DO   21  mg at 04/06/20 0826  . pantoprazole (PROTONIX) EC tablet 40 mg  40 mg Oral Daily Elgergawy, Silver Huguenin, MD   40 mg at 04/06/20 0815  . propranolol (INDERAL) tablet 10 mg  10 mg Oral BID Elgergawy, Silver Huguenin, MD   10 mg at 04/06/20 3338  . sodium zirconium cyclosilicate (LOKELMA) packet 10 g  10 g Oral BID Elgergawy, Silver Huguenin, MD   10 g at 04/06/20 0815  . thiamine tablet 100 mg  100 mg Oral Daily Fair, Marin Shutter, MD   100 mg at 04/05/20 3291   Or  . thiamine (B-1) injection 100 mg  100 mg Intravenous Daily Chauncey Mann, MD   100 mg at 04/06/20 0816  . tiZANidine (ZANAFLEX) tablet 4 mg  4 mg Oral TID PRN Irene Pap N, DO   4 mg at 04/06/20 0815  . traMADol (ULTRAM) tablet 50 mg  50 mg Oral Q6H PRN Elgergawy, Silver Huguenin, MD   50 mg at 04/06/20 9166     Discharge Medications: Please see discharge summary for a list of discharge medications.  Relevant Imaging Results:  Relevant Lab Results:   Additional Information SS# [id number]  Angelita Ingles, RN

## 2020-04-06 NOTE — TOC Progression Note (Signed)
Transition of Care [facility]) - Progression Note    Patient Details  Name: Calvin [name] MRN: [id number] Date of Birth: 1961/03/14  Transition of Care [facility]) CM/SW Contact  Angelita Ingles, RN Phone Number: [phone]  04/06/2020, 1:13 PM  Clinical Narrative: FL2 completed and cosigned. PASRR complete [id number]. Insurance authorization initiated. Patients wife would like to [employer]. Bed offer accepted via [place]. CM has sent message to Juliann Pulse at [facility] to make her aware that patient would like to come there but will not be medically ready for 1-2 more days per MD.        Expected Discharge Plan: [place] Barriers to Discharge: Continued Medical Work up  Expected Discharge Plan and Services Expected Discharge Plan: [place] In-house Referral: NA Discharge Planning Services: CM Consult Post Acute Care Choice: [place] Living arrangements for the past 2 months: Single Family Home                 DME Arranged: N/A DME Agency: NA       HH Arranged: NA HH Agency: NA         Social Determinants of Health (SDOH) Interventions    Readmission Risk Interventions No flowsheet data found.

## 2020-04-07 ENCOUNTER — Inpatient Hospital Stay (HOSPITAL_COMMUNITY): Payer: Medicare Other

## 2020-04-07 DIAGNOSIS — F1010 Alcohol abuse, uncomplicated: Secondary | ICD-10-CM | POA: Diagnosis not present

## 2020-04-07 DIAGNOSIS — R609 Edema, unspecified: Secondary | ICD-10-CM

## 2020-04-07 DIAGNOSIS — N179 Acute kidney failure, unspecified: Secondary | ICD-10-CM | POA: Diagnosis not present

## 2020-04-07 DIAGNOSIS — K7030 Alcoholic cirrhosis of liver without ascites: Secondary | ICD-10-CM | POA: Diagnosis not present

## 2020-04-07 LAB — COMPREHENSIVE METABOLIC PANEL
ALT: 52 U/L — ABNORMAL HIGH (ref 0–44)
AST: 84 U/L — ABNORMAL HIGH (ref 15–41)
Albumin: 2.1 g/dL — ABNORMAL LOW (ref 3.5–5.0)
Alkaline Phosphatase: 87 U/L (ref 38–126)
Anion gap: 9 (ref 5–15)
BUN: 15 mg/dL (ref 6–20)
CO2: 18 mmol/L — ABNORMAL LOW (ref 22–32)
Calcium: 9 mg/dL (ref 8.9–10.3)
Chloride: 106 mmol/L (ref 98–111)
Creatinine, Ser: 1.21 mg/dL (ref 0.61–1.24)
GFR calc Af Amer: >60 mL/min (ref >60)
GFR calc non Af Amer: >60 mL/min (ref >60)
Glucose, Bld: 128 mg/dL — ABNORMAL HIGH (ref 70–99)
Potassium: 3.9 mmol/L (ref 3.5–5.1)
Sodium: 133 mmol/L — ABNORMAL LOW (ref 135–145)
Total Bilirubin: 0.9 mg/dL (ref 0.3–1.2)
Total Protein: 6.3 g/dL — ABNORMAL LOW (ref 6.5–8.1)

## 2020-04-07 LAB — CBC
HCT: 28.4 % — ABNORMAL LOW (ref 39.0–52.0)
Hemoglobin: 9.4 g/dL — ABNORMAL LOW (ref 13.0–17.0)
MCH: 33.7 pg (ref 26.0–34.0)
MCHC: 33.1 g/dL (ref 30.0–36.0)
MCV: 101.8 fL — ABNORMAL HIGH (ref 80.0–100.0)
Platelets: 152 10*3/uL (ref 150–400)
RBC: 2.79 MIL/uL — ABNORMAL LOW (ref 4.22–5.81)
RDW: 13.2 % (ref 11.5–15.5)
WBC: 11.7 10*3/uL — ABNORMAL HIGH (ref 4.0–10.5)
nRBC: 0.5 % — ABNORMAL HIGH (ref 0.0–0.2)

## 2020-04-07 LAB — PROTIME-INR
INR: 1.7 — ABNORMAL HIGH (ref 0.8–1.2)
Prothrombin Time: 18.9 seconds — ABNORMAL HIGH (ref 11.4–15.2)

## 2020-04-07 LAB — AMMONIA: Ammonia: 51 umol/L — ABNORMAL HIGH (ref 9–35)

## 2020-04-07 LAB — PROCALCITONIN: Procalcitonin: 0.11 ng/mL

## 2020-04-07 LAB — SARS CORONAVIRUS 2 BY RT PCR (HOSPITAL ORDER, PERFORMED IN ~~LOC~~ HOSPITAL LAB): SARS Coronavirus 2: NEGATIVE

## 2020-04-07 MED ORDER — THIAMINE HCL 100 MG PO TABS: 100.0000 mg | ORAL_TABLET | Freq: Every day | ORAL | Status: AC

## 2020-04-07 MED ORDER — PROPRANOLOL HCL 10 MG PO TABS: 10.0000 mg | ORAL_TABLET | Freq: Two times a day (BID) | ORAL | Status: DC

## 2020-04-07 MED ORDER — NICOTINE 21 MG/24HR TD PT24: 21.0000 mg | MEDICATED_PATCH | Freq: Every day | TRANSDERMAL | 0 refills | Status: DC

## 2020-04-07 MED ORDER — HEPARIN SODIUM (PORCINE) 5000 UNIT/ML IJ SOLN
5000.0000 [IU] | Freq: Two times a day (BID) | INTRAMUSCULAR | Status: DC
  Administered 2020-04-07: 5000 [IU] via SUBCUTANEOUS
  Filled 2020-04-07: qty 1

## 2020-04-07 MED ORDER — LACTULOSE 10 GM/15ML PO SOLN: 20.0000 g | Freq: Two times a day (BID) | ORAL | 0 refills | Status: AC

## 2020-04-07 MED ORDER — COLCHICINE 0.6 MG PO TABS
0.6000 mg | ORAL_TABLET | Freq: Every day | ORAL | Status: DC
Start: 2020-04-07 — End: 2020-07-27

## 2020-04-07 NOTE — TOC Progression Note (Signed)
Transition of Care [facility]) - Progression Note    Patient Details  Name: Calvin [name] MRN: [id number] Date of Birth: Feb 19, 1961  Transition of Care [facility]) CM/SW [place], [place] Phone Number: 04/07/2020, 9:46 AM  Clinical Narrative:    CSW contacted [place] to receive update date on insurance auth for SNF.  [employer] has received insurance auth for pt.  Pt can be discharge to facility when pt is medically stable for discharge.  TOC Team will continue to follow for disposition planning.   Expected Discharge Plan: [place] Barriers to Discharge: Continued Medical Work up  Expected Discharge Plan and Services Expected Discharge Plan: [place] In-house Referral: NA Discharge Planning Services: CM Consult Post Acute Care Choice: [place] Living arrangements for the past 2 months: Single Family Home                 DME Arranged: N/A DME Agency: NA       HH Arranged: NA HH Agency: NA         Social Determinants of Health (SDOH) Interventions    Readmission Risk Interventions No flowsheet data found.

## 2020-04-07 NOTE — Progress Notes (Signed)
Lower extremity venous bilateral study completed.   See Cv Proc for preliminary results.   Gisela Lea  

## 2020-04-07 NOTE — Discharge Summary (Signed)
Calvin [name], is a 59 y.o. male  DOB 1960/10/10  MRN [id number].  Admission date:  04/02/2020  Admitting Physician  Chauncey Mann, MD  Discharge Date:  04/07/2020   Primary MD  Jenny Reichmann, PA-C  Recommendations for primary care physician for things to follow:  -Recheck CBC, INR, CMP in 3 days. -Continue encouragement about alcohol abstinence,   Admission Diagnosis  Hallucinations [R44.3] Hyperkalemia [E87.5] General weakness [R53.1] History of hepatitis C [Z86.19] Elevated LFTs [R79.89] AKI (acute kidney injury) ([place]) [N17.9] Ascites [W09.8] Alcoholic cirrhosis, unspecified whether ascites present ([place]) [K70.30]   Discharge Diagnosis  Hallucinations [R44.3] Hyperkalemia [E87.5] General weakness [R53.1] History of hepatitis C [Z86.19] Elevated LFTs [R79.89] AKI (acute kidney injury) ([place]) [N17.9] Ascites [J19.1] Alcoholic cirrhosis, unspecified whether ascites present ([place]) [K70.30]    Principal Problem:   AKI (acute kidney injury) ([place]) Active Problems:   Essential hypertension   Alcohol abuse   Chronic left hip pain   Hyponatremia   Hyperlipidemia   Decompensated hepatic cirrhosis (HCC)   Ascites due to alcoholic cirrhosis ([place])   Chronic hepatitis C without hepatic coma (HCC)   Hyperkalemia   Hallucinations   Anemia of chronic disease      Past Medical History:  Diagnosis Date  . Ascites   . Chronic left hip pain   . Cirrhosis ([place])   . Hypertension   . TIA (transient ischemic attack) 06/23/2016    Past Surgical History:  Procedure Laterality Date  . BACK SURGERY    . CERVICAL SPINE SURGERY    . JOINT REPLACEMENT         History of present illness and  Hospital Course:     Kindly see H&P for history of present illness and admission details, please review complete Labs, Consult reports and Test reports for all details in brief  HPI  from the history  and physical done on the day of admission 04/02/2020  HPI: Calvin [name] is a 59 y.o. male with PMH of alcoholic cirrhosis, Hep C s/p treatment, alcohol abuse, HLD, HTN and gout who presented to ED with hallucinations and admitted for AKI, hyperkalemia and acute on chronic liver injury.  History provided by patient and his wife. Wife reports she started noticing confusion and hallucinations yesterday which have persisted today. Reports he was seeing other people in the room and talking to them and at times has been unsure about where he is; denies this happening previously. Patient reports he did not take Norvasc for a few days but has otherwise been taking other medications, he thinks as prescribed. Reports also taking Oxycodone for chronic hip and knee pain. Continues to drink at least two beers daily and last drank this afternoon around 1400. Wife reports that he was having trouble walking yesterday and with the confusion, she thought he was having a stroke. Patient reports his knees were bothering him and that is why he couldn't walk. Wife also reports that he seemed to have abdominal pain yesterday but patient denies now or  currently. Denies headache, dizziness, fever, chills, cough, SOB, chest pain, abdominal pain, nausea, vomiting, diarrhea, constipation, dysuria, hematuria, hematochezia, melena, speech difficulty, trouble eating or any other complaints.  In the ED: Mild tachycardia and borderline soft BP's otherwise stable on room air. Labs remarkable for Na 132, K 6.0, CO2 15, Cr 2.88, AST 716, ALT 112, TSB 1.9, WBC 12.6, Hgb 10.0, Alcohol level < 10. Ammonia and INR pending.  CT Head: non-acute  Patient was given Calcium gluconate, Insulin and D50, Lokelma. Admission requested for AKI and hyperkalemia.   Hospital Course   Acute toxic metabolic encephalopathy - likely multifactorial secondary to hyperammonemia in the setting of cirrhosis, electrolyte derangement,  polypharmacy. -Mentation appears to be improving, did have some intermittent confusion, with tachycardia, most likely due to mild alcohol withdrawal during hospital stay . -This all has resolved, and mentation back to baseline.  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  Hypoalbuminemia in the setting of chronic liver disease Counseled on the importance of alcohol cessation in the setting of chronic liver disease,  Avoid hepatotoxic agents  Resolved hyperkalemia, post treatment Presented with potassium level of 6.0, has normalized after treatment, it is elevated again today at 5.7, for which she did receive Lokelma, just 3.9 today, I have stopped his Aldactone on discharge.  Acute transaminitis in the setting of ongoing alcohol use and alcoholic liver disease - AST>ALT which is typical for alcoholic liver disease.  It did significantly improve during hospital stay Elevated LFT on presentation in the setting of cirrhosis, treated chronic hepatitis C Avoid hepatotoxic agents No significant ascites for paracentesis on 04/03/2020 -Events of coagulopathy with INR of 2.1, he received vitamin K multiple times during hospital stay. -He is started on low-dose propranolol, especially he is with some tachycardia. -And had mild leukocytosis at 14, he was afebrile, nontoxic-appearing, this has been trending down, procalcitonin is reassuring  Hyperammonemia -It is improving, it is 24 today, I have decreased his lactulose to 20 g twice daily .  Alcohol use disorder with mild delirium CIWA protocol in place, he did have some mild alcohol withdrawal during hospital stay where he did require as needed Ativan, this all has resolved, no further evidence of withdrawals . Continue  multivitamin, thiamine and folic acid supplements  Polysubstance abuse including tobacco, alcohol and marijuana Per above, kindly see TOC note  Anemia of chronic disease in the setting of cirrhosis -Appears stable,   Right ankle pain due to gout -Ray with no acute findings, this has significantly improved with colchicine, to continue colchicine for another 5 days as an outpatient -Venous Doppler has been obtained for trace edema in lower extremities, it is negative for DVT.     Discharge Condition:  stable   Follow UP   Contact information for after-discharge care    Destination    [facility] Preferred SNF .   Service: Skilled Nursing Contact information: [address] [place] [address] [phone]                    Discharge Instructions  and  Discharge Medications     Discharge Instructions    Discharge instructions   Complete by: As directed    Follow with Primary MD Jenny Reichmann, PA-C after discharge from SNF  Get CBC, CMP,checked  by Primary MD next visit.    Activity: As tolerated with Full fall precautions use walker/cane & assistance as needed   Disposition SNF   Diet: Heart Healthy /low salt , with feeding assistance and aspiration precautions.  On your next visit with your primary care physician please Get Medicines reviewed and adjusted.   Please request your Prim.MD to go over all Hospital Tests and Procedure/Radiological results at the follow up, please get all Hospital records sent to your Prim MD by signing hospital release before you go home.   If you experience worsening of your admission symptoms, develop shortness of breath, life threatening emergency, suicidal or homicidal thoughts you must seek medical attention immediately by calling 911 or calling your MD immediately  if symptoms less severe.  You Must read complete instructions/literature along with all the possible adverse  reactions/side effects for all the Medicines you take and that have been prescribed to you. Take any new Medicines after you have completely understood and accpet all the possible adverse reactions/side effects.   Do not drive, operating heavy machinery, perform activities at heights, swimming or participation in water activities or provide baby sitting services if your were admitted for syncope or siezures until you have seen by Primary MD or a Neurologist and advised to do so again.  Do not drive when taking Pain medications.    Do not take more than prescribed Pain, Sleep and Anxiety Medications  Special Instructions: If you have smoked or chewed Tobacco  in the last 2 yrs please stop smoking, stop any regular Alcohol  and or any Recreational drug use.  Wear Seat belts while driving.   Please note  You were cared for by a hospitalist during your hospital stay. If you have any questions about your discharge medications or the care you received while you were in the hospital after you are discharged, you can call the unit and asked to speak with the hospitalist on call if the hospitalist that took care of you is not available. Once you are discharged, your primary care physician will handle any further medical issues. Please note that NO REFILLS for any discharge medications will be authorized once you are discharged, as it is imperative that you return to your primary care physician (or establish a relationship with a primary care physician if you do not have one) for your aftercare needs so that they can reassess your need for medications and monitor your lab values.   Increase activity slowly   Complete by: As directed      Allergies as of 04/07/2020   No Known Allergies     Medication List    STOP taking these medications   amLODipine 5 MG tablet Commonly known as: NORVASC   prednisoLONE 5 MG Tabs tablet   predniSONE 20 MG tablet Commonly known as: DELTASONE   spironolactone  100 MG tablet Commonly known as: ALDACTONE     TAKE these medications   allopurinol 100 MG tablet Commonly known as: ZYLOPRIM Take 200 mg by mouth daily.   colchicine 0.6 MG tablet Take 1 tablet (0.6 mg total) by mouth daily for 5 days. Take for 5 days and stop   folic acid 1 MG tablet Commonly known as: FOLVITE Take 1 mg by mouth daily.   furosemide 20 MG tablet Commonly known as: LASIX Take 20 mg by mouth daily. What changed: Another medication with the same name was removed. Continue taking this medication, and follow the directions  you see here.   gabapentin 100 MG capsule Commonly known as: NEURONTIN Take 100 mg by mouth in the morning, at noon, and at bedtime.   lactulose 10 GM/15ML solution Commonly known as: CHRONULAC Take 30 mLs (20 g total) by mouth 2 (two) times daily. What changed: how much to take   nicotine 21 mg/24hr patch Commonly known as: NICODERM CQ - dosed in mg/24 hours Place 1 patch (21 mg total) onto the skin daily. Start taking on: April 08, 2020   pantoprazole 40 MG tablet Commonly known as: PROTONIX Take 40 mg by mouth daily.   propranolol 10 MG tablet Commonly known as: INDERAL Take 1 tablet (10 mg total) by mouth 2 (two) times daily.   thiamine 100 MG tablet Take 1 tablet (100 mg total) by mouth daily. Start taking on: April 08, 2020   tiZANidine 4 MG tablet Commonly known as: ZANAFLEX Take 4 mg by mouth 3 (three) times daily as needed for muscle spasms.   Vitamin D (Ergocalciferol) 1.25 MG (50000 UNIT) Caps capsule Commonly known as: DRISDOL Take 50,000 Units by mouth every Friday.         Diet and Activity recommendation: See Discharge Instructions above   Consults obtained -  None   Major procedures and Radiology Reports - PLEASE review detailed and final reports for all details, in brief -     DG Ankle 2 Views Right  Result Date: 04/06/2020 CLINICAL DATA:  Right ankle pain without known injury. EXAM: RIGHT ANKLE -  2 VIEW COMPARISON:  September 17, 2007. FINDINGS: There is no evidence of acute fracture, dislocation, or joint effusion. There is no evidence of arthropathy or other focal bone abnormality. Vascular calcifications are noted. IMPRESSION: No acute abnormality is noted in the right ankle. Electronically Signed   By: Marijo Conception M.D.   On: 04/06/2020 17:07   CT HEAD WO CONTRAST  Result Date: 04/02/2020 CLINICAL DATA:  Altered mental status, slurred speech, hallucinations EXAM: CT HEAD WITHOUT CONTRAST TECHNIQUE: Contiguous axial images were obtained from the base of the skull through the vertex without intravenous contrast. COMPARISON:  08/21/2018 FINDINGS: Brain: Normal anatomic configuration. Mild parenchymal volume loss is slightly advanced in relation to expectation for age, but appears stable since prior examination. Mild periventricular white matter changes are again noted, likely reflecting the sequela of small vessel ischemia. No abnormal intra or extra-axial mass lesion or fluid collection. No abnormal mass effect or midline shift. No evidence of acute intracranial hemorrhage or infarct. Ventricular size is normal. Cerebellum unremarkable. Vascular: Unremarkable Skull: Intact Sinuses/Orbits: Chronic fracture of the medial wall of the right orbit. 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 These are both unchanged from prior examination. There is mucosal thickening involving the poorly aerated right frontal sinus, several ethmoid air cells, and the sphenoid sinuses along with small air-fluid levels within the sphenoid sinuses in keeping with mild paranasal sinus disease. This appears new since prior examination. Other: Mastoid air cells and middle ear cavities are clear. IMPRESSION: No evidence of acute intracranial hemorrhage or infarct. Stable senescent changes. Mild paranasal sinus disease, new since prior examination.  Electronically Signed   By: Fidela Salisbury MD   On: 04/02/2020 22:11   IR ABDOMEN US LIMITED  Result Date: 04/03/2020 CLINICAL DATA:  59 year old male referred for possible paracentesis EXAM: LIMITED ABDOMEN ULTRASOUND FOR ASCITES TECHNIQUE: Limited ultrasound survey for ascites was performed in all four abdominal quadrants. COMPARISON:  None. FINDINGS: Scant ascites within the abdomen.  No paracentesis was performed. IMPRESSION: Scant ascites within the abdomen. Electronically Signed   By: Corrie Mckusick D.O.   On: 04/03/2020 11:44   VAS [place] LOWER EXTREMITY VENOUS (DVT)  Result Date: 04/07/2020  Lower Venous DVTStudy Indications: Edema.  Comparison Study: No prior studies. Performing Technologist: Darlin Coco  Examination Guidelines: A complete evaluation includes B-mode imaging, spectral Doppler, color Doppler, and power Doppler as needed of all accessible portions of each vessel. Bilateral testing is considered an integral part of a complete examination. Limited examinations for reoccurring indications may be performed as noted. The reflux portion of the exam is performed with the patient in reverse Trendelenburg.  +---------+---------------+---------+-----------+----------+--------------+ RIGHT    CompressibilityPhasicitySpontaneityPropertiesThrombus Aging +---------+---------------+---------+-----------+----------+--------------+ CFV      Full           Yes      Yes                                 +---------+---------------+---------+-----------+----------+--------------+ SFJ      Full                                                        +---------+---------------+---------+-----------+----------+--------------+ FV Prox  Full                                                        +---------+---------------+---------+-----------+----------+--------------+ FV Mid   Full                                                         +---------+---------------+---------+-----------+----------+--------------+ FV DistalFull                                                        +---------+---------------+---------+-----------+----------+--------------+ PFV      Full                                                        +---------+---------------+---------+-----------+----------+--------------+ POP      Full           Yes      Yes                                 +---------+---------------+---------+-----------+----------+--------------+ PTV      Full                                                        +---------+---------------+---------+-----------+----------+--------------+  PERO     Full                                                        +---------+---------------+---------+-----------+----------+--------------+   +---------+---------------+---------+-----------+----------+--------------+ LEFT     CompressibilityPhasicitySpontaneityPropertiesThrombus Aging +---------+---------------+---------+-----------+----------+--------------+ CFV      Full           Yes      Yes                                 +---------+---------------+---------+-----------+----------+--------------+ SFJ      Full                                                        +---------+---------------+---------+-----------+----------+--------------+ FV Prox  Full                                                        +---------+---------------+---------+-----------+----------+--------------+ FV Mid   Full                                                        +---------+---------------+---------+-----------+----------+--------------+ FV DistalFull                                                        +---------+---------------+---------+-----------+----------+--------------+ PFV      Full                                                         +---------+---------------+---------+-----------+----------+--------------+ POP      Full           Yes      Yes                                 +---------+---------------+---------+-----------+----------+--------------+ PTV      Full                                                        +---------+---------------+---------+-----------+----------+--------------+ PERO     Full                                                        +---------+---------------+---------+-----------+----------+--------------+  Summary: RIGHT: - There is no evidence of deep vein thrombosis in the lower extremity.  LEFT: - There is no evidence of deep vein thrombosis in the lower extremity.  - A 2.3 cm cystic structure is found in the popliteal fossa.  *See table(s) above for measurements and observations. Electronically signed by Ruta Hinds MD on 04/07/2020 at 12:30:12 PM.    Final     Micro Results     Recent Results (from the past 240 hour(s))  SARS Coronavirus 2 by RT PCR (hospital order, performed in [facility] hospital lab) Nasopharyngeal Nasopharyngeal Swab     Status: None   Collection Time: 04/03/20  2:54 AM   Specimen: Nasopharyngeal Swab  Result Value Ref Range Status   SARS Coronavirus 2 NEGATIVE NEGATIVE Final    Comment: (NOTE) SARS-CoV-2 target nucleic acids are NOT DETECTED.  The SARS-CoV-2 RNA is generally detectable in upper and lower respiratory specimens during the acute phase of infection. The lowest concentration of SARS-CoV-2 viral copies this assay can detect is 250 copies / mL. A negative result does not preclude SARS-CoV-2 infection and should not be used as the sole basis for treatment or other patient management decisions.  A negative result may occur with improper specimen collection / handling, submission of specimen other than nasopharyngeal swab, presence of viral mutation(s) within the areas targeted by this assay, and inadequate number of viral  copies (<250 copies / mL). A negative result must be combined with clinical observations, patient history, and epidemiological information.  Fact Sheet for Patients:   StrictlyIdeas.no  Fact Sheet for Healthcare Providers: BankingDealers.co.za  This test is not yet approved or  cleared by the [place] FDA and has been authorized for detection and/or diagnosis of SARS-CoV-2 by FDA under an Emergency Use Authorization (EUA).  This EUA will remain in effect (meaning this test can be used) for the duration of the COVID-19 declaration under Section 564(b)(1) of the Act, 21 U.S.C. section 360bbb-3(b)(1), unless the authorization is terminated or revoked sooner.  Performed at [facility], [place] [address], [place], [place] [address]        Today   Subjective:   Evonte Prestage today has no headache,no chest abdominal pain,no new weakness tingling or numbness, feels much better wants to go home today.  He reports his ankle pain has resolved.  Objective:   Blood pressure 106/71, pulse 81, temperature 98.1 F (36.7 C), resp. rate 18, height 5\' 7"  (1.702 m), weight 83.5 kg, SpO2 97 %.   Intake/Output Summary (Last 24 hours) at 04/07/2020 1332 Last data filed at 04/07/2020 0601 Gross per 24 hour  Intake --  Output 500 ml  Net -500 ml    Exam Awake Alert, Oriented x 3, No new F.N deficits, Normal affect Symmetrical Chest wall movement, Good air movement bilaterally, CTAB RRR,No Gallops,Rubs or new Murmurs, No Parasternal Heave +ve B.Sounds, Abd Soft, Non tender, No organomegaly appriciated, No rebound -guarding or rigidity. No Cyanosis, Clubbing ,trace  edema, No new Rash or bruise  Data Review   CBC w Diff:  Lab Results  Component Value Date   WBC 11.7 (H) 04/07/2020   HGB 9.4 (L) 04/07/2020   HCT 28.4 (L) 04/07/2020   PLT 152 04/07/2020   LYMPHOPCT 28 08/21/2018   MONOPCT 12 08/21/2018   EOSPCT 0 08/21/2018    BASOPCT 1 08/21/2018    CMP:  Lab Results  Component Value Date   NA 133 (L) 04/07/2020   K 3.9 04/07/2020  CL 106 04/07/2020   CO2 18 (L) 04/07/2020   BUN 15 04/07/2020   CREATININE 1.21 04/07/2020   PROT 6.3 (L) 04/07/2020   ALBUMIN 2.1 (L) 04/07/2020   BILITOT 0.9 04/07/2020   ALKPHOS 87 04/07/2020   AST 84 (H) 04/07/2020   ALT 52 (H) 04/07/2020  .   Total Time in preparing paper work, data evaluation and todays exam - 46 minutes  Phillips Climes M.D on 04/07/2020 at 1:32 PM  [facility]   Office  [phone]

## 2020-04-07 NOTE — Discharge Planning (Signed)
ROLLO [name], is a 59 y.o. male  DOB Jul 04, 1961  MRN [id number].  Admission date:  04/02/2020  Admitting Physician  Chauncey Mann, MD  Discharge Date:  04/07/2020   Primary MD  Jenny Reichmann, PA-C  Recommendations for primary care physician for things to follow:  -Recheck CBC, INR, CMP in 3 days. -Continue encouragement about alcohol abstinence,   Admission Diagnosis  Hallucinations [R44.3] Hyperkalemia [E87.5] General weakness [R53.1] History of hepatitis C [Z86.19] Elevated LFTs [R79.89] AKI (acute kidney injury) ([place]) [N17.9] Ascites [Q33.0] Alcoholic cirrhosis, unspecified whether ascites present ([place]) [K70.30]   Discharge Diagnosis  Hallucinations [R44.3] Hyperkalemia [E87.5] General weakness [R53.1] History of hepatitis C [Z86.19] Elevated LFTs [R79.89] AKI (acute kidney injury) ([place]) [N17.9] Ascites [Q76.2] Alcoholic cirrhosis, unspecified whether ascites present ([place]) [K70.30]    Principal Problem:   AKI (acute kidney injury) ([place]) Active Problems:   Essential hypertension   Alcohol abuse   Chronic left hip pain   Hyponatremia   Hyperlipidemia   Decompensated hepatic cirrhosis (HCC)   Ascites due to alcoholic cirrhosis ([place])   Chronic hepatitis C without hepatic coma (HCC)   Hyperkalemia   Hallucinations   Anemia of chronic disease      Past Medical History:  Diagnosis Date  . Ascites   . Chronic left hip pain   . Cirrhosis ([place])   . Hypertension   . TIA (transient ischemic attack) 06/23/2016    Past Surgical History:  Procedure Laterality Date  . BACK SURGERY    . CERVICAL SPINE SURGERY    . JOINT REPLACEMENT         History of present illness and  Hospital Course:     Kindly see H&P for history of present illness and admission details, please review complete Labs, Consult reports and Test reports for all details in brief  HPI  from the history  and physical done on the day of admission 04/02/2020  HPI: Calvin [name] is a 59 y.o. male with PMH of alcoholic cirrhosis, Hep C s/p treatment, alcohol abuse, HLD, HTN and gout who presented to ED with hallucinations and admitted for AKI, hyperkalemia and acute on chronic liver injury.  History provided by patient and his wife. Wife reports she started noticing confusion and hallucinations yesterday which have persisted today. Reports he was seeing other people in the room and talking to them and at times has been unsure about where he is; denies this happening previously. Patient reports he did not take Norvasc for a few days but has otherwise been taking other medications, he thinks as prescribed. Reports also taking Oxycodone for chronic hip and knee pain. Continues to drink at least two beers daily and last drank this afternoon around 1400. Wife reports that he was having trouble walking yesterday and with the confusion, she thought he was having a stroke. Patient reports his knees were bothering him and that is why he couldn't walk. Wife also reports that he seemed to have abdominal pain yesterday but patient denies now or  currently. Denies headache, dizziness, fever, chills, cough, SOB, chest pain, abdominal pain, nausea, vomiting, diarrhea, constipation, dysuria, hematuria, hematochezia, melena, speech difficulty, trouble eating or any other complaints.  In the ED: Mild tachycardia and borderline soft BP's otherwise stable on room air. Labs remarkable for Na 132, K 6.0, CO2 15, Cr 2.88, AST 716, ALT 112, TSB 1.9, WBC 12.6, Hgb 10.0, Alcohol level < 10. Ammonia and INR pending.  CT Head: non-acute  Patient was given Calcium gluconate, Insulin and D50, Lokelma. Admission requested for AKI and hyperkalemia.   Hospital Course   Acute toxic metabolic encephalopathy - likely multifactorial secondary to hyperammonemia in the setting of cirrhosis, electrolyte derangement, polypharmacy.  -Mentation appears to be improving, did have some intermittent confusion, with tachycardia, most likely due to mild alcohol withdrawal during hospital stay . -This all has resolved, and mentation back to baseline.  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  Hypoalbuminemia in the setting of chronic liver disease Counseled on the importance of alcohol cessation in the setting of chronic liver disease,  Avoid hepatotoxic agents  Resolved hyperkalemia, post treatment Presented with potassium level of 6.0, has normalized after treatment, it is elevated again today at 5.7, for which she did receive Lokelma, just 3.9 today, I have stopped his Aldactone on discharge.  Acute transaminitis in the setting of ongoing alcohol use and alcoholic liver disease - AST>ALT which is typical for alcoholic liver disease.  It did significantly improve during hospital stay Elevated LFT on presentation in the setting of cirrhosis, treated chronic hepatitis C Avoid hepatotoxic agents No significant ascites for paracentesis on 04/03/2020 -Events of coagulopathy with INR of 2.1, he received vitamin K multiple times during hospital stay. -He is started on low-dose propranolol, especially he is with some tachycardia. -And had mild leukocytosis at 14, he was afebrile, nontoxic-appearing, this has been trending down, procalcitonin is reassuring  Hyperammonemia -It is improving, it is 24 today, I have decreased his lactulose to 20 g twice daily .  Alcohol use disorder with mild delirium CIWA protocol in place, he did have some mild alcohol withdrawal during hospital stay where he did require as needed Ativan, this all has resolved, no further evidence of withdrawals . Continue multivitamin,  thiamine and folic acid supplements  Polysubstance abuse including tobacco, alcohol and marijuana Per above, kindly see TOC note  Anemia of chronic disease in the setting of cirrhosis -Appears stable,   Right ankle pain due to gout -Ray with no acute findings, this has significantly improved with colchicine, to continue colchicine for another 5 days as an outpatient -Venous Doppler has been obtained for trace edema in lower extremities, it is negative for DVT.     Discharge Condition:  stable   Follow UP   Contact information for after-discharge care    Destination    [facility] Preferred SNF .   Service: Skilled Nursing Contact information: [address] [place] [phone]                    Discharge Instructions  and  Discharge Medications     Discharge Instructions    Discharge instructions   Complete by: As directed    Follow with Primary MD Jenny Reichmann, PA-C after discharge from SNF  Get CBC, CMP,checked  by Primary MD next visit.    Activity: As tolerated with Full fall precautions use walker/cane & assistance as needed   Disposition SNF   Diet: Heart Healthy /low salt , with feeding assistance and aspiration precautions.  On your next visit with your primary care physician please Get Medicines reviewed and adjusted.   Please request your Prim.MD to go over all Hospital Tests and Procedure/Radiological results at the follow up, please get all Hospital records sent to your Prim MD by signing hospital release before you go home.   If you experience worsening of your admission symptoms, develop shortness of breath, life threatening emergency, suicidal or homicidal thoughts you must seek medical attention immediately by calling 911 or calling your MD immediately  if symptoms less severe.  You Must read complete instructions/literature along with all the possible adverse reactions/side  effects for all the Medicines you take and that have been prescribed to you. Take any new Medicines after you have completely understood and accpet all the possible adverse reactions/side effects.   Do not drive, operating heavy machinery, perform activities at heights, swimming or participation in water activities or provide baby sitting services if your were admitted for syncope or siezures until you have seen by Primary MD or a Neurologist and advised to do so again.  Do not drive when taking Pain medications.    Do not take more than prescribed Pain, Sleep and Anxiety Medications  Special Instructions: If you have smoked or chewed Tobacco  in the last 2 yrs please stop smoking, stop any regular Alcohol  and or any Recreational drug use.  Wear Seat belts while driving.   Please note  You were cared for by a hospitalist during your hospital stay. If you have any questions about your discharge medications or the care you received while you were in the hospital after you are discharged, you can call the unit and asked to speak with the hospitalist on call if the hospitalist that took care of you is not available. Once you are discharged, your primary care physician will handle any further medical issues. Please note that NO REFILLS for any discharge medications will be authorized once you are discharged, as it is imperative that you return to your primary care physician (or establish a relationship with a primary care physician if you do not have one) for your aftercare needs so that they can reassess your need for medications and monitor your lab values.   Increase activity slowly   Complete by: As directed      Allergies as of 04/07/2020   No Known Allergies     Medication List    STOP taking these medications   amLODipine 5 MG tablet Commonly known as: NORVASC   prednisoLONE 5 MG Tabs tablet   predniSONE 20 MG tablet Commonly known as: DELTASONE   spironolactone 100 MG tablet  Commonly known as: ALDACTONE     TAKE these medications   allopurinol 100 MG tablet Commonly known as: ZYLOPRIM Take 200 mg by mouth daily.   colchicine 0.6 MG tablet Take 1 tablet (0.6 mg total) by mouth daily for 5 days. Take for 5 days and stop   folic acid 1 MG tablet Commonly known as: FOLVITE Take 1 mg by mouth daily.   furosemide 20 MG tablet Commonly known as: LASIX Take 20 mg by mouth daily. What changed: Another medication with the same name was removed. Continue taking this medication, and follow the directions  you see here.   gabapentin 100 MG capsule Commonly known as: NEURONTIN Take 100 mg by mouth in the morning, at noon, and at bedtime.   lactulose 10 GM/15ML solution Commonly known as: CHRONULAC Take 30 mLs (20 g total) by mouth 2 (two) times daily. What changed: how much to take   nicotine 21 mg/24hr patch Commonly known as: NICODERM CQ - dosed in mg/24 hours Place 1 patch (21 mg total) onto the skin daily. Start taking on: April 08, 2020   pantoprazole 40 MG tablet Commonly known as: PROTONIX Take 40 mg by mouth daily.   propranolol 10 MG tablet Commonly known as: INDERAL Take 1 tablet (10 mg total) by mouth 2 (two) times daily.   thiamine 100 MG tablet Take 1 tablet (100 mg total) by mouth daily. Start taking on: April 08, 2020   tiZANidine 4 MG tablet Commonly known as: ZANAFLEX Take 4 mg by mouth 3 (three) times daily as needed for muscle spasms.   Vitamin D (Ergocalciferol) 1.25 MG (50000 UNIT) Caps capsule Commonly known as: DRISDOL Take 50,000 Units by mouth every Friday.         Diet and Activity recommendation: See Discharge Instructions above   Consults obtained -  None   Major procedures and Radiology Reports - PLEASE review detailed and final reports for all details, in brief -     DG Ankle 2 Views Right  Result Date: 04/06/2020 CLINICAL DATA:  Right ankle pain without known injury. EXAM: RIGHT ANKLE - 2 VIEW  COMPARISON:  September 17, 2007. FINDINGS: There is no evidence of acute fracture, dislocation, or joint effusion. There is no evidence of arthropathy or other focal bone abnormality. Vascular calcifications are noted. IMPRESSION: No acute abnormality is noted in the right ankle. Electronically Signed   By: Marijo Conception M.D.   On: 04/06/2020 17:07   CT HEAD WO CONTRAST  Result Date: 04/02/2020 CLINICAL DATA:  Altered mental status, slurred speech, hallucinations EXAM: CT HEAD WITHOUT CONTRAST TECHNIQUE: Contiguous axial images were obtained from the base of the skull through the vertex without intravenous contrast. COMPARISON:  08/21/2018 FINDINGS: Brain: Normal anatomic configuration. Mild parenchymal volume loss is slightly advanced in relation to expectation for age, but appears stable since prior examination. Mild periventricular white matter changes are again noted, likely reflecting the sequela of small vessel ischemia. No abnormal intra or extra-axial mass lesion or fluid collection. No abnormal mass effect or midline shift. No evidence of acute intracranial hemorrhage or infarct. Ventricular size is normal. Cerebellum unremarkable. Vascular: Unremarkable Skull: Intact Sinuses/Orbits: Chronic fracture of the medial wall of the right orbit. 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 These are both unchanged from prior examination. There is mucosal thickening involving the poorly aerated right frontal sinus, several ethmoid air cells, and the sphenoid sinuses along with small air-fluid levels within the sphenoid sinuses in keeping with mild paranasal sinus disease. This appears new since prior examination. Other: Mastoid air cells and middle ear cavities are clear. IMPRESSION: No evidence of acute intracranial hemorrhage or infarct. Stable senescent changes. Mild paranasal sinus disease, new since prior examination.  Electronically Signed   By: Fidela Salisbury MD   On: 04/02/2020 22:11   IR ABDOMEN US LIMITED  Result Date: 04/03/2020 CLINICAL DATA:  59 year old male referred for possible paracentesis EXAM: LIMITED ABDOMEN ULTRASOUND FOR ASCITES TECHNIQUE: Limited ultrasound survey for ascites was performed in all four abdominal quadrants. COMPARISON:  None. FINDINGS: Scant ascites within the abdomen.  No paracentesis was performed. IMPRESSION: Scant ascites within the abdomen. Electronically Signed   By: Corrie Mckusick D.O.   On: 04/03/2020 11:44   VAS [place] LOWER EXTREMITY VENOUS (DVT)  Result Date: 04/07/2020  Lower Venous DVTStudy Indications: Edema.  Comparison Study: No prior studies. Performing Technologist: Darlin Coco  Examination Guidelines: A complete evaluation includes B-mode imaging, spectral Doppler, color Doppler, and power Doppler as needed of all accessible portions of each vessel. Bilateral testing is considered an integral part of a complete examination. Limited examinations for reoccurring indications may be performed as noted. The reflux portion of the exam is performed with the patient in reverse Trendelenburg.  +---------+---------------+---------+-----------+----------+--------------+ RIGHT    CompressibilityPhasicitySpontaneityPropertiesThrombus Aging +---------+---------------+---------+-----------+----------+--------------+ CFV      Full           Yes      Yes                                 +---------+---------------+---------+-----------+----------+--------------+ SFJ      Full                                                        +---------+---------------+---------+-----------+----------+--------------+ FV Prox  Full                                                        +---------+---------------+---------+-----------+----------+--------------+ FV Mid   Full                                                         +---------+---------------+---------+-----------+----------+--------------+ FV DistalFull                                                        +---------+---------------+---------+-----------+----------+--------------+ PFV      Full                                                        +---------+---------------+---------+-----------+----------+--------------+ POP      Full           Yes      Yes                                 +---------+---------------+---------+-----------+----------+--------------+ PTV      Full                                                        +---------+---------------+---------+-----------+----------+--------------+  PERO     Full                                                        +---------+---------------+---------+-----------+----------+--------------+   +---------+---------------+---------+-----------+----------+--------------+ LEFT     CompressibilityPhasicitySpontaneityPropertiesThrombus Aging +---------+---------------+---------+-----------+----------+--------------+ CFV      Full           Yes      Yes                                 +---------+---------------+---------+-----------+----------+--------------+ SFJ      Full                                                        +---------+---------------+---------+-----------+----------+--------------+ FV Prox  Full                                                        +---------+---------------+---------+-----------+----------+--------------+ FV Mid   Full                                                        +---------+---------------+---------+-----------+----------+--------------+ FV DistalFull                                                        +---------+---------------+---------+-----------+----------+--------------+ PFV      Full                                                         +---------+---------------+---------+-----------+----------+--------------+ POP      Full           Yes      Yes                                 +---------+---------------+---------+-----------+----------+--------------+ PTV      Full                                                        +---------+---------------+---------+-----------+----------+--------------+ PERO     Full                                                        +---------+---------------+---------+-----------+----------+--------------+  Summary: RIGHT: - There is no evidence of deep vein thrombosis in the lower extremity.  LEFT: - There is no evidence of deep vein thrombosis in the lower extremity.  - A 2.3 cm cystic structure is found in the popliteal fossa.  *See table(s) above for measurements and observations. Electronically signed by Ruta Hinds MD on 04/07/2020 at 12:30:12 PM.    Final     Micro Results     Recent Results (from the past 240 hour(s))  SARS Coronavirus 2 by RT PCR (hospital order, performed in [facility] hospital lab) Nasopharyngeal Nasopharyngeal Swab     Status: None   Collection Time: 04/03/20  2:54 AM   Specimen: Nasopharyngeal Swab  Result Value Ref Range Status   SARS Coronavirus 2 NEGATIVE NEGATIVE Final    Comment: (NOTE) SARS-CoV-2 target nucleic acids are NOT DETECTED.  The SARS-CoV-2 RNA is generally detectable in upper and lower respiratory specimens during the acute phase of infection. The lowest concentration of SARS-CoV-2 viral copies this assay can detect is 250 copies / mL. A negative result does not preclude SARS-CoV-2 infection and should not be used as the sole basis for treatment or other patient management decisions.  A negative result may occur with improper specimen collection / handling, submission of specimen other than nasopharyngeal swab, presence of viral mutation(s) within the areas targeted by this assay, and inadequate number of viral copies  (<250 copies / mL). A negative result must be combined with clinical observations, patient history, and epidemiological information.  Fact Sheet for Patients:   StrictlyIdeas.no  Fact Sheet for Healthcare Providers: BankingDealers.co.za  This test is not yet approved or  cleared by the [place] FDA and has been authorized for detection and/or diagnosis of SARS-CoV-2 by FDA under an Emergency Use Authorization (EUA).  This EUA will remain in effect (meaning this test can be used) for the duration of the COVID-19 declaration under Section 564(b)(1) of the Act, 21 U.S.C. section 360bbb-3(b)(1), unless the authorization is terminated or revoked sooner.  Performed at [facility], [place] [address] [place],  [address]        Today   Subjective:   Calvin [name] today has no headache,no chest abdominal pain,no new weakness tingling or numbness, feels much better wants to go home today.  He reports his ankle pain has resolved.  Objective:   Blood pressure 106/71, pulse 81, temperature 98.1 F (36.7 C), resp. rate 18, height 5\' 7"  (1.702 m), weight 83.5 kg, SpO2 97 %.   Intake/Output Summary (Last 24 hours) at 04/07/2020 1321 Last data filed at 04/07/2020 0601 Gross per 24 hour  Intake -  Output 500 ml  Net -500 ml    Exam Awake Alert, Oriented x 3, No new F.N deficits, Normal affect Symmetrical Chest wall movement, Good air movement bilaterally, CTAB RRR,No Gallops,Rubs or new Murmurs, No Parasternal Heave +ve B.Sounds, Abd Soft, Non tender, No organomegaly appriciated, No rebound -guarding or rigidity. No Cyanosis, Clubbing ,trace  edema, No new Rash or bruise  Data Review   CBC w Diff:  Lab Results  Component Value Date   WBC 11.7 (H) 04/07/2020   HGB 9.4 (L) 04/07/2020   HCT 28.4 (L) 04/07/2020   PLT 152 04/07/2020   LYMPHOPCT 28 08/21/2018   MONOPCT 12 08/21/2018   EOSPCT 0 08/21/2018   BASOPCT 1  08/21/2018    CMP:  Lab Results  Component Value Date   NA 133 (L) 04/07/2020   K 3.9 04/07/2020  CL 106 04/07/2020   CO2 18 (L) 04/07/2020   BUN 15 04/07/2020   CREATININE 1.21 04/07/2020   PROT 6.3 (L) 04/07/2020   ALBUMIN 2.1 (L) 04/07/2020   BILITOT 0.9 04/07/2020   ALKPHOS 87 04/07/2020   AST 84 (H) 04/07/2020   ALT 52 (H) 04/07/2020  .   Total Time in preparing paper work, data evaluation and todays exam - 42 minutes  Phillips Climes M.D on 04/07/2020 at 1:21 PM  [facility]   Office  [phone]

## 2020-04-07 NOTE — Discharge Instructions (Signed)
Follow with Primary MD Calvin Reichmann, PA-C after discharge from SNF  Get CBC, CMP,checked  by Primary MD next visit.    Activity: As tolerated with Full fall precautions use walker/cane & assistance as needed   Disposition SNF   Diet: Heart Healthy /low salt , with feeding assistance and aspiration precautions.  On your next visit with your primary care physician please Get Medicines reviewed and adjusted.   Please request your Prim.MD to go over all Hospital Tests and Procedure/Radiological results at the follow up, please get all Hospital records sent to your Prim MD by signing hospital release before you go home.   If you experience worsening of your admission symptoms, develop shortness of breath, life threatening emergency, suicidal or homicidal thoughts you must seek medical attention immediately by calling 911 or calling your MD immediately  if symptoms less severe.  You Must read complete instructions/literature along with all the possible adverse reactions/side effects for all the Medicines you take and that have been prescribed to you. Take any new Medicines after you have completely understood and accpet all the possible adverse reactions/side effects.   Do not drive, operating heavy machinery, perform activities at heights, swimming or participation in water activities or provide baby sitting services if your were admitted for syncope or siezures until you have seen by Primary MD or a Neurologist and advised to do so again.  Do not drive when taking Pain medications.    Do not take more than prescribed Pain, Sleep and Anxiety Medications  Special Instructions: If you have smoked or chewed Tobacco  in the last 2 yrs please stop smoking, stop any regular Alcohol  and or any Recreational drug use.  Wear Seat belts while driving.   Please note  You were cared for by a hospitalist during your hospital stay. If you have any questions about your discharge medications  or the care you received while you were in the hospital after you are discharged, you can call the unit and asked to speak with the hospitalist on call if the hospitalist that took care of you is not available. Once you are discharged, your primary care physician will handle any further medical issues. Please note that NO REFILLS for any discharge medications will be authorized once you are discharged, as it is imperative that you return to your primary care physician (or establish a relationship with a primary care physician if you do not have one) for your aftercare needs so that they can reassess your need for medications and monitor your lab values.

## 2020-04-07 NOTE — TOC Transition Note (Addendum)
Transition of Care  [facility]) - CM/SW Discharge Note   Patient Details  Name: NIKOLAOS MADDOCKS MRN: [id number] Date of Birth: [birth year]  Transition of Care [facility]) CM/SW Contact:  Jacquelynn Cree Phone Number: 04/07/2020, 2:38 PM   Clinical Narrative:    Patient will DC to: [facility] Transport by: Corey Harold  RN, patient, and facility notified of DC. Patient declined spouse being called, stated she is already aware. Discharge Summary and FL2 sent to facility. RN to call report prior to discharge ([phone] Rm [phone]). DC packet on chart. Ambulance transport requested for patient.   CSW will sign off for now as social work intervention is no longer needed. Please consult [place] again if new needs arise.   Final next level of care: Skilled Nursing Facility Barriers to Discharge: No Barriers Identified   Patient Goals and CMS Choice Patient states their goals for this hospitalization and ongoing recovery are:: Wants to go get better to go home CMS Medicare.gov Compare Post Acute Care list provided to:: Patient Choice offered to / list presented to : Patient  Discharge Placement              [facility] Patient to be transferred to facility by: [facility]   Patient and family notified of of transfer: 04/07/20  Discharge Plan and Services In-house Referral: NA Discharge Planning Services: CM Consult Post Acute Care Choice: [facility]          DME Arranged: N/A DME Agency: NA       HH Arranged: NA HH Agency: NA        Social Determinants of Health (SDOH) Interventions     Readmission Risk Interventions No flowsheet data found.

## 2020-04-10 ENCOUNTER — Emergency Department (HOSPITAL_COMMUNITY)
Admission: EM | Admit: 2020-04-10 | Discharge: 2020-04-11 | Disposition: A | Discharge status: Home / Self Care | Payer: Medicare Other | Attending: Emergency Medicine | Admitting: Emergency Medicine

## 2020-04-10 ENCOUNTER — Encounter (HOSPITAL_COMMUNITY): Payer: Self-pay

## 2020-04-10 DIAGNOSIS — R609 Edema, unspecified: Secondary | ICD-10-CM | POA: Diagnosis not present

## 2020-04-10 DIAGNOSIS — I10 Essential (primary) hypertension: Secondary | ICD-10-CM | POA: Diagnosis not present

## 2020-04-10 DIAGNOSIS — F17210 Nicotine dependence, cigarettes, uncomplicated: Secondary | ICD-10-CM | POA: Diagnosis not present

## 2020-04-10 DIAGNOSIS — R2243 Localized swelling, mass and lump, lower limb, bilateral: Secondary | ICD-10-CM | POA: Diagnosis present

## 2020-04-10 DIAGNOSIS — Z79899 Other long term (current) drug therapy: Secondary | ICD-10-CM | POA: Diagnosis not present

## 2020-04-10 LAB — BASIC METABOLIC PANEL
Anion gap: 8 (ref 5–15)
BUN: 8 mg/dL (ref 6–20)
CO2: 20 mmol/L — ABNORMAL LOW (ref 22–32)
Calcium: 9.6 mg/dL (ref 8.9–10.3)
Chloride: 111 mmol/L (ref 98–111)
Creatinine, Ser: 1.31 mg/dL — ABNORMAL HIGH (ref 0.61–1.24)
GFR calc Af Amer: >60 mL/min (ref >60)
GFR calc non Af Amer: 59 mL/min — ABNORMAL LOW (ref >60)
Glucose, Bld: 131 mg/dL — ABNORMAL HIGH (ref 70–99)
Potassium: 3.6 mmol/L (ref 3.5–5.1)
Sodium: 139 mmol/L (ref 135–145)

## 2020-04-10 LAB — CBC
HCT: 31.6 % — ABNORMAL LOW (ref 39.0–52.0)
Hemoglobin: 10.3 g/dL — ABNORMAL LOW (ref 13.0–17.0)
MCH: 33.1 pg (ref 26.0–34.0)
MCHC: 32.6 g/dL (ref 30.0–36.0)
MCV: 101.6 fL — ABNORMAL HIGH (ref 80.0–100.0)
Platelets: 242 10*3/uL (ref 150–400)
RBC: 3.11 MIL/uL — ABNORMAL LOW (ref 4.22–5.81)
RDW: 13.7 % (ref 11.5–15.5)
WBC: 9.5 10*3/uL (ref 4.0–10.5)
nRBC: 0 % (ref 0.0–0.2)

## 2020-04-10 MED ORDER — SODIUM CHLORIDE 0.9% FLUSH: 3.0000 mL | Freq: Once | INTRAVENOUS | Status: DC

## 2020-04-10 NOTE — ED Triage Notes (Addendum)
Pt arrives to ED w/ c/o BLE edema that started Saturday. Pt denies CHF hx. Pt denies chest pain, sob. Pt has hx of cirrhosis.

## 2020-04-11 ENCOUNTER — Other Ambulatory Visit: Payer: Self-pay

## 2020-04-11 ENCOUNTER — Emergency Department (HOSPITAL_COMMUNITY): Payer: Medicare Other

## 2020-04-11 ENCOUNTER — Emergency Department (HOSPITAL_COMMUNITY): Admit: 2020-04-11 | Payer: Medicare Other

## 2020-04-11 DIAGNOSIS — R609 Edema, unspecified: Secondary | ICD-10-CM | POA: Diagnosis not present

## 2020-04-11 LAB — HEPATIC FUNCTION PANEL
ALT: 34 U/L (ref 0–44)
AST: 43 U/L — ABNORMAL HIGH (ref 15–41)
Albumin: 2.6 g/dL — ABNORMAL LOW (ref 3.5–5.0)
Alkaline Phosphatase: 94 U/L (ref 38–126)
Bilirubin, Direct: 0.2 mg/dL (ref 0.0–0.2)
Indirect Bilirubin: 0.8 mg/dL (ref 0.3–0.9)
Total Bilirubin: 1 mg/dL (ref 0.3–1.2)
Total Protein: 7.7 g/dL (ref 6.5–8.1)

## 2020-04-11 MED ORDER — GABAPENTIN 100 MG PO CAPS
100.0000 mg | ORAL_CAPSULE | Freq: Once | ORAL | Status: AC
  Administered 2020-04-11: 100 mg via ORAL
  Filled 2020-04-11: qty 1

## 2020-04-11 MED ORDER — FUROSEMIDE 10 MG/ML IJ SOLN
20.0000 mg | Freq: Once | INTRAMUSCULAR | Status: AC
  Administered 2020-04-11: 20 mg via INTRAVENOUS
  Filled 2020-04-11: qty 2

## 2020-04-11 MED ORDER — SODIUM CHLORIDE 0.9 % IV BOLUS: 500.0000 mL | Freq: Once | INTRAVENOUS | Status: DC

## 2020-04-11 MED ORDER — PROPRANOLOL HCL 10 MG PO TABS
10.0000 mg | ORAL_TABLET | Freq: Once | ORAL | Status: AC
  Administered 2020-04-11: 10 mg via ORAL
  Filled 2020-04-11: qty 1

## 2020-04-11 NOTE — ED Notes (Signed)
ED Provider at bedside. 

## 2020-04-11 NOTE — Discharge Instructions (Signed)
Take your Lasix 2 times a day for the next 3 days.  After this, return to taking it once a day. Use compression socks to help with swelling. Continue not drinking alcohol. Follow-up with your doctor for reevaluation of your symptoms. Return to the emergency room with any new, sudden, concerning symptoms.

## 2020-04-11 NOTE — ED Provider Notes (Signed)
[facility] EMERGENCY DEPARTMENT Provider Note   CSN: [id number] Arrival date & time: 04/10/20  1936     History Chief Complaint  Patient presents with  . Foot Swelling    Calvin [name] is a 59 y.o. male presenting for evaluation of bilateral foot swelling.  Patient states that the past several days, he has had gradually worsening bilateral lower leg swelling.  It is worse on the right side.  This began while he was hospitalized last week.  He does not know if he is on Lasix or other fluid pills.  He states he is taking all of his medications as prescribed.  He denies fevers, chills, cough, shortness of breath, chest pain, nausea, vomiting, Donnell pain or swelling, urinary symptoms, abnormal bowel movements. He states he stopped drinking etoh last week with his hospilaization.   Additional history obtained from chart review.  Patient with a history of cirrhosis due to alcohol abuse.  Additional history of hypertension.  He was hospitalized last week for an AKI and cirrhosis  HPI     Past Medical History:  Diagnosis Date  . Ascites   . Chronic left hip pain   . Cirrhosis ([place])   . Hypertension   . TIA (transient ischemic attack) 06/23/2016    Patient Active Problem List   Diagnosis Date Noted  . AKI (acute kidney injury) ([place]) 04/02/2020  . Hyperkalemia 04/02/2020  . Hallucinations 04/02/2020  . Anemia of chronic disease 04/02/2020  . Chronic hepatitis C without hepatic coma ([place]) 08/09/2019  . Ascites due to alcoholic cirrhosis ([place])   . SOB (shortness of breath)   . Decompensated hepatic cirrhosis ([place]) 07/15/2019  . Essential hypertension 06/23/2016  . Alcohol abuse 06/23/2016  . Chronic left hip pain 06/23/2016  . Hyponatremia 06/23/2016  . Hyperlipidemia   . Blind right eye     Past Surgical History:  Procedure Laterality Date  . BACK SURGERY    . CERVICAL SPINE SURGERY    . JOINT REPLACEMENT         No family history on  file.  Social History   Tobacco Use  . Smoking status: Current Every Day Smoker  . Smokeless tobacco: Never Used  Substance Use Topics  . Alcohol use: Yes  . Drug use: No    Home Medications Prior to Admission medications   Medication Sig Start Date End Date Taking? Authorizing Provider  allopurinol (ZYLOPRIM) 100 MG tablet Take 200 mg by mouth daily. 03/27/20  Yes [provider]  colchicine 0.6 MG tablet Take 1 tablet (0.6 mg total) by mouth daily for 5 days. Take for 5 days and stop 04/07/20 04/12/20 Yes Elgergawy, Silver Huguenin, MD  folic acid (FOLVITE) 1 MG tablet Take 1 mg by mouth daily. 02/14/20  Yes [provider]  furosemide (LASIX) 20 MG tablet Take 20 mg by mouth daily. 02/14/20  Yes [provider]  gabapentin (NEURONTIN) 100 MG capsule Take 100 mg by mouth in the morning, at noon, and at bedtime. 03/23/20  Yes [provider]  lactulose (CHRONULAC) 10 GM/15ML solution Take 30 mLs (20 g total) by mouth 2 (two) times daily. 04/07/20  Yes Elgergawy, Silver Huguenin, MD  pantoprazole (PROTONIX) 40 MG tablet Take 40 mg by mouth daily. 01/26/20  Yes [provider]  propranolol (INDERAL) 10 MG tablet Take 1 tablet (10 mg total) by mouth 2 (two) times daily. 04/07/20  Yes Elgergawy, Silver Huguenin, MD  thiamine 100 MG tablet Take 1 tablet (  100 mg total) by mouth daily. 04/08/20  Yes Elgergawy, Silver Huguenin, MD  tiZANidine (ZANAFLEX) 4 MG tablet Take 4 mg by mouth 3 (three) times daily as needed for muscle spasms.  03/13/20  Yes [provider]  Vitamin D, Ergocalciferol, (DRISDOL) 1.25 MG (50000 UNIT) CAPS capsule Take 50,000 Units by mouth every Friday. 03/17/20  Yes [provider]  nicotine (NICODERM CQ - DOSED IN MG/24 HOURS) 21 mg/24hr patch Place 1 patch (21 mg total) onto the skin daily. 04/08/20   Elgergawy, Silver Huguenin, MD    Allergies    Patient has no known allergies.  Review of Systems   Review of Systems  Cardiovascular: Positive for leg  swelling.  All other systems reviewed and are negative.   Physical Exam Updated Vital Signs BP 134/90   Pulse 97   Temp 97.9 F (36.6 C) (Oral)   Resp (!) 21   SpO2 100%   Physical Exam Vitals and nursing note reviewed.  Constitutional:      General: He is not in acute distress.    Appearance: He is well-developed.     Comments: Resting in the bed in no acute distress  HENT:     Head: Normocephalic and atraumatic.  Eyes:     Conjunctiva/sclera: Conjunctivae normal.     Pupils: Pupils are equal, round, and reactive to light.  Cardiovascular:     Rate and Rhythm: Regular rhythm. Tachycardia present.     Pulses: Normal pulses.     Comments: Tachycardic around 115 Pulmonary:     Effort: Pulmonary effort is normal. No respiratory distress.     Breath sounds: Normal breath sounds. No wheezing.  Abdominal:     General: There is no distension.     Palpations: Abdomen is soft. There is no mass.     Tenderness: There is no abdominal tenderness. There is no guarding or rebound.  Musculoskeletal:        General: Normal range of motion.     Cervical back: Normal range of motion and neck supple.     Right lower leg: Edema present.     Left lower leg: Edema present.     Comments: Lower ext swelling bilaterally, R?L  Skin:    General: Skin is warm and dry.     Capillary Refill: Capillary refill takes less than 2 seconds.  Neurological:     Mental Status: He is alert and oriented to person, place, and time.     ED Results / Procedures / Treatments   Labs (all labs ordered are listed, but only abnormal results are displayed) Labs Reviewed  BASIC METABOLIC PANEL - Abnormal; Notable for the following components:      Result Value   CO2 20 (*)    Glucose, Bld 131 (*)    Creatinine, Ser 1.31 (*)    GFR calc non Af Amer 59 (*)    All other components within normal limits  CBC - Abnormal; Notable for the following components:   RBC 3.11 (*)    Hemoglobin 10.3 (*)    HCT 31.6  (*)    MCV 101.6 (*)    All other components within normal limits  HEPATIC FUNCTION PANEL - Abnormal; Notable for the following components:   Albumin 2.6 (*)    AST 43 (*)    All other components within normal limits    EKG EKG Interpretation  Date/Time:  Wednesday April 11 2020 08:49:48 EDT Ventricular Rate:  112 PR Interval:  QRS Duration: 75 QT Interval:  304 QTC Calculation: 415 R Axis:   43 Text Interpretation: Sinus tachycardia Borderline T abnormalities, inferior leads No significant change since last tracing Confirmed by Blanchie Dessert [phone]) on 04/11/2020 9:54:21 AM   Radiology DG Chest 2 View  Result Date: 04/11/2020 CLINICAL DATA:  Bilateral lower extremity edema. EXAM: CHEST - 2 VIEW COMPARISON:  07/15/2019 FINDINGS: The cardiomediastinal silhouette is unchanged with normal heart size. There is slight chronic interstitial coarsening. No confluent airspace opacity, edema, pleural effusion, pneumothorax is identified. No acute osseous abnormality is seen. IMPRESSION: No active cardiopulmonary disease. Electronically Signed   By: Logan Bores M.D.   On: 04/11/2020 05:42    Procedures Procedures (including critical care time)  Medications Ordered in ED Medications  sodium chloride flush (NS) 0.9 % injection 3 mL (has no administration in time range)  propranolol (INDERAL) tablet 10 mg (10 mg Oral Given 04/11/20 1047)  furosemide (LASIX) injection 20 mg (20 mg Intravenous Given 04/11/20 1048)  gabapentin (NEURONTIN) capsule 100 mg (100 mg Oral Given 04/11/20 1205)    ED Course  I have reviewed the triage vital signs and the nursing notes.  Pertinent labs & imaging results that were available during my care of the patient were reviewed by me and considered in my medical decision making (see chart for details).    MDM Rules/Calculators/A&P                          Patient presenting for evaluation of bilateral leg swelling.  On exam, patient is nontoxic.  He has  mild leg swelling, worse on the right.  However on chart review, patient had an ultrasound 3 days ago which was negative for DVT.  Adenopathy is needs repeat, as he did have swelling at that time.  Additionally, patient is tachycardic.  On chart review, he takes propranolol, and has not yet had that today.  This is likely contributing to his tachycardia.  Will give a home dose of his propranolol and reassess.  Leg swelling is likely related to his cirrhosis, and per chart review he is on Lasix.  Will give IV Lasix and reassess.  Labs obtained from triage read interpreted by me, overall reassuring.  Electrolytes stable.  Hemoglobin stable.  Kidney function stable.  Unfortunately, LFTs were not drawn, will add on LFTs for further evaluation.  LFTs negative for acute findings.  On reassessment, heart rate has improved with propranolol, he is no longer tachycardic.  He remains nontoxic on evaluation.  I discussed symptomatic management with compression socks, as well as increasing his Lasix to twice a day for the next few days.  Encourage close follow-up with his primary care doctor.  Case discussed with attending, Dr. Maryan Rued evaluated the patient.  At this time, patient appears safe for discharge.  Return precautions given.  Patient states he understands and agrees to plan.  Final Clinical Impression(s) / ED Diagnoses Final diagnoses:  Peripheral edema    Rx / DC Orders ED Discharge Orders    None       Franchot Heidelberg, PA-C 04/11/20 1600    Blanchie Dessert, MD 04/12/20 2226

## 2020-04-11 NOTE — ED Notes (Signed)
Pt family member states he refuses vitals he is only coming inside when his room is ready

## 2020-05-07 ENCOUNTER — Institutional Professional Consult (permissible substitution): Payer: Medicare Other | Admitting: Critical Care Medicine

## 2020-06-06 ENCOUNTER — Institutional Professional Consult (permissible substitution): Payer: Medicare Other | Admitting: Pulmonary Disease

## 2020-07-11 ENCOUNTER — Other Ambulatory Visit: Payer: Self-pay

## 2020-07-11 ENCOUNTER — Ambulatory Visit (INDEPENDENT_AMBULATORY_CARE_PROVIDER_SITE_OTHER): Payer: Medicare Other | Admitting: Pulmonary Disease

## 2020-07-11 ENCOUNTER — Encounter: Payer: Self-pay | Admitting: Pulmonary Disease

## 2020-07-11 VITALS — BP 126/66 | HR 68 | Temp 97.6°F | Ht 67.0 in | Wt 174.8 lb

## 2020-07-11 DIAGNOSIS — Z23 Encounter for immunization: Secondary | ICD-10-CM

## 2020-07-11 DIAGNOSIS — J849 Interstitial pulmonary disease, unspecified: Secondary | ICD-10-CM

## 2020-07-11 NOTE — Progress Notes (Signed)
Calvin [name]    [id number]    08-13-1961  Primary Care Physician:Livengood, Marva Panda, PA-C  Referring Physician: Jenny Reichmann, PA-C [address] [place],  [place] [address]  Chief complaint: Consult for ILD evaluation  HPI: 59 year old active smoker with history of hepatitis C, cirrhosis, alcohol abuse Evaluated at [facility] rheumatology for elevated rheumatoid factor and chronic joint pain.  Per evaluation his rheumatoid factor was very low with negative CCP with no significant joint findings suggestive of inflammatory arthropathy.  It was suspected that his low positive rheumatoid factor as a result of hepatitis C and also has severe osteoarthritis of left knee  Noted to have crepitus on auscultation with chest x-ray showing mild interstitial prominence.  He has been referred to pulmonary for evaluation of interstitial lung disease.  Regarding his hepatitis C as per patient he got few months of medication but he does not recall the name and states that his hepatitis C has resolved.  At present he denies any dyspnea.  No cough, fevers, chills  Pets: No pets Occupation: Used to work in a [employer].  Currently on disability Exposures: No known exposures.  No mold, hot tub, Jacuzzi.  No feather pillows or comforter Smoking history: 20-pack-year smoker.  Continues to smoke half pack per day.  Drinks a couple beers every day Travel history: Previously lived in [place].  No significant recent travel Relevant family history: Sister had COPD.  She was a smoker.  Outpatient Encounter Medications as of 07/11/2020  Medication Sig  . Oxycodone HCl 10 MG TABS Take 10 mg by mouth 4 (four) times daily as needed.  Marland Kitchen allopurinol (ZYLOPRIM) 100 MG tablet Take 200 mg by mouth daily.  . colchicine 0.6 MG tablet Take 1 tablet (0.6 mg total) by mouth daily for 5 days. Take for 5 days and stop  . folic acid (FOLVITE) 1 MG tablet Take 1 mg by mouth daily.  . furosemide  (LASIX) 20 MG tablet Take 20 mg by mouth daily.  Marland Kitchen gabapentin (NEURONTIN) 100 MG capsule Take 100 mg by mouth in the morning, at noon, and at bedtime.  Marland Kitchen lactulose (CHRONULAC) 10 GM/15ML solution Take 30 mLs (20 g total) by mouth 2 (two) times daily.  . nicotine (NICODERM CQ - DOSED IN MG/24 HOURS) 21 mg/24hr patch Place 1 patch (21 mg total) onto the skin daily.  . pantoprazole (PROTONIX) 40 MG tablet Take 40 mg by mouth daily.  . propranolol (INDERAL) 10 MG tablet Take 1 tablet (10 mg total) by mouth 2 (two) times daily.  Marland Kitchen thiamine 100 MG tablet Take 1 tablet (100 mg total) by mouth daily.  Marland Kitchen tiZANidine (ZANAFLEX) 4 MG tablet Take 4 mg by mouth 3 (three) times daily as needed for muscle spasms.   . Vitamin D, Ergocalciferol, (DRISDOL) 1.25 MG (50000 UNIT) CAPS capsule Take 50,000 Units by mouth every Friday.   No facility-administered encounter medications on file as of 07/11/2020.    Allergies as of 07/11/2020  . (No Known Allergies)    Past Medical History:  Diagnosis Date  . Ascites   . Chronic left hip pain   . Cirrhosis ([place])   . Hypertension   . TIA (transient ischemic attack) 06/23/2016    Past Surgical History:  Procedure Laterality Date  . BACK SURGERY    . CERVICAL SPINE SURGERY    . JOINT REPLACEMENT      No family history on file.  Social History  Socioeconomic History  . Marital status: Married    Spouse name: Not on file  . Number of children: Not on file  . Years of education: Not on file  . Highest education level: Not on file  Occupational History  . Not on file  Tobacco Use  . Smoking status: Current Every Day Smoker    Types: Cigarettes  . Smokeless tobacco: Never Used  . Tobacco comment: 1/2 pack per day  Substance and Sexual Activity  . Alcohol use: Yes    Comment: 2 beers/day  . Drug use: No  . Sexual activity: Not on file  Other Topics Concern  . Not on file  Social History Narrative  . Not on file   Social Determinants of Health     Financial Resource Strain:   . Difficulty of Paying Living Expenses: Not on file  Food Insecurity:   . Worried About Charity fundraiser in the Last Year: Not on file  . Ran Out of Food in the Last Year: Not on file  Transportation Needs:   . Lack of Transportation (Medical): Not on file  . Lack of Transportation (Non-Medical): Not on file  Physical Activity:   . Days of Exercise per Week: Not on file  . Minutes of Exercise per Session: Not on file  Stress:   . Feeling of Stress : Not on file  Social Connections:   . Frequency of Communication with Friends and Family: Not on file  . Frequency of Social Gatherings with Friends and Family: Not on file  . Attends Religious Services: Not on file  . Active Member of Clubs or Organizations: Not on file  . Attends Archivist Meetings: Not on file  . Marital Status: Not on file  Intimate Partner Violence:   . Fear of Current or Ex-Partner: Not on file  . Emotionally Abused: Not on file  . Physically Abused: Not on file  . Sexually Abused: Not on file    Review of systems: Review of Systems  Constitutional: Negative for fever and chills.  HENT: Negative.   Eyes: Negative for blurred vision.  Respiratory: as per HPI  Cardiovascular: Negative for chest pain and palpitations.  Gastrointestinal: Negative for vomiting, diarrhea, blood per rectum. Genitourinary: Negative for dysuria, urgency, frequency and hematuria.  Musculoskeletal: Negative for myalgias, back pain and joint pain.  Skin: Negative for itching and rash.  Neurological: Negative for dizziness, tremors, focal weakness, seizures and loss of consciousness.  Endo/Heme/Allergies: Negative for environmental allergies.  Psychiatric/Behavioral: Negative for depression, suicidal ideas and hallucinations.  All other systems reviewed and are negative.  Physical Exam: Blood pressure 126/66, pulse 68, temperature 97.6 F (36.4 C), temperature source Skin, height 5\' 7"   (1.702 m), weight 174 lb 12.8 oz (79.3 kg), SpO2 99 %. Gen:      No acute distress HEENT:  EOMI, sclera anicteric Neck:     No masses; no thyromegaly Lungs:    Clear to auscultation bilaterally; normal respiratory effort CV:         Regular rate and rhythm; no murmurs Abd:      + bowel sounds; soft, non-tender; no palpable masses, no distension Ext:    No edema; adequate peripheral perfusion Skin:      Warm and dry; no rash Neuro: alert and oriented x 3 Psych: normal mood and affect  Data Reviewed: Imaging: CTA 02/15/2018-no PE, mild bibasal atelectasis CT abdomen pelvis 07/15/2019-visualized lung bases are clear CXR 04/11/2020-no active cardiopulmonary disease I have  reviewed the images personally.  PFTs:  Labs:  Assessment:  Assessment for ILD, COPD Suspicion for interstitial lung disease is low as previous imaging including CTA in 2019 and CT abdomen pelvis in 2020 did not show any significant lung abnormalities. Borderline elevated rheumatoid factor is not felt to be autoimmune process per assessment by rheumatology.  We will assess with high-res CT PFTs to evaluate lung function and also rule out COPD.  Plan/Recommendations: - High-res CT, PFTs  Marshell Garfinkel MD [place] [facility] 07/11/2020, 9:19 AM  CC: Livengood, Marva Panda, P*

## 2020-07-11 NOTE — Patient Instructions (Signed)
We will schedule high-resolution CT and pulmonary function test for further evaluation of your lungs.  Follow-up in 1 to 2 months.

## 2020-07-11 NOTE — Addendum Note (Signed)
Addended by: Elton Sin on: 07/11/2020 12:21 PM   Modules accepted: Orders

## 2020-07-20 ENCOUNTER — Ambulatory Visit (HOSPITAL_COMMUNITY): Payer: Medicare Other | Attending: Pulmonary Disease

## 2020-07-25 ENCOUNTER — Other Ambulatory Visit: Payer: Self-pay | Admitting: Gastroenterology

## 2020-07-25 DIAGNOSIS — K7011 Alcoholic hepatitis with ascites: Secondary | ICD-10-CM

## 2020-07-27 ENCOUNTER — Emergency Department (HOSPITAL_COMMUNITY): Payer: Medicare Other

## 2020-07-27 ENCOUNTER — Inpatient Hospital Stay (HOSPITAL_COMMUNITY): Payer: Medicare Other

## 2020-07-27 ENCOUNTER — Inpatient Hospital Stay (HOSPITAL_COMMUNITY)
Admission: EM | Admit: 2020-07-27 | Discharge: 2020-08-09 | DRG: 682 | Disposition: A | Discharge status: Home / Self Care | Payer: Medicare Other | Attending: Internal Medicine | Admitting: Internal Medicine

## 2020-07-27 ENCOUNTER — Encounter (HOSPITAL_COMMUNITY): Payer: Self-pay | Admitting: Emergency Medicine

## 2020-07-27 ENCOUNTER — Other Ambulatory Visit: Payer: Self-pay

## 2020-07-27 DIAGNOSIS — G9341 Metabolic encephalopathy: Secondary | ICD-10-CM | POA: Diagnosis present

## 2020-07-27 DIAGNOSIS — D631 Anemia in chronic kidney disease: Secondary | ICD-10-CM | POA: Diagnosis present

## 2020-07-27 DIAGNOSIS — K7030 Alcoholic cirrhosis of liver without ascites: Secondary | ICD-10-CM | POA: Diagnosis present

## 2020-07-27 DIAGNOSIS — R4781 Slurred speech: Secondary | ICD-10-CM | POA: Diagnosis not present

## 2020-07-27 DIAGNOSIS — R5381 Other malaise: Secondary | ICD-10-CM | POA: Diagnosis not present

## 2020-07-27 DIAGNOSIS — E785 Hyperlipidemia, unspecified: Secondary | ICD-10-CM | POA: Diagnosis present

## 2020-07-27 DIAGNOSIS — Z9660 Presence of unspecified orthopedic joint implant: Secondary | ICD-10-CM | POA: Diagnosis present

## 2020-07-27 DIAGNOSIS — R509 Fever, unspecified: Secondary | ICD-10-CM

## 2020-07-27 DIAGNOSIS — R188 Other ascites: Secondary | ICD-10-CM

## 2020-07-27 DIAGNOSIS — F1010 Alcohol abuse, uncomplicated: Secondary | ICD-10-CM | POA: Diagnosis not present

## 2020-07-27 DIAGNOSIS — Z66 Do not resuscitate: Secondary | ICD-10-CM | POA: Diagnosis not present

## 2020-07-27 DIAGNOSIS — E860 Dehydration: Secondary | ICD-10-CM | POA: Diagnosis present

## 2020-07-27 DIAGNOSIS — G9340 Encephalopathy, unspecified: Secondary | ICD-10-CM

## 2020-07-27 DIAGNOSIS — N179 Acute kidney failure, unspecified: Secondary | ICD-10-CM | POA: Diagnosis not present

## 2020-07-27 DIAGNOSIS — N170 Acute kidney failure with tubular necrosis: Principal | ICD-10-CM | POA: Diagnosis present

## 2020-07-27 DIAGNOSIS — K7290 Hepatic failure, unspecified without coma: Secondary | ICD-10-CM | POA: Diagnosis present

## 2020-07-27 DIAGNOSIS — Z515 Encounter for palliative care: Secondary | ICD-10-CM

## 2020-07-27 DIAGNOSIS — N200 Calculus of kidney: Secondary | ICD-10-CM | POA: Diagnosis present

## 2020-07-27 DIAGNOSIS — I129 Hypertensive chronic kidney disease with stage 1 through stage 4 chronic kidney disease, or unspecified chronic kidney disease: Secondary | ICD-10-CM | POA: Diagnosis present

## 2020-07-27 DIAGNOSIS — N182 Chronic kidney disease, stage 2 (mild): Secondary | ICD-10-CM | POA: Diagnosis not present

## 2020-07-27 DIAGNOSIS — F17210 Nicotine dependence, cigarettes, uncomplicated: Secondary | ICD-10-CM | POA: Diagnosis present

## 2020-07-27 DIAGNOSIS — N390 Urinary tract infection, site not specified: Secondary | ICD-10-CM | POA: Diagnosis not present

## 2020-07-27 DIAGNOSIS — F10231 Alcohol dependence with withdrawal delirium: Secondary | ICD-10-CM | POA: Diagnosis present

## 2020-07-27 DIAGNOSIS — Z79899 Other long term (current) drug therapy: Secondary | ICD-10-CM

## 2020-07-27 DIAGNOSIS — K7460 Unspecified cirrhosis of liver: Secondary | ICD-10-CM | POA: Diagnosis present

## 2020-07-27 DIAGNOSIS — E872 Acidosis: Secondary | ICD-10-CM | POA: Diagnosis present

## 2020-07-27 DIAGNOSIS — Z20822 Contact with and (suspected) exposure to covid-19: Secondary | ICD-10-CM | POA: Diagnosis present

## 2020-07-27 DIAGNOSIS — K766 Portal hypertension: Secondary | ICD-10-CM | POA: Diagnosis present

## 2020-07-27 DIAGNOSIS — I10 Essential (primary) hypertension: Secondary | ICD-10-CM | POA: Diagnosis not present

## 2020-07-27 DIAGNOSIS — M6282 Rhabdomyolysis: Secondary | ICD-10-CM | POA: Diagnosis present

## 2020-07-27 DIAGNOSIS — Z8673 Personal history of transient ischemic attack (TIA), and cerebral infarction without residual deficits: Secondary | ICD-10-CM

## 2020-07-27 DIAGNOSIS — B9620 Unspecified Escherichia coli [E. coli] as the cause of diseases classified elsewhere: Secondary | ICD-10-CM | POA: Diagnosis not present

## 2020-07-27 DIAGNOSIS — R4182 Altered mental status, unspecified: Secondary | ICD-10-CM | POA: Diagnosis present

## 2020-07-27 DIAGNOSIS — B182 Chronic viral hepatitis C: Secondary | ICD-10-CM | POA: Diagnosis present

## 2020-07-27 DIAGNOSIS — F10931 Alcohol use, unspecified with withdrawal delirium: Secondary | ICD-10-CM

## 2020-07-27 HISTORY — DX: Cerebral infarction, unspecified: I63.9

## 2020-07-27 LAB — CBC
HCT: 33.7 % — ABNORMAL LOW (ref 39.0–52.0)
Hemoglobin: 11.4 g/dL — ABNORMAL LOW (ref 13.0–17.0)
MCH: 32 pg (ref 26.0–34.0)
MCHC: 33.8 g/dL (ref 30.0–36.0)
MCV: 94.7 fL (ref 80.0–100.0)
Platelets: 299 10*3/uL (ref 150–400)
RBC: 3.56 MIL/uL — ABNORMAL LOW (ref 4.22–5.81)
RDW: 14.9 % (ref 11.5–15.5)
WBC: 13.9 10*3/uL — ABNORMAL HIGH (ref 4.0–10.5)
nRBC: 0.4 % — ABNORMAL HIGH (ref 0.0–0.2)

## 2020-07-27 LAB — URINALYSIS, ROUTINE W REFLEX MICROSCOPIC
Bilirubin Urine: NEGATIVE
Glucose, UA: NEGATIVE mg/dL
Ketones, ur: NEGATIVE mg/dL
Leukocytes,Ua: NEGATIVE
Nitrite: NEGATIVE
Protein, ur: NEGATIVE mg/dL
Specific Gravity, Urine: 1.015 (ref 1.005–1.030)
pH: 5 (ref 5.0–8.0)

## 2020-07-27 LAB — DIFFERENTIAL
Abs Immature Granulocytes: 0.07 10*3/uL (ref 0.00–0.07)
Basophils Absolute: 0.1 10*3/uL (ref 0.0–0.1)
Basophils Relative: 1 %
Eosinophils Absolute: 0 10*3/uL (ref 0.0–0.5)
Eosinophils Relative: 0 %
Immature Granulocytes: 1 %
Lymphocytes Relative: 20 %
Lymphs Abs: 2.7 10*3/uL (ref 0.7–4.0)
Monocytes Absolute: 1.5 10*3/uL — ABNORMAL HIGH (ref 0.1–1.0)
Monocytes Relative: 11 %
Neutro Abs: 9.4 10*3/uL — ABNORMAL HIGH (ref 1.7–7.7)
Neutrophils Relative %: 67 %

## 2020-07-27 LAB — RESPIRATORY PANEL BY RT PCR (FLU A&B, COVID)
Influenza A by PCR: NEGATIVE
Influenza B by PCR: NEGATIVE
SARS Coronavirus 2 by RT PCR: NEGATIVE

## 2020-07-27 LAB — ETHANOL: Alcohol, Ethyl (B): <10 mg/dL (ref <10)

## 2020-07-27 LAB — I-STAT CHEM 8, ED
BUN: 65 mg/dL — ABNORMAL HIGH (ref 6–20)
Calcium, Ion: 1.24 mmol/L (ref 1.15–1.40)
Chloride: 110 mmol/L (ref 98–111)
Creatinine, Ser: 8.7 mg/dL — ABNORMAL HIGH (ref 0.61–1.24)
Glucose, Bld: 120 mg/dL — ABNORMAL HIGH (ref 70–99)
HCT: 37 % — ABNORMAL LOW (ref 39.0–52.0)
Hemoglobin: 12.6 g/dL — ABNORMAL LOW (ref 13.0–17.0)
Potassium: 5.1 mmol/L (ref 3.5–5.1)
Sodium: 136 mmol/L (ref 135–145)
TCO2: 14 mmol/L — ABNORMAL LOW (ref 22–32)

## 2020-07-27 LAB — PROTIME-INR
INR: 1.5 — ABNORMAL HIGH (ref 0.8–1.2)
Prothrombin Time: 17.8 seconds — ABNORMAL HIGH (ref 11.4–15.2)

## 2020-07-27 LAB — COMPREHENSIVE METABOLIC PANEL
ALT: 49 U/L — ABNORMAL HIGH (ref 0–44)
AST: 101 U/L — ABNORMAL HIGH (ref 15–41)
Albumin: 4 g/dL (ref 3.5–5.0)
Alkaline Phosphatase: 106 U/L (ref 38–126)
Anion gap: 20 — ABNORMAL HIGH (ref 5–15)
BUN: 73 mg/dL — ABNORMAL HIGH (ref 6–20)
CO2: 12 mmol/L — ABNORMAL LOW (ref 22–32)
Calcium: 10.7 mg/dL — ABNORMAL HIGH (ref 8.9–10.3)
Chloride: 103 mmol/L (ref 98–111)
Creatinine, Ser: 8.38 mg/dL — ABNORMAL HIGH (ref 0.61–1.24)
GFR, Estimated: 7 mL/min — ABNORMAL LOW (ref >60)
Glucose, Bld: 125 mg/dL — ABNORMAL HIGH (ref 70–99)
Potassium: 5.1 mmol/L (ref 3.5–5.1)
Sodium: 135 mmol/L (ref 135–145)
Total Bilirubin: 0.9 mg/dL (ref 0.3–1.2)
Total Protein: 9.1 g/dL — ABNORMAL HIGH (ref 6.5–8.1)

## 2020-07-27 LAB — AMMONIA: Ammonia: 94 umol/L — ABNORMAL HIGH (ref 9–35)

## 2020-07-27 LAB — CK: Total CK: 2722 U/L — ABNORMAL HIGH (ref 49–397)

## 2020-07-27 LAB — CBG MONITORING, ED: Glucose-Capillary: 123 mg/dL — ABNORMAL HIGH (ref 70–99)

## 2020-07-27 LAB — APTT: aPTT: 40 seconds — ABNORMAL HIGH (ref 24–36)

## 2020-07-27 MED ORDER — LORAZEPAM 1 MG PO TABS: 0.0000 mg | ORAL_TABLET | Freq: Four times a day (QID) | ORAL | Status: DC

## 2020-07-27 MED ORDER — LORAZEPAM 1 MG PO TABS: 0.0000 mg | ORAL_TABLET | Freq: Two times a day (BID) | ORAL | Status: DC

## 2020-07-27 MED ORDER — SODIUM CHLORIDE 0.9% FLUSH
3.0000 mL | Freq: Once | INTRAVENOUS | Status: AC
  Administered 2020-07-27: 3 mL via INTRAVENOUS

## 2020-07-27 MED ORDER — THIAMINE HCL 100 MG/ML IJ SOLN
100.0000 mg | Freq: Every day | INTRAMUSCULAR | Status: DC
  Filled 2020-07-27: qty 2

## 2020-07-27 MED ORDER — LORAZEPAM 2 MG/ML IJ SOLN: 0.0000 mg | Freq: Two times a day (BID) | INTRAMUSCULAR | Status: DC

## 2020-07-27 MED ORDER — THIAMINE HCL 100 MG PO TABS: 100.0000 mg | ORAL_TABLET | Freq: Every day | ORAL | Status: DC

## 2020-07-27 MED ORDER — LACTATED RINGERS IV BOLUS
1000.0000 mL | Freq: Once | INTRAVENOUS | Status: AC
  Administered 2020-07-27: 1000 mL via INTRAVENOUS

## 2020-07-27 MED ORDER — LORAZEPAM 2 MG/ML IJ SOLN
2.0000 mg | Freq: Once | INTRAMUSCULAR | Status: AC
  Administered 2020-07-27: 2 mg via INTRAVENOUS
  Filled 2020-07-27: qty 1

## 2020-07-27 MED ORDER — LORAZEPAM 2 MG/ML IJ SOLN: 0.0000 mg | Freq: Four times a day (QID) | INTRAMUSCULAR | Status: DC

## 2020-07-27 NOTE — ED Notes (Signed)
CK will be added on by lab

## 2020-07-27 NOTE — ED Triage Notes (Signed)
Patient brought in for altered mental status. Patient was found today at 1900 walking down the street with his pants around his ankles and confused. Family states that when  Has not been seen by family for two days, last known normal was two days ago but neighbors say patient has been apparently confused for longer than that. Patient alert and oriented at this time, no focal deficits. Patient speaking in complete sentences and breathing without difficulty. Family states patient had a stroke earlier this year.

## 2020-07-27 NOTE — ED Provider Notes (Signed)
[facility] Provider Note   CSN: [id number] Arrival date & time: 07/27/20  1936  LEVEL 5 CAVEAT - ALTERED MENTAL STATUS  History Chief Complaint  Patient presents with  . Altered Mental Status    Calvin [name] is a 58 y.o. male.  HPI 59 year old male presents with altered mental status.  History is taken primarily from the cousin at the bedside.  People been trying to call him for the last couple days but no one to been able to get in touch.  He was found near his apartment but wandering with his pants down near his ankles.  Seem like he was having a hard time walking.  She was concerned he was having a stroke and brought him here.  She knows this is similar to when he was admitted a few months ago.  Patient tells me he typically drinks 2 beers per day but has not had any today.  Past Medical History:  Diagnosis Date  . Ascites   . Chronic left hip pain   . Cirrhosis ([place])   . Hypertension   . Stroke ([place])   . TIA (transient ischemic attack) 06/23/2016    Patient Active Problem List   Diagnosis Date Noted  . ARF (acute renal failure) ([place]) 07/27/2020  . AKI (acute kidney injury) ([place]) 04/02/2020  . Hyperkalemia 04/02/2020  . Hallucinations 04/02/2020  . Anemia of chronic disease 04/02/2020  . Chronic hepatitis C without hepatic coma ([place]) 08/09/2019  . Ascites due to alcoholic cirrhosis ([place])   . SOB (shortness of breath)   . Decompensated hepatic cirrhosis ([place]) 07/15/2019  . Essential hypertension 06/23/2016  . Alcohol abuse 06/23/2016  . Chronic left hip pain 06/23/2016  . Hyponatremia 06/23/2016  . Hyperlipidemia   . Blind right eye     Past Surgical History:  Procedure Laterality Date  . BACK SURGERY    . CERVICAL SPINE SURGERY    . JOINT REPLACEMENT         No family history on file.  Social History   Tobacco Use  . Smoking status: Current Every Day Smoker    Types: Cigarettes  . Smokeless tobacco: Never  Used  . Tobacco comment: 1/2 pack per day  Substance Use Topics  . Alcohol use: Yes    Comment: 2 beers/day  . Drug use: No    Home Medications Prior to Admission medications   Medication Sig Start Date End Date Taking? Authorizing Provider  allopurinol (ZYLOPRIM) 100 MG tablet Take 100 mg by mouth daily.  03/27/20  Yes [provider]  furosemide (LASIX) 20 MG tablet Take 20 mg by mouth daily. 02/14/20  Yes [provider]  gabapentin (NEURONTIN) 100 MG capsule Take 100-300 mg by mouth See admin instructions. 100 mg in the morning, 300 mg at bedtime 03/23/20  Yes [provider]  Oxycodone HCl 10 MG TABS Take 10 mg by mouth 4 (four) times daily as needed.   Yes [provider]  shark liver oil-cocoa butter (PREPARATION H) 0.25-88.44 % suppository Place 1 suppository rectally as needed for hemorrhoids.   Yes [provider]  spironolactone (ALDACTONE) 100 MG tablet Take 100 mg by mouth daily.   Yes [provider]  folic acid (FOLVITE) 1 MG tablet Take 1 mg by mouth daily. 02/14/20   [provider]  lactulose (CHRONULAC) 10 GM/15ML solution Take 30 mLs (20 g total) by mouth 2 (two) times daily. 04/07/20   Elgergawy, Silver Huguenin, MD  nicotine (NICODERM CQ - DOSED IN MG/24 HOURS) 21 mg/24hr patch Place 1 patch (21 mg total) onto the skin daily. 04/08/20   Elgergawy, Silver Huguenin, MD  pantoprazole (PROTONIX) 40 MG tablet Take 40 mg by mouth daily. 01/26/20   [provider]  propranolol (INDERAL) 10 MG tablet Take 1 tablet (10 mg total) by mouth 2 (two) times daily. 04/07/20   Elgergawy, Silver Huguenin, MD  thiamine 100 MG tablet Take 1 tablet (100 mg total) by mouth daily. 04/08/20   Elgergawy, Silver Huguenin, MD  tiZANidine (ZANAFLEX) 4 MG tablet Take 4 mg by mouth 3 (three) times daily as needed for muscle spasms.  03/13/20   [provider]  Vitamin D, Ergocalciferol, (DRISDOL) 1.25 MG (50000 UNIT) CAPS capsule Take 50,000 Units by mouth  every Friday. 03/17/20   [provider]    Allergies    Patient has no known allergies.  Review of Systems   Review of Systems  Unable to perform ROS: Mental status change    Physical Exam Updated Vital Signs BP (!) 125/94   Pulse (!) 119   Temp 97.6 F (36.4 C) (Rectal)   Resp 20   Ht 5\' 7"  (1.702 m)   Wt 79.4 kg   SpO2 99%   BMI 27.41 kg/m   Physical Exam Vitals and nursing note reviewed.  Constitutional:      Appearance: He is well-developed. He is not diaphoretic.  HENT:     Head: Normocephalic and atraumatic.     Right Ear: External ear normal.     Left Ear: External ear normal.     Nose: Nose normal.  Eyes:     General:        Right eye: No discharge.        Left eye: No discharge.     Extraocular Movements: Extraocular movements intact.  Cardiovascular:     Rate and Rhythm: Regular rhythm. Tachycardia present.     Heart sounds: Normal heart sounds.  Pulmonary:     Effort: Pulmonary effort is normal.     Breath sounds: Normal breath sounds.  Abdominal:     Palpations: Abdomen is soft.     Tenderness: There is no abdominal tenderness.  Musculoskeletal:     Cervical back: Normal range of motion and neck supple. No rigidity.  Skin:    General: Skin is warm and dry.  Neurological:     Mental Status: He is alert. He is disoriented.     Comments: CN 3-12 grossly intact. 5/5 strength in all 4 extremities. Grossly normal sensation. Difficulty and overshooting with finger to nose. tremulous  Psychiatric:        Mood and Affect: Mood is not anxious.     ED Results / Procedures / Treatments   Labs (all labs ordered are listed, but only abnormal results are displayed) Labs Reviewed  PROTIME-INR - Abnormal; Notable for the following components:      Result Value   Prothrombin Time 17.8 (*)    INR 1.5 (*)    All other components within normal limits  APTT - Abnormal; Notable for the following components:   aPTT 40 (*)    All other components  within normal limits  CBC - Abnormal; Notable for the following components:   WBC 13.9 (*)    RBC 3.56 (*)    Hemoglobin 11.4 (*)    HCT 33.7 (*)    nRBC 0.4 (*)    All other components within normal limits  DIFFERENTIAL - Abnormal; Notable for the following components:   Neutro Abs 9.4 (*)    Monocytes Absolute 1.5 (*)    All other components within normal limits  COMPREHENSIVE METABOLIC PANEL - Abnormal; Notable for the following components:   CO2 12 (*)    Glucose, Bld 125 (*)    BUN 73 (*)    Creatinine, Ser 8.38 (*)    Calcium 10.7 (*)    Total Protein 9.1 (*)    AST 101 (*)    ALT 49 (*)    GFR, Estimated 7 (*)    Anion gap 20 (*)    All other components within normal limits  AMMONIA - Abnormal; Notable for the following components:   Ammonia 94 (*)    All other components within normal limits  I-STAT CHEM 8, ED - Abnormal; Notable for the following components:   BUN 65 (*)    Creatinine, Ser 8.70 (*)    Glucose, Bld 120 (*)    TCO2 14 (*)    Hemoglobin 12.6 (*)    HCT 37.0 (*)    All other components within normal limits  CBG MONITORING, ED - Abnormal; Notable for the following components:   Glucose-Capillary 123 (*)    All other components within normal limits  RESPIRATORY PANEL BY RT PCR (FLU A&B, COVID)  ETHANOL  URINALYSIS, ROUTINE W REFLEX MICROSCOPIC  CK    EKG EKG Interpretation  Date/Time:  Friday July 27 2020 21:33:41 EDT Ventricular Rate:  119 PR Interval:    QRS Duration: 69 QT Interval:  346 QTC Calculation: 487 R Axis:   64 Text Interpretation: Sinus tachycardia Borderline repolarization abnormality Borderline prolonged QT interval rate is faster compared to July 2021 Confirmed by Sherwood Gambler [id number]) on 07/27/2020 9:38:03 PM   Radiology CT HEAD WO CONTRAST  Result Date: 07/27/2020 CLINICAL DATA:  Altered mental status. EXAM: CT HEAD WITHOUT CONTRAST TECHNIQUE: Contiguous axial images were obtained from the base of the skull  through the vertex without intravenous contrast. COMPARISON:  Head CT 04/02/2020 FINDINGS: Brain: Generalized atrophy, stable from prior but advanced for age. No intracranial hemorrhage, mass effect, or midline shift. No hydrocephalus. The basilar cisterns are patent. No evidence of territorial infarct or acute ischemia. No extra-axial or intracranial fluid collection. Vascular: Atherosclerosis of skullbase vasculature without hyperdense vessel or abnormal calcification. Skull: No fracture or focal lesion. Sinuses/Orbits: Remote right orbital fracture. Chronic right globe calcification. Portions of the paranasal sinuses are obscured by motion artifact. No acute findings. Other: None. IMPRESSION: 1. No acute intracranial abnormality. 2. Generalized atrophy, stable but advanced for age. Electronically Signed   By: Keith Rake M.D.   On: 07/27/2020 20:49   DG Chest Portable 1 View  Result Date: 07/27/2020 CLINICAL DATA:  Altered mental status. EXAM: PORTABLE CHEST 1 VIEW.  Patient is rotated. COMPARISON:  Chest x-ray 04/11/2020, CT chest 02/15/2018 FINDINGS: The heart size and mediastinal contours are within normal limits. No focal consolidation. No pulmonary edema. No pleural effusion. No pneumothorax. No acute osseous abnormality. IMPRESSION: No active disease. Electronically Signed   By: Iven Finn M.D.   On: 07/27/2020 22:45    Procedures .Critical Care Performed by: Sherwood Gambler, MD Authorized by: Sherwood Gambler, MD   Critical care provider statement:    Critical care time (minutes):  35   Critical care time was exclusive of:  Separately billable procedures and treating other patients   Critical care was necessary to treat or prevent imminent or life-threatening deterioration of  the following conditions:  CNS failure or compromise   Critical care was time spent personally by me on the following activities:  Discussions with consultants, evaluation of patient's response to treatment,  examination of patient, ordering and performing treatments and interventions, ordering and review of laboratory studies, ordering and review of radiographic studies, pulse oximetry, re-evaluation of patient's condition, obtaining history from patient or surrogate and review of old charts   (including critical care time)  Medications Ordered in ED Medications  LORazepam (ATIVAN) injection 0-4 mg (0 mg Intravenous Hold 07/27/20 2258)    Or  LORazepam (ATIVAN) tablet 0-4 mg ( Oral See Alternative 07/27/20 2258)  LORazepam (ATIVAN) injection 0-4 mg (has no administration in time range)    Or  LORazepam (ATIVAN) tablet 0-4 mg (has no administration in time range)  thiamine tablet 100 mg (has no administration in time range)    Or  thiamine (B-1) injection 100 mg (has no administration in time range)  sodium chloride flush (NS) 0.9 % injection 3 mL (3 mLs Intravenous Given 07/27/20 2144)  lactated ringers bolus 1,000 mL (0 mLs Intravenous Stopped 07/27/20 2258)  LORazepam (ATIVAN) injection 2 mg (2 mg Intravenous Given 07/27/20 2154)  LORazepam (ATIVAN) injection 2 mg (2 mg Intravenous Given 07/27/20 2232)    ED Course  I have reviewed the triage vital signs and the nursing notes.  Pertinent labs & imaging results that were available during my care of the patient were reviewed by me and considered in my medical decision making (see chart for details).    MDM Rules/Calculators/A&P                          Patient is a acutely altered.  He is noted to have significant acute kidney injury.  Unclear exact cause.  He does not have urinary retention on a bladder scan.  He was given IV fluids.  It also appears like he is in alcohol withdrawal with his altered mental status, tachycardia, tremors and elevated CIWA.  He was given IV Ativan.  CT head has been reviewed and is unremarkable.  Altered mental state could be multifactorial including uremia and alcohol withdrawal.  His ammonia is also elevated  though he is not particularly lethargic.  He is afebrile. Doubt CNS infection. Will admit to hospitalist service, Dr. Hal Hope.  Final Clinical Impression(s) / ED Diagnoses Final diagnoses:  Metabolic encephalopathy  Alcohol withdrawal syndrome, with delirium ([place])  Acute kidney injury [facility])    Rx / DC Orders ED Discharge Orders    None       Sherwood Gambler, MD 07/27/20 2316

## 2020-07-28 ENCOUNTER — Inpatient Hospital Stay (HOSPITAL_COMMUNITY): Payer: Medicare Other

## 2020-07-28 ENCOUNTER — Encounter (HOSPITAL_COMMUNITY): Payer: Self-pay | Admitting: Internal Medicine

## 2020-07-28 DIAGNOSIS — N179 Acute kidney failure, unspecified: Secondary | ICD-10-CM | POA: Diagnosis not present

## 2020-07-28 DIAGNOSIS — G9341 Metabolic encephalopathy: Secondary | ICD-10-CM | POA: Diagnosis present

## 2020-07-28 DIAGNOSIS — I10 Essential (primary) hypertension: Secondary | ICD-10-CM

## 2020-07-28 LAB — CBC WITH DIFFERENTIAL/PLATELET
Abs Immature Granulocytes: 0.04 10*3/uL (ref 0.00–0.07)
Basophils Absolute: 0.1 10*3/uL (ref 0.0–0.1)
Basophils Relative: 1 %
Eosinophils Absolute: 0 10*3/uL (ref 0.0–0.5)
Eosinophils Relative: 0 %
HCT: 30.5 % — ABNORMAL LOW (ref 39.0–52.0)
Hemoglobin: 10.3 g/dL — ABNORMAL LOW (ref 13.0–17.0)
Immature Granulocytes: 0 %
Lymphocytes Relative: 23 %
Lymphs Abs: 2.6 10*3/uL (ref 0.7–4.0)
MCH: 32.1 pg (ref 26.0–34.0)
MCHC: 33.8 g/dL (ref 30.0–36.0)
MCV: 95 fL (ref 80.0–100.0)
Monocytes Absolute: 1 10*3/uL (ref 0.1–1.0)
Monocytes Relative: 8 %
Neutro Abs: 7.8 10*3/uL — ABNORMAL HIGH (ref 1.7–7.7)
Neutrophils Relative %: 68 %
Platelets: 248 10*3/uL (ref 150–400)
RBC: 3.21 MIL/uL — ABNORMAL LOW (ref 4.22–5.81)
RDW: 15.1 % (ref 11.5–15.5)
WBC: 11.5 10*3/uL — ABNORMAL HIGH (ref 4.0–10.5)
nRBC: 0.3 % — ABNORMAL HIGH (ref 0.0–0.2)

## 2020-07-28 LAB — BASIC METABOLIC PANEL
Anion gap: 16 — ABNORMAL HIGH (ref 5–15)
Anion gap: 13 (ref 5–15)
BUN: 69 mg/dL — ABNORMAL HIGH (ref 6–20)
BUN: 66 mg/dL — ABNORMAL HIGH (ref 6–20)
CO2: 14 mmol/L — ABNORMAL LOW (ref 22–32)
CO2: 15 mmol/L — ABNORMAL LOW (ref 22–32)
Calcium: 9.5 mg/dL (ref 8.9–10.3)
Calcium: 9.7 mg/dL (ref 8.9–10.3)
Chloride: 104 mmol/L (ref 98–111)
Chloride: 106 mmol/L (ref 98–111)
Creatinine, Ser: 6.51 mg/dL — ABNORMAL HIGH (ref 0.61–1.24)
Creatinine, Ser: 5.35 mg/dL — ABNORMAL HIGH (ref 0.61–1.24)
GFR, Estimated: 9 mL/min — ABNORMAL LOW (ref >60)
GFR, Estimated: 12 mL/min — ABNORMAL LOW (ref >60)
Glucose, Bld: 133 mg/dL — ABNORMAL HIGH (ref 70–99)
Glucose, Bld: 114 mg/dL — ABNORMAL HIGH (ref 70–99)
Potassium: 5 mmol/L (ref 3.5–5.1)
Potassium: 5.1 mmol/L (ref 3.5–5.1)
Sodium: 134 mmol/L — ABNORMAL LOW (ref 135–145)
Sodium: 134 mmol/L — ABNORMAL LOW (ref 135–145)

## 2020-07-28 LAB — I-STAT ARTERIAL BLOOD GAS, ED
Acid-base deficit: 8 mmol/L — ABNORMAL HIGH (ref 0.0–2.0)
Bicarbonate: 17.8 mmol/L — ABNORMAL LOW (ref 20.0–28.0)
Calcium, Ion: 1.37 mmol/L (ref 1.15–1.40)
HCT: 32 % — ABNORMAL LOW (ref 39.0–52.0)
Hemoglobin: 10.9 g/dL — ABNORMAL LOW (ref 13.0–17.0)
O2 Saturation: 96 %
Patient temperature: 97.6
Potassium: 5.3 mmol/L — ABNORMAL HIGH (ref 3.5–5.1)
Sodium: 136 mmol/L (ref 135–145)
TCO2: 19 mmol/L — ABNORMAL LOW (ref 22–32)
pCO2 arterial: 34.1 mmHg (ref 32.0–48.0)
pH, Arterial: 7.322 — ABNORMAL LOW (ref 7.350–7.450)
pO2, Arterial: 85 mmHg (ref 83.0–108.0)

## 2020-07-28 LAB — CBC
HCT: 30.6 % — ABNORMAL LOW (ref 39.0–52.0)
Hemoglobin: 10.4 g/dL — ABNORMAL LOW (ref 13.0–17.0)
MCH: 32.6 pg (ref 26.0–34.0)
MCHC: 34 g/dL (ref 30.0–36.0)
MCV: 95.9 fL (ref 80.0–100.0)
Platelets: 264 10*3/uL (ref 150–400)
RBC: 3.19 MIL/uL — ABNORMAL LOW (ref 4.22–5.81)
RDW: 15 % (ref 11.5–15.5)
WBC: 14.6 10*3/uL — ABNORMAL HIGH (ref 4.0–10.5)
nRBC: 0.1 % (ref 0.0–0.2)

## 2020-07-28 LAB — HEPATIC FUNCTION PANEL
ALT: 41 U/L (ref 0–44)
AST: 82 U/L — ABNORMAL HIGH (ref 15–41)
Albumin: 3.2 g/dL — ABNORMAL LOW (ref 3.5–5.0)
Alkaline Phosphatase: 87 U/L (ref 38–126)
Bilirubin, Direct: 0.3 mg/dL — ABNORMAL HIGH (ref 0.0–0.2)
Indirect Bilirubin: 0.4 mg/dL (ref 0.3–0.9)
Total Bilirubin: 0.7 mg/dL (ref 0.3–1.2)
Total Protein: 7.6 g/dL (ref 6.5–8.1)

## 2020-07-28 LAB — HIV ANTIBODY (ROUTINE TESTING W REFLEX): HIV Screen 4th Generation wRfx: NONREACTIVE

## 2020-07-28 LAB — VOLATILES,BLD-ACETONE,ETHANOL,ISOPROP,METHANOL
Acetone, blood: <0.01 g/dL (ref 0.000–0.010)
Ethanol, blood: <0.01 g/dL (ref 0.000–0.010)
Isopropanol, blood: <0.01 g/dL (ref 0.000–0.010)
Methanol, blood: <0.01 g/dL (ref 0.000–0.010)

## 2020-07-28 LAB — LACTIC ACID, PLASMA
Lactic Acid, Venous: 1.7 mmol/L (ref 0.5–1.9)
Lactic Acid, Venous: 1.2 mmol/L (ref 0.5–1.9)

## 2020-07-28 LAB — CBG MONITORING, ED
Glucose-Capillary: 132 mg/dL — ABNORMAL HIGH (ref 70–99)
Glucose-Capillary: 132 mg/dL — ABNORMAL HIGH (ref 70–99)
Glucose-Capillary: 127 mg/dL — ABNORMAL HIGH (ref 70–99)

## 2020-07-28 LAB — RAPID URINE DRUG SCREEN, HOSP PERFORMED
Amphetamines: NOT DETECTED
Barbiturates: NOT DETECTED
Benzodiazepines: NOT DETECTED
Cocaine: NOT DETECTED
Opiates: POSITIVE — AB
Tetrahydrocannabinol: NOT DETECTED

## 2020-07-28 LAB — IRON AND TIBC
Iron: 57 ug/dL (ref 45–182)
Saturation Ratios: 21 % (ref 17.9–39.5)
TIBC: 270 ug/dL (ref 250–450)
UIBC: 213 ug/dL

## 2020-07-28 LAB — AMMONIA: Ammonia: 81 umol/L — ABNORMAL HIGH (ref 9–35)

## 2020-07-28 LAB — PROTIME-INR
INR: 1.4 — ABNORMAL HIGH (ref 0.8–1.2)
Prothrombin Time: 16.9 seconds — ABNORMAL HIGH (ref 11.4–15.2)

## 2020-07-28 LAB — SODIUM, URINE, RANDOM: Sodium, Ur: 40 mmol/L

## 2020-07-28 LAB — RETICULOCYTES
Immature Retic Fract: 25.3 % — ABNORMAL HIGH (ref 2.3–15.9)
RBC.: 3.19 MIL/uL — ABNORMAL LOW (ref 4.22–5.81)
Retic Count, Absolute: 40.8 10*3/uL (ref 19.0–186.0)
Retic Ct Pct: 1.3 % (ref 0.4–3.1)

## 2020-07-28 LAB — GLUCOSE, CAPILLARY
Glucose-Capillary: 94 mg/dL (ref 70–99)
Glucose-Capillary: 90 mg/dL (ref 70–99)

## 2020-07-28 LAB — TSH: TSH: 1.569 u[IU]/mL (ref 0.350–4.500)

## 2020-07-28 LAB — CK: Total CK: 2017 U/L — ABNORMAL HIGH (ref 49–397)

## 2020-07-28 LAB — FERRITIN: Ferritin: 540 ng/mL — ABNORMAL HIGH (ref 24–336)

## 2020-07-28 LAB — VITAMIN B12: Vitamin B-12: 377 pg/mL (ref 180–914)

## 2020-07-28 LAB — CREATININE, SERUM
Creatinine, Ser: 7.27 mg/dL — ABNORMAL HIGH (ref 0.61–1.24)
GFR, Estimated: 8 mL/min — ABNORMAL LOW (ref >60)

## 2020-07-28 LAB — TROPONIN I (HIGH SENSITIVITY)
Troponin I (High Sensitivity): 5 ng/L (ref <18)
Troponin I (High Sensitivity): 6 ng/L (ref <18)

## 2020-07-28 LAB — CREATININE, URINE, RANDOM: Creatinine, Urine: 328.32 mg/dL

## 2020-07-28 LAB — FOLATE: Folate: 33.4 ng/mL (ref >5.9)

## 2020-07-28 LAB — ETHYLENE GLYCOL: Ethylene Glycol Lvl: <5 mg/dL

## 2020-07-28 MED ORDER — LACTULOSE ENEMA: 300.0000 mL | Freq: Two times a day (BID) | ORAL | Status: DC

## 2020-07-28 MED ORDER — ADULT MULTIVITAMIN W/MINERALS CH
1.0000 | ORAL_TABLET | Freq: Every day | ORAL | Status: DC
  Administered 2020-07-30 – 2020-08-09 (×10): 1 via ORAL
  Filled 2020-07-28 (×10): qty 1

## 2020-07-28 MED ORDER — LACTULOSE ENEMA
300.0000 mL | Freq: Two times a day (BID) | ORAL | Status: DC
  Administered 2020-07-28 – 2020-07-29 (×3): 300 mL via RECTAL
  Filled 2020-07-28 (×4): qty 300

## 2020-07-28 MED ORDER — SODIUM CHLORIDE 0.9 % IV BOLUS
1000.0000 mL | Freq: Once | INTRAVENOUS | Status: AC
  Administered 2020-07-28: 1000 mL via INTRAVENOUS

## 2020-07-28 MED ORDER — LACTULOSE 10 GM/15ML PO SOLN: 20.0000 g | Freq: Three times a day (TID) | ORAL | Status: DC

## 2020-07-28 MED ORDER — LORAZEPAM 2 MG/ML IJ SOLN
1.0000 mg | INTRAMUSCULAR | Status: DC | PRN
  Administered 2020-07-29: 2 mg via INTRAVENOUS
  Administered 2020-07-29: 4 mg via INTRAVENOUS
  Administered 2020-07-29: 3 mg via INTRAVENOUS
  Administered 2020-07-29: 4 mg via INTRAVENOUS
  Administered 2020-07-29 – 2020-07-30 (×4): 2 mg via INTRAVENOUS
  Filled 2020-07-28: qty 2
  Filled 2020-07-28 (×3): qty 1
  Filled 2020-07-28: qty 2
  Filled 2020-07-28 (×2): qty 1

## 2020-07-28 MED ORDER — HEPARIN SODIUM (PORCINE) 5000 UNIT/ML IJ SOLN
5000.0000 [IU] | Freq: Three times a day (TID) | INTRAMUSCULAR | Status: DC
  Administered 2020-07-28 – 2020-08-09 (×35): 5000 [IU] via SUBCUTANEOUS
  Filled 2020-07-28 (×35): qty 1

## 2020-07-28 MED ORDER — LACTULOSE ENEMA
300.0000 mL | Freq: Once | ORAL | Status: AC
  Administered 2020-07-28: 300 mL via RECTAL
  Filled 2020-07-28: qty 300

## 2020-07-28 MED ORDER — LORAZEPAM 1 MG PO TABS
1.0000 mg | ORAL_TABLET | ORAL | Status: DC | PRN
  Filled 2020-07-28: qty 4

## 2020-07-28 MED ORDER — SODIUM CHLORIDE 0.9 % IV SOLN: INTRAVENOUS | Status: DC

## 2020-07-28 MED ORDER — STERILE WATER FOR INJECTION IV SOLN
INTRAVENOUS | Status: DC
  Filled 2020-07-28 (×8): qty 850

## 2020-07-28 NOTE — ED Notes (Signed)
Pt sodium and creatinine will be added on to urine

## 2020-07-28 NOTE — ED Notes (Signed)
Pt had large brown watery BM

## 2020-07-28 NOTE — ED Notes (Signed)
Patient transported to MRI 

## 2020-07-28 NOTE — ED Notes (Signed)
Insertion of lactulose completed at this time. Pt placed on LT side for retention of enema for 60 min. Pt has chuck pads and a brief on.

## 2020-07-28 NOTE — Progress Notes (Addendum)
PROGRESS NOTE    Calvin [name]  FGH:[id number] DOB: [birth year] DOA: 07/27/2020 PCP: Jenny Reichmann, PA-C   Chief Complain: Confused, wondering  Brief Narrative: Patient is a 59 year old male with history of hepatitis C, chronic alcohol abuse, liver cirrhosis was found to be wandering around the road by his family and was brought to the emergency department.  As per the family, he was found to be confused since last few days.  On presentation, he was not alert or oriented.  CT head, MRI of the brain done in the emergency department denies any acute intracranial abnormalities.  Patient was found to have severely elevated ammonia level, severe AKI with creatinine in the range of 8.  CK level was elevated.  Patient was admitted for the management of hepatic encephalopathy along with severe AKI.  Assessment & Plan:   Principal Problem:   ARF (acute renal failure) (HCC) Active Problems:   Essential hypertension   Decompensated hepatic cirrhosis (HCC)   Chronic hepatitis C without hepatic coma (HCC)   Acute kidney injury on CKD stage II: Baseline creatinine fluctuating between 1-2.8 in the last year..  Presented with creatinine in the range of 8.  Kidney function improved with IV fluids.  This is most likely from acute tubular necrosis from rhabdomyolysis.  Urine sodium is 40.  CT renal study did not show any hydronephrosis or obstructive pathology but showed punctuate nonobstructing stone in the lower right kidney. Patient also has an metabolic acidosis.  Started on bicarb drip. Avoid nephrotoxins.  Check BMP tomorrow. Negative screening for Methyl alcohol, ethyleneglycol, acetone, isopropyl alcohol..  Since kidney function improving, nephrology consultation not requested yet. We will continue monitoring of strict input and output  Acute encephalopathy: Most likely this is from  encephalopathy from elevated ammonia level but could be also from uremia and also could be from Ativan that  was given in the emergency department.  CT head/MRI of the brain did not show any acute intracranial abnormalities.  No report of seizures.  Since patient remains confused, continue lactulose enema.  He had a large bowel movement this afternoon.  If there is no further improvement in the ammonia level, will put NG tube for lactulose. We will check UDS  History of liver cirrhosis: Secondary to alcohol abuse and hepatitis C.  Mild elevated AST and ALT but this could be from elevated CK.  Normocytic anemia: Continue to monitor CBC.  Currently hemoglobin stable.  Chronic ethanol abuse: Agitated on presentation.  Given 4 mg of Ativan IV in the emergency department.  Continue to monitor CIWA score.  Continue thiamine folic acid.         DVT prophylaxis:Heparin [place] Code Status: Full Family Communication: Called wife and daughter on phone, calls not received Status is: Inpatient  Remains inpatient appropriate because:Inpatient level of care appropriate due to severity of illness   Dispo: The patient is from: Home              Anticipated d/c is to: Home              Anticipated d/c date is: 2 days              Patient currently is not medically stable to d/c.      Consultants: None  Procedures:None  Antimicrobials:  Anti-infectives (From admission, onward)   None      Subjective: Patient seen and examined at the bedside this morning.  He was in sinus tachycardia.  He was not  alert or oriented but open his eyes when calling his name.  Did not follow any commands.  Not in distress.   Objective: Vitals:   07/28/20 1230 07/28/20 1245 07/28/20 1320 07/28/20 1321  BP: 137/83 138/80 134/78 134/78  Pulse: (!) 120 (!) 121 (!) 119 (!) 118  Resp: (!) 25 (!) 23 (!) 25   Temp:      TempSrc:      SpO2: 97% 100% 99%   Weight:      Height:        Intake/Output Summary (Last 24 hours) at 07/28/2020 1340 Last data filed at 07/28/2020 1011 Gross per 24 hour  Intake 2650 ml  Output --   Net 2650 ml   Filed Weights   07/27/20 2101  Weight: 79.4 kg    Examination:  General exam: Deconditioned, debilitated, unresponsive  HEENT:eyes closed Respiratory system: Bilateral equal air entry, normal vesicular breath sounds, no wheezes or crackles  Cardiovascular system: Sinus tachycardia. No JVD, murmurs, rubs, gallops or clicks. No pedal edema. Gastrointestinal system: Abdomen is mildly distended, soft and nontender. No organomegaly or masses felt. Normal bowel sounds heard. Central nervous system: Not alert and oriented.  Extremities: No edema, no clubbing ,no cyanosis, Skin: No rashes, lesions or ulcers,no icterus ,no pallor  Data Reviewed: I have personally reviewed following labs and imaging studies  CBC: Recent Labs  Lab 07/27/20 1956 07/27/20 2036 07/28/20 0033 07/28/20 0042 07/28/20 0214  WBC 13.9*  --  14.6*  --  11.5*  NEUTROABS 9.4*  --   --   --  7.8*  HGB 11.4* 12.6* 10.4* 10.9* 10.3*  HCT 33.7* 37.0* 30.6* 32.0* 30.5*  MCV 94.7  --  95.9  --  95.0  PLT 299  --  264  --  229   Basic Metabolic Panel: Recent Labs  Lab 07/27/20 1956 07/27/20 2036 07/28/20 0033 07/28/20 0042 07/28/20 0214 07/28/20 0731  NA 135 136  --  136 134* 134*  K 5.1 5.1  --  5.3* 5.0 5.1  CL 103 110  --   --  104 106  CO2 12*  --   --   --  14* 15*  GLUCOSE 125* 120*  --   --  133* 114*  BUN 73* 65*  --   --  69* 66*  CREATININE 8.38* 8.70* 7.27*  --  6.51* 5.35*  CALCIUM 10.7*  --   --   --  9.5 9.7   GFR: Estimated Creatinine Clearance: 14.8 mL/min (A) (by C-G formula based on SCr of 5.35 mg/dL (H)). Liver Function Tests: Recent Labs  Lab 07/27/20 1956 07/28/20 0214  AST 101* 82*  ALT 49* 41  ALKPHOS 106 87  BILITOT 0.9 0.7  PROT 9.1* 7.6  ALBUMIN 4.0 3.2*   No results for input(s): LIPASE, AMYLASE in the last 168 hours. Recent Labs  Lab 07/27/20 2129 07/28/20 0731  AMMONIA 94* 81*   Coagulation Profile: Recent Labs  Lab 07/27/20 1956  07/28/20 0214  INR 1.5* 1.4*   Cardiac Enzymes: Recent Labs  Lab 07/27/20 1956 07/28/20 0731  CKTOTAL 2,722* 2,017*   BNP (last 3 results) No results for input(s): PROBNP in the last 8760 hours. HbA1C: No results for input(s): HGBA1C in the last 72 hours. CBG: Recent Labs  Lab 07/27/20 2058 07/28/20 0315 07/28/20 0424 07/28/20 0854  GLUCAP 123* 132* 132* 127*   Lipid Profile: No results for input(s): CHOL, HDL, LDLCALC, TRIG, CHOLHDL, LDLDIRECT in the last 72 hours. Thyroid  Function Tests: Recent Labs    07/28/20 0317  TSH 1.569   Anemia Panel: Recent Labs    07/28/20 0033  VITAMINB12 377  FOLATE 33.4  FERRITIN 540*  TIBC 270  IRON 57  RETICCTPCT 1.3   Sepsis Labs: Recent Labs  Lab 07/28/20 0033 07/28/20 0314  LATICACIDVEN 1.7 1.2    Recent Results (from the past 240 hour(s))  Respiratory Panel by RT PCR (Flu A&B, Covid) - Nasopharyngeal Swab     Status: None   Collection Time: 07/27/20  9:29 PM   Specimen: Nasopharyngeal Swab  Result Value Ref Range Status   SARS Coronavirus 2 by RT PCR NEGATIVE NEGATIVE Final    Comment: (NOTE) SARS-CoV-2 target nucleic acids are NOT DETECTED.  The SARS-CoV-2 RNA is generally detectable in upper respiratoy specimens during the acute phase of infection. The lowest concentration of SARS-CoV-2 viral copies this assay can detect is 131 copies/mL. A negative result does not preclude SARS-Cov-2 infection and should not be used as the sole basis for treatment or other patient management decisions. A negative result may occur with  improper specimen collection/handling, submission of specimen other than nasopharyngeal swab, presence of viral mutation(s) within the areas targeted by this assay, and inadequate number of viral copies (<131 copies/mL). A negative result must be combined with clinical observations, patient history, and epidemiological information. The expected result is Negative.  Fact Sheet for Patients:   PinkCheek.be  Fact Sheet for Healthcare Providers:  GravelBags.it  This test is no t yet approved or cleared by the [place] FDA and  has been authorized for detection and/or diagnosis of SARS-CoV-2 by FDA under an Emergency Use Authorization (EUA). This EUA will remain  in effect (meaning this test can be used) for the duration of the COVID-19 declaration under Section 564(b)(1) of the Act, 21 U.S.C. section 360bbb-3(b)(1), unless the authorization is terminated or revoked sooner.     Influenza A by PCR NEGATIVE NEGATIVE Final   Influenza B by PCR NEGATIVE NEGATIVE Final    Comment: (NOTE) The Xpert Xpress SARS-CoV-2/FLU/RSV assay is intended as an aid in  the diagnosis of influenza from Nasopharyngeal swab specimens and  should not be used as a sole basis for treatment. Nasal washings and  aspirates are unacceptable for Xpert Xpress SARS-CoV-2/FLU/RSV  testing.  Fact Sheet for Patients: PinkCheek.be  Fact Sheet for Healthcare Providers: GravelBags.it  This test is not yet approved or cleared by the [place] FDA and  has been authorized for detection and/or diagnosis of SARS-CoV-2 by  FDA under an Emergency Use Authorization (EUA). This EUA will remain  in effect (meaning this test can be used) for the duration of the  Covid-19 declaration under Section 564(b)(1) of the Act, 21  U.S.C. section 360bbb-3(b)(1), unless the authorization is  terminated or revoked. Performed at [facility], [place] [address], [place], [place] [address]          Radiology Studies: CT HEAD WO CONTRAST  Result Date: 07/27/2020 CLINICAL DATA:  Altered mental status. EXAM: CT HEAD WITHOUT CONTRAST TECHNIQUE: Contiguous axial images were obtained from the base of the skull through the vertex without intravenous contrast. COMPARISON:  Head CT 04/02/2020 FINDINGS:  Brain: Generalized atrophy, stable from prior but advanced for age. No intracranial hemorrhage, mass effect, or midline shift. No hydrocephalus. The basilar cisterns are patent. No evidence of territorial infarct or acute ischemia. No extra-axial or intracranial fluid collection. Vascular: Atherosclerosis of skullbase vasculature without hyperdense vessel or abnormal calcification.  Skull: No fracture or focal lesion. Sinuses/Orbits: Remote right orbital fracture. Chronic right globe calcification. Portions of the paranasal sinuses are obscured by motion artifact. No acute findings. Other: None. IMPRESSION: 1. No acute intracranial abnormality. 2. Generalized atrophy, stable but advanced for age. Electronically Signed   By: Keith Rake M.D.   On: 07/27/2020 20:49   MR BRAIN WO CONTRAST  Result Date: 07/28/2020 CLINICAL DATA:  Initial evaluation for acute altered mental status. EXAM: MRI HEAD WITHOUT CONTRAST TECHNIQUE: Multiplanar, multiecho pulse sequences of the brain and surrounding structures were obtained without intravenous contrast. COMPARISON:  Prior CT from 07/27/2020 as well as previous brain MRI from 06/23/2016. FINDINGS: Brain: Examination mildly degraded by motion artifact. Generalized age-related cerebral atrophy. Patchy T2/FLAIR hyperintensity within the periventricular deep white matter most consistent with chronic small vessel ischemic disease, mild in nature. No abnormal foci of restricted diffusion to suggest acute or subacute ischemia. Gray-white matter differentiation maintained. No encephalomalacia to suggest chronic cortical infarction. No findings to suggest acute or chronic intracranial hemorrhage. No mass lesion, midline shift or mass effect. No hydrocephalus or extra-axial fluid collection. Pituitary gland suprasellar region normal. Midline structures intact. Vascular: Major intracranial vascular flow voids are maintained. Hypoplastic right vertebral artery noted. Skull and upper  cervical spine: Craniocervical junction within normal limits. Bone marrow signal intensity normal. No scalp soft tissue abnormality. Sinuses/Orbits: Chronic changes including lens dislocation at the right globe noted. Chronic right fracture of the right lamina papyracea. Globes and orbital soft tissues demonstrate no acute finding. Right frontal sinus retention cyst. Paranasal sinuses are otherwise clear. Small right mastoid effusion, of doubtful significance. Other: None. IMPRESSION: 1. No acute intracranial abnormality. 2. Age-related cerebral atrophy with mild chronic small vessel ischemic disease. Electronically Signed   By: Jeannine Boga M.D.   On: 07/28/2020 03:23   DG Chest Portable 1 View  Result Date: 07/27/2020 CLINICAL DATA:  Altered mental status. EXAM: PORTABLE CHEST 1 VIEW.  Patient is rotated. COMPARISON:  Chest x-ray 04/11/2020, CT chest 02/15/2018 FINDINGS: The heart size and mediastinal contours are within normal limits. No focal consolidation. No pulmonary edema. No pleural effusion. No pneumothorax. No acute osseous abnormality. IMPRESSION: No active disease. Electronically Signed   By: Iven Finn M.D.   On: 07/27/2020 22:45   CT RENAL STONE STUDY  Result Date: 07/27/2020 CLINICAL DATA:  Acute renal failure.  Confusion. EXAM: CT ABDOMEN AND PELVIS WITHOUT CONTRAST TECHNIQUE: Multidetector CT imaging of the abdomen and pelvis was performed following the standard protocol without IV contrast. COMPARISON:  CT 07/15/2019 FINDINGS: Lower chest: Normal heart size with coronary artery calcifications. No pleural fluid. Motion artifact limitations. Bilateral gynecomastia. Hepatobiliary: Nodular hepatic contours. No obvious focal hepatic abnormality on noncontrast exam, lower liver partially obscured by motion. There is an 18 mm lamellated gallstone. No pericholecystic inflammation. Common bile duct is not well-defined, there is no evidence of biliary dilatation. Pancreas: No ductal  dilatation or inflammation. Spleen: Normal in size without focal abnormality.  No splenomegaly. Adrenals/Urinary Tract: No adrenal nodule. Punctate nonobstructing stone in the lower right kidney. No hydronephrosis. No perinephric edema. No left renal stone, hydronephrosis or perinephric stranding both ureters are decompressed. Urinary bladder is minimally distended, partially obscured by streak artifact from left hip arthroplasty. Stomach/Bowel: Fluid distended stomach. No small bowel dilatation or obstruction. Short segment of small bowel loops extend into a small right inguinal hernia. No associated wall thickening. The appendix is normal. Moderate volume of stool throughout the colon. Sigmoid colon is tortuous and courses  into the upper abdomen. No abnormal rectal distention. Vascular/Lymphatic: Aorto bi-iliac atherosclerosis without aneurysm. No bulky abdominopelvic adenopathy. There may be a few prominent periportal nodes that are motion obscured. Reproductive: Prostate is unremarkable. Other: Right inguinal hernia contains fat and short segment of noninflamed nonobstructed small bowel. There is minimal fat in the left inguinal canal. Small fat containing umbilical hernia no significant ascites. No free air. Musculoskeletal: Remote ununited right posterior eleventh rib fracture. Left hip arthroplasty. 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 There is lower lumbar degenerative disc disease. IMPRESSION: 1. No hydronephrosis or obstructive uropathy. Punctate nonobstructing stone in the lower right kidney. 2. Right inguinal hernia contains fat and short segment of noninflamed small bowel. No bowel obstruction or inflammation. 3. Nodular hepatic contours suspicious for cirrhosis. 4. Cholelithiasis without gallbladder inflammation. Aortic Atherosclerosis (ICD10-I70.0). Electronically Signed   By: Keith Rake M.D.   On:  07/27/2020 23:15        Scheduled Meds: . heparin  5,000 Units Subcutaneous Q8H  . lactulose  300 mL Rectal BID   Continuous Infusions: .  sodium bicarbonate (isotonic) infusion in sterile water 125 mL/hr at 07/28/20 1013     LOS: 1 day    Time spent: 35 mins.More than 50% of that time was spent in counseling and/or coordination of care.      Shelly Coss, MD [facility] P11/02/2020, 1:40 PM

## 2020-07-28 NOTE — Plan of Care (Signed)

## 2020-07-28 NOTE — ED Notes (Signed)
Cleaned pt and applied clean bedding and brief.

## 2020-07-28 NOTE — H&P (Addendum)
History and Physical    Calvin [name] WGN:[id number] DOB: [birth year] DOA: 07/27/2020  PCP: Jenny Reichmann, PA-C  Patient coming from: Home.  Chief Complaint: Confusion and wandering on the road.  Most of the history was obtained from the ER physician as I was unable to reach the patient's family.  Patient is confused.  HPI: Calvin [name] is a 59 y.o. male with known history of hepatitis C alcohol abuse and cirrhosis was found to be wandering on the road by the patient's family.  As per the family who reported to the EMS patient was confused last few days.  ED Course: In the ER patient was confused mildly agitated was given Ativan.  CT head is unremarkable.  Labs are significant for acute renal failure and hyper ammonia.  Creatinine has increased from 1.3 in July 2021 and it is around 8.3.  AST is around 101 and ALT 49.  CK levels around 2700.  UA shows hyaline casts but no crystals.  Anion gap is around 20.  Alcohol levels are negative.  CBC shows mild leukocytosis with hemoglobin 11.4 which is around the baseline.  CT head was unremarkable CT renal study does not show any obstruction.  Patient was started on fluids admitted to acute renal failure and acute metabolic encephalopathy.  Covid test is negative.  By the time I examined the patient patient was only given Ativan so he was mildly sedated and was trying to resist my exam.  Review of Systems: As per HPI, rest all negative.   Past Medical History:  Diagnosis Date  . Ascites   . Chronic left hip pain   . Cirrhosis ([place])   . Hypertension   . Stroke ([place])   . TIA (transient ischemic attack) 06/23/2016    Past Surgical History:  Procedure Laterality Date  . BACK SURGERY    . CERVICAL SPINE SURGERY    . JOINT REPLACEMENT       reports that he has been smoking cigarettes. He has never used smokeless tobacco. He reports current alcohol use. He reports that he does not use drugs.  No Known Allergies  Family History    Family history unknown: Yes    Prior to Admission medications   Medication Sig Start Date End Date Taking? Authorizing Provider  allopurinol (ZYLOPRIM) 100 MG tablet Take 100 mg by mouth daily.  03/27/20  Yes [provider]  furosemide (LASIX) 20 MG tablet Take 20 mg by mouth daily. 02/14/20  Yes [provider]  gabapentin (NEURONTIN) 100 MG capsule Take 100-300 mg by mouth See admin instructions. 100 mg in the morning, 300 mg at bedtime 03/23/20  Yes [provider]  Oxycodone HCl 10 MG TABS Take 10 mg by mouth 4 (four) times daily as needed.   Yes [provider]  shark liver oil-cocoa butter (PREPARATION H) 0.25-88.44 % suppository Place 1 suppository rectally as needed for hemorrhoids.   Yes [provider]  spironolactone (ALDACTONE) 100 MG tablet Take 100 mg by mouth daily.   Yes [provider]  folic acid (FOLVITE) 1 MG tablet Take 1 mg by mouth daily. 02/14/20   [provider]  lactulose (CHRONULAC) 10 GM/15ML solution Take 30 mLs (20 g total) by mouth 2 (two) times daily. 04/07/20   Elgergawy, Silver Huguenin, MD  nicotine (NICODERM CQ - DOSED IN MG/24 HOURS) 21 mg/24hr patch Place 1 patch (21 mg total) onto the skin daily. 04/08/20   Elgergawy, Silver Huguenin, MD  pantoprazole (PROTONIX) 40 MG tablet Take 40 mg by mouth daily. 01/26/20   [provider]  propranolol (INDERAL) 10 MG tablet Take 1 tablet (10 mg total) by mouth 2 (two) times daily. 04/07/20   Elgergawy, Silver Huguenin, MD  thiamine 100 MG tablet Take 1 tablet (100 mg total) by mouth daily. 04/08/20   Elgergawy, Silver Huguenin, MD  tiZANidine (ZANAFLEX) 4 MG tablet Take 4 mg by mouth 3 (three) times daily as needed for muscle spasms.  03/13/20   [provider]  Vitamin D, Ergocalciferol, (DRISDOL) 1.25 MG (50000 UNIT) CAPS capsule Take 50,000 Units by mouth every Friday. 03/17/20   [provider]    Physical Exam: Constitutional: Moderately built and  nourished. Vitals:   07/27/20 2230 07/27/20 2245 07/27/20 2315 07/27/20 2330  BP: (!) 125/94 138/85 128/70 108/63  Pulse: (!) 119 (!) 122 (!) 115 (!) 115  Resp: 20 18 18 15   Temp:      TempSrc:      SpO2: 99% 94% 99% 99%  Weight:      Height:       Eyes: Anicteric no pallor. ENMT: No discharge from the ears eyes nose or mouth. Neck: No mass felt.  No neck rigidity. Respiratory: No rhonchi or crepitations. Cardiovascular: S1-S2 heard. Abdomen: Soft nontender bowel sounds present. Musculoskeletal: No edema. Skin: No rash. Neurologic: Patient is lethargic and is trying to resist my exam.  Pupils are reacting to light.  No neck rigidity. Psychiatric: Lethargic and resisting my exam.   Labs on Admission: I have personally reviewed following labs and imaging studies  CBC: Recent Labs  Lab 07/27/20 1956 07/27/20 2036  WBC 13.9*  --   NEUTROABS 9.4*  --   HGB 11.4* 12.6*  HCT 33.7* 37.0*  MCV 94.7  --   PLT 299  --    Basic Metabolic Panel: Recent Labs  Lab 07/27/20 1956 07/27/20 2036  NA 135 136  K 5.1 5.1  CL 103 110  CO2 12*  --   GLUCOSE 125* 120*  BUN 73* 65*  CREATININE 8.38* 8.70*  CALCIUM 10.7*  --    GFR: Estimated Creatinine Clearance: 9.1 mL/min (A) (by C-G formula based on SCr of 8.7 mg/dL (H)). Liver Function Tests: Recent Labs  Lab 07/27/20 1956  AST 101*  ALT 49*  ALKPHOS 106  BILITOT 0.9  PROT 9.1*  ALBUMIN 4.0   No results for input(s): LIPASE, AMYLASE in the last 168 hours. Recent Labs  Lab 07/27/20 2129  AMMONIA 94*   Coagulation Profile: Recent Labs  Lab 07/27/20 1956  INR 1.5*   Cardiac Enzymes: Recent Labs  Lab 07/27/20 1956  CKTOTAL 2,722*   BNP (last 3 results) No results for input(s): PROBNP in the last 8760 hours. HbA1C: No results for input(s): HGBA1C in the last 72 hours. CBG: Recent Labs  Lab 07/27/20 2058  GLUCAP 123*   Lipid Profile: No results for input(s): CHOL, HDL, LDLCALC, TRIG, CHOLHDL,  LDLDIRECT in the last 72 hours. Thyroid Function Tests: No results for input(s): TSH, T4TOTAL, FREET4, T3FREE, THYROIDAB in the last 72 hours. Anemia Panel: No results for input(s): VITAMINB12, FOLATE, FERRITIN, TIBC, IRON, RETICCTPCT in the last 72 hours. Urine analysis:    Component Value Date/Time   COLORURINE AMBER (A) 07/27/2020 2245   APPEARANCEUR HAZY (A) 07/27/2020 2245   LABSPEC 1.015 07/27/2020 2245   PHURINE 5.0 07/27/2020 2245   GLUCOSEU NEGATIVE 07/27/2020 2245   HGBUR MODERATE (A) 07/27/2020 2245   BILIRUBINUR  NEGATIVE 07/27/2020 2245   KETONESUR NEGATIVE 07/27/2020 2245   PROTEINUR NEGATIVE 07/27/2020 2245   UROBILINOGEN 1.0 02/13/2010 1843   NITRITE NEGATIVE 07/27/2020 2245   LEUKOCYTESUR NEGATIVE 07/27/2020 2245   Sepsis Labs: @LABRCNTIP (procalcitonin:4,lacticidven:4) ) Recent Results (from the past 240 hour(s))  Respiratory Panel by RT PCR (Flu A&B, Covid) - Nasopharyngeal Swab     Status: None   Collection Time: 07/27/20  9:29 PM   Specimen: Nasopharyngeal Swab  Result Value Ref Range Status   SARS Coronavirus 2 by RT PCR NEGATIVE NEGATIVE Final    Comment: (NOTE) SARS-CoV-2 target nucleic acids are NOT DETECTED.  The SARS-CoV-2 RNA is generally detectable in upper respiratoy specimens during the acute phase of infection. The lowest concentration of SARS-CoV-2 viral copies this assay can detect is 131 copies/mL. A negative result does not preclude SARS-Cov-2 infection and should not be used as the sole basis for treatment or other patient management decisions. A negative result may occur with  improper specimen collection/handling, submission of specimen other than nasopharyngeal swab, presence of viral mutation(s) within the areas targeted by this assay, and inadequate number of viral copies (<131 copies/mL). A negative result must be combined with clinical observations, patient history, and epidemiological information. The expected result is  Negative.  Fact Sheet for Patients:  PinkCheek.be  Fact Sheet for Healthcare Providers:  GravelBags.it  This test is no t yet approved or cleared by the [place] FDA and  has been authorized for detection and/or diagnosis of SARS-CoV-2 by FDA under an Emergency Use Authorization (EUA). This EUA will remain  in effect (meaning this test can be used) for the duration of the COVID-19 declaration under Section 564(b)(1) of the Act, 21 U.S.C. section 360bbb-3(b)(1), unless the authorization is terminated or revoked sooner.     Influenza A by PCR NEGATIVE NEGATIVE Final   Influenza B by PCR NEGATIVE NEGATIVE Final    Comment: (NOTE) The Xpert Xpress SARS-CoV-2/FLU/RSV assay is intended as an aid in  the diagnosis of influenza from Nasopharyngeal swab specimens and  should not be used as a sole basis for treatment. Nasal washings and  aspirates are unacceptable for Xpert Xpress SARS-CoV-2/FLU/RSV  testing.  Fact Sheet for Patients: PinkCheek.be  Fact Sheet for Healthcare Providers: GravelBags.it  This test is not yet approved or cleared by the [place] FDA and  has been authorized for detection and/or diagnosis of SARS-CoV-2 by  FDA under an Emergency Use Authorization (EUA). This EUA will remain  in effect (meaning this test can be used) for the duration of the  Covid-19 declaration under Section 564(b)(1) of the Act, 21  U.S.C. section 360bbb-3(b)(1), unless the authorization is  terminated or revoked. Performed at [facility], [place] [address] [place], [place] [address]      Radiological Exams on Admission: CT HEAD WO CONTRAST  Result Date: 07/27/2020 CLINICAL DATA:  Altered mental status. EXAM: CT HEAD WITHOUT CONTRAST TECHNIQUE: Contiguous axial images were obtained from the base of the skull through the vertex without intravenous contrast.  COMPARISON:  Head CT 04/02/2020 FINDINGS: Brain: Generalized atrophy, stable from prior but advanced for age. No intracranial hemorrhage, mass effect, or midline shift. No hydrocephalus. The basilar cisterns are patent. No evidence of territorial infarct or acute ischemia. No extra-axial or intracranial fluid collection. Vascular: Atherosclerosis of skullbase vasculature without hyperdense vessel or abnormal calcification. Skull: No fracture or focal lesion. Sinuses/Orbits: Remote right orbital fracture. Chronic right globe calcification. Portions of the paranasal sinuses are obscured by  motion artifact. No acute findings. Other: None. IMPRESSION: 1. No acute intracranial abnormality. 2. Generalized atrophy, stable but advanced for age. Electronically Signed   By: Keith Rake M.D.   On: 07/27/2020 20:49   DG Chest Portable 1 View  Result Date: 07/27/2020 CLINICAL DATA:  Altered mental status. EXAM: PORTABLE CHEST 1 VIEW.  Patient is rotated. COMPARISON:  Chest x-ray 04/11/2020, CT chest 02/15/2018 FINDINGS: The heart size and mediastinal contours are within normal limits. No focal consolidation. No pulmonary edema. No pleural effusion. No pneumothorax. No acute osseous abnormality. IMPRESSION: No active disease. Electronically Signed   By: Iven Finn M.D.   On: 07/27/2020 22:45   CT RENAL STONE STUDY  Result Date: 07/27/2020 CLINICAL DATA:  Acute renal failure.  Confusion. EXAM: CT ABDOMEN AND PELVIS WITHOUT CONTRAST TECHNIQUE: Multidetector CT imaging of the abdomen and pelvis was performed following the standard protocol without IV contrast. COMPARISON:  CT 07/15/2019 FINDINGS: Lower chest: Normal heart size with coronary artery calcifications. No pleural fluid. Motion artifact limitations. Bilateral gynecomastia. Hepatobiliary: Nodular hepatic contours. No obvious focal hepatic abnormality on noncontrast exam, lower liver partially obscured by motion. There is an 18 mm lamellated gallstone. No  pericholecystic inflammation. Common bile duct is not well-defined, there is no evidence of biliary dilatation. Pancreas: No ductal dilatation or inflammation. Spleen: Normal in size without focal abnormality.  No splenomegaly. Adrenals/Urinary Tract: No adrenal nodule. Punctate nonobstructing stone in the lower right kidney. No hydronephrosis. No perinephric edema. No left renal stone, hydronephrosis or perinephric stranding both ureters are decompressed. Urinary bladder is minimally distended, partially obscured by streak artifact from left hip arthroplasty. Stomach/Bowel: Fluid distended stomach. No small bowel dilatation or obstruction. Short segment of small bowel loops extend into a small right inguinal hernia. No associated wall thickening. The appendix is normal. Moderate volume of stool throughout the colon. Sigmoid colon is tortuous and courses into the upper abdomen. No abnormal rectal distention. Vascular/Lymphatic: Aorto bi-iliac atherosclerosis without aneurysm. No bulky abdominopelvic adenopathy. There may be a few prominent periportal nodes that are motion obscured. Reproductive: Prostate is unremarkable. Other: Right inguinal hernia contains fat and short segment of noninflamed nonobstructed small bowel. There is minimal fat in the left inguinal canal. Small fat containing umbilical hernia no significant ascites. No free air. Musculoskeletal: Remote ununited right posterior eleventh rib fracture. Left hip arthroplasty. 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 There is lower lumbar degenerative disc disease. IMPRESSION: 1. No hydronephrosis or obstructive uropathy. Punctate nonobstructing stone in the lower right kidney. 2. Right inguinal hernia contains fat and short segment of noninflamed small bowel. No bowel obstruction or inflammation. 3. Nodular hepatic contours suspicious for cirrhosis. 4. Cholelithiasis  without gallbladder inflammation. Aortic Atherosclerosis (ICD10-I70.0). Electronically Signed   By: Keith Rake M.D.   On: 07/27/2020 23:15    EKG: Independently reviewed.  Sinus rhythm.  Assessment/Plan Principal Problem:   ARF (acute renal failure) (HCC) Active Problems:   Essential hypertension   Decompensated hepatic cirrhosis (HCC)   Chronic hepatitis C without hepatic coma (HCC)    1. Acute renal failure on chronic kidney disease baseline creatinine is usually around 1.3.  Because of the renal failure is not clear.  Patient at this time clinically looks dehydrated.  We will need to get further history from family.  For now I am hydrating patient will follow metabolic panel intake output check FENa.  I will also send levels for methanol and ethylene  glycol.  If creatinine does not improve with hydration will need nephrology input. 2. Acute encephalopathy could be multifactorial including hyperammonemia and also renal failure.  CT of the head is unremarkable.  No signs of any active seizures or infection at this time.  Will try to give lactulose for hyperammonemia.  Also hydrating for the renal failure.  In addition we will check anemia panel thiamine levels and alcohol levels including ethylene glycol methanol.  MRI brain has been ordered.  Check urine drug screen. 3. History of liver cirrhosis with history of alcohol abuse and hepatitis C.  Appears dehydrated.  Receiving fluids.  AST and ALT is mildly elevated.  Ammonia is also elevated.  Closely monitor. 4. Anemia appears to be chronic.  Follow CBC. 5. Rhabdomyolysis will need to recheck CK levels after hydrating. 6. History of alcohol abuse.  After admitting in the ER patient received 4 mg of IV Ativan following which patient is still sedated.  Will need to reassess after patient becomes more alert awake.  Closely monitor.  Thiamine for now.  I also sent levels of methanol and ethylene glycol.  Since patient has severe acute renal  failure with altered mental status will need close monitoring for any further worsening in inpatient status.  We will need to get further history when family available.   DVT prophylaxis: Heparin. Code Status: Full code. Family Communication: We will need to reach family. Disposition Plan: To be determined. Consults called: None. Admission status: Inpatient.   Rise Patience MD [facility] Pager [phone].  If 7PM-7AM, please contact night-coverage [email or web address] Password [email or web address]  07/28/2020, 12:11 AM

## 2020-07-28 NOTE — ED Notes (Signed)
Pt noted to be coughing up a lot of phlegm and sounding congested. Pt HOB increased and suction to mouth placed.

## 2020-07-29 DIAGNOSIS — N179 Acute kidney failure, unspecified: Secondary | ICD-10-CM | POA: Diagnosis not present

## 2020-07-29 LAB — MAGNESIUM: Magnesium: 2 mg/dL (ref 1.7–2.4)

## 2020-07-29 LAB — BASIC METABOLIC PANEL
Anion gap: 13 (ref 5–15)
BUN: 44 mg/dL — ABNORMAL HIGH (ref 6–20)
CO2: 21 mmol/L — ABNORMAL LOW (ref 22–32)
Calcium: 9.7 mg/dL (ref 8.9–10.3)
Chloride: 106 mmol/L (ref 98–111)
Creatinine, Ser: 2.03 mg/dL — ABNORMAL HIGH (ref 0.61–1.24)
GFR, Estimated: 37 mL/min — ABNORMAL LOW (ref >60)
Glucose, Bld: 99 mg/dL (ref 70–99)
Potassium: 4.3 mmol/L (ref 3.5–5.1)
Sodium: 140 mmol/L (ref 135–145)

## 2020-07-29 LAB — AMMONIA: Ammonia: 44 umol/L — ABNORMAL HIGH (ref 9–35)

## 2020-07-29 LAB — GLUCOSE, CAPILLARY
Glucose-Capillary: 91 mg/dL (ref 70–99)
Glucose-Capillary: 85 mg/dL (ref 70–99)
Glucose-Capillary: 102 mg/dL — ABNORMAL HIGH (ref 70–99)
Glucose-Capillary: 81 mg/dL (ref 70–99)
Glucose-Capillary: 95 mg/dL (ref 70–99)
Glucose-Capillary: 108 mg/dL — ABNORMAL HIGH (ref 70–99)

## 2020-07-29 MED ORDER — PNEUMOCOCCAL VAC POLYVALENT 25 MCG/0.5ML IJ INJ: 0.5000 mL | INJECTION | INTRAMUSCULAR | Status: DC | PRN

## 2020-07-29 MED ORDER — INFLUENZA VAC SPLIT QUAD 0.5 ML IM SUSY: 0.5000 mL | PREFILLED_SYRINGE | INTRAMUSCULAR | Status: DC | PRN

## 2020-07-29 MED ORDER — LACTULOSE 10 GM/15ML PO SOLN
20.0000 g | Freq: Three times a day (TID) | ORAL | Status: DC
  Administered 2020-07-29 – 2020-08-09 (×31): 20 g via ORAL
  Filled 2020-07-29 (×32): qty 30

## 2020-07-29 NOTE — Progress Notes (Signed)
Entered patient room for rounding, patient agitated, had removed urinary device, sitting on edge of bed saying he needed to leave to go to store to get something to eat. Patient was given prn dose of ativan, and became cooperative shortly after. Swallow eval completed at bedside with 90 ml of water. Patient able to swallow entire cup without any coughing or signs of distress. Alerted MD Adhikari to advance diet.

## 2020-07-29 NOTE — Progress Notes (Addendum)
PROGRESS NOTE    Calvin [name]  OQH:[id number] DOB: Apr 22, 1961 DOA: 07/27/2020 PCP: Jenny Reichmann, PA-C   Chief Complain: Confused, wondering  Brief Narrative: Patient is a 59 year old male with history of hepatitis C, chronic alcohol abuse, liver cirrhosis was found to be wandering around the road by his family and was brought to the emergency department.  As per the family, he was found to be confused since last few days.  On presentation, he was not alert or oriented.  CT head, MRI of the brain done in the emergency department denies any acute intracranial abnormalities.  Patient was found to have severely elevated ammonia level, severe AKI with creatinine in the range of 8.  CK level was elevated.  Patient was admitted for the management of hepatic encephalopathy along with severe AKI.   Assessment & Plan:   Principal Problem:   ARF (acute renal failure) (HCC) Active Problems:   Essential hypertension   Decompensated hepatic cirrhosis (HCC)   Chronic hepatitis C without hepatic coma (HCC)   Acute kidney injury on CKD stage II: Baseline creatinine fluctuating between 1-2.8 in the last year..  Presented with creatinine in the range of 8.  This is most likely from acute tubular necrosis from rhabdomyolysis.  Urine sodium was 40.  CT renal study did not show any hydronephrosis or obstructive pathology but showed punctuate nonobstructing stone in the lower right kidney. Patient also had an metabolic acidosis.  Started on bicarb drip. Negative screening for Methyl alcohol, ethyleneglycol, acetone, isopropyl alcohol. Continued improvement in the kidney function with IV fluids. We will continue monitoring of strict input and output  Acute encephalopathy: Most likely this is from  encephalopathy from elevated ammonia level but could be also from uremia and also could be from Ativan that was given in the emergency department.  CT head/MRI of the brain did not show any acute  intracranial abnormalities.  No report of seizures.  Mental status has improved and he is currently alert, awake and oriented to place.   UDS positive for opiates.  History of liver cirrhosis: Secondary to alcohol abuse and hepatitis C.  Mild elevated AST and ALT but this could be from elevated CK.  Normocytic anemia: Continue to monitor CBC.  Currently hemoglobin stable.  Chronic ethanol abuse: Agitated on presentation.   Continue to monitor CIWA score.  Continue thiamine ,folic acid.  Sinus tachycardia:Most likely secondary to alcohol withdrawal.  Continue to monitor         DVT prophylaxis:Heparin Riddle Code Status: Full Family Communication: Called wife  on phone, call not received Status is: Inpatient  Remains inpatient appropriate because:Inpatient level of care appropriate due to severity of illness   Dispo: The patient is from: Home              Anticipated d/c is to: Home              Anticipated d/c date is: 2 days              Patient currently is not medically stable to d/c.      Consultants: None  Procedures:None  Antimicrobials:  Anti-infectives (From admission, onward)   None      Subjective: Patient seen and examined the bedside this morning.  Hemodynamically stable.  He is much more alert and awake today and was asking for food.  Still on sinus tachycardia.   Objective: Vitals:   07/29/20 0300 07/29/20 0340 07/29/20 0400 07/29/20 0800  BP: 123/67 118/67  103/61 131/74  Pulse: (!) 119   (!) 116  Resp: 20 18 (!) 22 14  Temp:  98.2 F (36.8 C)  98 F (36.7 C)  TempSrc:  Oral  Oral  SpO2: 96% 98%  98%  Weight:      Height:        Intake/Output Summary (Last 24 hours) at 07/29/2020 0838 Last data filed at 07/29/2020 0300 Gross per 24 hour  Intake 900 ml  Output 1150 ml  Net -250 ml   Filed Weights   07/27/20 2101  Weight: 79.4 kg    Examination:  General exam: Overall comfortable Respiratory system: Bilateral equal air entry, normal  vesicular breath sounds, no wheezes or crackles  Cardiovascular system: S1 & S2 heard, RRR. No JVD, murmurs, rubs, gallops or clicks. Gastrointestinal system: Abdomen is nondistended, soft and nontender. No organomegaly or masses felt. Normal bowel sounds heard. Central nervous system: Alert and awake and oriented to place only Extremities: No edema, no clubbing ,no cyanosis Skin: No rashes, lesions or ulcers,no icterus ,no pallor  Data Reviewed: I have personally reviewed following labs and imaging studies  CBC: Recent Labs  Lab 07/27/20 1956 07/27/20 2036 07/28/20 0033 07/28/20 0042 07/28/20 0214  WBC 13.9*  --  14.6*  --  11.5*  NEUTROABS 9.4*  --   --   --  7.8*  HGB 11.4* 12.6* 10.4* 10.9* 10.3*  HCT 33.7* 37.0* 30.6* 32.0* 30.5*  MCV 94.7  --  95.9  --  95.0  PLT 299  --  264  --  277   Basic Metabolic Panel: Recent Labs  Lab 07/27/20 1956 07/27/20 2036 07/28/20 0033 07/28/20 0042 07/28/20 0214 07/28/20 0731  NA 135 136  --  136 134* 134*  K 5.1 5.1  --  5.3* 5.0 5.1  CL 103 110  --   --  104 106  CO2 12*  --   --   --  14* 15*  GLUCOSE 125* 120*  --   --  133* 114*  BUN 73* 65*  --   --  69* 66*  CREATININE 8.38* 8.70* 7.27*  --  6.51* 5.35*  CALCIUM 10.7*  --   --   --  9.5 9.7   GFR: Estimated Creatinine Clearance: 14.8 mL/min (A) (by C-G formula based on SCr of 5.35 mg/dL (H)). Liver Function Tests: Recent Labs  Lab 07/27/20 1956 07/28/20 0214  AST 101* 82*  ALT 49* 41  ALKPHOS 106 87  BILITOT 0.9 0.7  PROT 9.1* 7.6  ALBUMIN 4.0 3.2*   No results for input(s): LIPASE, AMYLASE in the last 168 hours. Recent Labs  Lab 07/27/20 2129 07/28/20 0731  AMMONIA 94* 81*   Coagulation Profile: Recent Labs  Lab 07/27/20 1956 07/28/20 0214  INR 1.5* 1.4*   Cardiac Enzymes: Recent Labs  Lab 07/27/20 1956 07/28/20 0731  CKTOTAL 2,722* 2,017*   BNP (last 3 results) No results for input(s): PROBNP in the last 8760 hours. HbA1C: No results for  input(s): HGBA1C in the last 72 hours. CBG: Recent Labs  Lab 07/28/20 0854 07/28/20 1932 07/28/20 2304 07/29/20 0302 07/29/20 0757  GLUCAP 127* 94 90 91 85   Lipid Profile: No results for input(s): CHOL, HDL, LDLCALC, TRIG, CHOLHDL, LDLDIRECT in the last 72 hours. Thyroid Function Tests: Recent Labs    07/28/20 0317  TSH 1.569   Anemia Panel: Recent Labs    07/28/20 0033  VITAMINB12 377  FOLATE 33.4  FERRITIN 540*  TIBC 270  IRON 57  RETICCTPCT 1.3   Sepsis Labs: Recent Labs  Lab 07/28/20 0033 07/28/20 0314  LATICACIDVEN 1.7 1.2    Recent Results (from the past 240 hour(s))  Respiratory Panel by RT PCR (Flu A&B, Covid) - Nasopharyngeal Swab     Status: None   Collection Time: 07/27/20  9:29 PM   Specimen: Nasopharyngeal Swab  Result Value Ref Range Status   SARS Coronavirus 2 by RT PCR NEGATIVE NEGATIVE Final    Comment: (NOTE) SARS-CoV-2 target nucleic acids are NOT DETECTED.  The SARS-CoV-2 RNA is generally detectable in upper respiratoy specimens during the acute phase of infection. The lowest concentration of SARS-CoV-2 viral copies this assay can detect is 131 copies/mL. A negative result does not preclude SARS-Cov-2 infection and should not be used as the sole basis for treatment or other patient management decisions. A negative result may occur with  improper specimen collection/handling, submission of specimen other than nasopharyngeal swab, presence of viral mutation(s) within the areas targeted by this assay, and inadequate number of viral copies (<131 copies/mL). A negative result must be combined with clinical observations, patient history, and epidemiological information. The expected result is Negative.  Fact Sheet for Patients:  PinkCheek.be  Fact Sheet for Healthcare Providers:  GravelBags.it  This test is no t yet approved or cleared by the [place] FDA and  has been  authorized for detection and/or diagnosis of SARS-CoV-2 by FDA under an Emergency Use Authorization (EUA). This EUA will remain  in effect (meaning this test can be used) for the duration of the COVID-19 declaration under Section 564(b)(1) of the Act, 21 U.S.C. section 360bbb-3(b)(1), unless the authorization is terminated or revoked sooner.     Influenza A by PCR NEGATIVE NEGATIVE Final   Influenza B by PCR NEGATIVE NEGATIVE Final    Comment: (NOTE) The Xpert Xpress SARS-CoV-2/FLU/RSV assay is intended as an aid in  the diagnosis of influenza from Nasopharyngeal swab specimens and  should not be used as a sole basis for treatment. Nasal washings and  aspirates are unacceptable for Xpert Xpress SARS-CoV-2/FLU/RSV  testing.  Fact Sheet for Patients: PinkCheek.be  Fact Sheet for Healthcare Providers: GravelBags.it  This test is not yet approved or cleared by the [place] FDA and  has been authorized for detection and/or diagnosis of SARS-CoV-2 by  FDA under an Emergency Use Authorization (EUA). This EUA will remain  in effect (meaning this test can be used) for the duration of the  Covid-19 declaration under Section 564(b)(1) of the Act, 21  U.S.C. section 360bbb-3(b)(1), unless the authorization is  terminated or revoked. Performed at [facility], [place] [address], [place], [place] [address]          Radiology Studies: CT HEAD WO CONTRAST  Result Date: 07/27/2020 CLINICAL DATA:  Altered mental status. EXAM: CT HEAD WITHOUT CONTRAST TECHNIQUE: Contiguous axial images were obtained from the base of the skull through the vertex without intravenous contrast. COMPARISON:  Head CT 04/02/2020 FINDINGS: Brain: Generalized atrophy, stable from prior but advanced for age. No intracranial hemorrhage, mass effect, or midline shift. No hydrocephalus. The basilar cisterns are patent. No evidence of territorial infarct or  acute ischemia. No extra-axial or intracranial fluid collection. Vascular: Atherosclerosis of skullbase vasculature without hyperdense vessel or abnormal calcification. Skull: No fracture or focal lesion. Sinuses/Orbits: Remote right orbital fracture. Chronic right globe calcification. Portions of the paranasal sinuses are obscured by motion artifact. No acute findings. Other: None. IMPRESSION: 1. No acute intracranial abnormality.  2. Generalized atrophy, stable but advanced for age. Electronically Signed   By: Keith Rake M.D.   On: 07/27/2020 20:49   MR BRAIN WO CONTRAST  Result Date: 07/28/2020 CLINICAL DATA:  Initial evaluation for acute altered mental status. EXAM: MRI HEAD WITHOUT CONTRAST TECHNIQUE: Multiplanar, multiecho pulse sequences of the brain and surrounding structures were obtained without intravenous contrast. COMPARISON:  Prior CT from 07/27/2020 as well as previous brain MRI from 06/23/2016. FINDINGS: Brain: Examination mildly degraded by motion artifact. Generalized age-related cerebral atrophy. Patchy T2/FLAIR hyperintensity within the periventricular deep white matter most consistent with chronic small vessel ischemic disease, mild in nature. No abnormal foci of restricted diffusion to suggest acute or subacute ischemia. Gray-white matter differentiation maintained. No encephalomalacia to suggest chronic cortical infarction. No findings to suggest acute or chronic intracranial hemorrhage. No mass lesion, midline shift or mass effect. No hydrocephalus or extra-axial fluid collection. Pituitary gland suprasellar region normal. Midline structures intact. Vascular: Major intracranial vascular flow voids are maintained. Hypoplastic right vertebral artery noted. Skull and upper cervical spine: Craniocervical junction within normal limits. Bone marrow signal intensity normal. No scalp soft tissue abnormality. Sinuses/Orbits: Chronic changes including lens dislocation at the right globe noted.  Chronic right fracture of the right lamina papyracea. Globes and orbital soft tissues demonstrate no acute finding. Right frontal sinus retention cyst. Paranasal sinuses are otherwise clear. Small right mastoid effusion, of doubtful significance. Other: None. IMPRESSION: 1. No acute intracranial abnormality. 2. Age-related cerebral atrophy with mild chronic small vessel ischemic disease. Electronically Signed   By: Jeannine Boga M.D.   On: 07/28/2020 03:23   DG Chest Portable 1 View  Result Date: 07/27/2020 CLINICAL DATA:  Altered mental status. EXAM: PORTABLE CHEST 1 VIEW.  Patient is rotated. COMPARISON:  Chest x-ray 04/11/2020, CT chest 02/15/2018 FINDINGS: The heart size and mediastinal contours are within normal limits. No focal consolidation. No pulmonary edema. No pleural effusion. No pneumothorax. No acute osseous abnormality. IMPRESSION: No active disease. Electronically Signed   By: Iven Finn M.D.   On: 07/27/2020 22:45   CT RENAL STONE STUDY  Result Date: 07/27/2020 CLINICAL DATA:  Acute renal failure.  Confusion. EXAM: CT ABDOMEN AND PELVIS WITHOUT CONTRAST TECHNIQUE: Multidetector CT imaging of the abdomen and pelvis was performed following the standard protocol without IV contrast. COMPARISON:  CT 07/15/2019 FINDINGS: Lower chest: Normal heart size with coronary artery calcifications. No pleural fluid. Motion artifact limitations. Bilateral gynecomastia. Hepatobiliary: Nodular hepatic contours. No obvious focal hepatic abnormality on noncontrast exam, lower liver partially obscured by motion. There is an 18 mm lamellated gallstone. No pericholecystic inflammation. Common bile duct is not well-defined, there is no evidence of biliary dilatation. Pancreas: No ductal dilatation or inflammation. Spleen: Normal in size without focal abnormality.  No splenomegaly. Adrenals/Urinary Tract: No adrenal nodule. Punctate nonobstructing stone in the lower right kidney. No hydronephrosis. No  perinephric edema. No left renal stone, hydronephrosis or perinephric stranding both ureters are decompressed. Urinary bladder is minimally distended, partially obscured by streak artifact from left hip arthroplasty. Stomach/Bowel: Fluid distended stomach. No small bowel dilatation or obstruction. Short segment of small bowel loops extend into a small right inguinal hernia. No associated wall thickening. The appendix is normal. Moderate volume of stool throughout the colon. Sigmoid colon is tortuous and courses into the upper abdomen. No abnormal rectal distention. Vascular/Lymphatic: Aorto bi-iliac atherosclerosis without aneurysm. No bulky abdominopelvic adenopathy. There may be a few prominent periportal nodes that are motion obscured. Reproductive: Prostate is unremarkable. Other: Right  inguinal hernia contains fat and short segment of noninflamed nonobstructed small bowel. There is minimal fat in the left inguinal canal. Small fat containing umbilical hernia no significant ascites. No free air. Musculoskeletal: Remote ununited right posterior eleventh rib fracture. Left hip arthroplasty. 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 There is lower lumbar degenerative disc disease. IMPRESSION: 1. No hydronephrosis or obstructive uropathy. Punctate nonobstructing stone in the lower right kidney. 2. Right inguinal hernia contains fat and short segment of noninflamed small bowel. No bowel obstruction or inflammation. 3. Nodular hepatic contours suspicious for cirrhosis. 4. Cholelithiasis without gallbladder inflammation. Aortic Atherosclerosis (ICD10-I70.0). Electronically Signed   By: Keith Rake M.D.   On: 07/27/2020 23:15        Scheduled Meds: . heparin  5,000 Units Subcutaneous Q8H  . lactulose  300 mL Rectal BID  . multivitamin with minerals  1 tablet Oral Daily   Continuous Infusions: .  sodium bicarbonate  (isotonic) infusion in sterile water 125 mL/hr at 07/29/20 0343     LOS: 2 days    Time spent: 35 mins.More than 50% of that time was spent in counseling and/or coordination of care.      Shelly Coss, MD [facility] P11/03/2020, 8:38 AM

## 2020-07-29 NOTE — Plan of Care (Signed)

## 2020-07-30 ENCOUNTER — Inpatient Hospital Stay (HOSPITAL_COMMUNITY): Payer: Medicare Other

## 2020-07-30 DIAGNOSIS — G9341 Metabolic encephalopathy: Secondary | ICD-10-CM

## 2020-07-30 DIAGNOSIS — N179 Acute kidney failure, unspecified: Secondary | ICD-10-CM | POA: Diagnosis not present

## 2020-07-30 LAB — CBC WITH DIFFERENTIAL/PLATELET
Abs Immature Granulocytes: 0.04 10*3/uL (ref 0.00–0.07)
Basophils Absolute: 0.1 10*3/uL (ref 0.0–0.1)
Basophils Relative: 1 %
Eosinophils Absolute: 0.1 10*3/uL (ref 0.0–0.5)
Eosinophils Relative: 1 %
HCT: 32.5 % — ABNORMAL LOW (ref 39.0–52.0)
Hemoglobin: 11.1 g/dL — ABNORMAL LOW (ref 13.0–17.0)
Immature Granulocytes: 0 %
Lymphocytes Relative: 33 %
Lymphs Abs: 3.6 10*3/uL (ref 0.7–4.0)
MCH: 32.4 pg (ref 26.0–34.0)
MCHC: 34.2 g/dL (ref 30.0–36.0)
MCV: 94.8 fL (ref 80.0–100.0)
Monocytes Absolute: 1.3 10*3/uL — ABNORMAL HIGH (ref 0.1–1.0)
Monocytes Relative: 12 %
Neutro Abs: 5.6 10*3/uL (ref 1.7–7.7)
Neutrophils Relative %: 53 %
Platelets: 250 10*3/uL (ref 150–400)
RBC: 3.43 MIL/uL — ABNORMAL LOW (ref 4.22–5.81)
RDW: 14.7 % (ref 11.5–15.5)
WBC: 10.8 10*3/uL — ABNORMAL HIGH (ref 4.0–10.5)
nRBC: 0.7 % — ABNORMAL HIGH (ref 0.0–0.2)

## 2020-07-30 LAB — GLUCOSE, CAPILLARY
Glucose-Capillary: 101 mg/dL — ABNORMAL HIGH (ref 70–99)
Glucose-Capillary: 101 mg/dL — ABNORMAL HIGH (ref 70–99)
Glucose-Capillary: 108 mg/dL — ABNORMAL HIGH (ref 70–99)
Glucose-Capillary: 113 mg/dL — ABNORMAL HIGH (ref 70–99)
Glucose-Capillary: 89 mg/dL (ref 70–99)
Glucose-Capillary: 95 mg/dL (ref 70–99)
Glucose-Capillary: 99 mg/dL (ref 70–99)

## 2020-07-30 LAB — BASIC METABOLIC PANEL
Anion gap: 13 (ref 5–15)
BUN: 28 mg/dL — ABNORMAL HIGH (ref 6–20)
CO2: 24 mmol/L (ref 22–32)
Calcium: 9.9 mg/dL (ref 8.9–10.3)
Chloride: 106 mmol/L (ref 98–111)
Creatinine, Ser: 1.49 mg/dL — ABNORMAL HIGH (ref 0.61–1.24)
GFR, Estimated: 53 mL/min — ABNORMAL LOW (ref >60)
Glucose, Bld: 112 mg/dL — ABNORMAL HIGH (ref 70–99)
Potassium: 4 mmol/L (ref 3.5–5.1)
Sodium: 143 mmol/L (ref 135–145)

## 2020-07-30 LAB — AMMONIA: Ammonia: 38 umol/L — ABNORMAL HIGH (ref 9–35)

## 2020-07-30 MED ORDER — PROPRANOLOL HCL 10 MG PO TABS
10.0000 mg | ORAL_TABLET | Freq: Two times a day (BID) | ORAL | Status: DC
  Administered 2020-07-30 – 2020-08-09 (×20): 10 mg via ORAL
  Filled 2020-07-30 (×23): qty 1

## 2020-07-30 MED ORDER — FOLIC ACID 1 MG PO TABS
1.0000 mg | ORAL_TABLET | Freq: Every day | ORAL | Status: DC
  Administered 2020-07-30 – 2020-08-09 (×10): 1 mg via ORAL
  Filled 2020-07-30 (×9): qty 1

## 2020-07-30 MED ORDER — OXYCODONE HCL 5 MG PO TABS
5.0000 mg | ORAL_TABLET | Freq: Once | ORAL | Status: AC
  Administered 2020-07-30: 5 mg via ORAL
  Filled 2020-07-30: qty 1

## 2020-07-30 MED ORDER — THIAMINE HCL 100 MG/ML IJ SOLN
500.0000 mg | Freq: Every day | INTRAVENOUS | Status: AC
  Administered 2020-07-30 – 2020-08-01 (×3): 500 mg via INTRAVENOUS
  Filled 2020-07-30 (×4): qty 5

## 2020-07-30 NOTE — Plan of Care (Signed)

## 2020-07-30 NOTE — Progress Notes (Signed)
HOSPITAL MEDICINE OVERNIGHT EVENT NOTE     Patient complaining of severe neck pain, worse with any manipulation by nursing.  Per nursing, patient recently fell prior to admission prompting his neck pain.  Will order 5mg  PO oxycodone x1, order CT Cspine to evaluate for any traumatic injury.    Vernelle Emerald  MD [facility]

## 2020-07-30 NOTE — Progress Notes (Signed)
PROGRESS NOTE    Calvin [name]  PRF:[id number] DOB: 01/15/1961 DOA: 07/27/2020 PCP: Jenny Reichmann, PA-C   Chief Complain: Confused, wondering  Brief Narrative: Patient is a 59 year old male with history of hepatitis C, chronic alcohol abuse, liver cirrhosis was found to be wandering around the road by his family and was brought to the emergency department.  As per the family, he was found to be confused since last few days.  On presentation, he was not alert or oriented.  CT head, MRI of the brain done in the emergency department denies any acute intracranial abnormalities.  Patient was found to have severely elevated ammonia level, severe AKI with creatinine in the range of 8.  CK level was elevated.  Patient was admitted for the management of hepatic encephalopathy along with severe AKI.  Hospital course remarkable for persistent encephalopathy.   Assessment & Plan:   Principal Problem:   ARF (acute renal failure) (HCC) Active Problems:   Essential hypertension   Decompensated hepatic cirrhosis (HCC)   Chronic hepatitis C without hepatic coma (HCC)   Acute kidney injury on CKD stage II: Baseline creatinine fluctuating between 1-2.8 in the last year..  Presented with creatinine in the range of 8.  This is most likely from acute tubular necrosis from rhabdomyolysis.  Urine sodium was 40.  CT renal study did not show any hydronephrosis or obstructive pathology but showed punctuate nonobstructing stone in the lower right kidney. Patient also had an metabolic acidosis.  Started on bicarb drip. Negative screening for Methyl alcohol, ethyleneglycol, acetone, isopropyl alcohol. Continued improvement in the kidney function with IV fluids. We will continue monitoring of strict input and output Fluids discontinued.  The kidney function is at baseline now  Acute encephalopathy: Suspected to be from elevated ammonia level, uremia on presentation.  CT head/MRI of the brain did not show any  acute intracranial abnormalities.  No report of seizures.  Mental status has improved and he is currently alert, awake and but oriented to place only.   UDS positive for opiates. Slow improvement in the mental status, still mildly agitated.  Discussed with wife today and she told that he is a heavy drinker.  This could be related to Wernicke's encephalopathy from thiamine deficiency.  Thiamine level pending. Started thiamine at 500 mg IV daily for 3 days.EEG done today showed moderate diffuse encephalopathy but no seizures or epileptiform discharges  History of liver cirrhosis: Secondary to alcohol abuse and hepatitis C.  Mild elevated AST and ALT but this could be from elevated CK.  Normocytic anemia: Continue to monitor CBC.  Currently hemoglobin stable.  Chronic ethanol abuse: Agitated on presentation.   Continue to monitor CIWA score. Limited benzos as much as possible. Continue thiamine ,folic acid.  Sinus tachycardia:Most likely secondary to alcohol withdrawal.  Continue to monitor         DVT prophylaxis:Heparin [place] Code Status: Full Family Communication: Discussed with wife in person today Status is: Inpatient  Remains inpatient appropriate because:Inpatient level of care appropriate due to severity of illness   Dispo: The patient is from: Home              Anticipated d/c is to: Home              Anticipated d/c date is: 2 days              Patient currently is not medically stable to d/c.  Still encephalopathic    Consultants: None  Procedures: EEG  Antimicrobials:  Anti-infectives (From admission, onward)   None      Subjective:  Patient seen and examined at the bedside this morning.  Hemodynamically stable but in sinus tachycardia.  Awake but little sleepy and mildly agitated.  Does not communicate much.  Oriented to place only.   Objective: Vitals:   07/30/20 0800 07/30/20 0941 07/30/20 1138 07/30/20 1200  BP: (!) 147/96 (!) 128/91 139/90 (!) 149/91    Pulse: (!) 115 (!) 112 93   Resp:  (!) 27 (!) 28   Temp:  98.4 F (36.9 C) 98.1 F (36.7 C)   TempSrc:  Oral Axillary   SpO2:  99% 98%   Weight:      Height:        Intake/Output Summary (Last 24 hours) at 07/30/2020 1428 Last data filed at 07/30/2020 1347 Gross per 24 hour  Intake 633.31 ml  Output 3225 ml  Net -2591.69 ml   Filed Weights   07/27/20 2101  Weight: 79.4 kg    Examination:   General exam: Not in distress, debilitated, deconditioned Respiratory system: Bilateral equal air entry, normal vesicular breath sounds, no wheezes or crackles  Cardiovascular system: Sinus tachycardia, No JVD, murmurs, rubs, gallops or clicks. Gastrointestinal system: Abdomen is mildly distended, soft and nontender. No organomegaly or masses felt. Normal bowel sounds heard. Central nervous system: Alert and awake but oriented to place only  Extremities: No edema, no clubbing ,no cyanosis Skin: No rashes, lesions or ulcers,no icterus ,no pallor   Data Reviewed: I have personally reviewed following labs and imaging studies  CBC: Recent Labs  Lab 07/27/20 1956 07/27/20 1956 07/27/20 2036 07/28/20 0033 07/28/20 0042 07/28/20 0214 07/30/20 0208  WBC 13.9*  --   --  14.6*  --  11.5* 10.8*  NEUTROABS 9.4*  --   --   --   --  7.8* 5.6  HGB 11.4*   < > 12.6* 10.4* 10.9* 10.3* 11.1*  HCT 33.7*   < > 37.0* 30.6* 32.0* 30.5* 32.5*  MCV 94.7  --   --  95.9  --  95.0 94.8  PLT 299  --   --  264  --  248 250   < > = values in this interval not displayed.   Basic Metabolic Panel: Recent Labs  Lab 07/27/20 1956 07/27/20 1956 07/27/20 2036 07/27/20 2036 07/28/20 0033 07/28/20 0042 07/28/20 0214 07/28/20 0731 07/29/20 0931 07/30/20 0208  NA 135   < > 136   < >  --  136 134* 134* 140 143  K 5.1   < > 5.1   < >  --  5.3* 5.0 5.1 4.3 4.0  CL 103   < > 110  --   --   --  104 106 106 106  CO2 12*  --   --   --   --   --  14* 15* 21* 24  GLUCOSE 125*   < > 120*  --   --   --  133*  114* 99 112*  BUN 73*   < > 65*  --   --   --  69* 66* 44* 28*  CREATININE 8.38*   < > 8.70*   < > 7.27*  --  6.51* 5.35* 2.03* 1.49*  CALCIUM 10.7*  --   --   --   --   --  9.5 9.7 9.7 9.9  MG  --   --   --   --   --   --   --   --  2.0  --    < > = values in this interval not displayed.   GFR: Estimated Creatinine Clearance: 53.2 mL/min (A) (by C-G formula based on SCr of 1.49 mg/dL (H)). Liver Function Tests: Recent Labs  Lab 07/27/20 1956 07/28/20 0214  AST 101* 82*  ALT 49* 41  ALKPHOS 106 87  BILITOT 0.9 0.7  PROT 9.1* 7.6  ALBUMIN 4.0 3.2*   No results for input(s): LIPASE, AMYLASE in the last 168 hours. Recent Labs  Lab 07/27/20 2129 07/28/20 0731 07/29/20 0931 07/30/20 0208  AMMONIA 94* 81* 44* 38*   Coagulation Profile: Recent Labs  Lab 07/27/20 1956 07/28/20 0214  INR 1.5* 1.4*   Cardiac Enzymes: Recent Labs  Lab 07/27/20 1956 07/28/20 0731  CKTOTAL 2,722* 2,017*   BNP (last 3 results) No results for input(s): PROBNP in the last 8760 hours. HbA1C: No results for input(s): HGBA1C in the last 72 hours. CBG: Recent Labs  Lab 07/29/20 1917 07/29/20 2308 07/30/20 0312 07/30/20 0734 07/30/20 1153  GLUCAP 95 108* 113* 95 108*   Lipid Profile: No results for input(s): CHOL, HDL, LDLCALC, TRIG, CHOLHDL, LDLDIRECT in the last 72 hours. Thyroid Function Tests: Recent Labs    07/28/20 0317  TSH 1.569   Anemia Panel: Recent Labs    07/28/20 0033  VITAMINB12 377  FOLATE 33.4  FERRITIN 540*  TIBC 270  IRON 57  RETICCTPCT 1.3   Sepsis Labs: Recent Labs  Lab 07/28/20 0033 07/28/20 0314  LATICACIDVEN 1.7 1.2    Recent Results (from the past 240 hour(s))  Respiratory Panel by RT PCR (Flu A&B, Covid) - Nasopharyngeal Swab     Status: None   Collection Time: 07/27/20  9:29 PM   Specimen: Nasopharyngeal Swab  Result Value Ref Range Status   SARS Coronavirus 2 by RT PCR NEGATIVE NEGATIVE Final    Comment: (NOTE) SARS-CoV-2 target nucleic  acids are NOT DETECTED.  The SARS-CoV-2 RNA is generally detectable in upper respiratoy specimens during the acute phase of infection. The lowest concentration of SARS-CoV-2 viral copies this assay can detect is 131 copies/mL. A negative result does not preclude SARS-Cov-2 infection and should not be used as the sole basis for treatment or other patient management decisions. A negative result may occur with  improper specimen collection/handling, submission of specimen other than nasopharyngeal swab, presence of viral mutation(s) within the areas targeted by this assay, and inadequate number of viral copies (<131 copies/mL). A negative result must be combined with clinical observations, patient history, and epidemiological information. The expected result is Negative.  Fact Sheet for Patients:  PinkCheek.be  Fact Sheet for Healthcare Providers:  GravelBags.it  This test is no t yet approved or cleared by the [place] FDA and  has been authorized for detection and/or diagnosis of SARS-CoV-2 by FDA under an Emergency Use Authorization (EUA). This EUA will remain  in effect (meaning this test can be used) for the duration of the COVID-19 declaration under Section 564(b)(1) of the Act, 21 U.S.C. section 360bbb-3(b)(1), unless the authorization is terminated or revoked sooner.     Influenza A by PCR NEGATIVE NEGATIVE Final   Influenza B by PCR NEGATIVE NEGATIVE Final    Comment: (NOTE) The Xpert Xpress SARS-CoV-2/FLU/RSV assay is intended as an aid in  the diagnosis of influenza from Nasopharyngeal swab specimens and  should not be used as a sole basis for treatment. Nasal washings and  aspirates are unacceptable for Xpert Xpress SARS-CoV-2/FLU/RSV  testing.  Fact Sheet for  Patients: PinkCheek.be  Fact Sheet for Healthcare Providers: GravelBags.it  This test  is not yet approved or cleared by the [place] FDA and  has been authorized for detection and/or diagnosis of SARS-CoV-2 by  FDA under an Emergency Use Authorization (EUA). This EUA will remain  in effect (meaning this test can be used) for the duration of the  Covid-19 declaration under Section 564(b)(1) of the Act, 21  U.S.C. section 360bbb-3(b)(1), unless the authorization is  terminated or revoked. Performed at [facility], [place] [address] [place], [place] [address]          Radiology Studies: EEG  Result Date: 07/30/2020 Lora Havens, MD     07/30/2020  1:32 PM Patient Name: Calvin [name] MRN: [id number] Epilepsy Attending: Lora Havens Referring Physician/Provider: Dr Gean Birchwood Date: 07/30/2020 Duration: 25.55 minutes Patient history: 33-year-old male with altered mental status.  EEG 12 for seizures. Level of alertness: awake/lethargic, asleep AEDs during EEG study: Ativan Technical aspects: This EEG study was done with scalp electrodes positioned according to the 10-20 International system of electrode placement. Electrical activity was acquired at a sampling rate of 500Hz  and reviewed with a high frequency filter of 70Hz  and a low frequency filter of 1Hz . EEG data were recorded continuously and digitally stored. Description: No clear posterior dominant rhythm was seen.  Sleep was characterized by vertex waves, sleep spindles (12 to 14 Hz), maximal frontocentral region.  EEG showed correct generalized 2 to 3 Hz delta slowing admixed with 15 to 18 Hz beta activity distributed symmetrically and diffusely. Hyperventilation and photic stimulation were not performed.   ABNORMALITY -Continuous slow, generalized -Excessive beta, generalized IMPRESSION: This study is suggestive of moderate diffuse encephalopathy, nonspecific etiology. The excessive beta activity seen in the background is most likely due to the effect of benzodiazepine and is a benign EEG pattern. No  seizures or epileptiform discharges were seen throughout the recording. Priyanka Barbra Sarks        Scheduled Meds: . folic acid  1 mg Oral Daily  . heparin  5,000 Units Subcutaneous Q8H  . lactulose  20 g Oral TID  . multivitamin with minerals  1 tablet Oral Daily  . propranolol  10 mg Oral BID   Continuous Infusions: . thiamine injection 500 mg (07/30/20 1421)     LOS: 3 days    Time spent: 35 mins.More than 50% of that time was spent in counseling and/or coordination of care.      Shelly Coss, MD [facility] P11/04/2020, 2:28 PM

## 2020-07-30 NOTE — Progress Notes (Signed)
EEG complete - results pending 

## 2020-07-30 NOTE — Evaluation (Signed)
PT Cancellation Note  Patient Details Name: Calvin [name] MRN: [id number] DOB: [birth year]   Cancelled Treatment:    Reason Eval/Treat Not Completed: Fatigue/lethargy limiting ability to participate. Patient will open eyes briefly to verbal or tactile stimulation, however unable to stay awake/follow direction. Will re-attempt PT eval when patient is more appropriate.     Averey Trompeter 07/30/2020, 3:13 PM

## 2020-07-30 NOTE — Care Management Important Message (Signed)
Important Message  Patient Details  Name: LABARON DIGIROLAMO MRN: [id number] Date of Birth: 1961/01/08   Medicare Important Message Given:  Yes Due to illness patient was   Due to illness patient was not able to sign.  CMA signed on patients behalf and copy was left at the patient bedside.  Zyier Dykema 07/30/2020, 4:01 PM

## 2020-07-30 NOTE — Procedures (Signed)
Patient Name: Calvin [name]  MRN: [id number]  Epilepsy Attending: Lora Havens  Referring Physician/Provider: Dr Gean Birchwood Date: 07/30/2020 Duration: 25.55 minutes  Patient history: 59-year-old male with altered mental status.  EEG 12 for seizures.  Level of alertness: awake/lethargic, asleep  AEDs during EEG study: Ativan  Technical aspects: This EEG study was done with scalp electrodes positioned according to the 10-20 International system of electrode placement. Electrical activity was acquired at a sampling rate of 500Hz  and reviewed with a high frequency filter of 70Hz  and a low frequency filter of 1Hz . EEG data were recorded continuously and digitally stored.   Description: No clear posterior dominant rhythm was seen.  Sleep was characterized by vertex waves, sleep spindles (12 to 14 Hz), maximal frontocentral region.  EEG showed correct generalized 2 to 3 Hz delta slowing admixed with 15 to 18 Hz beta activity distributed symmetrically and diffusely.  Hyperventilation and photic stimulation were not performed.     ABNORMALITY -Continuous slow, generalized -Excessive beta, generalized  IMPRESSION: This study is suggestive of moderate diffuse encephalopathy, nonspecific etiology. The excessive beta activity seen in the background is most likely due to the effect of benzodiazepine and is a benign EEG pattern. No seizures or epileptiform discharges were seen throughout the recording.  Yuepheng Schaller Barbra Sarks

## 2020-07-31 DIAGNOSIS — N179 Acute kidney failure, unspecified: Secondary | ICD-10-CM | POA: Diagnosis not present

## 2020-07-31 LAB — GLUCOSE, CAPILLARY
Glucose-Capillary: 100 mg/dL — ABNORMAL HIGH (ref 70–99)
Glucose-Capillary: 99 mg/dL (ref 70–99)
Glucose-Capillary: 112 mg/dL — ABNORMAL HIGH (ref 70–99)
Glucose-Capillary: 98 mg/dL (ref 70–99)
Glucose-Capillary: 100 mg/dL — ABNORMAL HIGH (ref 70–99)

## 2020-07-31 LAB — BASIC METABOLIC PANEL
Anion gap: 12 (ref 5–15)
BUN: 20 mg/dL (ref 6–20)
CO2: 22 mmol/L (ref 22–32)
Calcium: 10.5 mg/dL — ABNORMAL HIGH (ref 8.9–10.3)
Chloride: 109 mmol/L (ref 98–111)
Creatinine, Ser: 1.47 mg/dL — ABNORMAL HIGH (ref 0.61–1.24)
GFR, Estimated: 54 mL/min — ABNORMAL LOW (ref >60)
Glucose, Bld: 98 mg/dL (ref 70–99)
Potassium: 3.9 mmol/L (ref 3.5–5.1)
Sodium: 143 mmol/L (ref 135–145)

## 2020-07-31 LAB — AMMONIA: Ammonia: 31 umol/L (ref 9–35)

## 2020-07-31 NOTE — Progress Notes (Addendum)
PROGRESS NOTE    Calvin [name]  FAO:[id number] DOB: 26-Mar-1961 DOA: 07/27/2020 PCP: Jenny Reichmann, PA-C   Chief Complain: Confused, wondering  Brief Narrative: Patient is a 59 year old male with history of hepatitis C, chronic alcohol abuse, liver cirrhosis was found to be wandering around the road by his family and was brought to the emergency department.  As per the family, he was found to be confused since last few days.  On presentation, he was not alert or oriented.  CT head, MRI of the brain done in the emergency department denies any acute intracranial abnormalities.  Patient was found to have severely elevated ammonia level, severe AKI with creatinine in the range of 8.  CK level was elevated.  Patient was admitted for the management of hepatic encephalopathy along with severe AKI.  Hospital course remarkable for persistent encephalopathy now slowly improving.  PT/OT recommended skilled nursing facility.   Assessment & Plan:   Principal Problem:   ARF (acute renal failure) (HCC) Active Problems:   Essential hypertension   Decompensated hepatic cirrhosis (HCC)   Chronic hepatitis C without hepatic coma (HCC)   Acute kidney injury on CKD stage II: Baseline creatinine fluctuating between 1-2.8 in the last year..  Presented with creatinine in the range of 8.  This is most likely from acute tubular necrosis from rhabdomyolysis.  Urine sodium was 40.  CT renal study did not show any hydronephrosis or obstructive pathology but showed punctuate nonobstructing stone in the lower right kidney. Patient also had an metabolic acidosis. He was on bicarb drip. Negative screening for Methyl alcohol, ethyleneglycol, acetone, isopropyl alcohol. Continued improvement in the kidney function with IV fluids. Fluids discontinued now.  The kidney function is at baseline now  Acute encephalopathy: Suspected to be from elevated ammonia level, uremia on presentation.  CT head/MRI of the brain did  not show any acute intracranial abnormalities.  No report of seizures.  Mental status has improved and he is currently alert, awake and but oriented to place only.   UDS positive for opiates. Slow improvement in the mental status.As per his wife,  he is a heavy drinker.  Suspicion for  Wernicke's encephalopathy from thiamine deficiency.  Thiamine level pending. Started thiamine at 500 mg IV daily for 3 days.EEG showed moderate diffuse encephalopathy but no seizures or epileptiform discharges.  Mental status slowly improving.  Continue to monitor  History of liver cirrhosis: Secondary to alcohol abuse and hepatitis C.  Mild elevated AST and ALT but this could be from elevated CK.  On Lasix, spironolactone at home which will be continued on discharge.  Also on propanolol at home,most likely for portal hypertension  Normocytic anemia: Continue to monitor CBC.  Currently hemoglobin stable.  Chronic ethanol abuse: Agitated on presentation.   Continue to monitor CIWA  Limited benzos as much as possible. Continue thiamine ,folic acid.  Sinus tachycardia:Most likely secondary to alcohol withdrawal. Much improved now.  Debility/deconditioning: Patient seen by PT/OT and recommended skilled nursing facility on discharge.        DVT prophylaxis:Heparin [place] Code Status: Full Family Communication: Discussed with wife on 07/31/20 on phone  Status is: Inpatient  Remains inpatient appropriate because:Inpatient level of care appropriate due to severity of illness   Dispo: The patient is from: Home              Anticipated d/c is to:SNF              Anticipated d/c date is: 2-3 days  Patient currently is not medically stable to d/c.   Consultants: None  Procedures: EEG  Antimicrobials:  Anti-infectives (From admission, onward)   None      Subjective:  Patient seen and examined at the bedside this morning.  Mental status seems to have improved since yesterday.  He tries to speak  but speech is still slurred, mumbling.  He knew that he is in the hospital.  Much more alert and awake.   Objective: Vitals:   07/31/20 0300 07/31/20 0342 07/31/20 0700 07/31/20 0726  BP: 121/81 122/75 (!) 144/94 130/89  Pulse: 94 98 92 92  Resp:  18  (!) 22  Temp:  97.8 F (36.6 C)  97.9 F (36.6 C)  TempSrc:  Oral  Oral  SpO2:  100%  96%  Weight:      Height:        Intake/Output Summary (Last 24 hours) at 07/31/2020 0844 Last data filed at 07/31/2020 0200 Gross per 24 hour  Intake 278.45 ml  Output 1250 ml  Net -971.55 ml   Filed Weights   07/27/20 2101  Weight: 79.4 kg    Examination:   General exam: Overall comfortable, chronically looking, deconditioned, debilitated Respiratory system: Bilateral equal air entry, normal vesicular breath sounds, no wheezes or crackles  Cardiovascular system: S1 & S2 heard, RRR. No JVD, murmurs, rubs, gallops or clicks. Gastrointestinal system: Abdomen is nondistended, soft and nontender. No organomegaly or masses felt. Normal bowel sounds heard. Central nervous system: Alert and awake but oriented to place only Extremities: No edema, no clubbing ,no cyanosis Skin: No rashes, lesions or ulcers,no icterus ,no pallor   Data Reviewed: I have personally reviewed following labs and imaging studies  CBC: Recent Labs  Lab 07/27/20 1956 07/27/20 1956 07/27/20 2036 07/28/20 0033 07/28/20 0042 07/28/20 0214 07/30/20 0208  WBC 13.9*  --   --  14.6*  --  11.5* 10.8*  NEUTROABS 9.4*  --   --   --   --  7.8* 5.6  HGB 11.4*   < > 12.6* 10.4* 10.9* 10.3* 11.1*  HCT 33.7*   < > 37.0* 30.6* 32.0* 30.5* 32.5*  MCV 94.7  --   --  95.9  --  95.0 94.8  PLT 299  --   --  264  --  248 250   < > = values in this interval not displayed.   Basic Metabolic Panel: Recent Labs  Lab 07/28/20 0214 07/28/20 0731 07/29/20 0931 07/30/20 0208 07/31/20 0451  NA 134* 134* 140 143 143  K 5.0 5.1 4.3 4.0 3.9  CL 104 106 106 106 109  CO2 14* 15* 21*  24 22  GLUCOSE 133* 114* 99 112* 98  BUN 69* 66* 44* 28* 20  CREATININE 6.51* 5.35* 2.03* 1.49* 1.47*  CALCIUM 9.5 9.7 9.7 9.9 10.5*  MG  --   --  2.0  --   --    GFR: Estimated Creatinine Clearance: 54 mL/min (A) (by C-G formula based on SCr of 1.47 mg/dL (H)). Liver Function Tests: Recent Labs  Lab 07/27/20 1956 07/28/20 0214  AST 101* 82*  ALT 49* 41  ALKPHOS 106 87  BILITOT 0.9 0.7  PROT 9.1* 7.6  ALBUMIN 4.0 3.2*   No results for input(s): LIPASE, AMYLASE in the last 168 hours. Recent Labs  Lab 07/27/20 2129 07/28/20 0731 07/29/20 0931 07/30/20 0208 07/31/20 0451  AMMONIA 94* 81* 44* 38* 31   Coagulation Profile: Recent Labs  Lab 07/27/20 1956 07/28/20 0214  INR 1.5* 1.4*   Cardiac Enzymes: Recent Labs  Lab 07/27/20 1956 07/28/20 0731  CKTOTAL 2,722* 2,017*   BNP (last 3 results) No results for input(s): PROBNP in the last 8760 hours. HbA1C: No results for input(s): HGBA1C in the last 72 hours. CBG: Recent Labs  Lab 07/30/20 1153 07/30/20 1550 07/30/20 1918 07/30/20 2333 07/31/20 0727  GLUCAP 108* 101* 99 101* 100*   Lipid Profile: No results for input(s): CHOL, HDL, LDLCALC, TRIG, CHOLHDL, LDLDIRECT in the last 72 hours. Thyroid Function Tests: No results for input(s): TSH, T4TOTAL, FREET4, T3FREE, THYROIDAB in the last 72 hours. Anemia Panel: No results for input(s): VITAMINB12, FOLATE, FERRITIN, TIBC, IRON, RETICCTPCT in the last 72 hours. Sepsis Labs: Recent Labs  Lab 07/28/20 0033 07/28/20 0314  LATICACIDVEN 1.7 1.2    Recent Results (from the past 240 hour(s))  Respiratory Panel by RT PCR (Flu A&B, Covid) - Nasopharyngeal Swab     Status: None   Collection Time: 07/27/20  9:29 PM   Specimen: Nasopharyngeal Swab  Result Value Ref Range Status   SARS Coronavirus 2 by RT PCR NEGATIVE NEGATIVE Final    Comment: (NOTE) SARS-CoV-2 target nucleic acids are NOT DETECTED.  The SARS-CoV-2 RNA is generally detectable in upper  respiratoy specimens during the acute phase of infection. The lowest concentration of SARS-CoV-2 viral copies this assay can detect is 131 copies/mL. A negative result does not preclude SARS-Cov-2 infection and should not be used as the sole basis for treatment or other patient management decisions. A negative result may occur with  improper specimen collection/handling, submission of specimen other than nasopharyngeal swab, presence of viral mutation(s) within the areas targeted by this assay, and inadequate number of viral copies (<131 copies/mL). A negative result must be combined with clinical observations, patient history, and epidemiological information. The expected result is Negative.  Fact Sheet for Patients:  PinkCheek.be  Fact Sheet for Healthcare Providers:  GravelBags.it  This test is no t yet approved or cleared by the [place] FDA and  has been authorized for detection and/or diagnosis of SARS-CoV-2 by FDA under an Emergency Use Authorization (EUA). This EUA will remain  in effect (meaning this test can be used) for the duration of the COVID-19 declaration under Section 564(b)(1) of the Act, 21 U.S.C. section 360bbb-3(b)(1), unless the authorization is terminated or revoked sooner.     Influenza A by PCR NEGATIVE NEGATIVE Final   Influenza B by PCR NEGATIVE NEGATIVE Final    Comment: (NOTE) The Xpert Xpress SARS-CoV-2/FLU/RSV assay is intended as an aid in  the diagnosis of influenza from Nasopharyngeal swab specimens and  should not be used as a sole basis for treatment. Nasal washings and  aspirates are unacceptable for Xpert Xpress SARS-CoV-2/FLU/RSV  testing.  Fact Sheet for Patients: PinkCheek.be  Fact Sheet for Healthcare Providers: GravelBags.it  This test is not yet approved or cleared by the [place] FDA and  has been  authorized for detection and/or diagnosis of SARS-CoV-2 by  FDA under an Emergency Use Authorization (EUA). This EUA will remain  in effect (meaning this test can be used) for the duration of the  Covid-19 declaration under Section 564(b)(1) of the Act, 21  U.S.C. section 360bbb-3(b)(1), unless the authorization is  terminated or revoked. Performed at [facility], [place] [address], [place], [place] [address]          Radiology Studies: EEG  Result Date: 07/30/2020 Lora Havens, MD     07/30/2020  1:32 PM Patient Name: Calvin [name] MRN: [id number] Epilepsy Attending: Lora Havens Referring Physician/Provider: Dr Gean Birchwood Date: 07/30/2020 Duration: 25.55 minutes Patient history: 33-year-old male with altered mental status.  EEG 12 for seizures. Level of alertness: awake/lethargic, asleep AEDs during EEG study: Ativan Technical aspects: This EEG study was done with scalp electrodes positioned according to the 10-20 International system of electrode placement. Electrical activity was acquired at a sampling rate of 500Hz  and reviewed with a high frequency filter of 70Hz  and a low frequency filter of 1Hz . EEG data were recorded continuously and digitally stored. Description: No clear posterior dominant rhythm was seen.  Sleep was characterized by vertex waves, sleep spindles (12 to 14 Hz), maximal frontocentral region.  EEG showed correct generalized 2 to 3 Hz delta slowing admixed with 15 to 18 Hz beta activity distributed symmetrically and diffusely. Hyperventilation and photic stimulation were not performed.   ABNORMALITY -Continuous slow, generalized -Excessive beta, generalized IMPRESSION: This study is suggestive of moderate diffuse encephalopathy, nonspecific etiology. The excessive beta activity seen in the background is most likely due to the effect of benzodiazepine and is a benign EEG pattern. No seizures or epileptiform discharges were seen throughout the recording.  Priyanka Barbra Sarks   CT CERVICAL SPINE WO CONTRAST  Result Date: 07/30/2020 CLINICAL DATA:  Recent fall, neck pain EXAM: CT CERVICAL SPINE WITHOUT CONTRAST TECHNIQUE: Multidetector CT imaging of the cervical spine was performed without intravenous contrast. Multiplanar CT image reconstructions were also generated. COMPARISON:  None. FINDINGS: Alignment: Stabilization collar is absent at the time of examination. There is slight leftward cranial rotation and a left lateral neck flexion. Preservation of normal cervical lordosis. No evidence of traumatic listhesis. No abnormally widened, perched or jumped facets. Normal alignment of the craniocervical and atlantoaxial articulations accounting for positioning. Skull base and vertebrae: No acute skull base fracture. No vertebral body fracture or height loss. Normal bone mineralization. No worrisome osseous lesions. Moderate arthrosis at the atlantodental and basion dens interval with some degenerative spurring as well as calcific pannus formation which can be seen in the setting of CPPD or rheumatoid arthropathy. Soft tissues and spinal canal: No pre or paravertebral fluid or swelling. No visible canal hematoma. Disc levels: Multilevel intervertebral disc height loss with spondylitic endplate changes. Slightly larger disc osteophyte complexes are present C3-4, C4-5 and C5-6 and in combination with some ligamentum flavum infolding do result in at least mild canal narrowing at the C4-5 level. Uncinate spurring and facet hypertrophic changes are present throughout the cervical spine as well resulting in mild-to-moderate multilevel neural foraminal narrowing. Upper chest: Biapical pleuroparenchymal scarring some paraseptal emphysematous changes. Some dependent ground-glass, likely atelectatic. Other: Normal thyroid.  Cervical carotid atherosclerosis. IMPRESSION: 1. No acute fracture or traumatic listhesis of the cervical spine. 2. Slight leftward cranial rotation and a left  lateral neck flexion, positional. 3. Multilevel degenerative disc disease and facet hypertrophic changes resulting in at least mild canal narrowing at C4-5 and mild-to-moderate multilevel neural foraminal narrowing. 4. Moderate arthrosis at the atlantodental and basion dens interval with some degenerative spurring as well as calcific pannus formation which can be seen in the setting of CPPD or rheumatoid arthropathy. 5. Cervical carotid atherosclerosis. 6. Emphysema (ICD10-J43.9). Electronically Signed   By: Lovena Le M.D.   On: 07/30/2020 23:53        Scheduled Meds: . folic acid  1 mg Oral Daily  . heparin  5,000 Units Subcutaneous Q8H  . lactulose  20 g Oral TID  .  multivitamin with minerals  1 tablet Oral Daily  . propranolol  10 mg Oral BID   Continuous Infusions: . thiamine injection Stopped (07/30/20 2042)     LOS: 4 days    Time spent: 35 mins.More than 50% of that time was spent in counseling and/or coordination of care.      Shelly Coss, MD [facility] P11/05/2020, 8:44 AM

## 2020-07-31 NOTE — Evaluation (Signed)
Physical Therapy Evaluation Patient Details Name: Calvin [name] MRN: [id number] DOB: Jun 16, 1961 Today's Date: 07/31/2020   History of Present Illness  59 yo male with onset of acute renal failure and acute metabolic enceph was brought to hosp after being confused and found wandering outside.  Noted elevated ammonia and elevated creatinine.   PMHx:  hep C, EtOH abuse, cirrhosis, hyperammonia, stroke, TIA, HTN, chronic hip pain, spinal surgery, cervical spine surgery, total joint replacement  Clinical Impression  Pt was seen for mobility on side of bed with help to attempt to stand.  Pt is not fully understandable with comments yet, but is nodding yes and no appropriately.  He will be seen for a progression toward gait and standing balance, and to increase his tolerance for all activity.  He is willing to be assisted but cannot initiate any movement yet.  Follow acutely for these needs.    Follow Up Recommendations SNF    Equipment Recommendations  None recommended by PT    Recommendations for Other Services       Precautions / Restrictions Precautions Precautions: Fall Precaution Comments: pt is unable to speak clearly, but nods appropriately Restrictions Weight Bearing Restrictions: No      Mobility  Bed Mobility Overal bed mobility: Needs Assistance Bed Mobility: Supine to Sit;Sit to Supine     Supine to sit: Max assist Sit to supine: Max assist   General bed mobility comments: pt is fairly passive about returning to bed but resistant about moving body to sit up and needs to observe spinal precautions given his history    Transfers Overall transfer level: Needs assistance Equipment used: 1 person hand held assist Transfers: Sit to/from Stand Sit to Stand: Total assist         General transfer comment: Pt can be assisted to partially stand but is not offering much effort on LE's, lethargic effort  Ambulation/Gait             General Gait Details: too  lethargic to try  Stairs            Wheelchair Mobility    Modified Rankin (Stroke Patients Only)       Balance Overall balance assessment: Needs assistance Sitting-balance support: Feet supported Sitting balance-Leahy Scale: Poor     Standing balance support: Bilateral upper extremity supported;During functional activity Standing balance-Leahy Scale: Zero                               Pertinent Vitals/Pain Pain Assessment: Faces Faces Pain Scale: Hurts little more Pain Location: flexion of legs in bed Pain Descriptors / Indicators: Grimacing Pain Intervention(s): Monitored during session;Limited activity within patient's tolerance;Repositioned    Home Living Family/patient expects to be discharged to:: Private residence Living Arrangements: Alone Available Help at Discharge: Family;Personal care attendant;Available PRN/intermittently Type of Home: Apartment Home Access: Stairs to enter Entrance Stairs-Rails: Right Entrance Stairs-Number of Steps: 13 Home Layout: One level Home Equipment: Cane - single point;Grab bars - tub/shower Additional Comments: pt is unable to give hx, was obtained from chart    Prior Function Level of Independence:  (unsure)         Comments: pt cannot report     Hand Dominance   Dominant Hand: Right    Extremity/Trunk Assessment   Upper Extremity Assessment Upper Extremity Assessment: Generalized weakness    Lower Extremity Assessment Lower Extremity Assessment: Generalized weakness    Cervical / Trunk Assessment  Cervical / Trunk Assessment: Other exceptions (history of spinal surgeries)  Communication   Communication: Expressive difficulties  Cognition Arousal/Alertness: Lethargic Behavior During Therapy: Flat affect Overall Cognitive Status: Impaired/Different from baseline Area of Impairment: Problem solving;Awareness;Following commands;Safety/judgement;Memory;Attention;Orientation                  Orientation Level: Place;Time;Situation Current Attention Level: Selective Memory:  (unable to determine) Following Commands: Follows one step commands inconsistently;Follows one step commands with increased time Safety/Judgement: Decreased awareness of safety;Decreased awareness of deficits Awareness: Intellectual Problem Solving: Slow processing;Requires verbal cues;Requires tactile cues General Comments: pt is slow to respond to mobility cues but agreeable to let PT move him      General Comments General comments (skin integrity, edema, etc.): Pt was up to side of bed with some moments of fair sit balance, but then became distracted and lost his concentration.      Exercises     Assessment/Plan    PT Assessment Patient needs continued PT services  PT Problem List Decreased strength;Decreased range of motion;Decreased activity tolerance;Decreased balance;Decreased mobility;Decreased coordination;Decreased cognition;Decreased safety awareness       PT Treatment Interventions DME instruction;Gait training;Functional mobility training;Therapeutic activities;Therapeutic exercise;Balance training;Neuromuscular re-education;Patient/family education    PT Goals (Current goals can be found in the Care Plan section)  Acute Rehab PT Goals Patient Stated Goal: none verbalized that PT could understand PT Goal Formulation: Patient unable to participate in goal setting Time For Goal Achievement: 08/14/20 Potential to Achieve Goals: Fair    Frequency Min 3X/week   Barriers to discharge   unsure of support at dc    Co-evaluation               AM-PAC PT "6 Clicks" Mobility  Outcome Measure Help needed turning from your back to your side while in a flat bed without using bedrails?: A Lot Help needed moving from lying on your back to sitting on the side of a flat bed without using bedrails?: A Lot Help needed moving to and from a bed to a chair (including a wheelchair)?:  Total Help needed standing up from a chair using your arms (e.g., wheelchair or bedside chair)?: Total Help needed to walk in hospital room?: Total Help needed climbing 3-5 steps with a railing? : Total 6 Click Score: 8    End of Session Equipment Utilized During Treatment: Gait belt Activity Tolerance: Patient limited by fatigue;Treatment limited secondary to medical complications (Comment) Patient left: in bed;with call bell/phone within reach;with bed alarm set;Other (comment) (asleep quickly after exerting to try to stand) Nurse Communication: Mobility status PT Visit Diagnosis: Unsteadiness on feet (R26.81);Muscle weakness (generalized) (M62.81);Difficulty in walking, not elsewhere classified (R26.2)    Time: 4562-5638 PT Time Calculation (min) (ACUTE ONLY): 17 min   Charges:   PT Evaluation $PT Eval Moderate Complexity: 1 Mod         Ramond Dial 07/31/2020, 1:03 PM  Mee Hives, PT MS Acute Rehab Dept. Number: [place]

## 2020-07-31 NOTE — Plan of Care (Signed)
  Problem: Education: Goal: Knowledge of General Education information will improve Description: Including pain rating scale, medication(s)/side effects and non-pharmacologic comfort measures Outcome: Progressing   Problem: Safety: Goal: Ability to remain free from injury will improve Outcome: Progressing   Problem: Pain Managment: Goal: General experience of comfort will improve Outcome: Progressing   Problem: Skin Integrity: Goal: Risk for impaired skin integrity will decrease Outcome: Progressing   

## 2020-07-31 NOTE — Plan of Care (Signed)

## 2020-07-31 NOTE — Progress Notes (Signed)
   07/30/20 2100  Provider Notification  Provider Name/Title DR Cyd Silence  Date Provider Notified 07/30/20  Time Provider Notified 2100  Notification Type Page  Notification Reason Other (Comment) (pt.c/o neck pain)  Response See new orders (oxy 5 mg po x1, CT spine ordered)  Date of Provider Response 07/30/20  Time of Provider Response 2120

## 2020-08-01 DIAGNOSIS — N179 Acute kidney failure, unspecified: Secondary | ICD-10-CM | POA: Diagnosis not present

## 2020-08-01 DIAGNOSIS — R5381 Other malaise: Secondary | ICD-10-CM | POA: Diagnosis not present

## 2020-08-01 DIAGNOSIS — G9340 Encephalopathy, unspecified: Secondary | ICD-10-CM

## 2020-08-01 DIAGNOSIS — F1010 Alcohol abuse, uncomplicated: Secondary | ICD-10-CM

## 2020-08-01 DIAGNOSIS — N182 Chronic kidney disease, stage 2 (mild): Secondary | ICD-10-CM | POA: Diagnosis not present

## 2020-08-01 LAB — GLUCOSE, CAPILLARY
Glucose-Capillary: 102 mg/dL — ABNORMAL HIGH (ref 70–99)
Glucose-Capillary: 93 mg/dL (ref 70–99)
Glucose-Capillary: 114 mg/dL — ABNORMAL HIGH (ref 70–99)
Glucose-Capillary: 98 mg/dL (ref 70–99)
Glucose-Capillary: 111 mg/dL — ABNORMAL HIGH (ref 70–99)
Glucose-Capillary: 94 mg/dL (ref 70–99)

## 2020-08-01 MED ORDER — LORAZEPAM 1 MG PO TABS
1.0000 mg | ORAL_TABLET | ORAL | Status: DC | PRN
  Administered 2020-08-01: 1 mg via ORAL
  Administered 2020-08-01: 2 mg via ORAL
  Filled 2020-08-01 (×2): qty 2

## 2020-08-01 MED ORDER — HALOPERIDOL LACTATE 5 MG/ML IJ SOLN
2.0000 mg | Freq: Four times a day (QID) | INTRAMUSCULAR | Status: DC | PRN
  Administered 2020-08-01 – 2020-08-08 (×4): 2 mg via INTRAVENOUS
  Filled 2020-08-01 (×4): qty 1

## 2020-08-01 MED ORDER — ORAL CARE MOUTH RINSE
15.0000 mL | Freq: Two times a day (BID) | OROMUCOSAL | Status: DC
  Administered 2020-08-01 – 2020-08-09 (×16): 15 mL via OROMUCOSAL

## 2020-08-01 MED ORDER — LORAZEPAM 2 MG/ML IJ SOLN
1.0000 mg | INTRAMUSCULAR | Status: DC | PRN
  Administered 2020-08-01 (×2): 2 mg via INTRAVENOUS
  Filled 2020-08-01 (×2): qty 1

## 2020-08-01 NOTE — Assessment & Plan Note (Signed)
-   not a good transplant or treatment candidate until abstinent from alchol

## 2020-08-01 NOTE — Assessment & Plan Note (Addendum)
-  Evaluated by PT, with recommendations for SNF - mentation now stable; this may very well be new baseline. He will benefit from at least rehab which will prevent further etoh use for now; hopeful that thiamine replacement will continue to help some as well - when bed/facility found for placement, patient is stable for discharge at this time

## 2020-08-01 NOTE — Progress Notes (Signed)
Pt noted with increased agitation, hallucinations and visible sweats. Reached to on call MD for further interventions. New orders placed for ativan PRN. See MAR.

## 2020-08-01 NOTE — Assessment & Plan Note (Signed)
-   Baseline creatinine fluctuating between 1-2.8 in the last year..  Presented with creatinine in the range of 8.  This is most likely from acute tubular necrosis from rhabdomyolysis.  Urine sodium was 40.   - CT renal study did not show any hydronephrosis or obstructive pathology but showed punctuate nonobstructing stone in the lower right kidney. Patient also had a metabolic acidosis. He was on bicarb drip. Negative screening for Methyl alcohol, ethyleneglycol, acetone, isopropyl alcohol. - Continued improvement in the kidney function with IV fluids. Fluids discontinued now.  The kidney function is at baseline now

## 2020-08-01 NOTE — Progress Notes (Signed)
PROGRESS NOTE    Calvin [name]   FUX:[id number]  DOB: May 09, 1961  DOA: 07/27/2020     5  PCP: Jenny Reichmann, PA-C  CC: Altered mental status  Hospital Course: [name] is a 59 year old male with history of hepatitis C, chronic alcohol abuse, liver cirrhosis who was found to be wandering around the road by his family and was brought to the emergency department.  As per the family, he was found to be confused for a few days.  On presentation, he was not alert or oriented.  CT head, MRI of the brain done in the emergency department did not any acute intracranial abnormalities.  Patient was found to have severely elevated ammonia level, severe AKI with creatinine in the range of 8.  CK level was elevated.    Patient was admitted for the management of hepatic encephalopathy along with severe AKI.  Hospital course remarkable for persistent encephalopathy. PT/OT recommended skilled nursing facility.   Interval History:  Overnight patient required multiple doses of Ativan due to confusion and agitation.  He has hand mitts in place this morning and has been trying to get out of bed.  He was assisted back to the bed with help of nursing this morning.  His speech is severely slurred but able to understand him for the most part and he is making sense when talking and asking questions although does appear slightly sarcastic at times and frustrated. He was however cooperative and behavior was appropriate.  Old records reviewed in assessment of this patient  ROS: Constitutional: negative for chills and fevers, Respiratory: negative for cough, Cardiovascular: negative for chest pain and Gastrointestinal: negative for abdominal pain  Assessment & Plan: * Acute renal failure superimposed on stage 2 chronic kidney disease (HCC) - Baseline creatinine fluctuating between 1-2.8 in the last year..  Presented with creatinine in the range of 8.  This is most likely from acute tubular necrosis from  rhabdomyolysis.  Urine sodium was 40.   - CT renal study did not show any hydronephrosis or obstructive pathology but showed punctuate nonobstructing stone in the lower right kidney. Patient also had a metabolic acidosis. He was on bicarb drip. Negative screening for Methyl alcohol, ethyleneglycol, acetone, isopropyl alcohol. - Continued improvement in the kidney function with IV fluids. Fluids discontinued now.  The kidney function is at baseline now  Acute encephalopathy -Multifactorial due to metabolic encephalopathy, hepatic encephalopathy, uremic encephalopathy -CT head unremarkable.  No report of seizures -mentation has been mostly improving however on 07/31/2020 he became more confused and agitated and was started on CIWA protocol -he was also started on high-dose thiamine and B1 level is pending  Physical deconditioning -Evaluated by PT, with recommendations for SNF -currently not medically stable for discharge  Chronic hepatitis C without hepatic coma ([place]) - not a good transplant or treatment candidate until abstinent from alchol  Decompensated hepatic cirrhosis ([place]) - Secondary to alcohol abuse and hepatitis C.  Mild elevated AST and ALT but this could be from elevated CK.   - On Lasix, spironolactone at home which will be continued on discharge.  Also on propanolol at home,most likely for portal hypertension  Alcohol abuse -Continue on CIWA protocol -continue thiamine, folic acid, multivitamin  Essential hypertension -Controlled for now, continue propranolol    Antimicrobials: None  DVT prophylaxis: HSQ Code Status: Full Family Communication: None present Disposition Plan: Status is: Inpatient  Remains inpatient appropriate because:Altered mental status, Unsafe d/c plan, IV treatments appropriate due to intensity  of illness or inability to take PO and Inpatient level of care appropriate due to severity of illness   Dispo: The patient is from: Home               Anticipated d/c is to: SNF              Anticipated d/c date is: 3 days              Patient currently is not medically stable to d/c.       Objective: Blood pressure 133/88, pulse 89, temperature 98.3 F (36.8 C), temperature source Oral, resp. rate 20, height 5\' 7"  (1.702 m), weight 68.1 kg, SpO2 95 %.  Examination: General appearance: thin adult man with severe slurring but understandable speech; slightly agitated Head: Normocephalic, without obvious abnormality, atraumatic Eyes: EOMI Lungs: clear to auscultation bilaterally Heart: regular rate and rhythm and S1, S2 normal Abdomen: thin, soft, NT, ND, BS present Extremities: no edema Skin: mobility and turgor normal Neurologic: no asterixis; mentation slowed but thought process seems intact/normal. Follows commands and moves all 4 extremities  Consultants:   none  Procedures:   none  Data Reviewed: I have personally reviewed following labs and imaging studies Results for orders placed or performed during the hospital encounter of 07/27/20 (from the past 24 hour(s))  Glucose, capillary     Status: Abnormal   Collection Time: 07/31/20  3:49 PM  Result Value Ref Range   Glucose-Capillary 112 (H) 70 - 99 mg/dL  Glucose, capillary     Status: None   Collection Time: 07/31/20  8:02 PM  Result Value Ref Range   Glucose-Capillary 98 70 - 99 mg/dL  Glucose, capillary     Status: Abnormal   Collection Time: 07/31/20 11:10 PM  Result Value Ref Range   Glucose-Capillary 100 (H) 70 - 99 mg/dL  Glucose, capillary     Status: Abnormal   Collection Time: 08/01/20  4:19 AM  Result Value Ref Range   Glucose-Capillary 102 (H) 70 - 99 mg/dL  Glucose, capillary     Status: None   Collection Time: 08/01/20  9:01 AM  Result Value Ref Range   Glucose-Capillary 93 70 - 99 mg/dL  Glucose, capillary     Status: Abnormal   Collection Time: 08/01/20 12:07 PM  Result Value Ref Range   Glucose-Capillary 114 (H) 70 - 99 mg/dL    Recent  Results (from the past 240 hour(s))  Respiratory Panel by RT PCR (Flu A&B, Covid) - Nasopharyngeal Swab     Status: None   Collection Time: 07/27/20  9:29 PM   Specimen: Nasopharyngeal Swab  Result Value Ref Range Status   SARS Coronavirus 2 by RT PCR NEGATIVE NEGATIVE Final    Comment: (NOTE) SARS-CoV-2 target nucleic acids are NOT DETECTED.  The SARS-CoV-2 RNA is generally detectable in upper respiratoy specimens during the acute phase of infection. The lowest concentration of SARS-CoV-2 viral copies this assay can detect is 131 copies/mL. A negative result does not preclude SARS-Cov-2 infection and should not be used as the sole basis for treatment or other patient management decisions. A negative result may occur with  improper specimen collection/handling, submission of specimen other than nasopharyngeal swab, presence of viral mutation(s) within the areas targeted by this assay, and inadequate number of viral copies (<131 copies/mL). A negative result must be combined with clinical observations, patient history, and epidemiological information. The expected result is Negative.  Fact Sheet for Patients:  PinkCheek.be  Fact Sheet  for Healthcare Providers:  GravelBags.it  This test is no t yet approved or cleared by the [place] and  has been authorized for detection and/or diagnosis of SARS-CoV-2 by FDA under an Emergency Use Authorization (EUA). This EUA will remain  in effect (meaning this test can be used) for the duration of the COVID-19 declaration under Section 564(b)(1) of the Act, 21 U.S.C. section 360bbb-3(b)(1), unless the authorization is terminated or revoked sooner.     Influenza A by PCR NEGATIVE NEGATIVE Final   Influenza B by PCR NEGATIVE NEGATIVE Final    Comment: (NOTE) The Xpert Xpress SARS-CoV-2/FLU/RSV assay is intended as an aid in  the diagnosis of influenza from Nasopharyngeal swab  specimens and  should not be used as a sole basis for treatment. Nasal washings and  aspirates are unacceptable for Xpert Xpress SARS-CoV-2/FLU/RSV  testing.  Fact Sheet for Patients: PinkCheek.be  Fact Sheet for Healthcare Providers: GravelBags.it  This test is not yet approved or cleared by the [place] FDA and  has been authorized for detection and/or diagnosis of SARS-CoV-2 by  FDA under an Emergency Use Authorization (EUA). This EUA will remain  in effect (meaning this test can be used) for the duration of the  Covid-19 declaration under Section 564(b)(1) of the Act, 21  U.S.C. section 360bbb-3(b)(1), unless the authorization is  terminated or revoked. Performed at [facility], [place] [address] [place], [place] [address]      Radiology Studies: CT CERVICAL SPINE WO CONTRAST  Result Date: 07/30/2020 CLINICAL DATA:  Recent fall, neck pain EXAM: CT CERVICAL SPINE WITHOUT CONTRAST TECHNIQUE: Multidetector CT imaging of the cervical spine was performed without intravenous contrast. Multiplanar CT image reconstructions were also generated. COMPARISON:  None. FINDINGS: Alignment: Stabilization collar is absent at the time of examination. There is slight leftward cranial rotation and a left lateral neck flexion. Preservation of normal cervical lordosis. No evidence of traumatic listhesis. No abnormally widened, perched or jumped facets. Normal alignment of the craniocervical and atlantoaxial articulations accounting for positioning. Skull base and vertebrae: No acute skull base fracture. No vertebral body fracture or height loss. Normal bone mineralization. No worrisome osseous lesions. Moderate arthrosis at the atlantodental and basion dens interval with some degenerative spurring as well as calcific pannus formation which can be seen in the setting of CPPD or rheumatoid arthropathy. Soft tissues and spinal canal: No pre or  paravertebral fluid or swelling. No visible canal hematoma. Disc levels: Multilevel intervertebral disc height loss with spondylitic endplate changes. Slightly larger disc osteophyte complexes are present C3-4, C4-5 and C5-6 and in combination with some ligamentum flavum infolding do result in at least mild canal narrowing at the C4-5 level. Uncinate spurring and facet hypertrophic changes are present throughout the cervical spine as well resulting in mild-to-moderate multilevel neural foraminal narrowing. Upper chest: Biapical pleuroparenchymal scarring some paraseptal emphysematous changes. Some dependent ground-glass, likely atelectatic. Other: Normal thyroid.  Cervical carotid atherosclerosis. IMPRESSION: 1. No acute fracture or traumatic listhesis of the cervical spine. 2. Slight leftward cranial rotation and a left lateral neck flexion, positional. 3. Multilevel degenerative disc disease and facet hypertrophic changes resulting in at least mild canal narrowing at C4-5 and mild-to-moderate multilevel neural foraminal narrowing. 4. Moderate arthrosis at the atlantodental and basion dens interval with some degenerative spurring as well as calcific pannus formation which can be seen in the setting of CPPD or rheumatoid arthropathy. 5. Cervical carotid atherosclerosis. 6. Emphysema (ICD10-J43.9). Electronically Signed   By: March Rummage  [facility] M.D.   On: 07/30/2020 23:53   CT CERVICAL SPINE WO CONTRAST  Final Result    MR BRAIN WO CONTRAST  Final Result    CT RENAL STONE STUDY  Final Result    DG Chest Portable 1 View  Final Result    CT HEAD WO CONTRAST  Final Result      Scheduled Meds: . folic acid  1 mg Oral Daily  . heparin  5,000 Units Subcutaneous Q8H  . lactulose  20 g Oral TID  . mouth rinse  15 mL Mouth Rinse BID  . multivitamin with minerals  1 tablet Oral Daily  . propranolol  10 mg Oral BID   PRN Meds: influenza vac split quadrivalent PF, LORazepam **OR** LORazepam, pneumococcal 23  valent vaccine Continuous Infusions: . thiamine injection 500 mg (07/31/20 1007)     LOS: 5 days  Time spent: Greater than 50% of the 35 minute visit was spent in counseling/coordination of care for the patient as laid out in the A&P.   Dwyane Dee, MD [facility] 08/01/2020, 1:49 PM

## 2020-08-01 NOTE — Assessment & Plan Note (Signed)
-   Secondary to alcohol abuse and hepatitis C.  Mild elevated AST and ALT but this could be from elevated CK.   - On Lasix, spironolactone at home which will be continued on discharge.  Also on propanolol at home,most likely for portal hypertension

## 2020-08-01 NOTE — Hospital Course (Addendum)
Mr. [name] is a 59 year old male with history of hepatitis C, chronic alcohol abuse, liver cirrhosis who was found to be wandering around the road by his family and was brought to the emergency department.  As per the family, he was found to be confused for a few days.  On presentation, he was not alert or oriented.  CT head, MRI of the brain done in the emergency department did not any acute intracranial abnormalities.  Patient was found to have severely elevated ammonia level, severe AKI with creatinine in the range of 8.  CK level was elevated.    Patient was admitted for the management of hepatic encephalopathy along with severe AKI.  Hospital course remarkable for persistent encephalopathy. PT/OT recommended skilled nursing facility. He was also started on high dose thiamine in case of possible B1 deficiency; lab was ordered but never resulted (probable lost sample).   His mentation did slowly improve throughout remainder of hospitalization.  Disposition was the biggest obstacle as initial facility offer was declined by family.

## 2020-08-01 NOTE — NC FL2 (Deleted)
[place] MEDICAID FL2 LEVEL OF CARE SCREENING TOOL     IDENTIFICATION  Patient Name: Calvin [name] Birthdate: 12/01/1960 Sex: male Admission Date (Current Location): 07/27/2020  [facility] and [place] Number:  Herbalist and Address:  [facility], [place] [address] [place], [place] [address]      Provider Number: [id number]  Attending Physician Name and Address:  Dwyane Dee, MD  Relative Name and Phone Number:   Harveer Sadler  [phone]    Current Level of Care: Hospital Recommended Level of Care: [place] Prior Approval Number:    Date Approved/Denied:   PASRR Number:   [id number]  Discharge Plan: SNF    Current Diagnoses: Patient Active Problem List   Diagnosis Date Noted  . Acute encephalopathy 08/01/2020  . Physical deconditioning 08/01/2020  . Metabolic encephalopathy   . ARF (acute renal failure) ([place]) 07/27/2020  . Acute renal failure superimposed on stage 2 chronic kidney disease ([place]) 04/02/2020  . Hyperkalemia 04/02/2020  . Hallucinations 04/02/2020  . Anemia of chronic disease 04/02/2020  . Chronic hepatitis C without hepatic coma ([place]) 08/09/2019  . Ascites due to alcoholic cirrhosis ([place])   . SOB (shortness of breath)   . Decompensated hepatic cirrhosis ([place]) 07/15/2019  . Essential hypertension 06/23/2016  . Alcohol abuse 06/23/2016  . Chronic left hip pain 06/23/2016  . Hyponatremia 06/23/2016  . Hyperlipidemia   . Blind right eye     Orientation RESPIRATION BLADDER Height & Weight     Self  Normal Incontinent, External catheter Weight: 68.1 kg Height:  5\' 7"  (170.2 cm)  BEHAVIORAL SYMPTOMS/MOOD NEUROLOGICAL BOWEL NUTRITION STATUS  Other (Comment) (confusion/ agitation)  (n/a) Incontinent Diet (2 gram sodium diet)  AMBULATORY STATUS COMMUNICATION OF NEEDS Skin   Extensive Assist Non-Verbally (unable to speak clearly but nods appropiately) Normal                        Personal Care Assistance Level of Assistance  Bathing, Feeding, Dressing, Total care Bathing Assistance: Maximum assistance Feeding assistance: Maximum assistance Dressing Assistance: Maximum assistance Total Care Assistance: Maximum assistance   Functional Limitations Info  Sight, Hearing, Speech Sight Info: Adequate Hearing Info: Adequate Speech Info: Impaired    SPECIAL CARE FACTORS FREQUENCY  PT (By licensed PT), OT (By licensed OT)     PT Frequency: 5X OT Frequency: 5X            Contractures Contractures Info: Not present    Additional Factors Info  Code Status, Allergies, Psychotropic, Insulin Sliding Scale, Isolation Precautions, Suctioning Needs Code Status Info: Full Allergies Info: No know drug allergies Psychotropic Info: Ativan as needed for agitation Insulin Sliding Scale Info: see d/c summary for sliding scale info Isolation Precautions Info: n/a Suctioning Needs: n/a   Current Medications (08/01/2020):  This is the current hospital active medication list Current Facility-Administered Medications  Medication Dose Route Frequency Provider Last Rate Last Admin  . folic acid (FOLVITE) tablet 1 mg  1 mg Oral Daily Adhikari, Amrit, MD   1 mg at 08/01/20 1000  . heparin injection 5,000 Units  5,000 Units Subcutaneous Q8H Rise Patience, MD   5,000 Units at 08/01/20 1404  . influenza vac split quadrivalent PF (FLUARIX) injection 0.5 mL  0.5 mL Intramuscular Prior to discharge Adhikari, Amrit, MD      . lactulose (CHRONULAC) 10 GM/15ML solution 20 g  20 g Oral TID Shelly Coss, MD   20 g  at 08/01/20 1059  . LORazepam (ATIVAN) tablet 1-4 mg  1-4 mg Oral Q1H PRN Vernelle Emerald, MD   1 mg at 08/01/20 1000   Or  . LORazepam (ATIVAN) injection 1-4 mg  1-4 mg Intravenous Q1H PRN Vernelle Emerald, MD   2 mg at 08/01/20 0506  . MEDLINE mouth rinse  15 mL Mouth Rinse BID Shelly Coss, MD   15 mL at 08/01/20 1000  . multivitamin with minerals tablet 1  tablet  1 tablet Oral Daily Shelly Coss, MD   1 tablet at 08/01/20 1000  . pneumococcal 23 valent vaccine (PNEUMOVAX-23) injection 0.5 mL  0.5 mL Intramuscular Prior to discharge Shelly Coss, MD      . propranolol (INDERAL) tablet 10 mg  10 mg Oral BID Shelly Coss, MD   10 mg at 08/01/20 1000     Discharge Medications: Please see discharge summary for a list of discharge medications.  Relevant Imaging Results:  Relevant Lab Results:   Additional Information ss# [id number]  Angelita Ingles, RN

## 2020-08-01 NOTE — Assessment & Plan Note (Addendum)
-  Multifactorial due to metabolic encephalopathy, hepatic encephalopathy, uremic encephalopathy -CT head unremarkable.  No report of seizures -mentation has been mostly improving however on 07/31/2020 he became more confused and agitated and was started on CIWA protocol - he is rather impulsive and remains encephalopathic despite correcting electrolytes, ammonia and other derangements; acting almost as Warnicke's; he has been started on high-dose thiamine and B1 level is pending; turn around too long, sample may be lost -s/p high dose thiamine course - continue on thiamine

## 2020-08-01 NOTE — Assessment & Plan Note (Addendum)
-  continue thiamine, folic acid, multivitamin

## 2020-08-01 NOTE — Progress Notes (Signed)
HOSPITAL MEDICINE OVERNIGHT EVENT NOTE    Nursing notes that patient is exhibiting worsening agitation, diaphoresis, tremulousness and hallucinations.   Patient is preventing nurses from caring for him and is trying to get out of bed.  Chart reviewed.  Patient is suffering from [facility] cirrhosis and has known Hx of alcohol abuse.  Concern for developing alcohol withdrawal.  Will initiate CIWA protocol, provide PRN Ativan.   Ativan should be safe in patients with mild/moderate severity cirrhosis.  We will reduce the dose if it induces too much sedation.  Will monitor closely for improvement.  If patient worsens or becomes combatitive and tries to elope restraints may be needed.  Vernelle Emerald  MD [facility]

## 2020-08-01 NOTE — Plan of Care (Signed)

## 2020-08-01 NOTE — Assessment & Plan Note (Signed)
-  Controlled for now, continue propranolol
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
## 2020-08-02 DIAGNOSIS — N182 Chronic kidney disease, stage 2 (mild): Secondary | ICD-10-CM | POA: Diagnosis not present

## 2020-08-02 DIAGNOSIS — N179 Acute kidney failure, unspecified: Secondary | ICD-10-CM | POA: Diagnosis not present

## 2020-08-02 LAB — CBC WITH DIFFERENTIAL/PLATELET
Abs Immature Granulocytes: 0 10*3/uL (ref 0.00–0.07)
Basophils Absolute: 0.1 10*3/uL (ref 0.0–0.1)
Basophils Relative: 1 %
Eosinophils Absolute: 0 10*3/uL (ref 0.0–0.5)
Eosinophils Relative: 0 %
HCT: 37.4 % — ABNORMAL LOW (ref 39.0–52.0)
Hemoglobin: 12.2 g/dL — ABNORMAL LOW (ref 13.0–17.0)
Lymphocytes Relative: 37 %
Lymphs Abs: 5.1 10*3/uL — ABNORMAL HIGH (ref 0.7–4.0)
MCH: 31.9 pg (ref 26.0–34.0)
MCHC: 32.6 g/dL (ref 30.0–36.0)
MCV: 97.7 fL (ref 80.0–100.0)
Monocytes Absolute: 0.7 10*3/uL (ref 0.1–1.0)
Monocytes Relative: 5 %
Neutro Abs: 7.9 10*3/uL — ABNORMAL HIGH (ref 1.7–7.7)
Neutrophils Relative %: 57 %
Platelets: 216 10*3/uL (ref 150–400)
RBC: 3.83 MIL/uL — ABNORMAL LOW (ref 4.22–5.81)
RDW: 15.3 % (ref 11.5–15.5)
WBC: 13.9 10*3/uL — ABNORMAL HIGH (ref 4.0–10.5)
nRBC: 0.3 % — ABNORMAL HIGH (ref 0.0–0.2)
nRBC: 1 /100 WBC — ABNORMAL HIGH

## 2020-08-02 LAB — COMPREHENSIVE METABOLIC PANEL
ALT: 26 U/L (ref 0–44)
AST: 35 U/L (ref 15–41)
Albumin: 3 g/dL — ABNORMAL LOW (ref 3.5–5.0)
Alkaline Phosphatase: 83 U/L (ref 38–126)
Anion gap: 11 (ref 5–15)
BUN: 26 mg/dL — ABNORMAL HIGH (ref 6–20)
CO2: 18 mmol/L — ABNORMAL LOW (ref 22–32)
Calcium: 10.6 mg/dL — ABNORMAL HIGH (ref 8.9–10.3)
Chloride: 116 mmol/L — ABNORMAL HIGH (ref 98–111)
Creatinine, Ser: 1.59 mg/dL — ABNORMAL HIGH (ref 0.61–1.24)
GFR, Estimated: 49 mL/min — ABNORMAL LOW (ref >60)
Glucose, Bld: 95 mg/dL (ref 70–99)
Potassium: 3.7 mmol/L (ref 3.5–5.1)
Sodium: 145 mmol/L (ref 135–145)
Total Bilirubin: 1.3 mg/dL — ABNORMAL HIGH (ref 0.3–1.2)
Total Protein: 8.2 g/dL — ABNORMAL HIGH (ref 6.5–8.1)

## 2020-08-02 LAB — GLUCOSE, CAPILLARY
Glucose-Capillary: 76 mg/dL (ref 70–99)
Glucose-Capillary: 100 mg/dL — ABNORMAL HIGH (ref 70–99)
Glucose-Capillary: 120 mg/dL — ABNORMAL HIGH (ref 70–99)
Glucose-Capillary: 98 mg/dL (ref 70–99)
Glucose-Capillary: 101 mg/dL — ABNORMAL HIGH (ref 70–99)

## 2020-08-02 LAB — MAGNESIUM: Magnesium: 1.9 mg/dL (ref 1.7–2.4)

## 2020-08-02 MED ORDER — THIAMINE HCL 100 MG/ML IJ SOLN
250.0000 mg | Freq: Every day | INTRAVENOUS | Status: AC
  Administered 2020-08-02 – 2020-08-06 (×5): 250 mg via INTRAVENOUS
  Filled 2020-08-02 (×5): qty 2.5

## 2020-08-02 MED ORDER — VITAMIN B-12 1000 MCG PO TABS
1000.0000 ug | ORAL_TABLET | Freq: Every day | ORAL | Status: DC
  Administered 2020-08-02 – 2020-08-09 (×7): 1000 ug via ORAL
  Filled 2020-08-02 (×7): qty 1

## 2020-08-02 NOTE — Plan of Care (Signed)

## 2020-08-02 NOTE — Progress Notes (Signed)
PROGRESS NOTE    Calvin [name]   IRJ:[id number]  DOB: 02/20/1961  DOA: 07/27/2020     6  PCP: Jenny Reichmann, PA-C  CC: Altered mental status  Hospital Course: [name] is a 59 year old male with history of hepatitis C, chronic alcohol abuse, liver cirrhosis who was found to be wandering around the road by his family and was brought to the emergency department.  As per the family, he was found to be confused for a few days.  On presentation, he was not alert or oriented.  CT head, MRI of the brain done in the emergency department did not any acute intracranial abnormalities.  Patient was found to have severely elevated ammonia level, severe AKI with creatinine in the range of 8.  CK level was elevated.    Patient was admitted for the management of hepatic encephalopathy along with severe AKI.  Hospital course remarkable for persistent encephalopathy. PT/OT recommended skilled nursing facility.   Interval History:  Patient slept well overnight after dose of Haldol.  That seems to work better than Ativan for him.  This morning he remains impulsive trying to get up out of bed and remains unsteady on his feet.  Multiple attempts to place patient back in bed were made with staff.  If able, will order safety sitter for today and continue watching in the hospital 1 more day.  Old records reviewed in assessment of this patient  ROS: Constitutional: negative for chills and fevers, Respiratory: negative for cough, Cardiovascular: negative for chest pain and Gastrointestinal: negative for abdominal pain  Assessment & Plan: * Acute renal failure superimposed on stage 2 chronic kidney disease (HCC) - Baseline creatinine fluctuating between 1-2.8 in the last year..  Presented with creatinine in the range of 8.  This is most likely from acute tubular necrosis from rhabdomyolysis.  Urine sodium was 40.   - CT renal study did not show any hydronephrosis or obstructive pathology but showed  punctuate nonobstructing stone in the lower right kidney. Patient also had a metabolic acidosis. He was on bicarb drip. Negative screening for Methyl alcohol, ethyleneglycol, acetone, isopropyl alcohol. - Continued improvement in the kidney function with IV fluids. Fluids discontinued now.  The kidney function is at baseline now  Acute encephalopathy -Multifactorial due to metabolic encephalopathy, hepatic encephalopathy, uremic encephalopathy -CT head unremarkable.  No report of seizures -mentation has been mostly improving however on 07/31/2020 he became more confused and agitated and was started on CIWA protocol - he is rather impulsive and remains encephalopathic despite correcting electrolytes, ammonia and other derangements; acting almost as Warnicke's; he has been started on high-dose thiamine and B1 level is pending  Physical deconditioning -Evaluated by PT, with recommendations for SNF -currently not medically stable for discharge  Chronic hepatitis C without hepatic coma (Calvin) - not a good transplant or treatment candidate until abstinent from alchol  Decompensated hepatic cirrhosis ([place]) - Secondary to alcohol abuse and hepatitis C.  Mild elevated AST and ALT but this could be from elevated CK.   - On Lasix, spironolactone at home which will be continued on discharge.  Also on propanolol at home,most likely for portal hypertension  Alcohol abuse -Continue on CIWA protocol -continue thiamine, folic acid, multivitamin  Essential hypertension -Controlled for now, continue propranolol   Antimicrobials: None  DVT prophylaxis: HSQ Code Status: Full Family Communication: None present Disposition Plan: Status is: Inpatient  Remains inpatient appropriate because:Altered mental status, Unsafe d/c plan, IV treatments appropriate due to intensity  of illness or inability to take PO and Inpatient level of care appropriate due to severity of illness   Dispo: The patient is from:  Home              Anticipated d/c is to: SNF              Anticipated d/c date is: 2 days              Patient currently is not medically stable to d/c.  Objective: Blood pressure (!) 141/95, pulse 87, temperature 98.4 F (36.9 C), resp. rate 15, height 5\' 7"  (1.702 m), weight 67.6 kg, SpO2 100 %.  Examination: General appearance: Speech has slightly improved this morning, still slurring some.  When seen standing next to bed his gait was unsteady.  Remains impulsive but able to redirect easily Head: Normocephalic, without obvious abnormality, atraumatic Eyes: EOMI Lungs: clear to auscultation bilaterally Heart: regular rate and rhythm and S1, S2 normal Abdomen: thin, soft, NT, ND, BS present Extremities: no edema Skin: mobility and turgor normal Neurologic: no asterixis; unstable gait; redirects easily.  No focal deficits appreciated  Consultants:   none  Procedures:   none  Data Reviewed: I have personally reviewed following labs and imaging studies Results for orders placed or performed during the hospital encounter of 07/27/20 (from the past 24 hour(s))  Glucose, capillary     Status: None   Collection Time: 08/01/20  3:58 PM  Result Value Ref Range   Glucose-Capillary 98 70 - 99 mg/dL  Glucose, capillary     Status: Abnormal   Collection Time: 08/01/20  7:39 PM  Result Value Ref Range   Glucose-Capillary 111 (H) 70 - 99 mg/dL  Glucose, capillary     Status: None   Collection Time: 08/01/20 11:20 PM  Result Value Ref Range   Glucose-Capillary 94 70 - 99 mg/dL  CBC with Differential/Platelet     Status: Abnormal   Collection Time: 08/02/20  1:49 AM  Result Value Ref Range   WBC 13.9 (H) 4.0 - 10.5 K/uL   RBC 3.83 (L) 4.22 - 5.81 MIL/uL   Hemoglobin 12.2 (L) 13.0 - 17.0 g/dL   HCT 37.4 (L) 39 - 52 %   MCV 97.7 80.0 - 100.0 fL   MCH 31.9 26.0 - 34.0 pg   MCHC 32.6 30.0 - 36.0 g/dL   RDW 15.3 11.5 - 15.5 %   Platelets 216 150 - 400 K/uL   nRBC 0.3 (H) 0.0 - 0.2 %     Neutrophils Relative % 57 %   Neutro Abs 7.9 (H) 1.7 - 7.7 K/uL   Lymphocytes Relative 37 %   Lymphs Abs 5.1 (H) 0.7 - 4.0 K/uL   Monocytes Relative 5 %   Monocytes Absolute 0.7 0.1 - 1.0 K/uL   Eosinophils Relative 0 %   Eosinophils Absolute 0.0 0.0 - 0.5 K/uL   Basophils Relative 1 %   Basophils Absolute 0.1 0.0 - 0.1 K/uL   nRBC 1 (H) 0 /100 WBC   Abs Immature Granulocytes 0.00 0.00 - 0.07 K/uL   Target Cells PRESENT   Comprehensive metabolic panel     Status: Abnormal   Collection Time: 08/02/20  1:49 AM  Result Value Ref Range   Sodium 145 135 - 145 mmol/L   Potassium 3.7 3.5 - 5.1 mmol/L   Chloride 116 (H) 98 - 111 mmol/L   CO2 18 (L) 22 - 32 mmol/L   Glucose, Bld 95 70 - 99 mg/dL  BUN 26 (H) 6 - 20 mg/dL   Creatinine, Ser 1.59 (H) 0.61 - 1.24 mg/dL   Calcium 10.6 (H) 8.9 - 10.3 mg/dL   Total Protein 8.2 (H) 6.5 - 8.1 g/dL   Albumin 3.0 (L) 3.5 - 5.0 g/dL   AST 35 15 - 41 U/L   ALT 26 0 - 44 U/L   Alkaline Phosphatase 83 38 - 126 U/L   Total Bilirubin 1.3 (H) 0.3 - 1.2 mg/dL   GFR, Estimated 49 (L) >60 mL/min   Anion gap 11 5 - 15  Magnesium     Status: None   Collection Time: 08/02/20  1:49 AM  Result Value Ref Range   Magnesium 1.9 1.7 - 2.4 mg/dL  Glucose, capillary     Status: None   Collection Time: 08/02/20  3:06 AM  Result Value Ref Range   Glucose-Capillary 76 70 - 99 mg/dL  Glucose, capillary     Status: Abnormal   Collection Time: 08/02/20  5:23 AM  Result Value Ref Range   Glucose-Capillary 100 (H) 70 - 99 mg/dL  Glucose, capillary     Status: Abnormal   Collection Time: 08/02/20  7:39 AM  Result Value Ref Range   Glucose-Capillary 120 (H) 70 - 99 mg/dL  Glucose, capillary     Status: None   Collection Time: 08/02/20 11:49 AM  Result Value Ref Range   Glucose-Capillary 98 70 - 99 mg/dL    Recent Results (from the past 240 hour(s))  Respiratory Panel by RT PCR (Flu A&B, Covid) - Nasopharyngeal Swab     Status: None   Collection Time: 07/27/20   9:29 PM   Specimen: Nasopharyngeal Swab  Result Value Ref Range Status   SARS Coronavirus 2 by RT PCR NEGATIVE NEGATIVE Final    Comment: (NOTE) SARS-CoV-2 target nucleic acids are NOT DETECTED.  The SARS-CoV-2 RNA is generally detectable in upper respiratoy specimens during the acute phase of infection. The lowest concentration of SARS-CoV-2 viral copies this assay can detect is 131 copies/mL. A negative result does not preclude SARS-Cov-2 infection and should not be used as the sole basis for treatment or other patient management decisions. A negative result may occur with  improper specimen collection/handling, submission of specimen other than nasopharyngeal swab, presence of viral mutation(s) within the areas targeted by this assay, and inadequate number of viral copies (<131 copies/mL). A negative result must be combined with clinical observations, patient history, and epidemiological information. The expected result is Negative.  Fact Sheet for Patients:  PinkCheek.be  Fact Sheet for Healthcare Providers:  GravelBags.it  This test is no t yet approved or cleared by the [place] FDA and  has been authorized for detection and/or diagnosis of SARS-CoV-2 by FDA under an Emergency Use Authorization (EUA). This EUA will remain  in effect (meaning this test can be used) for the duration of the COVID-19 declaration under Section 564(b)(1) of the Act, 21 U.S.C. section 360bbb-3(b)(1), unless the authorization is terminated or revoked sooner.     Influenza A by PCR NEGATIVE NEGATIVE Final   Influenza B by PCR NEGATIVE NEGATIVE Final    Comment: (NOTE) The Xpert Xpress SARS-CoV-2/FLU/RSV assay is intended as an aid in  the diagnosis of influenza from Nasopharyngeal swab specimens and  should not be used as a sole basis for treatment. Nasal washings and  aspirates are unacceptable for Xpert Xpress SARS-CoV-2/FLU/RSV   testing.  Fact Sheet for Patients: PinkCheek.be  Fact Sheet for Healthcare Providers: GravelBags.it  This  test is not yet approved or cleared by the [place] and  has been authorized for detection and/or diagnosis of SARS-CoV-2 by  FDA under an Emergency Use Authorization (EUA). This EUA will remain  in effect (meaning this test can be used) for the duration of the  Covid-19 declaration under Section 564(b)(1) of the Act, 21  U.S.C. section 360bbb-3(b)(1), unless the authorization is  terminated or revoked. Performed at [facility], [place] [address], [place], [place] [address]      Radiology Studies: No results found. CT CERVICAL SPINE WO CONTRAST  Final Result    MR BRAIN WO CONTRAST  Final Result    CT RENAL STONE STUDY  Final Result    DG Chest Portable 1 View  Final Result    CT HEAD WO CONTRAST  Final Result      Scheduled Meds: . folic acid  1 mg Oral Daily  . heparin  5,000 Units Subcutaneous Q8H  . lactulose  20 g Oral TID  . mouth rinse  15 mL Mouth Rinse BID  . multivitamin with minerals  1 tablet Oral Daily  . propranolol  10 mg Oral BID  . vitamin B-12  1,000 mcg Oral Daily   PRN Meds: haloperidol lactate, influenza vac split quadrivalent PF, LORazepam **OR** LORazepam, pneumococcal 23 valent vaccine Continuous Infusions: . thiamine injection       LOS: 6 days  Time spent: Greater than 50% of the 35 minute visit was spent in counseling/coordination of care for the patient as laid out in the A&P.   Dwyane Dee, MD [facility] 08/02/2020, 12:58 PM

## 2020-08-02 NOTE — Progress Notes (Signed)
Patient resting quietly at this time. Haldol effective. No restraints were needed. Safety precautions maintained. Bed alarm on.

## 2020-08-02 NOTE — Progress Notes (Signed)
Physical Therapy Treatment Patient Details Name: Calvin [name] MRN: [id number] DOB: 1961-07-07 Today's Date: 08/02/2020    History of Present Illness 59 yo male with onset of acute renal failure and acute metabolic enceph was brought to hosp after being confused and found wandering outside.  Noted elevated ammonia and elevated creatinine.   PMHx:  hep C, EtOH abuse, cirrhosis, hyperammonia, stroke, TIA, HTN, chronic hip pain, spinal surgery, cervical spine surgery, total joint replacement    PT Comments    Pt was seen for more mobility today, and is much more interactive and appropriate.  Had conversation about the names of his children, the next holiday and the plans for therapy.  He is expecting to have PT see again to walk farther, but recommend he have a chair behind him for support if he fatigues with the effort.  Follow up as time and pt allow to do more sessions to shorten his stay in rehab setting if possible.   Follow Up Recommendations  SNF     Equipment Recommendations  None recommended by PT    Recommendations for Other Services       Precautions / Restrictions Precautions Precautions: Fall Precaution Comments: recovering clarity of speech Restrictions Weight Bearing Restrictions: No    Mobility  Bed Mobility Overal bed mobility: Needs Assistance Bed Mobility: Supine to Sit;Sit to Supine     Supine to sit: Min assist Sit to supine: Min assist      Transfers Overall transfer level: Needs assistance Equipment used: Rolling walker (2 wheeled);1 person hand held assist Transfers: Sit to/from Stand Sit to Stand: Min assist         General transfer comment: reminders for hand placement  Ambulation/Gait Ambulation/Gait assistance: Min assist Gait Distance (Feet): 35 Feet Assistive device: Rolling walker (2 wheeled);1 person hand held assist Gait Pattern/deviations: Step-through pattern;Wide base of support;Trunk flexed Gait velocity: reduced   General  Gait Details: pt is motivated but shuffled, requires care to have a place to sit   Stairs             Wheelchair Mobility    Modified Rankin (Stroke Patients Only)       Balance Overall balance assessment: Needs assistance Sitting-balance support: Feet supported Sitting balance-Leahy Scale: Fair     Standing balance support: Bilateral upper extremity supported;During functional activity Standing balance-Leahy Scale: Poor                              Cognition Arousal/Alertness: Lethargic;Awake/alert Behavior During Therapy: Flat affect Overall Cognitive Status: Impaired/Different from baseline Area of Impairment: Problem solving;Safety/judgement;Following commands                 Orientation Level: Situation Current Attention Level: Selective Memory: Decreased recall of precautions;Decreased short-term memory Following Commands: Follows one step commands inconsistently;Follows one step commands with increased time Safety/Judgement: Decreased awareness of safety;Decreased awareness of deficits Awareness: Intellectual Problem Solving: Slow processing;Requires verbal cues;Requires tactile cues General Comments: requires direct cues for mobility but responding well      Exercises General Exercises - Lower Extremity Long Arc Quad: Strengthening;10 reps Heel Slides: Strengthening;10 reps Hip ABduction/ADduction: Strengthening;10 reps Hip Flexion/Marching: AROM;AAROM;10 reps    General Comments General comments (skin integrity, edema, etc.): pt is more talkative, has sitter and trying to move more spontaneously      Pertinent Vitals/Pain Pain Assessment: Faces Pain Score: 0-No pain    Home Living  Prior Function            PT Goals (current goals can now be found in the care plan section) Acute Rehab PT Goals Patient Stated Goal: verbalizing with some low level speech and less clear Progress towards PT goals:  Progressing toward goals    Frequency    Min 3X/week      PT Plan Current plan remains appropriate    Co-evaluation              AM-PAC PT "6 Clicks" Mobility   Outcome Measure  Help needed turning from your back to your side while in a flat bed without using bedrails?: A Little Help needed moving from lying on your back to sitting on the side of a flat bed without using bedrails?: A Little Help needed moving to and from a bed to a chair (including a wheelchair)?: A Little Help needed standing up from a chair using your arms (e.g., wheelchair or bedside chair)?: A Little Help needed to walk in hospital room?: A Little Help needed climbing 3-5 steps with a railing? : Total 6 Click Score: 16    End of Session Equipment Utilized During Treatment: Gait belt Activity Tolerance: Patient tolerated treatment well Patient left: in bed;with call bell/phone within reach;with bed alarm set;with nursing/sitter in room Nurse Communication: Mobility status PT Visit Diagnosis: Unsteadiness on feet (R26.81);Muscle weakness (generalized) (M62.81);Difficulty in walking, not elsewhere classified (R26.2)     Time: 0413-6438 PT Time Calculation (min) (ACUTE ONLY): 24 min  Charges:                   Ramond Dial 08/02/2020, 3:53 PM  Mee Hives, PT MS Acute Rehab Dept. Number: [facility] [place]

## 2020-08-03 DIAGNOSIS — N182 Chronic kidney disease, stage 2 (mild): Secondary | ICD-10-CM | POA: Diagnosis not present

## 2020-08-03 DIAGNOSIS — N179 Acute kidney failure, unspecified: Secondary | ICD-10-CM | POA: Diagnosis not present

## 2020-08-03 LAB — CBC WITH DIFFERENTIAL/PLATELET
Abs Immature Granulocytes: 0.06 10*3/uL (ref 0.00–0.07)
Basophils Absolute: 0.1 10*3/uL (ref 0.0–0.1)
Basophils Relative: 1 %
Eosinophils Absolute: 0.2 10*3/uL (ref 0.0–0.5)
Eosinophils Relative: 2 %
HCT: 36.1 % — ABNORMAL LOW (ref 39.0–52.0)
Hemoglobin: 11.8 g/dL — ABNORMAL LOW (ref 13.0–17.0)
Immature Granulocytes: 1 %
Lymphocytes Relative: 44 %
Lymphs Abs: 5.2 10*3/uL — ABNORMAL HIGH (ref 0.7–4.0)
MCH: 32.2 pg (ref 26.0–34.0)
MCHC: 32.7 g/dL (ref 30.0–36.0)
MCV: 98.4 fL (ref 80.0–100.0)
Monocytes Absolute: 0.9 10*3/uL (ref 0.1–1.0)
Monocytes Relative: 8 %
Neutro Abs: 5.1 10*3/uL (ref 1.7–7.7)
Neutrophils Relative %: 44 %
Platelets: 230 10*3/uL (ref 150–400)
RBC: 3.67 MIL/uL — ABNORMAL LOW (ref 4.22–5.81)
RDW: 15.3 % (ref 11.5–15.5)
WBC: 11.6 10*3/uL — ABNORMAL HIGH (ref 4.0–10.5)
nRBC: 0.2 % (ref 0.0–0.2)

## 2020-08-03 LAB — COMPREHENSIVE METABOLIC PANEL
ALT: 28 U/L (ref 0–44)
AST: 39 U/L (ref 15–41)
Albumin: 2.9 g/dL — ABNORMAL LOW (ref 3.5–5.0)
Alkaline Phosphatase: 79 U/L (ref 38–126)
Anion gap: 10 (ref 5–15)
BUN: 26 mg/dL — ABNORMAL HIGH (ref 6–20)
CO2: 19 mmol/L — ABNORMAL LOW (ref 22–32)
Calcium: 10.3 mg/dL (ref 8.9–10.3)
Chloride: 111 mmol/L (ref 98–111)
Creatinine, Ser: 1.56 mg/dL — ABNORMAL HIGH (ref 0.61–1.24)
GFR, Estimated: 51 mL/min — ABNORMAL LOW (ref >60)
Glucose, Bld: 143 mg/dL — ABNORMAL HIGH (ref 70–99)
Potassium: 3.3 mmol/L — ABNORMAL LOW (ref 3.5–5.1)
Sodium: 140 mmol/L (ref 135–145)
Total Bilirubin: 1 mg/dL (ref 0.3–1.2)
Total Protein: 7.7 g/dL (ref 6.5–8.1)

## 2020-08-03 LAB — GLUCOSE, CAPILLARY: Glucose-Capillary: 93 mg/dL (ref 70–99)

## 2020-08-03 LAB — MAGNESIUM: Magnesium: 2 mg/dL (ref 1.7–2.4)

## 2020-08-03 MED ORDER — POTASSIUM CHLORIDE CRYS ER 20 MEQ PO TBCR
40.0000 meq | EXTENDED_RELEASE_TABLET | Freq: Once | ORAL | Status: AC
  Administered 2020-08-03: 40 meq via ORAL
  Filled 2020-08-03: qty 2

## 2020-08-03 NOTE — TOC Initial Note (Signed)
Transition of Care [facility]) - Initial/Assessment Note    Patient Details  Name: Calvin [name] MRN: [id number] Date of Birth: 1961-06-29  [facility] CM/SW Contact:    Calvin Ingles, RN Phone Number: [phone]  08/03/2020, 9:55 AM  Clinical Narrative:                 [facility] consulted for SNF placement. CM at bedside with patient and brother Calvin [name]. Brother states that he wants to make decisions for his brother to ensure that he receives the best care.CM explained to brother that legally TOC must contact the wife who is the next of kin. Brother states that he is working with an Forensic psychologist to be declared the Media planner. Brother ask patient is he wants him to take care of him and make decisions. The patient nods yes. CM asked patient orientation questions. Patient is oriented to self and is able to verbalize who the president is. He thinks the year is 30 and can not verbalize where he is or why he is here. CM made brother and patient aware that wife would need to be contacted. TOC will continue to follow.  Expected Discharge Plan: Skilled Nursing Facility Barriers to Discharge: Continued Medical Work up   Patient Goals and CMS Choice Patient states their goals for this hospitalization and ongoing recovery are:: Patient states that he wants to get better CMS Medicare.gov Compare Post Acute Care list provided to:: Patient Represenative (must comment) Choice offered to / list presented to : Spouse Calvin [name])  Expected Discharge Plan and Services Expected Discharge Plan: [place] In-house Referral: NA Discharge Planning Services: CM Consult Post Acute Care Choice: [place] Living arrangements for the past 2 months: Single Family Home                 DME Arranged: N/A DME Agency: NA       HH Arranged: NA HH Agency: NA        Prior Living Arrangements/Services Living arrangements for the past 2 months: Single Family  Home Lives with:: Self Patient language and need for interpreter reviewed:: Yes Do you feel safe going back to the place where you live?:  (patient is confused does not answer question)      Need for Family Participation in Patient Care: Yes (Comment) Care giver support system in place?: Yes (comment)   Criminal Activity/Legal Involvement Pertinent to Current Situation/Hospitalization: No - Comment as needed  Activities of Daily Living      Permission Sought/Granted   Permission granted to share information with : No              Emotional Assessment Appearance:: Appears stated age Attitude/Demeanor/Rapport: Unable to Assess (confused) Affect (typically observed): Unable to Assess Orientation: : Oriented to Self Alcohol / Substance Use: Alcohol Use Psych Involvement: No (comment)  Admission diagnosis:  Metabolic encephalopathy [R15.40] ARF (acute renal failure) (HCC) [N17.9] Acute kidney injury ([place]) [N17.9] Alcohol withdrawal syndrome, with delirium ([place]) [F10.231] Patient Active Problem List   Diagnosis Date Noted  . Acute encephalopathy 08/01/2020  . Physical deconditioning 08/01/2020  . Metabolic encephalopathy   . ARF (acute renal failure) ([place]) 07/27/2020  . Acute renal failure superimposed on stage 2 chronic kidney disease ([place]) 04/02/2020  . Hyperkalemia 04/02/2020  . Hallucinations 04/02/2020  . Anemia of chronic disease 04/02/2020  . Chronic hepatitis C without hepatic coma ([place]) 08/09/2019  . Ascites due to alcoholic cirrhosis ([place])   . SOB (shortness  of breath)   . Decompensated hepatic cirrhosis ([place]) 07/15/2019  . Essential hypertension 06/23/2016  . Alcohol abuse 06/23/2016  . Chronic left hip pain 06/23/2016  . Hyponatremia 06/23/2016  . Hyperlipidemia   . Blind right eye    PCP:  Calvin Reichmann, PA-C Pharmacy:   [facility], [place] [address] Phone: [phone] Fax: [phone]     Social Determinants of Health (SDOH) Interventions    Readmission Risk Interventions No flowsheet data found.

## 2020-08-03 NOTE — Progress Notes (Signed)
   08/03/20 1330  Clinical Encounter Type  Visited With Other (Comment)  Visit Type Other (Comment)  Referral From Social work  Consult/Referral To [employer]  Spiritual Encounters  Spiritual Needs Other (Comment)  Stress Factors  Patient Stress Factors None identified  Family Stress Factors None identified  Chaplain received call from SW that patient wanted to sign and have his AD notarized and witnessed today. Chaplain explained that a notary was not available until Monday. Chaplain will visit patient Monday AM to set up signing of AD.

## 2020-08-03 NOTE — Progress Notes (Signed)
PROGRESS NOTE    Calvin [name]   VQQ:[id number]  DOB: 14-Sep-1961  DOA: 07/27/2020     7  PCP: Jenny Reichmann, PA-C  CC: Altered mental status  Hospital Course: Calvin [name] is a 59 year old male with history of hepatitis C, chronic alcohol abuse, liver cirrhosis who was found to be wandering around the road by his family and was brought to the emergency department.  As per the family, he was found to be confused for a few days.  On presentation, he was not alert or oriented.  CT head, MRI of the brain done in the emergency department did not any acute intracranial abnormalities.  Patient was found to have severely elevated ammonia level, severe AKI with creatinine in the range of 8.  CK level was elevated.    Patient was admitted for the management of hepatic encephalopathy along with severe AKI.  Hospital course remarkable for persistent encephalopathy. PT/OT recommended skilled nursing facility.   Interval History:  Received another dose of haldol yesterday which seems to still help the most.  He is not exhibiting any signs of etoh w/d now for at least the past 2 days that I have been seeing him.  He's eating in bed this morning and mood is controlled. Still unsteady hands from deconditioning but otherwise okay.  Speech slurred some but able to understand.   Old records reviewed in assessment of this patient  ROS: Constitutional: negative for chills and fevers, Respiratory: negative for cough, Cardiovascular: negative for chest pain and Gastrointestinal: negative for abdominal pain  Assessment & Plan: * Acute renal failure superimposed on stage 2 chronic kidney disease (HCC) - Baseline creatinine fluctuating between 1-2.8 in the last year..  Presented with creatinine in the range of 8.  This is most likely from acute tubular necrosis from rhabdomyolysis.  Urine sodium was 40.   - CT renal study did not show any hydronephrosis or obstructive pathology but showed punctuate  nonobstructing stone in the lower right kidney. Patient also had a metabolic acidosis. He was on bicarb drip. Negative screening for Methyl alcohol, ethyleneglycol, acetone, isopropyl alcohol. - Continued improvement in the kidney function with IV fluids. Fluids discontinued now.  The kidney function is at baseline now  Acute encephalopathy -Multifactorial due to metabolic encephalopathy, hepatic encephalopathy, uremic encephalopathy -CT head unremarkable.  No report of seizures -mentation has been mostly improving however on 07/31/2020 he became more confused and agitated and was started on CIWA protocol - he is rather impulsive and remains encephalopathic despite correcting electrolytes, ammonia and other derangements; acting almost as Warnicke's; he has been started on high-dose thiamine and B1 level is pending  Physical deconditioning -Evaluated by PT, with recommendations for SNF - mentation seems stable over past 24 hours; this may very well be new baseline. He will benefit from at least rehab which will prevent further etoh use for now; hopeful that thiamine replacement will continue to help some as well - when bed/facility found for placement, patient is stable for discharge at this time   Chronic hepatitis C without hepatic coma ([place]) - not a good transplant or treatment candidate until abstinent from alchol  Decompensated hepatic cirrhosis ([place]) - Secondary to alcohol abuse and hepatitis C.  Mild elevated AST and ALT but this could be from elevated CK.   - On Lasix, spironolactone at home which will be continued on discharge.  Also on propanolol at home,most likely for portal hypertension  Alcohol abuse -Continue on CIWA protocol -continue thiamine,  folic acid, multivitamin  Essential hypertension -Controlled for now, continue propranolol   Antimicrobials: None  DVT prophylaxis: HSQ Code Status: Full Family Communication: None present Disposition Plan: Status is:  Inpatient  Remains inpatient appropriate because:Unsafe d/c plan   Dispo: The patient is from: Home              Anticipated d/c is to: SNF              Anticipated d/c date is: when bed available              Patient currently is medically stable to d/c.  Objective: Blood pressure 109/70, pulse 89, temperature 98.6 F (37 C), temperature source Oral, resp. rate 14, height 5\' 7"  (1.702 m), weight 69.9 kg, SpO2 97 %.  Examination: General appearance: Speech has slightly improved this morning, still slurring some.  Less impulsive; laying in bed eating  Head: Normocephalic, without obvious abnormality, atraumatic Eyes: EOMI Lungs: clear to auscultation bilaterally Heart: regular rate and rhythm and S1, S2 normal Abdomen: thin, soft, NT, ND, BS present Extremities: no edema Skin: mobility and turgor normal Neurologic: no asterixis or tremor; redirects easily.  No focal deficits appreciated  Consultants:   none  Procedures:   none  Data Reviewed: I have personally reviewed following labs and imaging studies Results for orders placed or performed during the hospital encounter of 07/27/20 (from the past 24 hour(s))  Glucose, capillary     Status: Abnormal   Collection Time: 08/02/20  4:14 PM  Result Value Ref Range   Glucose-Capillary 101 (H) 70 - 99 mg/dL  CBC with Differential/Platelet     Status: Abnormal   Collection Time: 08/03/20  1:43 AM  Result Value Ref Range   WBC 11.6 (H) 4.0 - 10.5 K/uL   RBC 3.67 (L) 4.22 - 5.81 MIL/uL   Hemoglobin 11.8 (L) 13.0 - 17.0 g/dL   HCT 36.1 (L) 39 - 52 %   MCV 98.4 80.0 - 100.0 fL   MCH 32.2 26.0 - 34.0 pg   MCHC 32.7 30.0 - 36.0 g/dL   RDW 15.3 11.5 - 15.5 %   Platelets 230 150 - 400 K/uL   nRBC 0.2 0.0 - 0.2 %   Neutrophils Relative % 44 %   Neutro Abs 5.1 1.7 - 7.7 K/uL   Lymphocytes Relative 44 %   Lymphs Abs 5.2 (H) 0.7 - 4.0 K/uL   Monocytes Relative 8 %   Monocytes Absolute 0.9 0.1 - 1.0 K/uL   Eosinophils Relative 2 %     Eosinophils Absolute 0.2 0.0 - 0.5 K/uL   Basophils Relative 1 %   Basophils Absolute 0.1 0.0 - 0.1 K/uL   Immature Granulocytes 1 %   Abs Immature Granulocytes 0.06 0.00 - 0.07 K/uL  Comprehensive metabolic panel     Status: Abnormal   Collection Time: 08/03/20  1:43 AM  Result Value Ref Range   Sodium 140 135 - 145 mmol/L   Potassium 3.3 (L) 3.5 - 5.1 mmol/L   Chloride 111 98 - 111 mmol/L   CO2 19 (L) 22 - 32 mmol/L   Glucose, Bld 143 (H) 70 - 99 mg/dL   BUN 26 (H) 6 - 20 mg/dL   Creatinine, Ser 1.56 (H) 0.61 - 1.24 mg/dL   Calcium 10.3 8.9 - 10.3 mg/dL   Total Protein 7.7 6.5 - 8.1 g/dL   Albumin 2.9 (L) 3.5 - 5.0 g/dL   AST 39 15 - 41 U/L   ALT 28  0 - 44 U/L   Alkaline Phosphatase 79 38 - 126 U/L   Total Bilirubin 1.0 0.3 - 1.2 mg/dL   GFR, Estimated 51 (L) >60 mL/min   Anion gap 10 5 - 15  Magnesium     Status: None   Collection Time: 08/03/20  1:43 AM  Result Value Ref Range   Magnesium 2.0 1.7 - 2.4 mg/dL  Glucose, capillary     Status: None   Collection Time: 08/03/20  3:58 AM  Result Value Ref Range   Glucose-Capillary 93 70 - 99 mg/dL    Recent Results (from the past 240 hour(s))  Respiratory Panel by RT PCR (Flu A&B, Covid) - Nasopharyngeal Swab     Status: None   Collection Time: 07/27/20  9:29 PM   Specimen: Nasopharyngeal Swab  Result Value Ref Range Status   SARS Coronavirus 2 by RT PCR NEGATIVE NEGATIVE Final    Comment: (NOTE) SARS-CoV-2 target nucleic acids are NOT DETECTED.  The SARS-CoV-2 RNA is generally detectable in upper respiratoy specimens during the acute phase of infection. The lowest concentration of SARS-CoV-2 viral copies this assay can detect is 131 copies/mL. A negative result does not preclude SARS-Cov-2 infection and should not be used as the sole basis for treatment or other patient management decisions. A negative result may occur with  improper specimen collection/handling, submission of specimen other than nasopharyngeal swab,  presence of viral mutation(s) within the areas targeted by this assay, and inadequate number of viral copies (<131 copies/mL). A negative result must be combined with clinical observations, patient history, and epidemiological information. The expected result is Negative.  Fact Sheet for Patients:  PinkCheek.be  Fact Sheet for Healthcare Providers:  GravelBags.it  This test is no t yet approved or cleared by the [place] FDA and  has been authorized for detection and/or diagnosis of SARS-CoV-2 by FDA under an Emergency Use Authorization (EUA). This EUA will remain  in effect (meaning this test can be used) for the duration of the COVID-19 declaration under Section 564(b)(1) of the Act, 21 U.S.C. section 360bbb-3(b)(1), unless the authorization is terminated or revoked sooner.     Influenza A by PCR NEGATIVE NEGATIVE Final   Influenza B by PCR NEGATIVE NEGATIVE Final    Comment: (NOTE) The Xpert Xpress SARS-CoV-2/FLU/RSV assay is intended as an aid in  the diagnosis of influenza from Nasopharyngeal swab specimens and  should not be used as a sole basis for treatment. Nasal washings and  aspirates are unacceptable for Xpert Xpress SARS-CoV-2/FLU/RSV  testing.  Fact Sheet for Patients: PinkCheek.be  Fact Sheet for Healthcare Providers: GravelBags.it  This test is not yet approved or cleared by the [place] FDA and  has been authorized for detection and/or diagnosis of SARS-CoV-2 by  FDA under an Emergency Use Authorization (EUA). This EUA will remain  in effect (meaning this test can be used) for the duration of the  Covid-19 declaration under Section 564(b)(1) of the Act, 21  U.S.C. section 360bbb-3(b)(1), unless the authorization is  terminated or revoked. Performed at [facility], [place] [address], [place], [place] [address]      Radiology  Studies: No results found. CT CERVICAL SPINE WO CONTRAST  Final Result    MR BRAIN WO CONTRAST  Final Result    CT RENAL STONE STUDY  Final Result    DG Chest Portable 1 View  Final Result    CT HEAD WO CONTRAST  Final Result  Scheduled Meds: . folic acid  1 mg Oral Daily  . heparin  5,000 Units Subcutaneous Q8H  . lactulose  20 g Oral TID  . mouth rinse  15 mL Mouth Rinse BID  . multivitamin with minerals  1 tablet Oral Daily  . propranolol  10 mg Oral BID  . vitamin B-12  1,000 mcg Oral Daily   PRN Meds: haloperidol lactate, influenza vac split quadrivalent PF, LORazepam **OR** LORazepam, pneumococcal 23 valent vaccine Continuous Infusions: . thiamine injection 250 mg (08/02/20 1400)     LOS: 7 days  Time spent: Greater than 50% of the 35 minute visit was spent in counseling/coordination of care for the patient as laid out in the A&P.   Dwyane Dee, MD [facility] 08/03/2020, 12:30 PM

## 2020-08-03 NOTE — NC FL2 (Signed)
[place] MEDICAID FL2 LEVEL OF CARE SCREENING TOOL     IDENTIFICATION  Patient Name: Calvin [name] Birthdate: November 06, 1960 Sex: male Admission Date (Current Location): 07/27/2020  [facility] and [place] Number:  Herbalist and Address:  [facility], [place] [address], [place], [place] [address]      Provider Number: [id number]  Attending Physician Name and Address:  Dwyane Dee, MD  Relative Name and Phone Number:   Arne Cleveland [phone]    Current Level of Care: Hospital Recommended Level of Care: [place] Prior Approval Number:    Date Approved/Denied:   PASRR Number:   [id number]  Discharge Plan: SNF    Current Diagnoses: Patient Active Problem List   Diagnosis Date Noted  . Acute encephalopathy 08/01/2020  . Physical deconditioning 08/01/2020  . Metabolic encephalopathy   . ARF (acute renal failure) ([place]) 07/27/2020  . Acute renal failure superimposed on stage 2 chronic kidney disease ([place]) 04/02/2020  . Hyperkalemia 04/02/2020  . Hallucinations 04/02/2020  . Anemia of chronic disease 04/02/2020  . Chronic hepatitis C without hepatic coma ([place]) 08/09/2019  . Ascites due to alcoholic cirrhosis ([place])   . SOB (shortness of breath)   . Decompensated hepatic cirrhosis ([place]) 07/15/2019  . Essential hypertension 06/23/2016  . Alcohol abuse 06/23/2016  . Chronic left hip pain 06/23/2016  . Hyponatremia 06/23/2016  . Hyperlipidemia   . Blind right eye     Orientation RESPIRATION BLADDER Height & Weight     Self  Normal Incontinent, External catheter Weight: 69.9 kg Height:  5\' 7"  (170.2 cm)  BEHAVIORAL SYMPTOMS/MOOD NEUROLOGICAL BOWEL NUTRITION STATUS  Other (Comment) (confusion/ agitation)  (n/a) Incontinent Diet (2 gram sodium diet)  AMBULATORY STATUS COMMUNICATION OF NEEDS Skin   Extensive Assist Non-Verbally (unable to speak clearly but nods appropiately) Normal                        Personal Care Assistance Level of Assistance  Bathing, Feeding, Dressing, Total care Bathing Assistance: Maximum assistance Feeding assistance: Maximum assistance Dressing Assistance: Maximum assistance Total Care Assistance: Maximum assistance   Functional Limitations Info  Sight, Hearing, Speech Sight Info: Adequate Hearing Info: Adequate Speech Info: Impaired    SPECIAL CARE FACTORS FREQUENCY  PT (By licensed PT), OT (By licensed OT)     PT Frequency: 5X OT Frequency: 5X            Contractures Contractures Info: Not present    Additional Factors Info  Code Status, Allergies, Psychotropic, Insulin Sliding Scale, Isolation Precautions, Suctioning Needs Code Status Info: Full Allergies Info: No know drug allergies Psychotropic Info: Ativan as needed for agitation Insulin Sliding Scale Info: see d/c summary for sliding scale info Isolation Precautions Info: n/a Suctioning Needs: n/a   Current Medications (08/03/2020):  This is the current hospital active medication list Current Facility-Administered Medications  Medication Dose Route Frequency Provider Last Rate Last Admin  . folic acid (FOLVITE) tablet 1 mg  1 mg Oral Daily Adhikari, Amrit, MD   1 mg at 08/03/20 1038  . haloperidol lactate (HALDOL) injection 2 mg  2 mg Intravenous Q6H PRN Dwyane Dee, MD   2 mg at 08/03/20 0533  . heparin injection 5,000 Units  5,000 Units Subcutaneous Q8H Rise Patience, MD   5,000 Units at 08/03/20 0533  . influenza vac split quadrivalent PF (FLUARIX) injection 0.5 mL  0.5 mL Intramuscular Prior to discharge Shelly Coss, MD      .  lactulose (CHRONULAC) 10 GM/15ML solution 20 g  20 g Oral TID Shelly Coss, MD   20 g at 08/03/20 1038  . LORazepam (ATIVAN) tablet 1-4 mg  1-4 mg Oral Q1H PRN Vernelle Emerald, MD   2 mg at 08/01/20 1617   Or  . LORazepam (ATIVAN) injection 1-4 mg  1-4 mg Intravenous Q1H PRN Vernelle Emerald, MD   2 mg at 08/01/20 0506  . MEDLINE  mouth rinse  15 mL Mouth Rinse BID Shelly Coss, MD   15 mL at 08/03/20 1039  . multivitamin with minerals tablet 1 tablet  1 tablet Oral Daily Shelly Coss, MD   1 tablet at 08/03/20 1038  . pneumococcal 23 valent vaccine (PNEUMOVAX-23) injection 0.5 mL  0.5 mL Intramuscular Prior to discharge Shelly Coss, MD      . propranolol (INDERAL) tablet 10 mg  10 mg Oral BID Shelly Coss, MD   10 mg at 08/03/20 1038  . thiamine (B-1) 250 mg in sodium chloride 0.9 % 50 mL IVPB  250 mg Intravenous Daily Dwyane Dee, MD 100 mL/hr at 08/02/20 1400 250 mg at 08/02/20 1400  . vitamin B-12 (CYANOCOBALAMIN) tablet 1,000 mcg  1,000 mcg Oral Daily Dwyane Dee, MD   1,000 mcg at 08/03/20 1039     Discharge Medications: Please see discharge summary for a list of discharge medications.  Relevant Imaging Results:  Relevant Lab Results:   Additional Information ss# [id number]  Angelita Ingles, RN

## 2020-08-03 NOTE — TOC Progression Note (Addendum)
Transition of Care [facility]) - Progression Note    Patient Details  Name: Calvin [name] MRN: [id number] Date of Birth: [birth year]  Transition of Care [facility]) CM/SW [place], RN Phone Number: [phone]  08/03/2020, 10:05 AM  Clinical Narrative:    CM called wife Sukhman Martine @ [phone]. Wife answered and made aware that patient needs rehab. Wife is agreeable to rehab and gave verbal consent for CM to initiate bed search and then she would look at the list to determine where patient will go. Wife states that she is available and can be contacted at any time. Brother [name] has been updated to make aware that wife has been notified. TOC will continue to follow.   1058 Patient info has been faxed our for bed request.    Expected Discharge Plan: Skilled Nursing Facility Barriers to Discharge: Continued Medical Work up  Expected Discharge Plan and Services Expected Discharge Plan: [place] In-house Referral: NA Discharge Planning Services: CM Consult Post Acute Care Choice: [place] Living arrangements for the past 2 months: Single Family Home                 DME Arranged: N/A DME Agency: NA       HH Arranged: NA HH Agency: NA         Social Determinants of Health (SDOH) Interventions    Readmission Risk Interventions No flowsheet data found.

## 2020-08-03 NOTE — Plan of Care (Signed)
Patient remains confused.  Calm at this time. Following commands. Safety precautions maintained.

## 2020-08-03 NOTE — Plan of Care (Signed)

## 2020-08-04 DIAGNOSIS — F1010 Alcohol abuse, uncomplicated: Secondary | ICD-10-CM | POA: Diagnosis not present

## 2020-08-04 DIAGNOSIS — N182 Chronic kidney disease, stage 2 (mild): Secondary | ICD-10-CM | POA: Diagnosis not present

## 2020-08-04 DIAGNOSIS — N179 Acute kidney failure, unspecified: Secondary | ICD-10-CM | POA: Diagnosis not present

## 2020-08-04 MED ORDER — POTASSIUM CHLORIDE CRYS ER 20 MEQ PO TBCR
40.0000 meq | EXTENDED_RELEASE_TABLET | Freq: Once | ORAL | Status: AC
  Administered 2020-08-04: 40 meq via ORAL
  Filled 2020-08-04: qty 2

## 2020-08-04 NOTE — Plan of Care (Signed)

## 2020-08-04 NOTE — TOC Progression Note (Signed)
Transition of Care [facility]) - Progression Note    Patient Details  Name: Calvin [name] MRN: [id number] Date of Birth: 1960-10-24  Transition of Care [facility]) CM/SW [place], [place] Phone Number: 08/04/2020, 5:04 PM  Clinical Narrative:    CSW spoke with brother Stephone who is contact person and attempting to become power of attorney. He was given bed offer of [facility] and stated that he would not place his dog there and was a definite no. There are still some places pending, but many do not have admissions people on the weekend. SW will continue to follow.   Expected Discharge Plan: [place] Barriers to Discharge: Continued Medical Work up  Expected Discharge Plan and Services Expected Discharge Plan: [place] In-house Referral: NA Discharge Planning Services: CM Consult Post Acute Care Choice: [place] Living arrangements for the past 2 months: Single Family Home                 DME Arranged: N/A DME Agency: NA       HH Arranged: NA HH Agency: NA         Social Determinants of Health (SDOH) Interventions    Readmission Risk Interventions No flowsheet data found.

## 2020-08-04 NOTE — Progress Notes (Signed)
PROGRESS NOTE    Calvin [name]   ELF:[id number]  DOB: 1960-11-17  DOA: 07/27/2020     8  PCP: Jenny Reichmann, PA-C  CC: Altered mental status  Hospital Course: Calvin [name] is a 59 year old male with history of hepatitis C, chronic alcohol abuse, liver cirrhosis who was found to be wandering around the road by his family and was brought to the emergency department.  As per the family, he was found to be confused for a few days.  On presentation, he was not alert or oriented.  CT head, MRI of the brain done in the emergency department did not any acute intracranial abnormalities.  Patient was found to have severely elevated ammonia level, severe AKI with creatinine in the range of 8.  CK level was elevated.    Patient was admitted for the management of hepatic encephalopathy along with severe AKI.  Hospital course remarkable for persistent encephalopathy. PT/OT recommended skilled nursing facility.   Interval History:  Sitting up in chair bedside this morning.  Mentation seems to be a little better today and he was seen eating his food.    Old records reviewed in assessment of this patient  ROS: Constitutional: negative for chills and fevers, Respiratory: negative for cough, Cardiovascular: negative for chest pain and Gastrointestinal: negative for abdominal pain  Assessment & Plan: * Acute renal failure superimposed on stage 2 chronic kidney disease (HCC) - Baseline creatinine fluctuating between 1-2.8 in the last year..  Presented with creatinine in the range of 8.  This is most likely from acute tubular necrosis from rhabdomyolysis.  Urine sodium was 40.   - CT renal study did not show any hydronephrosis or obstructive pathology but showed punctuate nonobstructing stone in the lower right kidney. Patient also had a metabolic acidosis. He was on bicarb drip. Negative screening for Methyl alcohol, ethyleneglycol, acetone, isopropyl alcohol. - Continued improvement in the kidney  function with IV fluids. Fluids discontinued now.  The kidney function is at baseline now  Acute encephalopathy -Multifactorial due to metabolic encephalopathy, hepatic encephalopathy, uremic encephalopathy -CT head unremarkable.  No report of seizures -mentation has been mostly improving however on 07/31/2020 he became more confused and agitated and was started on CIWA protocol - he is rather impulsive and remains encephalopathic despite correcting electrolytes, ammonia and other derangements; acting almost as Warnicke's; he has been started on high-dose thiamine and B1 level is pending; turn around too long, sample may be lost  Physical deconditioning -Evaluated by PT, with recommendations for SNF - mentation seems stable over past 24 hours; this may very well be new baseline. He will benefit from at least rehab which will prevent further etoh use for now; hopeful that thiamine replacement will continue to help some as well - when bed/facility found for placement, patient is stable for discharge at this time   Chronic hepatitis C without hepatic coma ([place]) - not a good transplant or treatment candidate until abstinent from alchol  Decompensated hepatic cirrhosis ([place]) - Secondary to alcohol abuse and hepatitis C.  Mild elevated AST and ALT but this could be from elevated CK.   - On Lasix, spironolactone at home which will be continued on discharge.  Also on propanolol at home,most likely for portal hypertension  Alcohol abuse -Continue on CIWA protocol -continue thiamine, folic acid, multivitamin  Essential hypertension -Controlled for now, continue propranolol   Antimicrobials: None  DVT prophylaxis: HSQ Code Status: Full Family Communication: None present Disposition Plan: Status is: Inpatient  Remains inpatient appropriate because:Unsafe d/c plan and awaiting rehab  Dispo: The patient is from: Home              Anticipated d/c is to: SNF              Anticipated d/c date  is: when bed available              Patient currently is medically stable to d/c.  Objective: Blood pressure 102/75, pulse 84, temperature 98.4 F (36.9 C), temperature source Oral, resp. rate 14, height 5\' 7"  (1.702 m), weight 69.9 kg, SpO2 97 %.  Examination: General appearance: Speech still slurred some but continues to improve a little each day.  Mentation also seems improved today Head: Normocephalic, without obvious abnormality, atraumatic Eyes: EOMI Lungs: clear to auscultation bilaterally Heart: regular rate and rhythm and S1, S2 normal Abdomen: thin, soft, NT, ND, BS present Extremities: no edema Skin: mobility and turgor normal Neurologic: no asterixis or tremor; redirects easily.  No focal deficits appreciated.  Diffusely weak  Consultants:   none  Procedures:   none  Data Reviewed: I have personally reviewed following labs and imaging studies No results found for this or any previous visit (from the past 24 hour(s)).  Recent Results (from the past 240 hour(s))  Respiratory Panel by RT PCR (Flu A&B, Covid) - Nasopharyngeal Swab     Status: None   Collection Time: 07/27/20  9:29 PM   Specimen: Nasopharyngeal Swab  Result Value Ref Range Status   SARS Coronavirus 2 by RT PCR NEGATIVE NEGATIVE Final    Comment: (NOTE) SARS-CoV-2 target nucleic acids are NOT DETECTED.  The SARS-CoV-2 RNA is generally detectable in upper respiratoy specimens during the acute phase of infection. The lowest concentration of SARS-CoV-2 viral copies this assay can detect is 131 copies/mL. A negative result does not preclude SARS-Cov-2 infection and should not be used as the sole basis for treatment or other patient management decisions. A negative result may occur with  improper specimen collection/handling, submission of specimen other than nasopharyngeal swab, presence of viral mutation(s) within the areas targeted by this assay, and inadequate number of viral copies (<131 copies/mL).  A negative result must be combined with clinical observations, patient history, and epidemiological information. The expected result is Negative.  Fact Sheet for Patients:  PinkCheek.be  Fact Sheet for Healthcare Providers:  GravelBags.it  This test is no t yet approved or cleared by the [place] FDA and  has been authorized for detection and/or diagnosis of SARS-CoV-2 by FDA under an Emergency Use Authorization (EUA). This EUA will remain  in effect (meaning this test can be used) for the duration of the COVID-19 declaration under Section 564(b)(1) of the Act, 21 U.S.C. section 360bbb-3(b)(1), unless the authorization is terminated or revoked sooner.     Influenza A by PCR NEGATIVE NEGATIVE Final   Influenza B by PCR NEGATIVE NEGATIVE Final    Comment: (NOTE) The Xpert Xpress SARS-CoV-2/FLU/RSV assay is intended as an aid in  the diagnosis of influenza from Nasopharyngeal swab specimens and  should not be used as a sole basis for treatment. Nasal washings and  aspirates are unacceptable for Xpert Xpress SARS-CoV-2/FLU/RSV  testing.  Fact Sheet for Patients: PinkCheek.be  Fact Sheet for Healthcare Providers: GravelBags.it  This test is not yet approved or cleared by the [place] FDA and  has been authorized for detection and/or diagnosis of SARS-CoV-2 by  FDA under an Emergency Use Authorization (EUA). This EUA will  remain  in effect (meaning this test can be used) for the duration of the  Covid-19 declaration under Section 564(b)(1) of the Act, 21  U.S.C. section 360bbb-3(b)(1), unless the authorization is  terminated or revoked. Performed at [facility], [place] [address], [place], [place] [address]      Radiology Studies: No results found. CT CERVICAL SPINE WO CONTRAST  Final Result    MR BRAIN WO CONTRAST  Final Result    CT RENAL  STONE STUDY  Final Result    DG Chest Portable 1 View  Final Result    CT HEAD WO CONTRAST  Final Result      Scheduled Meds: . folic acid  1 mg Oral Daily  . heparin  5,000 Units Subcutaneous Q8H  . lactulose  20 g Oral TID  . mouth rinse  15 mL Mouth Rinse BID  . multivitamin with minerals  1 tablet Oral Daily  . potassium chloride  40 mEq Oral Once  . propranolol  10 mg Oral BID  . vitamin B-12  1,000 mcg Oral Daily   PRN Meds: haloperidol lactate, influenza vac split quadrivalent PF, pneumococcal 23 valent vaccine Continuous Infusions: . thiamine injection 250 mg (08/04/20 0939)     LOS: 8 days  Time spent: Greater than 50% of the 35 minute visit was spent in counseling/coordination of care for the patient as laid out in the A&P.   Dwyane Dee, MD [facility] 08/04/2020, 3:03 PM

## 2020-08-05 ENCOUNTER — Inpatient Hospital Stay (HOSPITAL_COMMUNITY): Payer: Medicare Other

## 2020-08-05 DIAGNOSIS — N182 Chronic kidney disease, stage 2 (mild): Secondary | ICD-10-CM | POA: Diagnosis not present

## 2020-08-05 DIAGNOSIS — F1010 Alcohol abuse, uncomplicated: Secondary | ICD-10-CM | POA: Diagnosis not present

## 2020-08-05 DIAGNOSIS — N179 Acute kidney failure, unspecified: Secondary | ICD-10-CM | POA: Diagnosis not present

## 2020-08-05 DIAGNOSIS — R5381 Other malaise: Secondary | ICD-10-CM | POA: Diagnosis not present

## 2020-08-05 MED ORDER — ACETAMINOPHEN 325 MG PO TABS
650.0000 mg | ORAL_TABLET | Freq: Four times a day (QID) | ORAL | Status: DC | PRN
  Administered 2020-08-05: 650 mg via ORAL
  Filled 2020-08-05: qty 2

## 2020-08-05 MED ORDER — PIPERACILLIN-TAZOBACTAM 3.375 G IVPB: 3.3750 g | Freq: Three times a day (TID) | INTRAVENOUS | Status: DC

## 2020-08-05 MED ORDER — VANCOMYCIN HCL 500 MG/100ML IV SOLN
500.0000 mg | Freq: Two times a day (BID) | INTRAVENOUS | Status: DC
  Filled 2020-08-05: qty 100

## 2020-08-05 MED ORDER — PIPERACILLIN-TAZOBACTAM 3.375 G IVPB 30 MIN
3.3750 g | Freq: Once | INTRAVENOUS | Status: AC
  Administered 2020-08-06: 3.375 g via INTRAVENOUS
  Filled 2020-08-05: qty 50

## 2020-08-05 MED ORDER — VANCOMYCIN HCL 1500 MG/300ML IV SOLN
1500.0000 mg | Freq: Once | INTRAVENOUS | Status: AC
  Administered 2020-08-06: 1500 mg via INTRAVENOUS
  Filled 2020-08-05: qty 300

## 2020-08-05 NOTE — Progress Notes (Signed)
Floor coverage  Notified by RN that patient is febrile with temperature 100.4 F.  Repeat temperature 100.2 F.  Slightly tachycardic with heart rate in the 100-110s.  Blood pressure 127/89.  Patient resting comfortably and has no complaints.  -Stat labs including CBC, lactate, procalcitonin, UA, urine culture, blood culture x2 -Stat chest x-ray -Given concern for infection/SIRS/possible sepsis, broad-spectrum antibiotics have been started at this time, pending results of work-up mentioned above.

## 2020-08-05 NOTE — Progress Notes (Signed)
Pharmacy Antibiotic Note  Calvin [name] is a 59 y.o. male admitted on 07/27/2020 with sepsis.  Pharmacy has been consulted for vancomycin and zosyn dosing.  Plan: Zosyn 3.375 gm IV q8 hours Vancomycin 1500 mg IV x 1 then 500 mg IV q12 hours F/u renal function, cultures and clinical course  Height: 5\' 7"  (170.2 cm) Weight: 69.9 kg (154 lb 1.6 oz) IBW/kg (Calculated) : 66.1  Temp (24hrs), Avg:98.9 F (37.2 C), Min:97.8 F (36.6 C), Max:100.4 F (38 C)  Recent Labs  Lab 07/30/20 0208 07/31/20 0451 08/02/20 0149 08/03/20 0143  WBC 10.8*  --  13.9* 11.6*  CREATININE 1.49* 1.47* 1.59* 1.56*    Estimated Creatinine Clearance: 47.1 mL/min (A) (by C-G formula based on SCr of 1.56 mg/dL (H)).    No Known Allergies   Thank you for allowing pharmacy to be a part of this patient's care.  Excell Seltzer Poteet 08/05/2020 11:15 PM

## 2020-08-05 NOTE — Progress Notes (Signed)
Pt reported to have temperature of 100.4 per tech. [facility] called and new orders received. Pt not in distress. Will continue to monitor.

## 2020-08-05 NOTE — Progress Notes (Signed)
PROGRESS NOTE    Calvin [name]   YBO:[id number]  DOB: [birth year]  DOA: 07/27/2020     9  PCP: Jenny Reichmann, PA-C  CC: Altered mental status  Hospital Course: Calvin [name] is a 59 year old male with history of hepatitis C, chronic alcohol abuse, liver cirrhosis who was found to be wandering around the road by his family and was brought to the emergency department.  As per the family, he was found to be confused for a few days.  On presentation, he was not alert or oriented.  CT head, MRI of the brain done in the emergency department did not any acute intracranial abnormalities.  Patient was found to have severely elevated ammonia level, severe AKI with creatinine in the range of 8.  CK level was elevated.    Patient was admitted for the management of hepatic encephalopathy along with severe AKI.  Hospital course remarkable for persistent encephalopathy. PT/OT recommended skilled nursing facility.   Interval History:  He is telling me this morning that he called the police who then called the nursing station.  Unsure if this is real or not, will discuss with staff; his mentation seems at baseline and this is likely his new normal. His brother declined the SNF offer to [facility] and we are now awaiting further options.  Old records reviewed in assessment of this patient  ROS: Constitutional: negative for chills and fevers, Respiratory: negative for cough, Cardiovascular: negative for chest pain and Gastrointestinal: negative for abdominal pain  Assessment & Plan: * Acute renal failure superimposed on stage 2 chronic kidney disease (HCC) - Baseline creatinine fluctuating between 1-2.8 in the last year..  Presented with creatinine in the range of 8.  This is most likely from acute tubular necrosis from rhabdomyolysis.  Urine sodium was 40.   - CT renal study did not show any hydronephrosis or obstructive pathology but showed punctuate nonobstructing stone in the lower right  kidney. Patient also had a metabolic acidosis. He was on bicarb drip. Negative screening for Methyl alcohol, ethyleneglycol, acetone, isopropyl alcohol. - Continued improvement in the kidney function with IV fluids. Fluids discontinued now.  The kidney function is at baseline now  Acute encephalopathy -Multifactorial due to metabolic encephalopathy, hepatic encephalopathy, uremic encephalopathy -CT head unremarkable.  No report of seizures -mentation has been mostly improving however on 07/31/2020 he became more confused and agitated and was started on CIWA protocol - he is rather impulsive and remains encephalopathic despite correcting electrolytes, ammonia and other derangements; acting almost as Warnicke's; he has been started on high-dose thiamine and B1 level is pending; turn around too long, sample may be lost -Continue high-dose thiamine  Physical deconditioning -Evaluated by PT, with recommendations for SNF - mentation now stable; this may very well be new baseline. He will benefit from at least rehab which will prevent further etoh use for now; hopeful that thiamine replacement will continue to help some as well - when bed/facility found for placement, patient is stable for discharge at this time   Chronic hepatitis C without hepatic coma ([place]) - not a good transplant or treatment candidate until abstinent from alchol  Decompensated hepatic cirrhosis ([place]) - Secondary to alcohol abuse and hepatitis C.  Mild elevated AST and ALT but this could be from elevated CK.   - On Lasix, spironolactone at home which will be continued on discharge.  Also on propanolol at home,most likely for portal hypertension  Alcohol abuse -continue thiamine, folic acid, multivitamin  Essential  hypertension -Controlled for now, continue propranolol   Antimicrobials: None  DVT prophylaxis: HSQ Code Status: Full Family Communication: None present Disposition Plan: Status is: Inpatient  Remains  inpatient appropriate because:Unsafe d/c plan and awaiting rehab  Dispo: The patient is from: Home              Anticipated d/c is to: SNF              Anticipated d/c date is: when bed available              Patient currently is medically stable to d/c.  Objective: Blood pressure 135/90, pulse 88, temperature 97.8 F (36.6 C), temperature source Oral, resp. rate 15, height 5\' 7"  (1.702 m), weight 69.9 kg, SpO2 96 %.  Examination: General appearance: Speech still slurred some but continues to improve a little each day.  Mentation also seems improved today Head: Normocephalic, without obvious abnormality, atraumatic Eyes: EOMI Lungs: clear to auscultation bilaterally Heart: regular rate and rhythm and S1, S2 normal Abdomen: thin, soft, NT, ND, BS present Extremities: no edema Skin: mobility and turgor normal Neurologic: no asterixis or tremor; redirects easily.  No focal deficits appreciated.  Diffusely weak.  No dysmetria  Consultants:   none  Procedures:   none  Data Reviewed: I have personally reviewed following labs and imaging studies No results found for this or any previous visit (from the past 24 hour(s)).  Recent Results (from the past 240 hour(s))  Respiratory Panel by RT PCR (Flu A&B, Covid) - Nasopharyngeal Swab     Status: None   Collection Time: 07/27/20  9:29 PM   Specimen: Nasopharyngeal Swab  Result Value Ref Range Status   SARS Coronavirus 2 by RT PCR NEGATIVE NEGATIVE Final    Comment: (NOTE) SARS-CoV-2 target nucleic acids are NOT DETECTED.  The SARS-CoV-2 RNA is generally detectable in upper respiratoy specimens during the acute phase of infection. The lowest concentration of SARS-CoV-2 viral copies this assay can detect is 131 copies/mL. A negative result does not preclude SARS-Cov-2 infection and should not be used as the sole basis for treatment or other patient management decisions. A negative result may occur with  improper specimen  collection/handling, submission of specimen other than nasopharyngeal swab, presence of viral mutation(s) within the areas targeted by this assay, and inadequate number of viral copies (<131 copies/mL). A negative result must be combined with clinical observations, patient history, and epidemiological information. The expected result is Negative.  Fact Sheet for Patients:  PinkCheek.be  Fact Sheet for Healthcare Providers:  GravelBags.it  This test is no t yet approved or cleared by the [place] FDA and  has been authorized for detection and/or diagnosis of SARS-CoV-2 by FDA under an Emergency Use Authorization (EUA). This EUA will remain  in effect (meaning this test can be used) for the duration of the COVID-19 declaration under Section 564(b)(1) of the Act, 21 U.S.C. section 360bbb-3(b)(1), unless the authorization is terminated or revoked sooner.     Influenza A by PCR NEGATIVE NEGATIVE Final   Influenza B by PCR NEGATIVE NEGATIVE Final    Comment: (NOTE) The Xpert Xpress SARS-CoV-2/FLU/RSV assay is intended as an aid in  the diagnosis of influenza from Nasopharyngeal swab specimens and  should not be used as a sole basis for treatment. Nasal washings and  aspirates are unacceptable for Xpert Xpress SARS-CoV-2/FLU/RSV  testing.  Fact Sheet for Patients: PinkCheek.be  Fact Sheet for Healthcare Providers: GravelBags.it  This test is not yet approved  or cleared by the [place] and  has been authorized for detection and/or diagnosis of SARS-CoV-2 by  FDA under an Emergency Use Authorization (EUA). This EUA will remain  in effect (meaning this test can be used) for the duration of the  Covid-19 declaration under Section 564(b)(1) of the Act, 21  U.S.C. section 360bbb-3(b)(1), unless the authorization is  terminated or revoked. Performed at [facility], [place] [address] [place],  [address]      Radiology Studies: No results found. CT CERVICAL SPINE WO CONTRAST  Final Result    MR BRAIN WO CONTRAST  Final Result    CT RENAL STONE STUDY  Final Result    DG Chest Portable 1 View  Final Result    CT HEAD WO CONTRAST  Final Result      Scheduled Meds: . folic acid  1 mg Oral Daily  . heparin  5,000 Units Subcutaneous Q8H  . lactulose  20 g Oral TID  . mouth rinse  15 mL Mouth Rinse BID  . multivitamin with minerals  1 tablet Oral Daily  . propranolol  10 mg Oral BID  . vitamin B-12  1,000 mcg Oral Daily   PRN Meds: haloperidol lactate, influenza vac split quadrivalent PF, pneumococcal 23 valent vaccine Continuous Infusions: . thiamine injection 250 mg (08/05/20 0847)     LOS: 9 days  Time spent: Greater than 50% of the 35 minute visit was spent in counseling/coordination of care for the patient as laid out in the A&P.   Dwyane Dee, MD [facility] 08/05/2020, 1:50 PM

## 2020-08-05 NOTE — Plan of Care (Signed)

## 2020-08-06 DIAGNOSIS — N182 Chronic kidney disease, stage 2 (mild): Secondary | ICD-10-CM | POA: Diagnosis not present

## 2020-08-06 DIAGNOSIS — N179 Acute kidney failure, unspecified: Secondary | ICD-10-CM | POA: Diagnosis not present

## 2020-08-06 LAB — CBC
HCT: 34.2 % — ABNORMAL LOW (ref 39.0–52.0)
Hemoglobin: 11.5 g/dL — ABNORMAL LOW (ref 13.0–17.0)
MCH: 32.2 pg (ref 26.0–34.0)
MCHC: 33.6 g/dL (ref 30.0–36.0)
MCV: 95.8 fL (ref 80.0–100.0)
Platelets: 206 10*3/uL (ref 150–400)
RBC: 3.57 MIL/uL — ABNORMAL LOW (ref 4.22–5.81)
RDW: 14.8 % (ref 11.5–15.5)
WBC: 12.2 10*3/uL — ABNORMAL HIGH (ref 4.0–10.5)
nRBC: 0 % (ref 0.0–0.2)

## 2020-08-06 LAB — BLOOD CULTURE ID PANEL (REFLEXED) - BCID2

## 2020-08-06 LAB — CBC WITH DIFFERENTIAL/PLATELET
Abs Immature Granulocytes: 0.06 10*3/uL (ref 0.00–0.07)
Basophils Absolute: 0.1 10*3/uL (ref 0.0–0.1)
Basophils Relative: 1 %
Eosinophils Absolute: 0.1 10*3/uL (ref 0.0–0.5)
Eosinophils Relative: 0 %
HCT: 34.8 % — ABNORMAL LOW (ref 39.0–52.0)
Hemoglobin: 11.6 g/dL — ABNORMAL LOW (ref 13.0–17.0)
Immature Granulocytes: 1 %
Lymphocytes Relative: 30 %
Lymphs Abs: 3.7 10*3/uL (ref 0.7–4.0)
MCH: 32.4 pg (ref 26.0–34.0)
MCHC: 33.3 g/dL (ref 30.0–36.0)
MCV: 97.2 fL (ref 80.0–100.0)
Monocytes Absolute: 1.2 10*3/uL — ABNORMAL HIGH (ref 0.1–1.0)
Monocytes Relative: 10 %
Neutro Abs: 7.2 10*3/uL (ref 1.7–7.7)
Neutrophils Relative %: 58 %
Platelets: 209 10*3/uL (ref 150–400)
RBC: 3.58 MIL/uL — ABNORMAL LOW (ref 4.22–5.81)
RDW: 15.1 % (ref 11.5–15.5)
WBC: 12.3 10*3/uL — ABNORMAL HIGH (ref 4.0–10.5)
nRBC: 0 % (ref 0.0–0.2)

## 2020-08-06 LAB — URINALYSIS, ROUTINE W REFLEX MICROSCOPIC
Bilirubin Urine: NEGATIVE
Glucose, UA: NEGATIVE mg/dL
Ketones, ur: NEGATIVE mg/dL
Nitrite: POSITIVE — AB
Protein, ur: 100 mg/dL — AB
Specific Gravity, Urine: 1.017 (ref 1.005–1.030)
WBC, UA: >50 WBC/hpf — ABNORMAL HIGH (ref 0–5)
pH: 5 (ref 5.0–8.0)

## 2020-08-06 LAB — PROCALCITONIN: Procalcitonin: 0.15 ng/mL

## 2020-08-06 LAB — LACTIC ACID, PLASMA: Lactic Acid, Venous: 1 mmol/L (ref 0.5–1.9)

## 2020-08-06 MED ORDER — SODIUM CHLORIDE 0.9 % IV SOLN
2.0000 g | INTRAVENOUS | Status: DC
  Administered 2020-08-06 – 2020-08-07 (×2): 2 g via INTRAVENOUS
  Filled 2020-08-06 (×3): qty 20

## 2020-08-06 MED ORDER — THIAMINE HCL 100 MG PO TABS
100.0000 mg | ORAL_TABLET | Freq: Every day | ORAL | Status: DC
  Administered 2020-08-07 – 2020-08-09 (×2): 100 mg via ORAL
  Filled 2020-08-06 (×2): qty 1

## 2020-08-06 NOTE — Progress Notes (Signed)
1/4 anaerobic, gnr ecoli, no resistance PHARMACY - PHYSICIAN COMMUNICATION CRITICAL VALUE ALERT - BLOOD CULTURE IDENTIFICATION (BCID)  Calvin [name] is an 59 y.o. male who presented to [place] on 07/27/2020  Assessment:  32 yom presenting with AMS. BCx now growing Ecoli in 1/4 bottles, no resistance detected.  Name of physician (or Provider) Contacted: [place], D  Current antibiotics: none  Changes to prescribed antibiotics recommended:  Add ceftriaxone 2g IV q24h  Results for orders placed or performed during the hospital encounter of 07/27/20  Blood Culture ID Panel (Reflexed) (Collected: 08/06/2020  1:14 AM)  Result Value Ref Range   Enterococcus faecalis NOT DETECTED NOT DETECTED   Enterococcus Faecium NOT DETECTED NOT DETECTED   Listeria monocytogenes NOT DETECTED NOT DETECTED   Staphylococcus species NOT DETECTED NOT DETECTED   Staphylococcus aureus (BCID) NOT DETECTED NOT DETECTED   Staphylococcus epidermidis NOT DETECTED NOT DETECTED   Staphylococcus lugdunensis NOT DETECTED NOT DETECTED   Streptococcus species NOT DETECTED NOT DETECTED   Streptococcus agalactiae NOT DETECTED NOT DETECTED   Streptococcus pneumoniae NOT DETECTED NOT DETECTED   Streptococcus pyogenes NOT DETECTED NOT DETECTED   A.calcoaceticus-baumannii NOT DETECTED NOT DETECTED   Bacteroides fragilis NOT DETECTED NOT DETECTED   Enterobacterales DETECTED (A) NOT DETECTED   Enterobacter cloacae complex NOT DETECTED NOT DETECTED   Escherichia coli DETECTED (A) NOT DETECTED   Klebsiella aerogenes NOT DETECTED NOT DETECTED   Klebsiella oxytoca NOT DETECTED NOT DETECTED   Klebsiella pneumoniae NOT DETECTED NOT DETECTED   Proteus species NOT DETECTED NOT DETECTED   Salmonella species NOT DETECTED NOT DETECTED   Serratia marcescens NOT DETECTED NOT DETECTED   Haemophilus influenzae NOT DETECTED NOT DETECTED   Neisseria meningitidis NOT DETECTED NOT DETECTED   Pseudomonas aeruginosa NOT DETECTED NOT  DETECTED   Stenotrophomonas maltophilia NOT DETECTED NOT DETECTED   Candida albicans NOT DETECTED NOT DETECTED   Candida auris NOT DETECTED NOT DETECTED   Candida glabrata NOT DETECTED NOT DETECTED   Candida krusei NOT DETECTED NOT DETECTED   Candida parapsilosis NOT DETECTED NOT DETECTED   Candida tropicalis NOT DETECTED NOT DETECTED   Cryptococcus neoformans/gattii NOT DETECTED NOT DETECTED   CTX-M ESBL NOT DETECTED NOT DETECTED   Carbapenem resistance IMP NOT DETECTED NOT DETECTED   Carbapenem resistance KPC NOT DETECTED NOT DETECTED   Carbapenem resistance NDM NOT DETECTED NOT DETECTED   Carbapenem resist OXA 48 LIKE NOT DETECTED NOT DETECTED   Carbapenem resistance VIM NOT DETECTED NOT DETECTED    Arturo Morton, PharmD, BCPS Please check AMION for all [facility] contact numbers Clinical Pharmacist 08/06/2020 4:29 PM

## 2020-08-06 NOTE — TOC Progression Note (Addendum)
Transition of Care [facility]) - Progression Note    Patient Details  Name: ZHAIRE LOCKER MRN: [id number] Date of Birth: [birth year]  Transition of Care [facility]) CM/SW Contact  Angelita Ingles, RN Phone Number: [phone]  08/06/2020, 11:03 AM  Clinical Narrative:    CM has attempted to call wife who is legally the next of kin. Message has been left for wife to call CM. CM attempted to call brother [name] with no answer. CM will continue to attempt to contact wife brother is not the decision maker and the patient states that he does not want his brother [name] to be his decision maker. Patient states this is for a show. My brother smokes crack. This am patient is alert to self, time, place but disoriented to situation. Patient states that he would like CM to call his wife or daughter to help with decisions. Patient also states that he is able to make his own decisions.    Expected Discharge Plan: [place] Barriers to Discharge: Continued Medical Work up  Expected Discharge Plan and Services Expected Discharge Plan: [place] In-house Referral: NA Discharge Planning Services: CM Consult Post Acute Care Choice: [place] Living arrangements for the past 2 months: Single Family Home                 DME Arranged: N/A DME Agency: NA       HH Arranged: NA HH Agency: NA         Social Determinants of Health (SDOH) Interventions    Readmission Risk Interventions No flowsheet data found.

## 2020-08-06 NOTE — Consult Note (Signed)
WOC Nurse Consult Note: Reason for Consult:wounds on penis Wound type: unclear etiology; partial thickness wound on the anterior surface of the penis shaft  Pressure Injury POA: NA Measurement: 1cm x 1cm x 0.1cm  Wound bed:100% pink Drainage (amount, consistency, odor) scant  Periwound: intact, non circumcised  Dressing procedure/placement/frequency: Xeroform gauze and dry dressing.   Unclear etiology would need work up if concerned for any type of STD lesions.   Discussed POC with patient and bedside nurse.  Re consult if needed, will not follow at this time. Thanks  Leyana Whidden [employer], RN,CWOCN, CNS, [place] [phone])

## 2020-08-06 NOTE — Progress Notes (Signed)
Physical Therapy Treatment Patient Details Name: Calvin [name] MRN: [id number] DOB: 08/16/1961 Today's Date: 08/06/2020    History of Present Illness 59 yo male with onset of acute renal failure and acute metabolic enceph was brought to hosp after being confused and found wandering outside.  Noted elevated ammonia and elevated creatinine.   PMHx:  hep C, EtOH abuse, cirrhosis, hyperammonia, stroke, TIA, HTN, chronic hip pain, spinal surgery, cervical spine surgery, total joint replacement    PT Comments    Pt was seen for mobility on RW, then to do strengthening on BLE's in chair.  Pt was set up with safety features, nursing aware and permitting his placement in the chair.  Follow acutely for these goals prepared, and focus on his unsafe use of walker with tendency to walk without it.  Hopefully after rehab will go home with no device needed.   Follow Up Recommendations  SNF     Equipment Recommendations  None recommended by PT    Recommendations for Other Services       Precautions / Restrictions Precautions Precautions: Fall Precaution Comments: recovering clarity of speech Restrictions Weight Bearing Restrictions: No    Mobility  Bed Mobility Overal bed mobility: Needs Assistance Bed Mobility: Supine to Sit     Supine to sit: Min assist;Min guard     General bed mobility comments: cues for hand placement  Transfers Overall transfer level: Needs assistance Equipment used: Rolling walker (2 wheeled);1 person hand held assist Transfers: Sit to/from Stand Sit to Stand: Min assist         General transfer comment: reminders for hand placement  Ambulation/Gait Ambulation/Gait assistance: Min assist Gait Distance (Feet): 70 Feet (35 x 2) Assistive device: Rolling walker (2 wheeled);1 person hand held assist Gait Pattern/deviations: Step-through pattern;Wide base of support;Trunk flexed (wide turns) Gait velocity: reduced Gait velocity interpretation: <1.31  ft/sec, indicative of household ambulator General Gait Details: motivation and better attention to details of staying inside walker and turning with walker more carefully   Stairs             Wheelchair Mobility    Modified Rankin (Stroke Patients Only)       Balance Overall balance assessment: Needs assistance Sitting-balance support: Feet supported Sitting balance-Leahy Scale: Good   Postural control: Posterior lean;Right lateral lean Standing balance support: Bilateral upper extremity supported;During functional activity Standing balance-Leahy Scale: Poor Standing balance comment: requires attention from PT to direct walker and cue safety of set up to sit                            Cognition Arousal/Alertness: Awake/alert Behavior During Therapy: Flat affect Overall Cognitive Status: Impaired/Different from baseline                   Orientation Level: Situation                    Exercises General Exercises - Lower Extremity Ankle Circles/Pumps: AAROM;5 reps Long Arc Quad: Strengthening;10 reps Heel Slides: Strengthening;10 reps Hip ABduction/ADduction: Strengthening;10 reps    General Comments General comments (skin integrity, edema, etc.): pt is up to walk with assist, unsafe alone and willing to wait for staff to assist him per his report.  Assisted with chair pad alarm to sit up in recliner.      Pertinent Vitals/Pain Pain Assessment: No/denies pain Pain Score: 0-No pain    Home Living  Prior Function            PT Goals (current goals can now be found in the care plan section) Acute Rehab PT Goals Patient Stated Goal: minimal issue with hearing and understanding speed' PT Goal Formulation: With patient Progress towards PT goals: Progressing toward goals    Frequency    Min 3X/week      PT Plan Current plan remains appropriate    Co-evaluation              AM-PAC PT "6  Clicks" Mobility   Outcome Measure  Help needed turning from your back to your side while in a flat bed without using bedrails?: A Little Help needed moving from lying on your back to sitting on the side of a flat bed without using bedrails?: A Little Help needed moving to and from a bed to a chair (including a wheelchair)?: A Little Help needed standing up from a chair using your arms (e.g., wheelchair or bedside chair)?: A Little Help needed to walk in hospital room?: A Little Help needed climbing 3-5 steps with a railing? : Total 6 Click Score: 16    End of Session Equipment Utilized During Treatment: Gait belt Activity Tolerance: Patient tolerated treatment well Patient left: with call bell/phone within reach;with nursing/sitter in room;in chair;with chair alarm set Nurse Communication: Mobility status PT Visit Diagnosis: Unsteadiness on feet (R26.81);Muscle weakness (generalized) (M62.81);Difficulty in walking, not elsewhere classified (R26.2)     Time: 7078-6754 PT Time Calculation (min) (ACUTE ONLY): 28 min  Charges:  $Gait Training: 8-22 mins $Therapeutic Exercise: 8-22 mins                     Ramond Dial 08/06/2020, 4:57 PM  Mee Hives, PT MS Acute Rehab Dept. Number: [facility] [phone] and [place] [phone]

## 2020-08-06 NOTE — Progress Notes (Signed)
PROGRESS NOTE    Calvin [name]   CZY:[id number]  DOB: [birth year]  DOA: 07/27/2020     10  PCP: Jenny Reichmann, PA-C  CC: Altered mental status  Hospital Course: [name] is a 59 year old male with history of hepatitis C, chronic alcohol abuse, liver cirrhosis who was found to be wandering around the road by his family and was brought to the emergency department.  As per the family, he was found to be confused for a few days.  On presentation, he was not alert or oriented.  CT head, MRI of the brain done in the emergency department did not any acute intracranial abnormalities.  Patient was found to have severely elevated ammonia level, severe AKI with creatinine in the range of 8.  CK level was elevated.    Patient was admitted for the management of hepatic encephalopathy along with severe AKI.  Hospital course remarkable for persistent encephalopathy. PT/OT recommended skilled nursing facility. He was also started on high dose thiamine in case of possible B1 deficiency; lab was ordered but never resulted (probable lost sample).   His mentation did slowly improve throughout remainder of hospitalization.  Disposition was the biggest obstacle as initial facility offer was declined by family.   Interval History:  No events overnight.  More awake and alert this morning resting in bed.  He is wanting to go home to live with his wife.  There have been discrepancies regarding disposition and decision makers.  This is being looked into and clarified this morning.  Old records reviewed in assessment of this patient  ROS: Constitutional: negative for chills and fevers, Respiratory: negative for cough, Cardiovascular: negative for chest pain and Gastrointestinal: negative for abdominal pain  Assessment & Plan: * Acute renal failure superimposed on stage 2 chronic kidney disease (HCC)-resolved as of 08/06/2020 - Baseline creatinine fluctuating between 1-2.8 in the last year..  Presented  with creatinine in the range of 8.  This is most likely from acute tubular necrosis from rhabdomyolysis.  Urine sodium was 40.   - CT renal study did not show any hydronephrosis or obstructive pathology but showed punctuate nonobstructing stone in the lower right kidney. Patient also had a metabolic acidosis. He was on bicarb drip. Negative screening for Methyl alcohol, ethyleneglycol, acetone, isopropyl alcohol. - Continued improvement in the kidney function with IV fluids. Fluids discontinued now.  The kidney function is at baseline now  Acute encephalopathy-resolved as of 08/06/2020 -Multifactorial due to metabolic encephalopathy, hepatic encephalopathy, uremic encephalopathy -CT head unremarkable.  No report of seizures -mentation has been mostly improving however on 07/31/2020 he became more confused and agitated and was started on CIWA protocol - he is rather impulsive and remains encephalopathic despite correcting electrolytes, ammonia and other derangements; acting almost as Warnicke's; he has been started on high-dose thiamine and B1 level is pending; turn around too long, sample may be lost -s/p high dose thiamine course - continue on thiamine   Physical deconditioning -Evaluated by PT, with recommendations for SNF - mentation now stable; this may very well be new baseline. He will benefit from at least rehab which will prevent further etoh use for now; hopeful that thiamine replacement will continue to help some as well - when bed/facility found for placement, patient is stable for discharge at this time   Chronic hepatitis C without hepatic coma ([place]) - not a good transplant or treatment candidate until abstinent from alchol  Decompensated hepatic cirrhosis ([place]) - Secondary to alcohol abuse and  hepatitis C.  Mild elevated AST and ALT but this could be from elevated CK.   - On Lasix, spironolactone at home which will be continued on discharge.  Also on propanolol at home,most  likely for portal hypertension  Alcohol abuse -continue thiamine, folic acid, multivitamin  Essential hypertension -Controlled for now, continue propranolol   Antimicrobials: None  DVT prophylaxis: HSQ Code Status: Full Family Communication: None present Disposition Plan: Status is: Inpatient  Remains inpatient appropriate because:Unsafe d/c plan and awaiting rehab. Waiting for wife or brother to discuss discharge plans  Dispo: The patient is from: Home              Anticipated d/c is to: SNF vs home              Anticipated d/c date is: when bed available              Patient currently is medically stable to d/c.  Objective: Blood pressure 94/62, pulse 100, temperature 99.4 F (37.4 C), temperature source Oral, resp. rate 18, height 5\' 7"  (1.702 m), weight 69.9 kg, SpO2 97 %.  Examination: General appearance: Speech still slurred some but continues to improve a little each day.  Mentation also seems improved today Head: Normocephalic, without obvious abnormality, atraumatic Eyes: EOMI Lungs: clear to auscultation bilaterally Heart: regular rate and rhythm and S1, S2 normal Abdomen: thin, soft, NT, ND, BS present Extremities: no edema Skin: mobility and turgor normal Neurologic: no asterixis or tremor; redirects easily.  No focal deficits appreciated.  Diffusely weak.  No dysmetria  Consultants:   none  Procedures:   none  Data Reviewed: I have personally reviewed following labs and imaging studies Results for orders placed or performed during the hospital encounter of 07/27/20 (from the past 24 hour(s))  CBC     Status: Abnormal   Collection Time: 08/06/20  1:06 AM  Result Value Ref Range   WBC 12.2 (H) 4.0 - 10.5 K/uL   RBC 3.57 (L) 4.22 - 5.81 MIL/uL   Hemoglobin 11.5 (L) 13.0 - 17.0 g/dL   HCT 34.2 (L) 39 - 52 %   MCV 95.8 80.0 - 100.0 fL   MCH 32.2 26.0 - 34.0 pg   MCHC 33.6 30.0 - 36.0 g/dL   RDW 14.8 11.5 - 15.5 %   Platelets 206 150 - 400 K/uL    nRBC 0.0 0.0 - 0.2 %  Lactic acid, plasma     Status: None   Collection Time: 08/06/20  1:06 AM  Result Value Ref Range   Lactic Acid, Venous 1.0 0.5 - 1.9 mmol/L  Procalcitonin - Baseline     Status: None   Collection Time: 08/06/20  1:06 AM  Result Value Ref Range   Procalcitonin 0.15 ng/mL  CBC with Differential/Platelet     Status: Abnormal   Collection Time: 08/06/20  1:06 AM  Result Value Ref Range   WBC 12.3 (H) 4.0 - 10.5 K/uL   RBC 3.58 (L) 4.22 - 5.81 MIL/uL   Hemoglobin 11.6 (L) 13.0 - 17.0 g/dL   HCT 34.8 (L) 39 - 52 %   MCV 97.2 80.0 - 100.0 fL   MCH 32.4 26.0 - 34.0 pg   MCHC 33.3 30.0 - 36.0 g/dL   RDW 15.1 11.5 - 15.5 %   Platelets 209 150 - 400 K/uL   nRBC 0.0 0.0 - 0.2 %   Neutrophils Relative % 58 %   Neutro Abs 7.2 1.7 - 7.7 K/uL   Lymphocytes  Relative 30 %   Lymphs Abs 3.7 0.7 - 4.0 K/uL   Monocytes Relative 10 %   Monocytes Absolute 1.2 (H) 0.1 - 1.0 K/uL   Eosinophils Relative 0 %   Eosinophils Absolute 0.1 0.0 - 0.5 K/uL   Basophils Relative 1 %   Basophils Absolute 0.1 0.0 - 0.1 K/uL   Immature Granulocytes 1 %   Abs Immature Granulocytes 0.06 0.00 - 0.07 K/uL    Recent Results (from the past 240 hour(s))  Respiratory Panel by RT PCR (Flu A&B, Covid) - Nasopharyngeal Swab     Status: None   Collection Time: 07/27/20  9:29 PM   Specimen: Nasopharyngeal Swab  Result Value Ref Range Status   SARS Coronavirus 2 by RT PCR NEGATIVE NEGATIVE Final    Comment: (NOTE) SARS-CoV-2 target nucleic acids are NOT DETECTED.  The SARS-CoV-2 RNA is generally detectable in upper respiratoy specimens during the acute phase of infection. The lowest concentration of SARS-CoV-2 viral copies this assay can detect is 131 copies/mL. A negative result does not preclude SARS-Cov-2 infection and should not be used as the sole basis for treatment or other patient management decisions. A negative result may occur with  improper specimen collection/handling, submission of  specimen other than nasopharyngeal swab, presence of viral mutation(s) within the areas targeted by this assay, and inadequate number of viral copies (<131 copies/mL). A negative result must be combined with clinical observations, patient history, and epidemiological information. The expected result is Negative.  Fact Sheet for Patients:  PinkCheek.be  Fact Sheet for Healthcare Providers:  GravelBags.it  This test is no t yet approved or cleared by the [place] FDA and  has been authorized for detection and/or diagnosis of SARS-CoV-2 by FDA under an Emergency Use Authorization (EUA). This EUA will remain  in effect (meaning this test can be used) for the duration of the COVID-19 declaration under Section 564(b)(1) of the Act, 21 U.S.C. section 360bbb-3(b)(1), unless the authorization is terminated or revoked sooner.     Influenza A by PCR NEGATIVE NEGATIVE Final   Influenza B by PCR NEGATIVE NEGATIVE Final    Comment: (NOTE) The Xpert Xpress SARS-CoV-2/FLU/RSV assay is intended as an aid in  the diagnosis of influenza from Nasopharyngeal swab specimens and  should not be used as a sole basis for treatment. Nasal washings and  aspirates are unacceptable for Xpert Xpress SARS-CoV-2/FLU/RSV  testing.  Fact Sheet for Patients: PinkCheek.be  Fact Sheet for Healthcare Providers: GravelBags.it  This test is not yet approved or cleared by the [place] FDA and  has been authorized for detection and/or diagnosis of SARS-CoV-2 by  FDA under an Emergency Use Authorization (EUA). This EUA will remain  in effect (meaning this test can be used) for the duration of the  Covid-19 declaration under Section 564(b)(1) of the Act, 21  U.S.C. section 360bbb-3(b)(1), unless the authorization is  terminated or revoked. Performed at [facility], [place] [address],  [place], [place] [address]      Radiology Studies: DG CHEST PORT 1 VIEW  Result Date: 08/05/2020 CLINICAL DATA:  Fever EXAM: PORTABLE CHEST 1 VIEW COMPARISON:  07/27/2020 FINDINGS: The heart size and mediastinal contours are within normal limits. Both lungs are clear. The visualized skeletal structures are unremarkable. IMPRESSION: No active disease. Electronically Signed   By: Randa Ngo M.D.   On: 08/05/2020 23:24   DG CHEST PORT 1 VIEW  Final Result    CT CERVICAL SPINE WO CONTRAST  Final  Result    MR BRAIN WO CONTRAST  Final Result    CT RENAL STONE STUDY  Final Result    DG Chest Portable 1 View  Final Result    CT HEAD WO CONTRAST  Final Result      Scheduled Meds: . folic acid  1 mg Oral Daily  . heparin  5,000 Units Subcutaneous Q8H  . lactulose  20 g Oral TID  . mouth rinse  15 mL Mouth Rinse BID  . multivitamin with minerals  1 tablet Oral Daily  . propranolol  10 mg Oral BID  . thiamine  100 mg Oral Daily  . vitamin B-12  1,000 mcg Oral Daily   PRN Meds: acetaminophen, haloperidol lactate, influenza vac split quadrivalent PF, pneumococcal 23 valent vaccine Continuous Infusions:    LOS: 10 days  Time spent: Greater than 50% of the 35 minute visit was spent in counseling/coordination of care for the patient as laid out in the A&P.   Dwyane Dee, MD [facility] 08/06/2020, 10:36 AM

## 2020-08-07 ENCOUNTER — Inpatient Hospital Stay (HOSPITAL_COMMUNITY): Payer: Medicare Other

## 2020-08-07 DIAGNOSIS — N179 Acute kidney failure, unspecified: Secondary | ICD-10-CM | POA: Diagnosis not present

## 2020-08-07 DIAGNOSIS — N182 Chronic kidney disease, stage 2 (mild): Secondary | ICD-10-CM | POA: Diagnosis not present

## 2020-08-07 LAB — PHOSPHORUS: Phosphorus: 2.6 mg/dL (ref 2.5–4.6)

## 2020-08-07 LAB — COMPREHENSIVE METABOLIC PANEL
ALT: 29 U/L (ref 0–44)
AST: 35 U/L (ref 15–41)
Albumin: 3 g/dL — ABNORMAL LOW (ref 3.5–5.0)
Alkaline Phosphatase: 96 U/L (ref 38–126)
Anion gap: 9 (ref 5–15)
BUN: 22 mg/dL — ABNORMAL HIGH (ref 6–20)
CO2: 15 mmol/L — ABNORMAL LOW (ref 22–32)
Calcium: 10.5 mg/dL — ABNORMAL HIGH (ref 8.9–10.3)
Chloride: 109 mmol/L (ref 98–111)
Creatinine, Ser: 1.47 mg/dL — ABNORMAL HIGH (ref 0.61–1.24)
GFR, Estimated: 54 mL/min — ABNORMAL LOW (ref >60)
Glucose, Bld: 105 mg/dL — ABNORMAL HIGH (ref 70–99)
Potassium: 4.3 mmol/L (ref 3.5–5.1)
Sodium: 133 mmol/L — ABNORMAL LOW (ref 135–145)
Total Bilirubin: 0.9 mg/dL (ref 0.3–1.2)
Total Protein: 8 g/dL (ref 6.5–8.1)

## 2020-08-07 LAB — MAGNESIUM: Magnesium: 1.7 mg/dL (ref 1.7–2.4)

## 2020-08-07 LAB — CBC WITH DIFFERENTIAL/PLATELET
Abs Immature Granulocytes: 0.1 10*3/uL — ABNORMAL HIGH (ref 0.00–0.07)
Basophils Absolute: 0.1 10*3/uL (ref 0.0–0.1)
Basophils Relative: 1 %
Eosinophils Absolute: 0 10*3/uL (ref 0.0–0.5)
Eosinophils Relative: 0 %
HCT: 34.1 % — ABNORMAL LOW (ref 39.0–52.0)
Hemoglobin: 11.5 g/dL — ABNORMAL LOW (ref 13.0–17.0)
Immature Granulocytes: 1 %
Lymphocytes Relative: 24 %
Lymphs Abs: 3 10*3/uL (ref 0.7–4.0)
MCH: 31.9 pg (ref 26.0–34.0)
MCHC: 33.7 g/dL (ref 30.0–36.0)
MCV: 94.5 fL (ref 80.0–100.0)
Monocytes Absolute: 1.4 10*3/uL — ABNORMAL HIGH (ref 0.1–1.0)
Monocytes Relative: 11 %
Neutro Abs: 7.9 10*3/uL — ABNORMAL HIGH (ref 1.7–7.7)
Neutrophils Relative %: 63 %
Platelets: 188 10*3/uL (ref 150–400)
RBC: 3.61 MIL/uL — ABNORMAL LOW (ref 4.22–5.81)
RDW: 14.7 % (ref 11.5–15.5)
WBC: 12.6 10*3/uL — ABNORMAL HIGH (ref 4.0–10.5)
nRBC: 0 % (ref 0.0–0.2)

## 2020-08-07 LAB — LACTIC ACID, PLASMA: Lactic Acid, Venous: 1.2 mmol/L (ref 0.5–1.9)

## 2020-08-07 LAB — AMMONIA: Ammonia: 54 umol/L — ABNORMAL HIGH (ref 9–35)

## 2020-08-07 MED ORDER — ENSURE ENLIVE PO LIQD
237.0000 mL | Freq: Three times a day (TID) | ORAL | Status: DC
  Administered 2020-08-07 – 2020-08-09 (×4): 237 mL via ORAL
  Filled 2020-08-07: qty 237

## 2020-08-07 MED ORDER — FUROSEMIDE 20 MG PO TABS
20.0000 mg | ORAL_TABLET | Freq: Every day | ORAL | Status: DC
  Administered 2020-08-07 – 2020-08-09 (×2): 20 mg via ORAL
  Filled 2020-08-07 (×3): qty 1

## 2020-08-07 MED ORDER — LIDOCAINE HCL 1 % IJ SOLN
INTRAMUSCULAR | Status: AC
  Filled 2020-08-07: qty 20

## 2020-08-07 NOTE — TOC Progression Note (Addendum)
Transition of Care [facility]) - Progression Note    Patient Details  Name: Calvin [name] MRN: [id number] Date of Birth: 07-May-1961  Transition of Care [facility]) CM/SW Contact  Angelita Ingles, RN Phone Number: [phone]  08/07/2020, 1:43 PM  Clinical Narrative:    CM received return call from [name] (wife). Isa Rankin states that she is willing and wants to make decisions for her husband. Wife states that she does not wish to allow brother  [name] to make decisions and that she would be willing to allow Muhamad Serano to help with making decisions. Wife states that she will come to hospital to see patient today and she does not want any decision about discharge made before she can get here.   1500 CM at bedside to discuss discharge plan with patient. Patient states that he does not want to go to rehab facility. He states that he is able to manage at home. Patient is agreeable to wife coming in this evening to discuss plans of care with him.    Expected Discharge Plan: [place] Barriers to Discharge: Continued Medical Work up  Expected Discharge Plan and Services Expected Discharge Plan: [place] In-house Referral: NA Discharge Planning Services: CM Consult Post Acute Care Choice: [place] Living arrangements for the past 2 months: Single Family Home                 DME Arranged: N/A DME Agency: NA       HH Arranged: NA HH Agency: NA         Social Determinants of Health (SDOH) Interventions    Readmission Risk Interventions No flowsheet data found.

## 2020-08-07 NOTE — Progress Notes (Signed)
PROGRESS NOTE  SHYAM DAWSON  DOB: 05/28/1961  PCP: Jenny Reichmann, PA-C ERX:[id number]  DOA: 07/27/2020  LOS: 11 days   Chief Complaint  Patient presents with  . Altered Mental Status    Brief narrative: Mr. [name] is a 59 year old male with history of hepatitis C, chronic alcohol abuse, liver cirrhosis who was found to be wandering around the road by his family and was brought to the emergency department. As per the family, he was found to be confused for a few days.  On presentation, he was not alert or oriented.  CT head, MRI of the brain did not any acute intracranial abnormalities.  Labs showed severely elevated ammonia level, severe AKI with creatinine in the range of 8. CK level was elevated.   Patient was admitted for the management of hepatic encephalopathy along with severe AKI.  Hospital course was remarkable for persistent encephalopathy. PT/OT recommended skilled nursing facility. He was also started on high dose thiamine for possible B1 deficiency  His mentation did slowly improve throughout remainder of hospitalization.  Disposition was the biggest obstacle as initial facility offer was declined by family.  Subjective: Patient was seen and examined this morning.  Middle-aged African-American male.  Alert, awake, knows he is in the hospital, unable to answer questions appropriately.  Good mood.  Not in withdrawals. Chart reviewed.  On 11/15, patient has a positive urinalysis as well as E. coli growing in blood culture.  Assessment/Plan: Acute encephalopathy -Multifactorial due to metabolic encephalopathy, hepatic encephalopathy, uremic encephalopathy, UTI. -CT head unremarkable.  No report of seizures -mentation has been mostly improving however on 07/31/2020 he became more confused and agitated and was started on CIWA protocol - he is rather impulsive and remains intermittently encephalopathic despite correction of electrolytes, ammonia and other  derangements; acting almost as Warnicke's; he  was started on high-dose thiamine.  B1 level was sent but taking too long to turn around, sample may be lost -s/p high dose thiamine course. -continue on thiamine  -his mental status is currently at her new baseline.  AKI on CKD 2 metabolic acidosis -Baseline creatinine between 1-2.8 in the last year..  -Presented with creatinine in the range of 8. This is most likely from acute tubular necrosis from rhabdomyolysis. Urine sodium was 40.  - CT renal study did not show any hydronephrosis or obstructive pathology but showed punctuate nonobstructing stone in the lower right kidney. Patient also had a metabolic acidosis.He was onbicarb drip. Investigations negative screening for Methyl alcohol, ethyleneglycol, acetone, isopropyl alcohol. - Continued improvement in the kidney function with IV fluids.  Off fluids now. The kidney function is at baseline now  UTI E. coli bacteremia -on chart review I noted that patient had a temperature 100.4 on 11/14.  He had a urinalysis and blood culture sent.  Urinalysis positive for nitrite.  Blood culture and urine culture growing coli.   -He is currently on IV Rocephin which I will continue.  Pending final culture reports.  Physical deconditioning -Evaluated by PT, with recommendations for SNF  Chronic hepatitis C without hepatic coma - not a good transplant or treatment candidate until abstinent from alchol  Decompensated hepatic cirrhosis Portal hypertension Essential hypertension - liver cirrhosis secondary to alcohol abuse and hepatitis C. Mild elevated AST and ALT but this could be from elevated CK. -Home meds include propanolol, Lasix, spironolactone.   -Continue propanolol.  Blood pressure stable.  Resume Lasix today.  Keep Aldactone on hold.   -Seems to have distended  abdomen.  Will try ultrasound-guided paracentesis today.  Alcohol abuse -continue thiamine, folic acid,  multivitamin  Mobility: PT eval obtained. Code Status:   Code Status: Full Code  Nutritional status: Body mass index is 24.14 kg/m. Nutrition Problem: Inadequate oral intake Etiology: poor appetite Signs/Symptoms: meal completion < 25% Diet Order            Diet regular Room service appropriate? Yes; Fluid consistency: Thin  Diet effective now                DVT prophylaxis: heparin injection 5,000 Units Start: 07/28/20 1400   Antimicrobials:  Completed a course of IV Rocephin. Fluid: Not on IV fluid Consultants: None Family Communication:  None at bedside  Status is: Inpatient  Remains inpatient appropriate because: Unsafe discharge plan, also new blood culture and urine culture positivity   Dispo: The patient is from: Home              Anticipated d/c is to: Unclear at this time              Anticipated d/c date is: Unclear at this time              Patient currently is not medically stable to d/c.  Apparently, patient is unable to make his decision because of encephalopathy.  His wife is not reachable.  His brother is involved but patient does not want his brother to make the decision about his disposition.  Case management aware.  I will involve palliative care consultation as well.  Infusions:  . cefTRIAXone (ROCEPHIN)  IV Stopped (08/06/20 1800)    Scheduled Meds: . feeding supplement  237 mL Oral TID BM  . folic acid  1 mg Oral Daily  . furosemide  20 mg Oral Daily  . heparin  5,000 Units Subcutaneous Q8H  . lactulose  20 g Oral TID  . mouth rinse  15 mL Mouth Rinse BID  . multivitamin with minerals  1 tablet Oral Daily  . propranolol  10 mg Oral BID  . thiamine  100 mg Oral Daily  . vitamin B-12  1,000 mcg Oral Daily    Antimicrobials: Anti-infectives (From admission, onward)   Start     Dose/Rate Route Frequency Ordered Stop   08/06/20 1730  cefTRIAXone (ROCEPHIN) 2 g in sodium chloride 0.9 % 100 mL IVPB        2 g 200 mL/hr over 30 Minutes  Intravenous Every 24 hours 08/06/20 1630     08/06/20 1200  vancomycin (VANCOREADY) IVPB 500 mg/100 mL  Status:  Discontinued        500 mg 100 mL/hr over 60 Minutes Intravenous Every 12 hours 08/05/20 2319 08/06/20 0705   08/06/20 0800  piperacillin-tazobactam (ZOSYN) IVPB 3.375 g  Status:  Discontinued        3.375 g 12.5 mL/hr over 240 Minutes Intravenous Every 8 hours 08/05/20 2319 08/06/20 0705   08/06/20 0000  piperacillin-tazobactam (ZOSYN) IVPB 3.375 g        3.375 g 100 mL/hr over 30 Minutes Intravenous  Once 08/05/20 2310 08/06/20 0700   08/06/20 0000  vancomycin (VANCOREADY) IVPB 1500 mg/300 mL        1,500 mg 150 mL/hr over 120 Minutes Intravenous  Once 08/05/20 2311 08/06/20 0700      PRN meds: acetaminophen, haloperidol lactate, influenza vac split quadrivalent PF, pneumococcal 23 valent vaccine   Objective: Vitals:   08/06/20 2046 08/07/20 0316  BP: 122/71 119/68  Pulse: 100  96  Resp: 20 20  Temp: 99.2 F (37.3 C) 98.9 F (37.2 C)  SpO2: 98% 99%    Intake/Output Summary (Last 24 hours) at 08/07/2020 1153 Last data filed at 08/07/2020 0300 Gross per 24 hour  Intake 200 ml  Output 150 ml  Net 50 ml   Filed Weights   08/01/20 0105 08/02/20 0500 08/03/20 0414  Weight: 68.1 kg 67.6 kg 69.9 kg   Weight change:  Body mass index is 24.14 kg/m.   Physical Exam: General exam: Appears calm and comfortable.  Not in distress Skin: No rashes, lesions or ulcers. HEENT: Atraumatic, normocephalic, supple neck, no obvious bleeding Lungs: Clear to auscultation bilaterally CVS: Regular rate and rhythm, no murmur GI/Abd soft, distended slightly, bowel sound present. CNS: Alert, awake, oriented to place. Psychiatry: Mood appropriate Extremities: No pedal edema, no calf tenderness  Data Review: I have personally reviewed the laboratory data and studies available.  Recent Labs  Lab 08/02/20 0149 08/03/20 0143 08/06/20 0106 08/07/20 0834  WBC 13.9* 11.6* 12.3*   12.2* 12.6*  NEUTROABS 7.9* 5.1 7.2 7.9*  HGB 12.2* 11.8* 11.6*  11.5* 11.5*  HCT 37.4* 36.1* 34.8*  34.2* 34.1*  MCV 97.7 98.4 97.2  95.8 94.5  PLT 216 230 209  206 188   Recent Labs  Lab 08/02/20 0149 08/03/20 0143 08/07/20 0834  NA 145 140 133*  K 3.7 3.3* 4.3  CL 116* 111 109  CO2 18* 19* 15*  GLUCOSE 95 143* 105*  BUN 26* 26* 22*  CREATININE 1.59* 1.56* 1.47*  CALCIUM 10.6* 10.3 10.5*  MG 1.9 2.0 1.7  PHOS  --   --  2.6    F/u labs ordered.  Signed, Terrilee Croak, MD [facility] 08/07/2020

## 2020-08-07 NOTE — Progress Notes (Signed)
Initial Nutrition Assessment  DOCUMENTATION CODES:   Not applicable  INTERVENTION:   Recommend diet liberalization  Ensure Enlive po TID, each supplement provides 350 kcal and 20 grams of protein  MVI daily  NUTRITION DIAGNOSIS:   Inadequate oral intake related to poor appetite as evidenced by meal completion < 25%.    GOAL:   Patient will meet greater than or equal to 90% of their needs    MONITOR:   PO intake, Supplement acceptance, Weight trends, Labs, I & O's  REASON FOR ASSESSMENT:   LOS    ASSESSMENT:   Pt admitted with ARF on CKD and acute encephalopathy (multifactorial due to metabolic encephalopathy, hepatic encephalopathy, uremic encephalopathy). PMH significant for hepatitis C, EtOH abuse, and cirrhosis.  Pt unavailable at time of RD visit.   Per MD, pt's mentation has slowly improved and pt is stable for discharge. There are discrepancies regarding discharge plan. Pt would like to go home to live with his wife, but it is being recommended pt go to SNF.   Reviewed wt history. Pt weighed 79.3 kg on 07/11/20 and pt now weighs 154.1 kg. This indicates an 11.7% wt loss x1 month, which is severe and significant for time frame. Pt likely malnourished; however, unable to diagnose at this time without nutrition-focused physical exam.    PO Intake: 0-60% x 6 recorded meals (20% average meal intake)  UOP: 172ml documented x 24 hours  Labs: K+ 3.3 (L), Corrected Calcium 11.18 (H) Medications: folvite, chronulac, mvi, thiamine, vitamin B12  NUTRITION - FOCUSED PHYSICAL EXAM:  Unable to perform at this time; will attempt at follow-up.   Diet Order:   Diet Order            Diet 2 gram sodium Room service appropriate? No; Fluid consistency: Thin  Diet effective now                 EDUCATION NEEDS:   No education needs have been identified at this time  Skin:  Skin Assessment: Reviewed RN Assessment  Last BM:  11/14  Height:   Ht Readings from  Last 1 Encounters:  07/27/20 5\' 7"  (1.702 m)    Weight:   Wt Readings from Last 1 Encounters:  08/03/20 69.9 kg    BMI:  Body mass index is 24.14 kg/m.  Estimated Nutritional Needs:   Kcal:  2100-2300  Protein:  105-115 grams  Fluid:  >2L/d    Larkin Ina, MS, RD, LDN RD pager number and weekend/on-call pager number located in [place].

## 2020-08-07 NOTE — Progress Notes (Signed)
Patient presented to IR for a paracentesis. Limited ultrasound imaging did not show any fluid amenable to percutaneous drainage.   No IR procedure performed and the patient was sent back to his room. Dr. Annamaria Boots made aware. Images are saved in Epic.   Please call IR with any questions.  Soyla Dryer, [place] [phone] 08/07/2020, 1:38 PM

## 2020-08-08 DIAGNOSIS — N182 Chronic kidney disease, stage 2 (mild): Secondary | ICD-10-CM | POA: Diagnosis not present

## 2020-08-08 DIAGNOSIS — N179 Acute kidney failure, unspecified: Secondary | ICD-10-CM | POA: Diagnosis not present

## 2020-08-08 LAB — URINE CULTURE: Culture: 80000 — AB

## 2020-08-08 LAB — VITAMIN B1: Vitamin B1 (Thiamine): 100.8 nmol/L (ref 66.5–200.0)

## 2020-08-08 LAB — CULTURE, BLOOD (ROUTINE X 2): Special Requests: ADEQUATE

## 2020-08-08 MED ORDER — OXYCODONE HCL 5 MG PO TABS: 5.0000 mg | ORAL_TABLET | Freq: Four times a day (QID) | ORAL | Status: DC | PRN

## 2020-08-08 MED ORDER — ONDANSETRON HCL 4 MG/2ML IJ SOLN
4.0000 mg | Freq: Four times a day (QID) | INTRAMUSCULAR | Status: DC | PRN
  Administered 2020-08-08: 4 mg via INTRAVENOUS
  Filled 2020-08-08: qty 2

## 2020-08-08 MED ORDER — QUETIAPINE FUMARATE 25 MG PO TABS
25.0000 mg | ORAL_TABLET | Freq: Every evening | ORAL | Status: DC | PRN
  Administered 2020-08-08: 25 mg via ORAL
  Filled 2020-08-08: qty 1

## 2020-08-08 MED ORDER — CEFAZOLIN SODIUM-DEXTROSE 2-4 GM/100ML-% IV SOLN
2.0000 g | Freq: Three times a day (TID) | INTRAVENOUS | Status: DC
  Administered 2020-08-08 – 2020-08-09 (×2): 2 g via INTRAVENOUS
  Filled 2020-08-08 (×2): qty 100

## 2020-08-08 NOTE — Progress Notes (Deleted)
error 

## 2020-08-08 NOTE — Significant Event (Signed)
Received call from the night cross-coverage physician that [name] was eloping and that they were busy attending to a code blue.   [name] was found outside in the cold with no shoes or socks talking to his RN and security. He is clearly confused, believes that he had been talking to me earlier though this is our first interaction, believes he was brought to the hospital yesterday though he has been here 11 days, and does not know where he is going. [name] is posing an immediate danger to his self and the safest course for the patient is to keep him hospitalized for now under IVC. IVC paperwork was completed with assistance of ED secretary who is faxing to [employer].

## 2020-08-08 NOTE — Progress Notes (Signed)
PMT consult received and chart reviewed. Discussed with Dr. Pietro Cassis. No family at bedside. VM left for wife, Gaige Sebo requesting return call for [place] discussion.   NO CHARGE  Ihor Dow, [place], FNP-C Palliative Medicine Team  Phone: [phone] Fax: [phone]

## 2020-08-08 NOTE — Progress Notes (Signed)
This Probation officer was on lunch break and notified by patient tech that the patient was missing from his room and had taken his belongings. Pt's room and 2w unit searched for pt. Security notified. On call physician paged. Pt's wife also notified Pt found outside a few steps away from the sidewalk. Pt refused to come back inside and after much coaxing by security agreed to come in to sign AMA  paperwork. Pt disoriented and insisting on going home, but is asking if his address is on the paperwork. Pt states he's going to walk home and was outside in hospital gown, his jacket and pants and no socks or shoes. Pt with an unsteady gait as well. Pt walked back inside on his own and assisted back in bed. Dr. Myna Hidalgo saw pt face to face and will submit IVC paperwork. Pt in bed with bed alarm on, medicated per [facility] and in NAD at this time.

## 2020-08-08 NOTE — Progress Notes (Signed)
IVC paperwork placed on patient's chart.

## 2020-08-08 NOTE — Progress Notes (Signed)
Physical Therapy Treatment Patient Details Name: Calvin [name] MRN: [id number] DOB: Feb 01, 1961 Today's Date: 08/08/2020    History of Present Illness 59 yo male with onset of acute renal failure and acute metabolic enceph was brought to hosp after being confused and found wandering outside.  Noted elevated ammonia and elevated creatinine.   PMHx:  hep C, EtOH abuse, cirrhosis, hyperammonia, stroke, TIA, HTN, chronic hip pain, spinal surgery, cervical spine surgery, total joint replacement    PT Comments    Pt with improving mobility and is only slightly unsteady without overt loss of balance with mobility. Pt may need supervision at dc for cognition but doubt needs SNF from PT standpoint. Will continue to follow for balance activities to minimize fall risk.    Follow Up Recommendations  Other (comment) (supervision for cognition)     Equipment Recommendations  None recommended by PT    Recommendations for Other Services       Precautions / Restrictions Precautions Precautions: Fall    Mobility  Bed Mobility Overal bed mobility: Modified Independent Bed Mobility: Supine to Sit;Sit to Supine     Supine to sit: Modified independent (Device/Increase time);HOB elevated Sit to supine: Modified independent (Device/Increase time);HOB elevated      Transfers Overall transfer level: Needs assistance Equipment used: None Transfers: Sit to/from Stand Sit to Stand: Supervision         General transfer comment: Assist for safety  Ambulation/Gait Ambulation/Gait assistance: Supervision;Min guard Gait Distance (Feet): 450 Feet Assistive device: None;4-wheeled walker Gait Pattern/deviations: Step-through pattern;Decreased stride length;Shuffle Gait velocity: decr Gait velocity interpretation: 1.31 - 2.62 ft/sec, indicative of limited community ambulator General Gait Details: Pt with slightly unsteady gait at times without overt loss of balance. Used rollator with slightly  improvement in stability but doubt pt will use.   Stairs             Wheelchair Mobility    Modified Rankin (Stroke Patients Only)       Balance Overall balance assessment: Needs assistance Sitting-balance support: Feet supported Sitting balance-Leahy Scale: Good     Standing balance support: During functional activity;No upper extremity supported Standing balance-Leahy Scale: Good               High level balance activites: Side stepping;Backward walking;Turns High Level Balance Comments: Needs min assist for single leg stance and [place]            Cognition Arousal/Alertness: Awake/alert Behavior During Therapy: Flat affect Overall Cognitive Status: Impaired/Different from baseline Area of Impairment: Memory;Safety/judgement                     Memory: Decreased short-term memory;Decreased recall of precautions   Safety/Judgement: Decreased awareness of safety;Decreased awareness of deficits            Exercises      General Comments        Pertinent Vitals/Pain Pain Assessment: No/denies pain    Home Living                      Prior Function            PT Goals (current goals can now be found in the care plan section) Acute Rehab PT Goals Patient Stated Goal: go home Progress towards PT goals: Goals met and updated - see care plan    Frequency    Min 3X/week      PT Plan Discharge plan needs to be updated  Co-evaluation              AM-PAC PT "6 Clicks" Mobility   Outcome Measure  Help needed turning from your back to your side while in a flat bed without using bedrails?: None Help needed moving from lying on your back to sitting on the side of a flat bed without using bedrails?: None Help needed moving to and from a bed to a chair (including a wheelchair)?: A Little Help needed standing up from a chair using your arms (e.g., wheelchair or bedside chair)?: None Help needed to walk in hospital  room?: A Little Help needed climbing 3-5 steps with a railing? : A Little 6 Click Score: 21    End of Session   Activity Tolerance: Patient tolerated treatment well Patient left: with call bell/phone within reach;with nursing/sitter in room;in bed;with bed alarm set Nurse Communication: Mobility status PT Visit Diagnosis: Unsteadiness on feet (R26.81)     Time: 0940-1000 PT Time Calculation (min) (ACUTE ONLY): 20 min  Charges:  $Gait Training: 8-22 mins                     [place] Pager [phone] Office [place] 08/08/2020, 11:41 AM

## 2020-08-08 NOTE — Progress Notes (Signed)
E.coli came back pan sens. D/w Dr Pietro Cassis, we will optimize the ceftriaxone to cefazolin to complete 7 days of total therapy.   Onnie Boer, PharmD, BCIDP, AAHIVP, CPP Infectious Disease Pharmacist 08/08/2020 9:55 AM

## 2020-08-08 NOTE — Consult Note (Signed)
[facility] Consult   Reason for Consult: Admitted for acute encephalopathy, IVC'd last night.  Referring Physician:  Dr. Pietro Cassis Patient Identification: Calvin [name] MRN:  [id number] Principal Diagnosis: Acute renal failure superimposed on stage 2 chronic kidney disease ([place]) Diagnosis:  Active Problems:   Essential hypertension   Alcohol abuse   Decompensated hepatic cirrhosis (HCC)   Chronic hepatitis C without hepatic coma (HCC)   Physical deconditioning   Total Time spent with patient: 30 minutes  Subjective:   Calvin [name] is a 59 y.o. male patient admitted with acute encephalopathy.  Patient is seen and assessed by this nurse practitioner. He is alert and oriented x 3 today. He is calm and cooperative, easily awaken and remains appropriate. In regards to yesterdays event patient states " I was not confused, I was upset. The nurse was talking to me in a disrespectful tone, so I left. I didn't know where I was going but the nurse and 3 police officers talked to me and convinced me to come back in. I aint no fool now it was cold out there. I aint no fool. " Patient denies any previous psychiatric history or diagnosis. He denies any previous psychiatric medications. He denies any previous or current depressive symptoms, mania, psychosis, or hallucinations. He denies any suicidal ideations, thoughts, or gestures. Patient did fall back asleep after the end of the evaluation, however he was easily awaken and continued to answer the remainder of questions appropriately. UDS is positive for opiates, in which he is prescribed oxycodone 10mg  po QID.   HPI:  Calvin [name] is a 59 y.o. male with known history of hepatitis C alcohol abuse and cirrhosis was found to be wandering on the road by the patient's family.  As per the family who reported to the EMS patient was confused last few days. In the ER patient was confused mildly agitated was given Ativan.  CT head is  unremarkable.  Labs are significant for acute renal failure and hyper ammonia.  Creatinine has increased from 1.3 in July 2021 and it is around 8.3.  AST is around 101 and ALT 49.  CK levels around 2700.  UA shows hyaline casts but no crystals.  Anion gap is around 20.  Alcohol levels are negative.  CBC shows mild leukocytosis with hemoglobin 11.4 which is around the baseline.  CT head was unremarkable CT renal study does not show any obstruction.  Patient was started on fluids admitted to acute renal failure and acute metabolic encephalopathy.  Covid test is negative.  By the time I examined the patient patient was only given Ativan so he was mildly sedated and was trying to resist my exam.    Past Psychiatric History: Denies, however chart review shows history of alcohol abuse. He otherwise denies any previous psych history. Noncontributory.   Risk to Self:  Denies Risk to Others:  Denies Prior Inpatient Therapy:   Denies Prior Outpatient Therapy:   Denies  Past Medical History:  Past Medical History:  Diagnosis Date  . Ascites   . Chronic left hip pain   . Cirrhosis ([place])   . Hypertension   . Stroke ([place])   . TIA (transient ischemic attack) 06/23/2016    Past Surgical History:  Procedure Laterality Date  . BACK SURGERY    . CERVICAL SPINE SURGERY    . JOINT REPLACEMENT     Family History:  Family History  Family history unknown: Yes   Family Psychiatric  History:  He reports substance abuse in his family. "My brother is a crack head and trying to become my payee."  Social History:  Social History   Substance and Sexual Activity  Alcohol Use Yes   Comment: 2 beers/day     Social History   Substance and Sexual Activity  Drug Use No    Social History   Socioeconomic History  . Marital status: Married    Spouse name: Not on file  . Number of children: Not on file  . Years of education: Not on file  . Highest education level: Not on file  Occupational History  . Not  on file  Tobacco Use  . Smoking status: Current Every Day Smoker    Types: Cigarettes  . Smokeless tobacco: Never Used  . Tobacco comment: 1/2 pack per day  Substance and Sexual Activity  . Alcohol use: Yes    Comment: 2 beers/day  . Drug use: No  . Sexual activity: Not on file  Other Topics Concern  . Not on file  Social History Narrative  . Not on file   Social Determinants of Health   Financial Resource Strain:   . Difficulty of Paying Living Expenses: Not on file  Food Insecurity:   . Worried About Charity fundraiser in the Last Year: Not on file  . Ran Out of Food in the Last Year: Not on file  Transportation Needs:   . Lack of Transportation (Medical): Not on file  . Lack of Transportation (Non-Medical): Not on file  Physical Activity:   . Days of Exercise per Week: Not on file  . Minutes of Exercise per Session: Not on file  Stress:   . Feeling of Stress : Not on file  Social Connections:   . Frequency of Communication with Friends and Family: Not on file  . Frequency of Social Gatherings with Friends and Family: Not on file  . Attends Religious Services: Not on file  . Active Member of Clubs or Organizations: Not on file  . Attends Archivist Meetings: Not on file  . Marital Status: Not on file   Additional Social History:    Allergies:  No Known Allergies  Labs:  Results for orders placed or performed during the hospital encounter of 07/27/20 (from the past 48 hour(s))  Ammonia     Status: Abnormal   Collection Time: 08/07/20  8:34 AM  Result Value Ref Range   Ammonia 54 (H) 9 - 35 umol/L    Comment: Performed at [facility], [address] [place], [place] [address]  CBC with Differential/Platelet     Status: Abnormal   Collection Time: 08/07/20  8:34 AM  Result Value Ref Range   WBC 12.6 (H) 4.0 - 10.5 K/uL   RBC 3.61 (L) 4.22 - 5.81 MIL/uL   Hemoglobin 11.5 (L) 13.0 - 17.0 g/dL   HCT 34.1 (L) 39 - 52 %   MCV 94.5 80.0 - 100.0 fL    MCH 31.9 26.0 - 34.0 pg   MCHC 33.7 30.0 - 36.0 g/dL   RDW 14.7 11.5 - 15.5 %   Platelets 188 150 - 400 K/uL   nRBC 0.0 0.0 - 0.2 %   Neutrophils Relative % 63 %   Neutro Abs 7.9 (H) 1.7 - 7.7 K/uL   Lymphocytes Relative 24 %   Lymphs Abs 3.0 0.7 - 4.0 K/uL   Monocytes Relative 11 %   Monocytes Absolute 1.4 (H) 0.1 - 1.0 K/uL  Eosinophils Relative 0 %   Eosinophils Absolute 0.0 0.0 - 0.5 K/uL   Basophils Relative 1 %   Basophils Absolute 0.1 0.0 - 0.1 K/uL   Immature Granulocytes 1 %   Abs Immature Granulocytes 0.10 (H) 0.00 - 0.07 K/uL    Comment: Performed at [facility] [address] [place], [place] [address]  Comprehensive metabolic panel     Status: Abnormal   Collection Time: 08/07/20  8:34 AM  Result Value Ref Range   Sodium 133 (L) 135 - 145 mmol/L   Potassium 4.3 3.5 - 5.1 mmol/L   Chloride 109 98 - 111 mmol/L   CO2 15 (L) 22 - 32 mmol/L   Glucose, Bld 105 (H) 70 - 99 mg/dL    Comment: Glucose reference range applies only to samples taken after fasting for at least 8 hours.   BUN 22 (H) 6 - 20 mg/dL   Creatinine, Ser 1.47 (H) 0.61 - 1.24 mg/dL   Calcium 10.5 (H) 8.9 - 10.3 mg/dL   Total Protein 8.0 6.5 - 8.1 g/dL   Albumin 3.0 (L) 3.5 - 5.0 g/dL   AST 35 15 - 41 U/L   ALT 29 0 - 44 U/L   Alkaline Phosphatase 96 38 - 126 U/L   Total Bilirubin 0.9 0.3 - 1.2 mg/dL   GFR, Estimated 54 (L) >60 mL/min    Comment: (NOTE) Calculated using the CKD-EPI Creatinine Equation (2021)    Anion gap 9 5 - 15    Comment: Performed at [facility], [place] [address] [place], [place] [address]  Lactic acid, plasma     Status: None   Collection Time: 08/07/20  8:34 AM  Result Value Ref Range   Lactic Acid, Venous 1.2 0.5 - 1.9 mmol/L    Comment: Performed at [facility] [address], [place], [place] [address]  Magnesium     Status: None   Collection Time: 08/07/20  8:34 AM  Result Value Ref Range   Magnesium 1.7 1.7 - 2.4 mg/dL    Comment: Performed  at [facility] [address] [place], [place] [address]  Phosphorus     Status: None   Collection Time: 08/07/20  8:34 AM  Result Value Ref Range   Phosphorus 2.6 2.5 - 4.6 mg/dL    Comment: Performed at [facility] [address], [place],  [address]    Current Facility-Administered Medications  Medication Dose Route Frequency Provider Last Rate Last Admin  . acetaminophen (TYLENOL) tablet 650 mg  650 mg Oral Q6H PRN Shela Leff, MD   650 mg at 08/05/20 2322  . ceFAZolin (ANCEF) IVPB 2g/100 mL premix  2 g Intravenous Q8H Pham, Minh Q, RPH-CPP      . feeding supplement (ENSURE ENLIVE / ENSURE PLUS) liquid 237 mL  237 mL Oral TID BM Dahal, Binaya, MD   237 mL at 02/54/27 0623  . folic acid (FOLVITE) tablet 1 mg  1 mg Oral Daily Shelly Coss, MD   1 mg at 08/07/20 7628  . furosemide (LASIX) tablet 20 mg  20 mg Oral Daily Dahal, Binaya, MD   20 mg at 08/07/20 1532  . haloperidol lactate (HALDOL) injection 2 mg  2 mg Intravenous Q6H PRN Dwyane Dee, MD   2 mg at 08/08/20 0342  . heparin injection 5,000 Units  5,000 Units Subcutaneous Q8H Rise Patience, MD   5,000 Units at 08/08/20 0603  . influenza vac  split quadrivalent PF (FLUARIX) injection 0.5 mL  0.5 mL Intramuscular Prior to discharge Shelly Coss, MD      . lactulose (CHRONULAC) 10 GM/15ML solution 20 g  20 g Oral TID Shelly Coss, MD   20 g at 08/07/20 2125  . MEDLINE mouth rinse  15 mL Mouth Rinse BID Shelly Coss, MD   15 mL at 08/07/20 2126  . multivitamin with minerals tablet 1 tablet  1 tablet Oral Daily Shelly Coss, MD   1 tablet at 08/07/20 5456  . pneumococcal 23 valent vaccine (PNEUMOVAX-23) injection 0.5 mL  0.5 mL Intramuscular Prior to discharge Shelly Coss, MD      . propranolol (INDERAL) tablet 10 mg  10 mg Oral BID Shelly Coss, MD   10 mg at 08/07/20 2125  . thiamine tablet 100 mg  100 mg Oral Daily Dwyane Dee, MD   100 mg at 08/07/20 2563  . vitamin B-12  (CYANOCOBALAMIN) tablet 1,000 mcg  1,000 mcg Oral Daily Dwyane Dee, MD   1,000 mcg at 08/07/20 8937    Musculoskeletal: Strength & Muscle Tone: within normal limits Gait & Station: normal Patient leans: N/A  Psychiatric Specialty Exam: Physical Exam  Review of Systems  Blood pressure 131/76, pulse 99, temperature 97.8 F (36.6 C), temperature source Oral, resp. rate 20, height 5\' 7"  (1.702 m), weight 69.9 kg, SpO2 100 %.Body mass index is 24.14 kg/m.  General Appearance: Fairly Groomed  Eye Contact:  Fair  Speech:  Clear and Coherent and Normal Rate  Volume:  Normal  Mood:  Euthymic  Affect:  Appropriate and Congruent  Thought Process:  Coherent, Linear and Descriptions of Associations: Intact  Orientation:  Full (Time, Place, and Person)  Thought Content:  Logical  Suicidal Thoughts:  No  Homicidal Thoughts:  No  Memory:  Immediate;   Fair Recent;   Fair Remote;   Poor  Judgement:  Fair  Insight:  Shallow  Psychomotor Activity:  Normal  Concentration:  Concentration: Fair and Attention Span: Fair  Recall:  [employer] of Knowledge:  Fair  Language:  Fair  Akathisia:  No  Handed:  Right  AIMS (if indicated):     Assets:  Communication Skills Desire for Improvement Financial Resources/Insurance Housing Intimacy Leisure Time Resilience Social Support  ADL's:  WNL  Cognition:  WNL  Sleep:      Patient was brought into ED after family members reported confusion and found him walking. He denies a history of psychiatric diagnosis, although he does have noted alcohol abuse and hepatitis.  Today he appears to be more alert and coherent, denies any periods of confusion, altered mental status, or psychosis.  Patient denies any depression, active suicidal thoughts with access and means. Patient was placed under IVC after attempting to elope, and exhibiting some confusion during the night. At this time this appears to have resolved and appears much more stable. Patient is at  risk for some delirium, albeit it is day 11 of his hospital stay. He does he meet inpatient criteria at this time.   Patient had no physical complaints.  Patient would like assistance with social work for his wife to become his payee/poa.    Treatment Plan Summary: Plan Continue safety sitter and close observation for another 24 hours. Patient at risk for developing delirium will recommend initating delirium precuations at this time. Will start prn Seroquel 25mg  po qhs for confusion, psychosis, and agitation. I  -Patient also has prescribed oxycodone 10mg  po QID by Ailene Ravel  [place] at [address] per [place]. Consider resuming this medication. He has been taking this medication daily for the past two years.   -will obtain ekg, last obtained 11/05 qtc 487. At this time he was dehydrated will assess for improvement.   Disposition: No evidence of imminent risk to self or others at present.   Patient does not meet criteria for psychiatric inpatient admission. Supportive therapy provided about ongoing stressors. He is encouraged to verbalize his complaints instead of getting upset and attempting to leave. He verbalize understanding and request his wife be with him. Per chart review it has been hard to reach family members.   Suella Broad, FNP 08/08/2020 1:23 PM

## 2020-08-08 NOTE — Progress Notes (Signed)
PROGRESS NOTE  Calvin [name]  DOB: [birth year]  PCP: Jenny Reichmann, PA-C ERX:[id number]  DOA: 07/27/2020  LOS: 12 days   Chief Complaint  Patient presents with  . Altered Mental Status    Brief narrative: [name] is a 59 year old male with history of hepatitis C, chronic alcohol abuse, liver cirrhosis who was found to be wandering around the road by his family and was brought to the emergency department. As per the family, he was found to be confused for a few days.  On presentation, he was not alert or oriented.  CT head, MRI of the brain did not any acute intracranial abnormalities.  Labs showed severely elevated ammonia level, severe AKI with creatinine in the range of 8. CK level was elevated.   Patient was admitted for the management of hepatic encephalopathy along with severe AKI.  Hospital course was remarkable for persistent encephalopathy. PT/OT recommended skilled nursing facility. He was also started on high dose thiamine for possible B1 deficiency  His mentation slowly improved throughout remainder of hospitalization.  Disposition was the biggest obstacle as initial facility offer was declined by family.  Subjective: Patient was seen and examined this morning.  Lying down in bed.  Wife at bedside.  Patient is alert, awake, oriented to place and person, gets the month and year right.  His mental status seems intact at this time.  Event from last night noted.  Patient was confused and was trying to walk out of the hospital unaccompanied.  Wife at bedside wants to take him home ultimately but does not feel he is controllable currently.   Assessment/Plan: Acute encephalopathy -Multifactorial due to metabolic encephalopathy, hepatic encephalopathy, uremic encephalopathy, UTI. -CT head unremarkable.  No report of seizures -mentation has been mostly improving however on 07/31/2020 he became more confused and agitated and was started on CIWA protocol - he is  rather impulsive and remains intermittently encephalopathic despite correction of electrolytes, ammonia and other derangements; acting almost as Warnicke's; he  was started on high-dose thiamine.  B1 level was sent but taking too long to turn around, sample may be lost -s/p high dose thiamine course. -continue on thiamine  -his mental status may be currently at her new baseline.  However, last night, patient was altered, disoriented, restless and tried to leave AMA without being unable to get his address. -Patient has placed into IVC.  Psychiatry consultation called.  AKI on CKD 2 metabolic acidosis -Baseline creatinine between 1-2.8 in the last year..  -Presented with creatinine in the range of 8. This is most likely from acute tubular necrosis from rhabdomyolysis. Urine sodium was 40.  - CT renal study did not show any hydronephrosis or obstructive pathology but showed punctuate nonobstructing stone in the lower right kidney. Patient also had a metabolic acidosis.He was onbicarb drip. Investigations negative screening for Methyl alcohol, ethyleneglycol, acetone, isopropyl alcohol. - Continued improvement in the kidney function with IV fluids.  Off fluids now. The kidney function is at baseline now. Recent Labs    07/27/20 1956 07/27/20 1956 07/27/20 2036 07/28/20 0033 07/28/20 0214 07/28/20 0731 07/29/20 0931 07/30/20 0208 07/31/20 0451 08/02/20 0149 08/03/20 0143 08/07/20 0834  BUN 73*  --  65*  --  69* 66* 44* 28* 20 26* 26* 22*  CREATININE 8.38*   < > 8.70* 7.27* 6.51* 5.35* 2.03* 1.49* 1.47* 1.59* 1.56* 1.47*   < > = values in this interval not displayed.   UTI E. coli bacteremia -on chart review I noted  that patient had a temperature 100.4 on 11/14.  He had a urinalysis and blood culture sent.  Urinalysis was positive for nitrite.  Blood culture and urine culture grew E.coli.  -Continue Rocephin based on sensitivity pattern.  Physical deconditioning -Evaluated by  PT, with recommendations for SNF  Chronic hepatitis C without hepatic coma - not a good transplant or treatment candidate until abstinent from alchol  Decompensated hepatic cirrhosis Portal hypertension Essential hypertension - liver cirrhosis secondary to alcohol abuse and hepatitis C. Mild elevated AST and ALT but this could be from elevated CK. -Home meds include propanolol, Lasix, spironolactone.   -Continue propanolol and Lasix.  Blood pressure stable.   -Ultrasound-guided paracentesis was tried on 11/16 but patient did not have enough fluid.  Alcohol abuse -continue thiamine, folic acid, multivitamin  Mobility: PT eval obtained. Code Status:   Code Status: Full Code  Nutritional status: Body mass index is 24.14 kg/m. Nutrition Problem: Inadequate oral intake Etiology: poor appetite Signs/Symptoms: meal completion < 25% Diet Order            Diet regular Room service appropriate? Yes; Fluid consistency: Thin  Diet effective now                DVT prophylaxis: heparin injection 5,000 Units Start: 07/28/20 1400   Antimicrobials:  Completed a course of IV Rocephin. Fluid: Not on IV fluid Consultants: None Family Communication:  None at bedside  Status is: Inpatient  Remains inpatient appropriate because: Unsafe discharge plan, also new blood culture and urine culture positivity   Dispo: The patient is from: Home              Anticipated d/c is to: Unclear at this time              Anticipated d/c date is: Unclear at this time              Patient currently is not medically stable to d/c.  Apparently, patient is unable to make his decision because of encephalopathy.  His wife is not reachable.  His brother is involved but patient does not want his brother to make the decision about his disposition.  Case management aware.  I will involve palliative care consultation as well.  Infusions:  .  ceFAZolin (ANCEF) IV      Scheduled Meds: . feeding supplement   237 mL Oral TID BM  . folic acid  1 mg Oral Daily  . furosemide  20 mg Oral Daily  . heparin  5,000 Units Subcutaneous Q8H  . lactulose  20 g Oral TID  . mouth rinse  15 mL Mouth Rinse BID  . multivitamin with minerals  1 tablet Oral Daily  . propranolol  10 mg Oral BID  . thiamine  100 mg Oral Daily  . vitamin B-12  1,000 mcg Oral Daily    Antimicrobials: Anti-infectives (From admission, onward)   Start     Dose/Rate Route Frequency Ordered Stop   08/08/20 1400  ceFAZolin (ANCEF) IVPB 2g/100 mL premix        2 g 200 mL/hr over 30 Minutes Intravenous Every 8 hours 08/08/20 0954 08/13/20 0559   08/06/20 1730  cefTRIAXone (ROCEPHIN) 2 g in sodium chloride 0.9 % 100 mL IVPB  Status:  Discontinued        2 g 200 mL/hr over 30 Minutes Intravenous Every 24 hours 08/06/20 1630 08/08/20 0954   08/06/20 1200  vancomycin (VANCOREADY) IVPB 500 mg/100 mL  Status:  Discontinued  500 mg 100 mL/hr over 60 Minutes Intravenous Every 12 hours 08/05/20 2319 08/06/20 0705   08/06/20 0800  piperacillin-tazobactam (ZOSYN) IVPB 3.375 g  Status:  Discontinued        3.375 g 12.5 mL/hr over 240 Minutes Intravenous Every 8 hours 08/05/20 2319 08/06/20 0705   08/06/20 0000  piperacillin-tazobactam (ZOSYN) IVPB 3.375 g        3.375 g 100 mL/hr over 30 Minutes Intravenous  Once 08/05/20 2310 08/06/20 0700   08/06/20 0000  vancomycin (VANCOREADY) IVPB 1500 mg/300 mL        1,500 mg 150 mL/hr over 120 Minutes Intravenous  Once 08/05/20 2311 08/06/20 0700      PRN meds: acetaminophen, haloperidol lactate, influenza vac split quadrivalent PF, pneumococcal 23 valent vaccine   Objective: Vitals:   08/07/20 2023 08/08/20 0300  BP: 128/75 131/76  Pulse:  99  Resp: 20 20  Temp: 98 F (36.7 C) 97.8 F (36.6 C)  SpO2:  100%   No intake or output data in the 24 hours ending 08/08/20 1321 Filed Weights   08/01/20 0105 08/02/20 0500 08/03/20 0414  Weight: 68.1 kg 67.6 kg 69.9 kg   Weight change:    Body mass index is 24.14 kg/m.   Physical Exam: General exam: Appears calm and comfortable.  Not in distress Skin: No rashes, lesions or ulcers. HEENT: Atraumatic, normocephalic, supple neck, no obvious bleeding Lungs: Clear to auscultation bilaterally CVS: Regular rate and rhythm, no murmur GI/Abd soft, distended slightly, bowel sound present. CNS: Alert, awake, oriented to place. Psychiatry: Mood appropriate Extremities: No pedal edema, no calf tenderness  Data Review: I have personally reviewed the laboratory data and studies available.  Recent Labs  Lab 08/02/20 0149 08/03/20 0143 08/06/20 0106 08/07/20 0834  WBC 13.9* 11.6* 12.3*  12.2* 12.6*  NEUTROABS 7.9* 5.1 7.2 7.9*  HGB 12.2* 11.8* 11.6*  11.5* 11.5*  HCT 37.4* 36.1* 34.8*  34.2* 34.1*  MCV 97.7 98.4 97.2  95.8 94.5  PLT 216 230 209  206 188   Recent Labs  Lab 08/02/20 0149 08/03/20 0143 08/07/20 0834  NA 145 140 133*  K 3.7 3.3* 4.3  CL 116* 111 109  CO2 18* 19* 15*  GLUCOSE 95 143* 105*  BUN 26* 26* 22*  CREATININE 1.59* 1.56* 1.47*  CALCIUM 10.6* 10.3 10.5*  MG 1.9 2.0 1.7  PHOS  --   --  2.6    F/u labs ordered.  Signed, Terrilee Croak, MD [facility] 08/08/2020

## 2020-08-09 DIAGNOSIS — Z66 Do not resuscitate: Secondary | ICD-10-CM | POA: Diagnosis not present

## 2020-08-09 DIAGNOSIS — N170 Acute kidney failure with tubular necrosis: Principal | ICD-10-CM

## 2020-08-09 DIAGNOSIS — N179 Acute kidney failure, unspecified: Secondary | ICD-10-CM | POA: Diagnosis not present

## 2020-08-09 DIAGNOSIS — Z515 Encounter for palliative care: Secondary | ICD-10-CM

## 2020-08-09 DIAGNOSIS — N182 Chronic kidney disease, stage 2 (mild): Secondary | ICD-10-CM | POA: Diagnosis not present

## 2020-08-09 LAB — CBC WITH DIFFERENTIAL/PLATELET
Abs Immature Granulocytes: 0.04 10*3/uL (ref 0.00–0.07)
Basophils Absolute: 0.1 10*3/uL (ref 0.0–0.1)
Basophils Relative: 1 %
Eosinophils Absolute: 0.1 10*3/uL (ref 0.0–0.5)
Eosinophils Relative: 1 %
HCT: 30.7 % — ABNORMAL LOW (ref 39.0–52.0)
Hemoglobin: 10.5 g/dL — ABNORMAL LOW (ref 13.0–17.0)
Immature Granulocytes: 1 %
Lymphocytes Relative: 40 %
Lymphs Abs: 3.6 10*3/uL (ref 0.7–4.0)
MCH: 32.5 pg (ref 26.0–34.0)
MCHC: 34.2 g/dL (ref 30.0–36.0)
MCV: 95 fL (ref 80.0–100.0)
Monocytes Absolute: 1.7 10*3/uL — ABNORMAL HIGH (ref 0.1–1.0)
Monocytes Relative: 19 %
Neutro Abs: 3.4 10*3/uL (ref 1.7–7.7)
Neutrophils Relative %: 38 %
Platelets: 179 10*3/uL (ref 150–400)
RBC: 3.23 MIL/uL — ABNORMAL LOW (ref 4.22–5.81)
RDW: 14.7 % (ref 11.5–15.5)
WBC: 8.9 10*3/uL (ref 4.0–10.5)
nRBC: 0 % (ref 0.0–0.2)

## 2020-08-09 LAB — BASIC METABOLIC PANEL
Anion gap: 9 (ref 5–15)
BUN: 22 mg/dL — ABNORMAL HIGH (ref 6–20)
CO2: 17 mmol/L — ABNORMAL LOW (ref 22–32)
Calcium: 10.1 mg/dL (ref 8.9–10.3)
Chloride: 109 mmol/L (ref 98–111)
Creatinine, Ser: 1.32 mg/dL — ABNORMAL HIGH (ref 0.61–1.24)
GFR, Estimated: >60 mL/min (ref >60)
Glucose, Bld: 102 mg/dL — ABNORMAL HIGH (ref 70–99)
Potassium: 3.9 mmol/L (ref 3.5–5.1)
Sodium: 135 mmol/L (ref 135–145)

## 2020-08-09 LAB — MAGNESIUM: Magnesium: 2 mg/dL (ref 1.7–2.4)

## 2020-08-09 MED ORDER — ADULT MULTIVITAMIN W/MINERALS CH: 1.0000 | ORAL_TABLET | Freq: Every day | ORAL | 0 refills | Status: AC

## 2020-08-09 MED ORDER — QUETIAPINE FUMARATE 25 MG PO TABS
25.0000 mg | ORAL_TABLET | Freq: Every evening | ORAL | 0 refills | Status: DC | PRN
Start: 2020-08-09 — End: 2022-09-03

## 2020-08-09 MED ORDER — CYANOCOBALAMIN 1000 MCG PO TABS: 1000.0000 ug | ORAL_TABLET | Freq: Every day | ORAL | 0 refills | Status: AC

## 2020-08-09 MED ORDER — CEFDINIR 300 MG PO CAPS: 300.0000 mg | ORAL_CAPSULE | Freq: Two times a day (BID) | ORAL | 0 refills | Status: AC

## 2020-08-09 NOTE — Consult Note (Signed)
Consultation Note Date: 08/09/2020   Patient Name: Calvin [name]  DOB: 10/29/1960  MRN: [id number]  Age / Sex: 59 y.o., male  PCP: Jenny Reichmann, PA-C (Inactive) Referring Physician: Terrilee Croak, MD  Reason for Consultation: Establishing goals of care  HPI/Patient Profile: 59 y.o. male  with past medical history of ETOH Cirrhosis, right eye blindness, Hep C (status post treatment), CVA, chronic left hip pain who was admitted on 07/27/2020 with acute encephalopathy due to acute on chronic renal failure and rhabdomyolysis combined with likely decompensated cirrhosis (evidenced by mildly elevated transaminases and ammonia level).  We were asked to see for goals of care.  Clinical Assessment and Goals of Care:  I have reviewed medical records including EPIC notes, labs and imaging, received report from the care team, examined the patient and talked with him  to discuss diagnosis prognosis, GOC, EOL wishes, disposition and options.  I introduced Palliative Medicine as specialized medical care for people living with serious illness. It focuses on providing relief from the symptoms and stress of a serious illness.   We discussed a brief life review of the patient. He is from [place] in [facility]  He has his own apartment and has been happily married to his wife for over 65 years.  He has 3 daughters.    As far as functional and nutritional status he is blind in one eye and has difficulty walking secondary to left hip pain.  He explains that his wife is worried about him living in his apartment because there are steep stairs there.  She asked him to come live with her.    We talked about surrogate decision makers.  I asked if you are unable to speak with [place], who would you want to make your medical decisions for you?  He immediately responded "my wife".  Then he added that he would also like for his  eldest daughter to be involved in the decision making process.  He tells me how close he is to his eldest daughter and explains that she had a leg amputated and has 4 children of her own.  I asked him about heroic measures.  If you became so sick that your heart stopped and you stopped breathing and you died what would you want [place] to do?  He shook his head and said that he would not want to be on life support he would want to be taken to [facility] and buried.  Mr. Mangieri tells me that he and his wife have talked about not wanting to be on life support.  We talked for a bit about his plans.  He is anxious for discharge.  I explained that the doctors are trying to ensure a safe discharge.  Mr. Branscome wants to be discharged as soon as possible.  Questions and concerns were addressed.  The family was encouraged to call with questions or concerns.    Primary Decision Maker:  PATIENT    SUMMARY OF RECOMMENDATIONS  Wife and eldest daughter are surrogate decision makers Code status changed to DNR. Recommend out patient palliative to follow.  Code Status/Advance Care Planning:  DNR   Prognosis:  Unable to determine.    Discharge Planning: To Be Determined      Primary Diagnoses: Present on Admission: . Essential hypertension . Chronic hepatitis C without hepatic coma ([place]) . Decompensated hepatic cirrhosis ([place]) . (Resolved) Acute renal failure superimposed on stage 2 chronic kidney disease ([place]) . Alcohol abuse   I have reviewed the medical record, interviewed the patient and family, and examined the patient. The following aspects are pertinent.  Past Medical History:  Diagnosis Date  . Ascites   . Chronic left hip pain   . Cirrhosis ([place])   . Hypertension   . Stroke ([place])   . TIA (transient ischemic attack) 06/23/2016   Social History   Socioeconomic History  . Marital status: Married    Spouse name: Not on file  . Number of children: Not  on file  . Years of education: Not on file  . Highest education level: Not on file  Occupational History  . Not on file  Tobacco Use  . Smoking status: Current Every Day Smoker    Types: Cigarettes  . Smokeless tobacco: Never Used  . Tobacco comment: 1/2 pack per day  Substance and Sexual Activity  . Alcohol use: Yes    Comment: 2 beers/day  . Drug use: No  . Sexual activity: Not on file  Other Topics Concern  . Not on file  Social History Narrative  . Not on file   Social Determinants of Health   Financial Resource Strain:   . Difficulty of Paying Living Expenses: Not on file  Food Insecurity:   . Worried About Charity fundraiser in the Last Year: Not on file  . Ran Out of Food in the Last Year: Not on file  Transportation Needs:   . Lack of Transportation (Medical): Not on file  . Lack of Transportation (Non-Medical): Not on file  Physical Activity:   . Days of Exercise per Week: Not on file  . Minutes of Exercise per Session: Not on file  Stress:   . Feeling of Stress : Not on file  Social Connections:   . Frequency of Communication with Friends and Family: Not on file  . Frequency of Social Gatherings with Friends and Family: Not on file  . Attends Religious Services: Not on file  . Active Member of Clubs or Organizations: Not on file  . Attends Archivist Meetings: Not on file  . Marital Status: Not on file   Family History  Family history unknown: Yes    No Known Allergies   Vital Signs: BP 109/74   Pulse 93   Temp 98.7 F (37.1 C) (Oral)   Resp 18   Ht 5\' 7"  (1.702 m)   Wt 69.9 kg   SpO2 99%   BMI 24.14 kg/m  Pain Scale: 0-10 POSS *See Group Information*: S-Acceptable,Sleep, easy to arouse Pain Score: 0-No pain   SpO2: SpO2: 99 % O2 Device:SpO2: 99 % O2 Flow Rate: .O2 Flow Rate (L/min): 2 L/min    Palliative Assessment/Data: 60%     Time In: 10:45 Time Out: 11:22 Time Total: 30 min. Visit consisted of counseling and  education dealing with the complex and emotionally intense issues surrounding the need for palliative care and symptom management in the setting of serious and potentially life-threatening illness. Greater than 50%  of this time was spent counseling and coordinating care related to the above assessment and plan.  Signed by: Florentina Jenny, PA-C Palliative Medicine  Please contact Palliative Medicine Team phone at [phone] for questions and concerns.  For individual provider: See Shea Evans

## 2020-08-09 NOTE — Plan of Care (Signed)

## 2020-08-09 NOTE — TOC Transition Note (Signed)
Transition of Care [facility]) - CM/SW Discharge Note   Patient Details  Name: Calvin [name] MRN: [id number] Date of Birth: [birth year]  [facility] CM/SW Contact:  Angelita Ingles, RN Phone Number: [phone]  08/09/2020, 2:10 PM   Clinical Narrative:    CM consulted for patient discharging home. Daughter Calvin [name] at bedside and states that she and her mother Calvin [name] will be responsible for the care of the patient. Daughter states that the patient will stay with her or her mother but will not be at home alone. Patient states that he is ok to go home with his daughter and is agreeable to allowing daughter and wife make decisions for his care. MD and bedside nurse have been updated. No further needs noted. CM will sign off.    Final next level of care: Home/Self Care Barriers to Discharge: No Barriers Identified   Patient Goals and CMS Choice Patient states their goals for this hospitalization and ongoing recovery are:: Patient states that he is ready to go. CMS Medicare.gov Compare Post Acute Care list provided to:: Patient Represenative (must comment) Choice offered to / list presented to : NA  Discharge Placement                       Discharge Plan and Services In-house Referral: NA Discharge Planning Services: CM Consult Post Acute Care Choice: [place]          DME Arranged: N/A DME Agency: NA       HH Arranged: NA HH Agency: NA        Social Determinants of Health (SDOH) Interventions     Readmission Risk Interventions Readmission Risk Prevention Plan 08/09/2020  Transportation Screening Complete  Medication Review Press photographer) Complete  PCP or Specialist appointment within 3-5 days of discharge Complete  HRI or [place] Complete  SW Recovery Care/Counseling Consult Complete  [place] Not Applicable  Some recent data might be hidden

## 2020-08-09 NOTE — Discharge Summary (Signed)
Physician Discharge Summary  STONE SPIRITO AYT:[id number] DOB: 08/25/1961 DOA: 07/27/2020  PCP: Jenny Reichmann, PA-C (Inactive)  Admit date: 07/27/2020 Discharge date: 08/09/2020  Admitted From: Home Discharge disposition: Home   Code Status: Full Code  Diet Recommendation: Low-salt diet.  Stop alcohol.  Discharge Diagnosis:   Principal Problem:   Metabolic encephalopathy Active Problems:   Essential hypertension   Alcohol abuse   Decompensated hepatic cirrhosis (HCC)   Chronic hepatitis C without hepatic coma (HCC)   Physical deconditioning   DNR (do not resuscitate)   Palliative care encounter   History of Present Illness / Brief narrative:  [name] is a 59 year old male with history of hepatitis C, chronic alcohol abuse, liver cirrhosis who was found to be wandering around the road by his family and was brought to the emergency department. As per the family, he was found to be confused for a few days.  On presentation, he was not alert or oriented.  CT head, MRI of the brain did not any acute intracranial abnormalities.  Labs showed severely elevated ammonia level, severe AKI with creatinine in the range of 8. CK level was elevated.   Patient was admitted for the management of hepatic encephalopathy along with severe AKI.  Hospital course was remarkable for persistent encephalopathy. PT/OT recommended skilled nursing facility. He was also started on high dose thiamine for possible B1 deficiency  His mentation slowly improved throughout remainder of hospitalization.   Subjective:  Seen and examined this morning.  Middle-aged African-American male.  Lying on bed.  Not in physical distress.  Oriented x3.  Wants to go home today.  Hospital Course:  Acute encephalopathy -Multifactorial due to metabolic encephalopathy, hepatic encephalopathy, uremic encephalopathy, UTI. -CT head unremarkable. No report of seizures -His mental status has gradually  improved.  He had had episodes of agitation, impulsiveness.  Psychiatry consultation was obtained.  He was started on Seroquel.  He was also given a course of high-dose thiamine. -On 11/15, he tried to leave AMA under unsafe situation.  He was placed on involuntary commitment.  Currently mental status is improved.  I will discontinue IVC and discharge him home on Seroquel today.  AKI on CKD 2 metabolic acidosis -Baseline creatinine between 1-2.8 in the last year..  -Presented with creatinine in the range of 8. This is most likely from acute tubular necrosis from rhabdomyolysis. Urine sodium was 40. -CT renal study did not show any hydronephrosis or obstructive pathology but showed punctuate nonobstructing stone in the lower right kidney. -Patient also had a metabolic acidosis and was treated with bicarbonate drip. -Investigations negative screening for Methyl alcohol, ethyleneglycol, acetone, isopropyl alcohol. -Renal function has significantly improved with IV fluid and is back to baseline. Recent Labs    07/27/20 2036 07/27/20 2036 07/28/20 0033 07/28/20 0214 07/28/20 0731 07/29/20 0931 07/30/20 0208 07/31/20 0451 08/02/20 0149 08/03/20 0143 08/07/20 0834 08/09/20 0127  BUN 65*  --   --  69* 66* 44* 28* 20 26* 26* 22* 22*  CREATININE 8.70*   < > 7.27* 6.51* 5.35* 2.03* 1.49* 1.47* 1.59* 1.56* 1.47* 1.32*   < > = values in this interval not displayed.   UTI E. coli bacteremia -on chart review I noted that patient had a temperature 100.4 on 11/14.  He had a urinalysis and blood culture sent.  Urinalysis was positive for nitrite.  Blood culture and urine culture grew E.coli.  -Currently on IV Ancef based on sensitivity pattern. -I will discharge him on oral  Omnicef for next 7 days with probiotics.  Chronic hepatitis C without hepatic coma - not a good transplant or treatment candidate until abstinent from alcohol  Decompensated hepatic cirrhosis Portal  hypertension Essential hypertension -liver cirrhosis secondary to alcohol abuse and hepatitis C. Mild elevated AST and ALT but this could be from elevated CK. -Currently blood pressure is stable on propanolol and Lasix.  Discharged on the same. -Ultrasound-guided paracentesis was tried on 11/16 but patient did not have enough fluid. -Continue lactulose to target 2-3 bowel movements a day.  Alcohol abuse -Counseled to quit.  Continue thiamine, folic acid, multivitamin  Stable for discharge to home today.  Wound care:    Discharge Exam:   Vitals:   08/08/20 0300 08/08/20 1333 08/08/20 2036 08/09/20 1022  BP: 131/76 107/73 119/85 109/74  Pulse: 99 93 (!) 104 93  Resp: 20 18    Temp: 97.8 F (36.6 C) 99.1 F (37.3 C) 98.7 F (37.1 C)   TempSrc: Oral  Oral   SpO2: 100% 100% 99%   Weight:      Height:        Body mass index is 24.14 kg/m.  General exam: Not in physical distress Skin: No rashes, lesions or ulcers. HEENT: Atraumatic, normocephalic, no obvious bleeding Lungs: Clear to auscultation bilaterally CVS: Regular rate and rhythm, no murmur GI/Abd soft, nontender, nondistended, bowel sound present CNS: Alert, awake, oriented x3 Psychiatry: Mood appropriate Extremities: No pedal edema, no calf tenderness  Follow ups:   Discharge Instructions    Diet - low sodium heart healthy   Complete by: As directed    Discharge wound care:   Complete by: As directed    Apply single layer of xeroform around penis to cover wound. Top with ABD pad, secure with mesh underwear if needed. OK to leave just single layer of xeforom in place if it will stay without topper.   Increase activity slowly   Complete by: As directed       Follow-up Information    Jenny Reichmann, PA-C Follow up.   Specialty: Physician Assistant Contact information: [place] [id number] [facility].   Contact  information: [place] [phone]              Recommendations for Outpatient Follow-Up:   1. Follow-up with PCP as an outpatient 2. Follow-up with palliative care as an outpatient  Discharge Instructions:  Follow with Primary MD Jenny Reichmann, PA-C (Inactive) in 7 days   Get CBC/BMP checked in next visit within 1 week by PCP or SNF MD ( we routinely change or add medications that can affect your baseline labs and fluid status, therefore we recommend that you get the mentioned basic workup next visit with your PCP, your PCP may decide not to get them or add new tests based on their clinical decision)  On your next visit with your PCP, please Get Medicines reviewed and adjusted.  Please request your PCP  to go over all Hospital Tests and Procedure/Radiological results at the follow up, please get all Hospital records sent to your Prim MD by signing hospital release before you go home.  Activity: As tolerated with Full fall precautions use walker/cane & assistance as needed  For Heart failure patients - Check your Weight same time everyday, if you gain over 2 pounds, or you develop in leg swelling, experience more shortness of breath or  chest pain, call your Primary MD immediately. Follow Cardiac Low Salt Diet and 1.5 lit/day fluid restriction.  If you have smoked or chewed Tobacco in the last 2 yrs please stop smoking, stop any regular Alcohol  and or any Recreational drug use.  If you experience worsening of your admission symptoms, develop shortness of breath, life threatening emergency, suicidal or homicidal thoughts you must seek medical attention immediately by calling 911 or calling your MD immediately  if symptoms less severe.  You Must read complete instructions/literature along with all the possible adverse reactions/side effects for all the Medicines you take and that have been prescribed to you. Take any new Medicines after you have  completely understood and accpet all the possible adverse reactions/side effects.   Do not drive, operate heavy machinery, perform activities at heights, swimming or participation in water activities or provide baby sitting services if your were admitted for syncope or siezures until you have seen by Primary MD or a Neurologist and advised to do so again.  Do not drive when taking Pain medications.  Do not take more than prescribed Pain, Sleep and Anxiety Medications  Wear Seat belts while driving.   Please note You were cared for by a hospitalist during your hospital stay. If you have any questions about your discharge medications or the care you received while you were in the hospital after you are discharged, you can call the unit and asked to speak with the hospitalist on call if the hospitalist that took care of you is not available. Once you are discharged, your primary care physician will handle any further medical issues. Please note that NO REFILLS for any discharge medications will be authorized once you are discharged, as it is imperative that you return to your primary care physician (or establish a relationship with a primary care physician if you do not have one) for your aftercare needs so that they can reassess your need for medications and monitor your lab values.    Allergies as of 08/09/2020   No Known Allergies     Medication List    STOP taking these medications   amLODipine 5 MG tablet Commonly known as: NORVASC   cyclobenzaprine 10 MG tablet Commonly known as: FLEXERIL   Preparation H 0.25-88.44 % suppository Generic drug: shark liver oil-cocoa butter   spironolactone 100 MG tablet Commonly known as: ALDACTONE   tiZANidine 4 MG tablet Commonly known as: ZANAFLEX     TAKE these medications   allopurinol 100 MG tablet Commonly known as: ZYLOPRIM Take 100 mg by mouth daily.   cefdinir 300 MG capsule Commonly known as: OMNICEF Take 1 capsule (300 mg  total) by mouth 2 (two) times daily for 7 days.   cyanocobalamin 1000 MCG tablet Take 1 tablet (1,000 mcg total) by mouth daily. Start taking on: August 10, 8545   folic acid 1 MG tablet Commonly known as: FOLVITE Take 1 mg by mouth daily.   furosemide 20 MG tablet Commonly known as: LASIX Take 20 mg by mouth daily.   gabapentin 100 MG capsule Commonly known as: NEURONTIN Take 100-300 mg by mouth See admin instructions. 100 mg in the morning, 300 mg at bedtime   lactulose 10 GM/15ML solution Commonly known as: CHRONULAC Take 30 mLs (20 g total) by mouth 2 (two) times daily.   multivitamin with minerals Tabs tablet Take 1 tablet by mouth daily. Start taking on: August 10, 2020   nicotine 21 mg/24hr patch Commonly known as: NICODERM CQ -  dosed in mg/24 hours Place 1 patch (21 mg total) onto the skin daily.   Oxycodone HCl 10 MG Tabs Take 10 mg by mouth 4 (four) times daily as needed (pain).   pantoprazole 40 MG tablet Commonly known as: PROTONIX Take 40 mg by mouth daily.   propranolol 10 MG tablet Commonly known as: INDERAL Take 1 tablet (10 mg total) by mouth 2 (two) times daily.   QUEtiapine 25 MG tablet Commonly known as: SEROQUEL Take 1 tablet (25 mg total) by mouth at bedtime as needed (psychosis, confusion, agitaiton).   thiamine 100 MG tablet Take 1 tablet (100 mg total) by mouth daily.   Vitamin D (Ergocalciferol) 1.25 MG (50000 UNIT) Caps capsule Commonly known as: DRISDOL Take 50,000 Units by mouth every Friday.   Vitamin D3 50 MCG (2000 UT) capsule Take 2,000 Units by mouth daily.            Discharge Care Instructions  (From admission, onward)         Start     Ordered   08/09/20 0000  Discharge wound care:       Comments: Apply single layer of xeroform around penis to cover wound. Top with ABD pad, secure with mesh underwear if needed. OK to leave just single layer of xeforom in place if it will stay without topper.   08/09/20 1150           Time coordinating discharge: 35 minutes  The results of significant diagnostics from this hospitalization (including imaging, microbiology, ancillary and laboratory) are listed below for reference.    Procedures and Diagnostic Studies:   CT HEAD WO CONTRAST  Result Date: 07/27/2020 CLINICAL DATA:  Altered mental status. EXAM: CT HEAD WITHOUT CONTRAST TECHNIQUE: Contiguous axial images were obtained from the base of the skull through the vertex without intravenous contrast. COMPARISON:  Head CT 04/02/2020 FINDINGS: Brain: Generalized atrophy, stable from prior but advanced for age. No intracranial hemorrhage, mass effect, or midline shift. No hydrocephalus. The basilar cisterns are patent. No evidence of territorial infarct or acute ischemia. No extra-axial or intracranial fluid collection. Vascular: Atherosclerosis of skullbase vasculature without hyperdense vessel or abnormal calcification. Skull: No fracture or focal lesion. Sinuses/Orbits: Remote right orbital fracture. Chronic right globe calcification. Portions of the paranasal sinuses are obscured by motion artifact. No acute findings. Other: None. IMPRESSION: 1. No acute intracranial abnormality. 2. Generalized atrophy, stable but advanced for age. Electronically Signed   By: Keith Rake M.D.   On: 07/27/2020 20:49   MR BRAIN WO CONTRAST  Result Date: 07/28/2020 CLINICAL DATA:  Initial evaluation for acute altered mental status. EXAM: MRI HEAD WITHOUT CONTRAST TECHNIQUE: Multiplanar, multiecho pulse sequences of the brain and surrounding structures were obtained without intravenous contrast. COMPARISON:  Prior CT from 07/27/2020 as well as previous brain MRI from 06/23/2016. FINDINGS: Brain: Examination mildly degraded by motion artifact. Generalized age-related cerebral atrophy. Patchy T2/FLAIR hyperintensity within the periventricular deep white matter most consistent with chronic small vessel ischemic disease, mild in  nature. No abnormal foci of restricted diffusion to suggest acute or subacute ischemia. Gray-white matter differentiation maintained. No encephalomalacia to suggest chronic cortical infarction. No findings to suggest acute or chronic intracranial hemorrhage. No mass lesion, midline shift or mass effect. No hydrocephalus or extra-axial fluid collection. Pituitary gland suprasellar region normal. Midline structures intact. Vascular: Major intracranial vascular flow voids are maintained. Hypoplastic right vertebral artery noted. Skull and upper cervical spine: Craniocervical junction within normal limits. Bone marrow signal intensity normal.  No scalp soft tissue abnormality. Sinuses/Orbits: Chronic changes including lens dislocation at the right globe noted. Chronic right fracture of the right lamina papyracea. Globes and orbital soft tissues demonstrate no acute finding. Right frontal sinus retention cyst. Paranasal sinuses are otherwise clear. Small right mastoid effusion, of doubtful significance. Other: None. IMPRESSION: 1. No acute intracranial abnormality. 2. Age-related cerebral atrophy with mild chronic small vessel ischemic disease. Electronically Signed   By: Jeannine Boga M.D.   On: 07/28/2020 03:23   DG Chest Portable 1 View  Result Date: 07/27/2020 CLINICAL DATA:  Altered mental status. EXAM: PORTABLE CHEST 1 VIEW.  Patient is rotated. COMPARISON:  Chest x-ray 04/11/2020, CT chest 02/15/2018 FINDINGS: The heart size and mediastinal contours are within normal limits. No focal consolidation. No pulmonary edema. No pleural effusion. No pneumothorax. No acute osseous abnormality. IMPRESSION: No active disease. Electronically Signed   By: Iven Finn M.D.   On: 07/27/2020 22:45   CT RENAL STONE STUDY  Result Date: 07/27/2020 CLINICAL DATA:  Acute renal failure.  Confusion. EXAM: CT ABDOMEN AND PELVIS WITHOUT CONTRAST TECHNIQUE: Multidetector CT imaging of the abdomen and pelvis was performed  following the standard protocol without IV contrast. COMPARISON:  CT 07/15/2019 FINDINGS: Lower chest: Normal heart size with coronary artery calcifications. No pleural fluid. Motion artifact limitations. Bilateral gynecomastia. Hepatobiliary: Nodular hepatic contours. No obvious focal hepatic abnormality on noncontrast exam, lower liver partially obscured by motion. There is an 18 mm lamellated gallstone. No pericholecystic inflammation. Common bile duct is not well-defined, there is no evidence of biliary dilatation. Pancreas: No ductal dilatation or inflammation. Spleen: Normal in size without focal abnormality.  No splenomegaly. Adrenals/Urinary Tract: No adrenal nodule. Punctate nonobstructing stone in the lower right kidney. No hydronephrosis. No perinephric edema. No left renal stone, hydronephrosis or perinephric stranding both ureters are decompressed. Urinary bladder is minimally distended, partially obscured by streak artifact from left hip arthroplasty. Stomach/Bowel: Fluid distended stomach. No small bowel dilatation or obstruction. Short segment of small bowel loops extend into a small right inguinal hernia. No associated wall thickening. The appendix is normal. Moderate volume of stool throughout the colon. Sigmoid colon is tortuous and courses into the upper abdomen. No abnormal rectal distention. Vascular/Lymphatic: Aorto bi-iliac atherosclerosis without aneurysm. No bulky abdominopelvic adenopathy. There may be a few prominent periportal nodes that are motion obscured. Reproductive: Prostate is unremarkable. Other: Right inguinal hernia contains fat and short segment of noninflamed nonobstructed small bowel. There is minimal fat in the left inguinal canal. Small fat containing umbilical hernia no significant ascites. No free air. Musculoskeletal: Remote ununited right posterior eleventh rib fracture. Left hip arthroplasty. 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 There is lower lumbar degenerative disc disease. IMPRESSION: 1. No hydronephrosis or obstructive uropathy. Punctate nonobstructing stone in the lower right kidney. 2. Right inguinal hernia contains fat and short segment of noninflamed small bowel. No bowel obstruction or inflammation. 3. Nodular hepatic contours suspicious for cirrhosis. 4. Cholelithiasis without gallbladder inflammation. Aortic Atherosclerosis (ICD10-I70.0). Electronically Signed   By: Keith Rake M.D.   On: 07/27/2020 23:15     Labs:   Basic Metabolic Panel: Recent Labs  Lab 08/03/20 0143 08/03/20 0143 08/07/20 0834 08/09/20 0127  NA 140  --  133* 135  K 3.3*   < > 4.3 3.9  CL 111  --  109 109  CO2 19*  --  15* 17*  GLUCOSE 143*  --  105*  102*  BUN 26*  --  22* 22*  CREATININE 1.56*  --  1.47* 1.32*  CALCIUM 10.3  --  10.5* 10.1  MG 2.0  --  1.7 2.0  PHOS  --   --  2.6  --    < > = values in this interval not displayed.   GFR Estimated Creatinine Clearance: 55.6 mL/min (A) (by C-G formula based on SCr of 1.32 mg/dL (H)). Liver Function Tests: Recent Labs  Lab 08/03/20 0143 08/07/20 0834  AST 39 35  ALT 28 29  ALKPHOS 79 96  BILITOT 1.0 0.9  PROT 7.7 8.0  ALBUMIN 2.9* 3.0*   No results for input(s): LIPASE, AMYLASE in the last 168 hours. Recent Labs  Lab 08/07/20 0834  AMMONIA 54*   Coagulation profile No results for input(s): INR, PROTIME in the last 168 hours.  CBC: Recent Labs  Lab 08/03/20 0143 08/06/20 0106 08/07/20 0834 08/09/20 0127  WBC 11.6* 12.3*  12.2* 12.6* 8.9  NEUTROABS 5.1 7.2 7.9* 3.4  HGB 11.8* 11.6*  11.5* 11.5* 10.5*  HCT 36.1* 34.8*  34.2* 34.1* 30.7*  MCV 98.4 97.2  95.8 94.5 95.0  PLT 230 209  206 188 179   Cardiac Enzymes: No results for input(s): CKTOTAL, CKMB, CKMBINDEX, TROPONINI in the last 168 hours. BNP: Invalid input(s): POCBNP CBG: Recent Labs  Lab 08/02/20 1614 08/03/20 0358   GLUCAP 101* 93   D-Dimer No results for input(s): DDIMER in the last 72 hours. Hgb A1c No results for input(s): HGBA1C in the last 72 hours. Lipid Profile No results for input(s): CHOL, HDL, LDLCALC, TRIG, CHOLHDL, LDLDIRECT in the last 72 hours. Thyroid function studies No results for input(s): TSH, T4TOTAL, T3FREE, THYROIDAB in the last 72 hours.  Invalid input(s): FREET3 Anemia work up No results for input(s): VITAMINB12, FOLATE, FERRITIN, TIBC, IRON, RETICCTPCT in the last 72 hours. Microbiology Recent Results (from the past 240 hour(s))  Culture, blood (routine x 2)     Status: Abnormal   Collection Time: 08/06/20  1:10 AM   Specimen: BLOOD  Result Value Ref Range Status   Specimen Description BLOOD RIGHT ANTECUBITAL  Final   Special Requests   Final    BOTTLES DRAWN AEROBIC AND ANAEROBIC Blood Culture adequate volume   Culture  Setup Time   Final    GRAM NEGATIVE RODS AEROBIC BOTTLE ONLY CRITICAL VALUE NOTED.  VALUE IS CONSISTENT WITH PREVIOUSLY REPORTED AND CALLED VALUE.    Culture (A)  Final    ESCHERICHIA COLI SUSCEPTIBILITIES PERFORMED ON PREVIOUS CULTURE WITHIN THE LAST 5 DAYS. Performed at [facility], [place] [address] [place], [place] [address]    Report Status 08/08/2020 FINAL  Final  Culture, blood (routine x 2)     Status: Abnormal   Collection Time: 08/06/20  1:14 AM   Specimen: BLOOD LEFT HAND  Result Value Ref Range Status   Specimen Description BLOOD LEFT HAND  Final   Special Requests   Final    BOTTLES DRAWN AEROBIC AND ANAEROBIC Blood Culture results may not be optimal due to an excessive volume of blood received in culture bottles   Culture  Setup Time   Final    GRAM NEGATIVE RODS IN BOTH AEROBIC AND ANAEROBIC BOTTLES CRITICAL RESULT CALLED TO, READ BACK BY AND VERIFIED WITH: Earmon Phoenix [phone] FCP Performed at [facility], [place] [address], [place], [place] [address]    Culture ESCHERICHIA COLI (A)  Final   Report  Status 08/08/2020 FINAL  Final   Organism ID, Bacteria ESCHERICHIA COLI  Final      Susceptibility   Escherichia coli - MIC*    AMPICILLIN <=2 SENSITIVE Sensitive     CEFAZOLIN <=4 SENSITIVE Sensitive     CEFEPIME <=0.12 SENSITIVE Sensitive     CEFTAZIDIME <=1 SENSITIVE Sensitive     CEFTRIAXONE <=0.25 SENSITIVE Sensitive     CIPROFLOXACIN <=0.25 SENSITIVE Sensitive     GENTAMICIN <=1 SENSITIVE Sensitive     IMIPENEM <=0.25 SENSITIVE Sensitive     TRIMETH/SULFA <=20 SENSITIVE Sensitive     AMPICILLIN/SULBACTAM <=2 SENSITIVE Sensitive     PIP/TAZO <=4 SENSITIVE Sensitive     * ESCHERICHIA COLI  Blood Culture ID Panel (Reflexed)     Status: Abnormal   Collection Time: 08/06/20  1:14 AM  Result Value Ref Range Status   Enterococcus faecalis NOT DETECTED NOT DETECTED Final   Enterococcus Faecium NOT DETECTED NOT DETECTED Final   Listeria monocytogenes NOT DETECTED NOT DETECTED Final   Staphylococcus species NOT DETECTED NOT DETECTED Final   Staphylococcus aureus (BCID) NOT DETECTED NOT DETECTED Final   Staphylococcus epidermidis NOT DETECTED NOT DETECTED Final   Staphylococcus lugdunensis NOT DETECTED NOT DETECTED Final   Streptococcus species NOT DETECTED NOT DETECTED Final   Streptococcus agalactiae NOT DETECTED NOT DETECTED Final   Streptococcus pneumoniae NOT DETECTED NOT DETECTED Final   Streptococcus pyogenes NOT DETECTED NOT DETECTED Final   A.calcoaceticus-baumannii NOT DETECTED NOT DETECTED Final   Bacteroides fragilis NOT DETECTED NOT DETECTED Final   Enterobacterales DETECTED (A) NOT DETECTED Final    Comment: Enterobacterales represent a large order of gram negative bacteria, not a single organism. CRITICAL RESULT CALLED TO, READ BACK BY AND VERIFIED WITH: PHARMD H. VONDOHLEN [phone] FCP    Enterobacter cloacae complex NOT DETECTED NOT DETECTED Final   Escherichia coli DETECTED (A) NOT DETECTED Final    Comment: CRITICAL RESULT CALLED TO, READ BACK BY AND VERIFIED  WITH: PHARMD H. VONDOHLEN [phone] FCP    Klebsiella aerogenes NOT DETECTED NOT DETECTED Final   Klebsiella oxytoca NOT DETECTED NOT DETECTED Final   Klebsiella pneumoniae NOT DETECTED NOT DETECTED Final   Proteus species NOT DETECTED NOT DETECTED Final   Salmonella species NOT DETECTED NOT DETECTED Final   Serratia marcescens NOT DETECTED NOT DETECTED Final   Haemophilus influenzae NOT DETECTED NOT DETECTED Final   Neisseria meningitidis NOT DETECTED NOT DETECTED Final   Pseudomonas aeruginosa NOT DETECTED NOT DETECTED Final   Stenotrophomonas maltophilia NOT DETECTED NOT DETECTED Final   Candida albicans NOT DETECTED NOT DETECTED Final   Candida auris NOT DETECTED NOT DETECTED Final   Candida glabrata NOT DETECTED NOT DETECTED Final   Candida krusei NOT DETECTED NOT DETECTED Final   Candida parapsilosis NOT DETECTED NOT DETECTED Final   Candida tropicalis NOT DETECTED NOT DETECTED Final   Cryptococcus neoformans/gattii NOT DETECTED NOT DETECTED Final   CTX-M ESBL NOT DETECTED NOT DETECTED Final   Carbapenem resistance IMP NOT DETECTED NOT DETECTED Final   Carbapenem resistance KPC NOT DETECTED NOT DETECTED Final   Carbapenem resistance NDM NOT DETECTED NOT DETECTED Final   Carbapenem resist OXA 48 LIKE NOT DETECTED NOT DETECTED Final   Carbapenem resistance VIM NOT DETECTED NOT DETECTED Final    Comment: Performed at [facility], [address] [place], [place] [address]  Culture, Urine     Status: Abnormal   Collection Time: 08/06/20  9:47 AM   Specimen: Urine, Random  Result Value Ref Range Status   Specimen Description URINE, RANDOM  Final   Special Requests   Final    NONE Performed at [facility], [address] [place], [place] [address]    Culture 80,000 COLONIES/mL ESCHERICHIA COLI (A)  Final   Report Status 08/08/2020 FINAL  Final   Organism ID, Bacteria ESCHERICHIA COLI (A)  Final      Susceptibility   Escherichia coli - MIC*    AMPICILLIN <=2  SENSITIVE Sensitive     CEFAZOLIN <=4 SENSITIVE Sensitive     CEFEPIME <=0.12 SENSITIVE Sensitive     CEFTRIAXONE <=0.25 SENSITIVE Sensitive     CIPROFLOXACIN <=0.25 SENSITIVE Sensitive     GENTAMICIN <=1 SENSITIVE Sensitive     IMIPENEM <=0.25 SENSITIVE Sensitive     NITROFURANTOIN <=16 SENSITIVE Sensitive     TRIMETH/SULFA <=20 SENSITIVE Sensitive     AMPICILLIN/SULBACTAM <=2 SENSITIVE Sensitive     PIP/TAZO <=4 SENSITIVE Sensitive     * 80,000 COLONIES/mL ESCHERICHIA COLI     Signed: Yosgart Pavey  [facility] 08/09/2020, 11:51 AM

## 2020-08-09 NOTE — Progress Notes (Signed)
Chart reviewed.  Communicated with [place] attending.  Patient much improved, walking, coherent, creatinine back to baseline.  However he has serious illness (ETOH Cirrhosis).  Recommend he be followed by outpatient Palliative Care.    Florentina Jenny, PA-C Palliative Medicine Office:  [phone]

## 2020-08-09 NOTE — Progress Notes (Signed)
Pt. Discharged in stable condition via car with daughter.

## 2020-08-23 ENCOUNTER — Ambulatory Visit: Payer: Medicare Other | Admitting: Pulmonary Disease

## 2020-09-25 ENCOUNTER — Ambulatory Visit
Admission: RE | Admit: 2020-09-25 | Discharge: 2020-09-25 | Disposition: A | Discharge status: Home / Self Care | Payer: Medicare Other | Source: Ambulatory Visit | Attending: Gastroenterology | Admitting: Gastroenterology

## 2020-09-25 DIAGNOSIS — K7011 Alcoholic hepatitis with ascites: Secondary | ICD-10-CM

## 2020-10-11 DIAGNOSIS — M25511 Pain in right shoulder: Secondary | ICD-10-CM | POA: Diagnosis not present

## 2020-10-12 DIAGNOSIS — M542 Cervicalgia: Secondary | ICD-10-CM | POA: Diagnosis not present

## 2020-10-12 DIAGNOSIS — M546 Pain in thoracic spine: Secondary | ICD-10-CM | POA: Diagnosis not present

## 2020-10-12 DIAGNOSIS — F17200 Nicotine dependence, unspecified, uncomplicated: Secondary | ICD-10-CM | POA: Diagnosis not present

## 2020-10-12 DIAGNOSIS — Z79899 Other long term (current) drug therapy: Secondary | ICD-10-CM | POA: Diagnosis not present

## 2020-10-12 DIAGNOSIS — F17210 Nicotine dependence, cigarettes, uncomplicated: Secondary | ICD-10-CM | POA: Diagnosis not present

## 2020-10-12 DIAGNOSIS — M5416 Radiculopathy, lumbar region: Secondary | ICD-10-CM | POA: Diagnosis not present

## 2020-10-12 DIAGNOSIS — G8929 Other chronic pain: Secondary | ICD-10-CM | POA: Diagnosis not present

## 2020-10-29 DIAGNOSIS — R899 Unspecified abnormal finding in specimens from other organs, systems and tissues: Secondary | ICD-10-CM | POA: Diagnosis not present

## 2020-11-11 DIAGNOSIS — M25511 Pain in right shoulder: Secondary | ICD-10-CM | POA: Diagnosis not present

## 2020-11-12 DIAGNOSIS — M5416 Radiculopathy, lumbar region: Secondary | ICD-10-CM | POA: Diagnosis not present

## 2020-11-12 DIAGNOSIS — G8929 Other chronic pain: Secondary | ICD-10-CM | POA: Diagnosis not present

## 2020-11-12 DIAGNOSIS — M546 Pain in thoracic spine: Secondary | ICD-10-CM | POA: Diagnosis not present

## 2020-11-12 DIAGNOSIS — M542 Cervicalgia: Secondary | ICD-10-CM | POA: Diagnosis not present

## 2020-11-13 DIAGNOSIS — D649 Anemia, unspecified: Secondary | ICD-10-CM | POA: Diagnosis not present

## 2020-11-13 DIAGNOSIS — G8929 Other chronic pain: Secondary | ICD-10-CM | POA: Diagnosis not present

## 2020-11-13 DIAGNOSIS — I10 Essential (primary) hypertension: Secondary | ICD-10-CM | POA: Diagnosis not present

## 2020-11-13 DIAGNOSIS — E785 Hyperlipidemia, unspecified: Secondary | ICD-10-CM | POA: Diagnosis not present

## 2020-11-21 DIAGNOSIS — K7460 Unspecified cirrhosis of liver: Secondary | ICD-10-CM | POA: Diagnosis not present

## 2020-11-21 DIAGNOSIS — D649 Anemia, unspecified: Secondary | ICD-10-CM | POA: Diagnosis not present

## 2020-11-21 DIAGNOSIS — K7290 Hepatic failure, unspecified without coma: Secondary | ICD-10-CM | POA: Diagnosis not present

## 2020-11-21 DIAGNOSIS — Z8619 Personal history of other infectious and parasitic diseases: Secondary | ICD-10-CM | POA: Diagnosis not present

## 2020-11-26 DIAGNOSIS — Z01812 Encounter for preprocedural laboratory examination: Secondary | ICD-10-CM | POA: Diagnosis not present

## 2020-11-29 DIAGNOSIS — K2930 Chronic superficial gastritis without bleeding: Secondary | ICD-10-CM | POA: Diagnosis not present

## 2020-11-29 DIAGNOSIS — I8510 Secondary esophageal varices without bleeding: Secondary | ICD-10-CM | POA: Diagnosis not present

## 2020-11-29 DIAGNOSIS — K7460 Unspecified cirrhosis of liver: Secondary | ICD-10-CM | POA: Diagnosis not present

## 2020-11-29 DIAGNOSIS — D509 Iron deficiency anemia, unspecified: Secondary | ICD-10-CM | POA: Diagnosis not present

## 2020-12-03 ENCOUNTER — Other Ambulatory Visit: Payer: Self-pay | Admitting: Gastroenterology

## 2020-12-04 DIAGNOSIS — I10 Essential (primary) hypertension: Secondary | ICD-10-CM | POA: Diagnosis not present

## 2020-12-04 DIAGNOSIS — E785 Hyperlipidemia, unspecified: Secondary | ICD-10-CM | POA: Diagnosis not present

## 2020-12-04 DIAGNOSIS — G8929 Other chronic pain: Secondary | ICD-10-CM | POA: Diagnosis not present

## 2020-12-04 DIAGNOSIS — D649 Anemia, unspecified: Secondary | ICD-10-CM | POA: Diagnosis not present

## 2020-12-05 DIAGNOSIS — K2930 Chronic superficial gastritis without bleeding: Secondary | ICD-10-CM | POA: Diagnosis not present

## 2020-12-09 DIAGNOSIS — M25511 Pain in right shoulder: Secondary | ICD-10-CM | POA: Diagnosis not present

## 2020-12-11 DIAGNOSIS — Z79899 Other long term (current) drug therapy: Secondary | ICD-10-CM | POA: Diagnosis not present

## 2020-12-11 DIAGNOSIS — M109 Gout, unspecified: Secondary | ICD-10-CM | POA: Diagnosis not present

## 2020-12-11 DIAGNOSIS — M25552 Pain in left hip: Secondary | ICD-10-CM | POA: Diagnosis not present

## 2020-12-11 DIAGNOSIS — M5416 Radiculopathy, lumbar region: Secondary | ICD-10-CM | POA: Diagnosis not present

## 2020-12-11 DIAGNOSIS — F17210 Nicotine dependence, cigarettes, uncomplicated: Secondary | ICD-10-CM | POA: Diagnosis not present

## 2020-12-11 DIAGNOSIS — E559 Vitamin D deficiency, unspecified: Secondary | ICD-10-CM | POA: Diagnosis not present

## 2020-12-11 DIAGNOSIS — F17200 Nicotine dependence, unspecified, uncomplicated: Secondary | ICD-10-CM | POA: Diagnosis not present

## 2020-12-11 DIAGNOSIS — G8929 Other chronic pain: Secondary | ICD-10-CM | POA: Diagnosis not present

## 2020-12-24 DIAGNOSIS — Z23 Encounter for immunization: Secondary | ICD-10-CM | POA: Diagnosis not present

## 2020-12-24 DIAGNOSIS — I10 Essential (primary) hypertension: Secondary | ICD-10-CM | POA: Diagnosis not present

## 2020-12-24 DIAGNOSIS — E559 Vitamin D deficiency, unspecified: Secondary | ICD-10-CM | POA: Diagnosis not present

## 2020-12-24 DIAGNOSIS — K7460 Unspecified cirrhosis of liver: Secondary | ICD-10-CM | POA: Diagnosis not present

## 2021-01-02 DIAGNOSIS — I10 Essential (primary) hypertension: Secondary | ICD-10-CM | POA: Diagnosis not present

## 2021-01-02 DIAGNOSIS — G8929 Other chronic pain: Secondary | ICD-10-CM | POA: Diagnosis not present

## 2021-01-02 DIAGNOSIS — D649 Anemia, unspecified: Secondary | ICD-10-CM | POA: Diagnosis not present

## 2021-01-02 DIAGNOSIS — E785 Hyperlipidemia, unspecified: Secondary | ICD-10-CM | POA: Diagnosis not present

## 2021-01-09 DIAGNOSIS — M25511 Pain in right shoulder: Secondary | ICD-10-CM | POA: Diagnosis not present

## 2021-01-11 DIAGNOSIS — G8929 Other chronic pain: Secondary | ICD-10-CM | POA: Diagnosis not present

## 2021-01-11 DIAGNOSIS — M25552 Pain in left hip: Secondary | ICD-10-CM | POA: Diagnosis not present

## 2021-01-11 DIAGNOSIS — Z79899 Other long term (current) drug therapy: Secondary | ICD-10-CM | POA: Diagnosis not present

## 2021-01-11 DIAGNOSIS — M5416 Radiculopathy, lumbar region: Secondary | ICD-10-CM | POA: Diagnosis not present

## 2021-01-11 DIAGNOSIS — F17210 Nicotine dependence, cigarettes, uncomplicated: Secondary | ICD-10-CM | POA: Diagnosis not present

## 2021-01-11 DIAGNOSIS — F17200 Nicotine dependence, unspecified, uncomplicated: Secondary | ICD-10-CM | POA: Diagnosis not present

## 2021-01-11 DIAGNOSIS — Z96642 Presence of left artificial hip joint: Secondary | ICD-10-CM | POA: Diagnosis not present

## 2021-01-22 ENCOUNTER — Other Ambulatory Visit (HOSPITAL_COMMUNITY)
Admission: RE | Admit: 2021-01-22 | Discharge: 2021-01-22 | Disposition: A | Discharge status: Home / Self Care | Payer: Medicare Other | Source: Ambulatory Visit | Attending: Gastroenterology | Admitting: Gastroenterology

## 2021-01-22 ENCOUNTER — Other Ambulatory Visit: Payer: Self-pay

## 2021-01-22 DIAGNOSIS — Z20822 Contact with and (suspected) exposure to covid-19: Secondary | ICD-10-CM | POA: Insufficient documentation

## 2021-01-22 DIAGNOSIS — Z01812 Encounter for preprocedural laboratory examination: Secondary | ICD-10-CM | POA: Insufficient documentation

## 2021-01-22 LAB — SARS CORONAVIRUS 2 (TAT 6-24 HRS): SARS Coronavirus 2: NEGATIVE

## 2021-01-25 ENCOUNTER — Encounter (HOSPITAL_COMMUNITY): Payer: Self-pay | Admitting: Gastroenterology

## 2021-01-25 ENCOUNTER — Ambulatory Visit (HOSPITAL_COMMUNITY): Payer: Medicare Other | Admitting: Certified Registered Nurse Anesthetist

## 2021-01-25 ENCOUNTER — Encounter (HOSPITAL_COMMUNITY)
Admission: RE | Disposition: A | Discharge status: Home / Self Care | Payer: Self-pay | Source: Home / Self Care | Attending: Gastroenterology

## 2021-01-25 ENCOUNTER — Other Ambulatory Visit: Payer: Self-pay

## 2021-01-25 ENCOUNTER — Ambulatory Visit (HOSPITAL_COMMUNITY)
Admission: RE | Admit: 2021-01-25 | Discharge: 2021-01-25 | Disposition: A | Discharge status: Home / Self Care | Payer: Medicare Other | Attending: Gastroenterology | Admitting: Gastroenterology

## 2021-01-25 DIAGNOSIS — Z8619 Personal history of other infectious and parasitic diseases: Secondary | ICD-10-CM | POA: Insufficient documentation

## 2021-01-25 DIAGNOSIS — E785 Hyperlipidemia, unspecified: Secondary | ICD-10-CM | POA: Diagnosis not present

## 2021-01-25 DIAGNOSIS — I8510 Secondary esophageal varices without bleeding: Secondary | ICD-10-CM | POA: Diagnosis not present

## 2021-01-25 DIAGNOSIS — Z79899 Other long term (current) drug therapy: Secondary | ICD-10-CM | POA: Insufficient documentation

## 2021-01-25 DIAGNOSIS — K7030 Alcoholic cirrhosis of liver without ascites: Secondary | ICD-10-CM | POA: Insufficient documentation

## 2021-01-25 DIAGNOSIS — K2970 Gastritis, unspecified, without bleeding: Secondary | ICD-10-CM | POA: Insufficient documentation

## 2021-01-25 DIAGNOSIS — F17210 Nicotine dependence, cigarettes, uncomplicated: Secondary | ICD-10-CM | POA: Diagnosis not present

## 2021-01-25 DIAGNOSIS — I8500 Esophageal varices without bleeding: Secondary | ICD-10-CM | POA: Diagnosis not present

## 2021-01-25 DIAGNOSIS — I10 Essential (primary) hypertension: Secondary | ICD-10-CM | POA: Diagnosis not present

## 2021-01-25 HISTORY — PX: ESOPHAGEAL BANDING: SHX5518

## 2021-01-25 HISTORY — PX: ESOPHAGOGASTRODUODENOSCOPY (EGD) WITH PROPOFOL: SHX5813

## 2021-01-25 SURGERY — ESOPHAGOGASTRODUODENOSCOPY (EGD) WITH PROPOFOL
Anesthesia: Monitor Anesthesia Care

## 2021-01-25 MED ORDER — PROPOFOL 10 MG/ML IV BOLUS
INTRAVENOUS | Status: DC | PRN
  Administered 2021-01-25: 20 mg via INTRAVENOUS
  Administered 2021-01-25 (×4): 40 mg via INTRAVENOUS
  Administered 2021-01-25: 20 mg via INTRAVENOUS

## 2021-01-25 MED ORDER — LIDOCAINE 2% (20 MG/ML) 5 ML SYRINGE
INTRAMUSCULAR | Status: DC | PRN
  Administered 2021-01-25: 100 mg via INTRAVENOUS

## 2021-01-25 MED ORDER — PROPOFOL 10 MG/ML IV BOLUS
INTRAVENOUS | Status: AC
  Filled 2021-01-25: qty 20

## 2021-01-25 MED ORDER — SODIUM CHLORIDE 0.9 % IV SOLN: INTRAVENOUS | Status: DC

## 2021-01-25 MED ORDER — PROPOFOL 500 MG/50ML IV EMUL
INTRAVENOUS | Status: DC | PRN
  Administered 2021-01-25: 125 ug/kg/min via INTRAVENOUS

## 2021-01-25 MED ORDER — PROPOFOL 500 MG/50ML IV EMUL
INTRAVENOUS | Status: AC
  Filled 2021-01-25: qty 50

## 2021-01-25 MED ORDER — LACTATED RINGERS IV SOLN: INTRAVENOUS | Status: DC

## 2021-01-25 MED ORDER — PHENYLEPHRINE 40 MCG/ML (10ML) SYRINGE FOR IV PUSH (FOR BLOOD PRESSURE SUPPORT)
PREFILLED_SYRINGE | INTRAVENOUS | Status: DC | PRN
  Administered 2021-01-25: 120 ug via INTRAVENOUS

## 2021-01-25 SURGICAL SUPPLY — 14 items

## 2021-01-25 NOTE — H&P (Signed)
Primary Care Physician:  Jenny Reichmann, PA-C Primary Gastroenterologist:  Dr. Alessandra Bevels  Reason for Visit : EGD for esophageal varices  HPI: Calvin [name] is a 60 y.o. male here for outpatient EGD for variceal band ligation.  Please see previous GI history for detail.  Underwent EGD in March 2022 which showed large esophageal varices and gastritis.  Biopsies were negative for H. Pylori.  Denies any acute GI issues. Denies blood in the stool or black stool. Denies diarrhea or constipation. Denied abdominal pain. Denies reflux, trouble swallowing her pain while swallowing. Overall feeling better from GI standpoint.   Previous GI history  past medical history of hepatitis C and alcoholic cirrhosis. Please see previous GI history for detail. History of hepatitis C - status post treatment with Epclusa which he completed in April 2021. Was last in office in June 2021. Negative viral load in June 2021. Blood work in June 2021 showed elevated creatinine and was advised to stop diuretics. He was also found to mild anemia with hemoglobin of around 10.7. Repeat blood work in August 2021 showed improvement in creatinine to around 1.17. She was subsequently admitted to the hospital in November 2021 with acute kidney injury with creatinine of around 8 and encephalopathy. He was also diagnosed with Escherichia coli bacteremia.        Ultrasound right upper quadrant January 2022 showed cirrhosis and cholelithiasis. No acute changes.        He quit alcohol in December 2021.          Ultrasound in May 2021 showed cirrhosis and mild thickening of the gallbladder. Follow-up HIDA scan was normal.  Past Medical History:  Diagnosis Date  . Ascites   . Chronic left hip pain   . Cirrhosis ([place])   . Hypertension   . Stroke ([place])   . TIA (transient ischemic attack) 06/23/2016    Past Surgical History:  Procedure Laterality Date  . BACK SURGERY    . CERVICAL SPINE SURGERY    . JOINT REPLACEMENT       Prior to Admission medications   Medication Sig Start Date End Date Taking? Authorizing Provider  allopurinol (ZYLOPRIM) 300 MG tablet Take 300 mg by mouth daily. 10/22/20  Yes [provider]  amLODipine (NORVASC) 5 MG tablet Take 5 mg by mouth daily. 10/10/20  Yes [provider]  folic acid (FOLVITE) 1 MG tablet Take 1 mg by mouth daily. 02/14/20  Yes [provider]  furosemide (LASIX) 20 MG tablet Take 20 mg by mouth daily. 02/14/20  Yes [provider]  lactulose (CHRONULAC) 10 GM/15ML solution Take 30 mLs (20 g total) by mouth 2 (two) times daily. 04/07/20  Yes Elgergawy, Silver Huguenin, MD  nadolol (CORGARD) 20 MG tablet Take 20 mg by mouth daily. 01/09/21  Yes [provider]  Oxycodone HCl 10 MG TABS Take 10 mg by mouth 5 (five) times daily as needed (pain).   Yes [provider]  propranolol (INDERAL) 10 MG tablet Take 1 tablet (10 mg total) by mouth 2 (two) times daily. 04/07/20  Yes Elgergawy, Silver Huguenin, MD  tiZANidine (ZANAFLEX) 4 MG tablet Take 4 mg by mouth 3 (three) times daily as needed for muscle spasms. 11/17/20  Yes [provider]  Vitamin D, Ergocalciferol, (DRISDOL) 1.25 MG (50000 UNIT) CAPS capsule Take 50,000 Units by mouth every Sunday. 03/17/20  Yes [provider]  nicotine (NICODERM CQ - DOSED IN MG/24 HOURS) 21 mg/24hr patch Place 1 patch (21 mg  total) onto the skin daily. Patient not taking: Reported on 01/17/2021 04/08/20   Elgergawy, Silver Huguenin, MD  QUEtiapine (SEROQUEL) 25 MG tablet Take 1 tablet (25 mg total) by mouth at bedtime as needed (psychosis, confusion, agitaiton). 08/09/20 09/08/20  Terrilee Croak, MD  thiamine 100 MG tablet Take 1 tablet (100 mg total) by mouth daily. Patient not taking: Reported on 01/17/2021 04/08/20   Elgergawy, Silver Huguenin, MD    Scheduled Meds: Continuous Infusions: . lactated ringers 20 mL/hr at 01/25/21 0842   PRN Meds:.  Allergies as of 12/03/2020  . (No Known  Allergies)    Family History  Family history unknown: Yes    Social History   Socioeconomic History  . Marital status: Married    Spouse name: Not on file  . Number of children: Not on file  . Years of education: Not on file  . Highest education level: Not on file  Occupational History  . Not on file  Tobacco Use  . Smoking status: Current Every Day Smoker    Types: Cigarettes  . Smokeless tobacco: Never Used  . Tobacco comment: 1/2 pack per day  Substance and Sexual Activity  . Alcohol use: Yes    Comment: 2 beers/day  . Drug use: No  . Sexual activity: Not on file  Other Topics Concern  . Not on file  Social History Narrative  . Not on file   Social Determinants of Health   Financial Resource Strain: Not on file  Food Insecurity: Not on file  Transportation Needs: Not on file  Physical Activity: Not on file  Stress: Not on file  Social Connections: Not on file  Intimate Partner Violence: Not on file     Physical Exam: Vital signs: Vitals:   01/25/21 0824  BP: (!) 138/91  Pulse: 75  Resp: 18  Temp: 98.6 F (37 C)  SpO2: 97%     General:   Alert,  Well-developed, well-nourished, pleasant and cooperative in NAD Lungs:  Clear throughout to auscultation.   No wheezes, crackles, or rhonchi. No acute distress. Heart:  Regular rate and rhythm; no murmurs, clicks, rubs,  or gallops. Abdomen: Soft, nontender, nondistended, bowel sounds present.  No peritoneal signs Rectal:  Deferred  GI:  Lab Results: No results for input(s): WBC, HGB, HCT, PLT in the last 72 hours. BMET No results for input(s): NA, K, CL, CO2, GLUCOSE, BUN, CREATININE, CALCIUM in the last 72 hours. LFT No results for input(s): PROT, ALBUMIN, AST, ALT, ALKPHOS, BILITOT, BILIDIR, IBILI in the last 72 hours. PT/INR No results for input(s): LABPROT, INR in the last 72 hours.   Studies/Results: No results found.  Impression/Plan: -Esophageal varices -Cirrhosis with prior history of  hepatitis C and alcohol use. -History of hepatitis C.  Status posttreatment with Epclusa   Recommendations ---------------------- -Proceed with EGD for band ligation. -Continue nadolol  Risks (bleeding, infection, bowel perforation that could require surgery, sedation-related changes in cardiopulmonary systems), benefits (identification and possible treatment of source of symptoms, exclusion of certain causes of symptoms), and alternatives (watchful waiting, radiographic imaging studies, empiric medical treatment)  were explained to patient/family in detail and patient wishes to proceed.    LOS: 0 days   Otis Brace  MD, FACP 01/25/2021, 9:14 AM  Contact #  [phone]

## 2021-01-25 NOTE — Anesthesia Preprocedure Evaluation (Signed)
Anesthesia Evaluation  Patient identified by MRN, date of birth, ID band Patient awake    Reviewed: Allergy & Precautions, NPO status , Patient's Chart, lab work & pertinent test results  Airway Mallampati: II  TM Distance: >3 FB Neck ROM: Full    Dental no notable dental hx.    Pulmonary Current Smoker,    Pulmonary exam normal breath sounds clear to auscultation       Cardiovascular hypertension, Normal cardiovascular exam Rhythm:Regular Rate:Normal     Neuro/Psych TIACVA negative psych ROS   GI/Hepatic negative GI ROS, (+) Cirrhosis   Esophageal Varices  substance abuse  alcohol use, Hepatitis -  Endo/Other  negative endocrine ROS  Renal/GU negative Renal ROS  negative genitourinary   Musculoskeletal negative musculoskeletal ROS (+)   Abdominal   Peds negative pediatric ROS (+)  Hematology negative hematology ROS (+)   Anesthesia Other Findings   Reproductive/Obstetrics negative OB ROS                             Anesthesia Physical Anesthesia Plan  ASA: IV  Anesthesia Plan: MAC   Post-op Pain Management:    Induction: Intravenous  PONV Risk Score and Plan: 1 and Propofol infusion and Treatment may vary due to age or medical condition  Airway Management Planned: Simple Face Mask  Additional Equipment:   Intra-op Plan:   Post-operative Plan:   Informed Consent: I have reviewed the patients History and Physical, chart, labs and discussed the procedure including the risks, benefits and alternatives for the proposed anesthesia with the patient or authorized representative who has indicated his/her understanding and acceptance.     Dental advisory given  Plan Discussed with: CRNA and Surgeon  Anesthesia Plan Comments:         Anesthesia Quick Evaluation

## 2021-01-25 NOTE — Op Note (Signed)
[facility] Patient Name: Calvin [name] Procedure Date: 01/25/2021 MRN: [id number] Attending MD: Otis Brace , MD Date of Birth: 01-05-1961 CSN: [id number] Age: 60 Admit Type: Outpatient Procedure:                Upper GI endoscopy Indications:              For therapy of esophageal varices Providers:                Otis Brace, MD, Mariana Arn, Theodora Blow,                            Technician Referring MD:              Medicines:                Sedation Administered by an Anesthesia Professional Complications:            No immediate complications. Estimated Blood Loss:     Estimated blood loss was minimal. Procedure:                Pre-Anesthesia Assessment:                           - Prior to the procedure, a History and Physical                            was performed, and patient medications and                            allergies were reviewed. The patient's tolerance of                            previous anesthesia was also reviewed. The risks                            and benefits of the procedure and the sedation                            options and risks were discussed with the patient.                            All questions were answered, and informed consent                            was obtained. Prior Anticoagulants: The patient has                            taken no previous anticoagulant or antiplatelet                            agents. ASA Grade Assessment: III - A patient with                            severe systemic disease. After reviewing the risks  and benefits, the patient was deemed in                            satisfactory condition to undergo the procedure.                           After obtaining informed consent, the endoscope was                            passed under direct vision. Throughout the                            procedure, the patient's blood pressure, pulse, and                             oxygen saturations were monitored continuously. The                            GIF-H190 ([id number]) Olympus gastroscope was                            introduced through the mouth, and advanced to the                            second part of duodenum. The upper GI endoscopy was                            accomplished without difficulty. The patient                            tolerated the procedure well. Scope In: Scope Out: Findings:      Two columns of large (> 5 mm) varices with no bleeding and no stigmata       of recent bleeding were found in the distal esophagus,. No red wale       signs were present. The varices appeared smaller than they were at prior       exam. Two bands were successfully placed with complete eradication,       resulting in deflation of varices. There was no bleeding during and at       the end of the procedure.      Scattered mild inflammation was found in the entire examined stomach.      The cardia and gastric fundus were normal on retroflexion.      The duodenal bulb, first portion of the duodenum and second portion of       the duodenum were normal. Impression:               - Large (> 5 mm) esophageal varices with no                            bleeding and no stigmata of recent bleeding.                            Completely eradicated. Banded.                           -  Gastritis.                           - Normal duodenal bulb, first portion of the                            duodenum and second portion of the duodenum.                           - No specimens collected. Moderate Sedation:      Moderate (conscious) sedation was personally administered by an       anesthesia professional. The following parameters were monitored: oxygen       saturation, heart rate, blood pressure, and response to care. Recommendation:           - Patient has a contact number available for                            emergencies. The signs and symptoms  of potential                            delayed complications were discussed with the                            patient. Return to normal activities tomorrow.                            Written discharge instructions were provided to the                            patient.                           - Resume previous diet.                           - Continue present medications.                           - Repeat upper endoscopy in 3 months for                            retreatment.                           - Return to my office as previously scheduled. Procedure Code(s):        --- Professional ---                           [phone], Esophagogastroduodenoscopy, flexible,                            transoral; with band ligation of esophageal/gastric                            varices Diagnosis Code(s):        --- Professional ---  I85.00, Esophageal varices without bleeding                           K29.70, Gastritis, unspecified, without bleeding CPT copyright 2019 American Medical Association. All rights reserved. The codes documented in this report are preliminary and upon coder review may  be revised to meet current compliance requirements. Otis Brace, MD Otis Brace, MD 01/25/2021 9:49:45 AM Number of Addenda: 0

## 2021-01-25 NOTE — Discharge Instructions (Signed)
YOU HAD AN ENDOSCOPIC PROCEDURE TODAY: Refer to the procedure report and other information in the discharge instructions given to you for any specific questions about what was found during the examination. If this information does not answer your questions, please call [facility] office at [phone] to clarify.   YOU SHOULD EXPECT: Some feelings of bloating in the abdomen. Passage of more gas than usual. Walking can help get rid of the air that was put into your GI tract during the procedure and reduce the bloating. If you had a lower endoscopy (such as a colonoscopy or flexible sigmoidoscopy) you may notice spotting of blood in your stool or on the toilet paper. Some abdominal soreness may be present for a day or two, also.  DIET: Your first meal following the procedure should be a light meal and then it is ok to progress to your normal diet. A half-sandwich or bowl of soup is an example of a good first meal. Heavy or fried foods are harder to digest and may make you feel nauseous or bloated. Drink plenty of fluids but you should avoid alcoholic beverages for 24 hours. If you had a esophageal dilation, please see attached instructions for diet.    ACTIVITY: Your care partner should take you home directly after the procedure. You should plan to take it easy, moving slowly for the rest of the day. You can resume normal activity the day after the procedure however YOU SHOULD NOT DRIVE, use power tools, machinery or perform tasks that involve climbing or major physical exertion for 24 hours (because of the sedation medicines used during the test).   SYMPTOMS TO REPORT IMMEDIATELY: A gastroenterologist can be reached at any hour. Please call [phone]  for any of the following symptoms:   . Following upper endoscopy (EGD, EUS, ERCP, esophageal dilation) Vomiting of blood or coffee ground material  New, significant abdominal pain  New, significant chest pain or pain under the shoulder blades  Painful  or persistently difficult swallowing  New shortness of breath  Black, tarry-looking or red, bloody stools  FOLLOW UP:  If any biopsies were taken you will be contacted by phone or by letter within the next 1-3 weeks. Call [phone]  if you have not heard about the biopsies in 3 weeks.  Please also call with any specific questions about appointments or follow up tests. YOU HAD AN ENDOSCOPIC PROCEDURE TODAY: Refer to the procedure report and other information in the discharge instructions given to you for any specific questions about what was found during the examination. If this information does not answer your questions, please call [facility] office at [phone] to clarify.   YOU SHOULD EXPECT: Some feelings of bloating in the abdomen. Passage of more gas than usual. Walking can help get rid of the air that was put into your GI tract during the procedure and reduce the bloating. If you had a lower endoscopy (such as a colonoscopy or flexible sigmoidoscopy) you may notice spotting of blood in your stool or on the toilet paper. Some abdominal soreness may be present for a day or two, also.  DIET: Your first meal following the procedure should be a light meal and then it is ok to progress to your normal diet. A half-sandwich or bowl of soup is an example of a good first meal. Heavy or fried foods are harder to digest and may make you feel nauseous or bloated. Drink plenty of fluids but you should avoid alcoholic beverages for 24  hours. If you had a esophageal dilation, please see attached instructions for diet.    ACTIVITY: Your care partner should take you home directly after the procedure. You should plan to take it easy, moving slowly for the rest of the day. You can resume normal activity the day after the procedure however YOU SHOULD NOT DRIVE, use power tools, machinery or perform tasks that involve climbing or major physical exertion for 24 hours (because of the sedation medicines used during  the test).   SYMPTOMS TO REPORT IMMEDIATELY: A gastroenterologist can be reached at any hour. Please call [phone]  for any of the following symptoms:  . Following lower endoscopy (colonoscopy, flexible sigmoidoscopy) Excessive amounts of blood in the stool  Significant tenderness, worsening of abdominal pains  Swelling of the abdomen that is new, acute  Fever of 100 or higher  . Following upper endoscopy (EGD, EUS, ERCP, esophageal dilation) Vomiting of blood or coffee ground material  New, significant abdominal pain  New, significant chest pain or pain under the shoulder blades  Painful or persistently difficult swallowing  New shortness of breath  Black, tarry-looking or red, bloody stools  FOLLOW UP:  If any biopsies were taken you will be contacted by phone or by letter within the next 1-3 weeks. Call [phone]  if you have not heard about the biopsies in 3 weeks.  Please also call with any specific questions about appointments or follow up tests.

## 2021-01-25 NOTE — Anesthesia Postprocedure Evaluation (Signed)
Anesthesia Post Note  Patient: Calvin [name]  Procedure(s) Performed: ESOPHAGOGASTRODUODENOSCOPY (EGD) WITH PROPOFOL (N/A ) ESOPHAGEAL BANDING (N/A )     Patient location during evaluation: PACU Anesthesia Type: MAC Level of consciousness: awake and alert Pain management: pain level controlled Vital Signs Assessment: post-procedure vital signs reviewed and stable Respiratory status: spontaneous breathing, nonlabored ventilation, respiratory function stable and patient connected to nasal cannula oxygen Cardiovascular status: stable and blood pressure returned to baseline Postop Assessment: no apparent nausea or vomiting Anesthetic complications: no   No complications documented.  Last Vitals:  Vitals:   01/25/21 1000 01/25/21 1011  BP: 138/89 (!) 158/94  Pulse: 79 74  Resp: 18 (!) 22  Temp:    SpO2: 97% 94%    Last Pain:  Vitals:   01/25/21 1011  TempSrc:   PainSc: 0-No pain                 Kathryn Linarez S

## 2021-01-25 NOTE — Transfer of Care (Signed)
Immediate Anesthesia Transfer of Care Note  Patient: Calvin [name]  Procedure(s) Performed: ESOPHAGOGASTRODUODENOSCOPY (EGD) WITH PROPOFOL (N/A ) ESOPHAGEAL BANDING (N/A )  Patient Location: Endoscopy Unit  Anesthesia Type:MAC  Level of Consciousness: drowsy  Airway & Oxygen Therapy: Patient Spontanous Breathing and Patient connected to face mask oxygen  Post-op Assessment: Report given to RN and Post -op Vital signs reviewed and stable  Post vital signs: Reviewed and stable  Last Vitals:  Vitals Value Taken Time  BP    Temp    Pulse 80 01/25/21 0949  Resp 31 01/25/21 0949  SpO2 100 % 01/25/21 0949  Vitals shown include unvalidated device data.  Last Pain:  Vitals:   01/25/21 0824  TempSrc: Oral  PainSc: 0-No pain         Complications: No complications documented.

## 2021-01-28 ENCOUNTER — Encounter (HOSPITAL_COMMUNITY): Payer: Self-pay | Admitting: Gastroenterology

## 2021-01-31 DIAGNOSIS — E785 Hyperlipidemia, unspecified: Secondary | ICD-10-CM | POA: Diagnosis not present

## 2021-01-31 DIAGNOSIS — I10 Essential (primary) hypertension: Secondary | ICD-10-CM | POA: Diagnosis not present

## 2021-01-31 DIAGNOSIS — D649 Anemia, unspecified: Secondary | ICD-10-CM | POA: Diagnosis not present

## 2021-01-31 DIAGNOSIS — G8929 Other chronic pain: Secondary | ICD-10-CM | POA: Diagnosis not present

## 2021-02-01 ENCOUNTER — Other Ambulatory Visit: Payer: Self-pay | Admitting: Gastroenterology

## 2021-02-08 DIAGNOSIS — M25511 Pain in right shoulder: Secondary | ICD-10-CM | POA: Diagnosis not present

## 2021-02-11 DIAGNOSIS — F17200 Nicotine dependence, unspecified, uncomplicated: Secondary | ICD-10-CM | POA: Diagnosis not present

## 2021-02-11 DIAGNOSIS — M5416 Radiculopathy, lumbar region: Secondary | ICD-10-CM | POA: Diagnosis not present

## 2021-02-11 DIAGNOSIS — G8929 Other chronic pain: Secondary | ICD-10-CM | POA: Diagnosis not present

## 2021-02-11 DIAGNOSIS — Z96642 Presence of left artificial hip joint: Secondary | ICD-10-CM | POA: Diagnosis not present

## 2021-02-11 DIAGNOSIS — F17210 Nicotine dependence, cigarettes, uncomplicated: Secondary | ICD-10-CM | POA: Diagnosis not present

## 2021-02-11 DIAGNOSIS — M25552 Pain in left hip: Secondary | ICD-10-CM | POA: Diagnosis not present

## 2021-02-11 DIAGNOSIS — Z79899 Other long term (current) drug therapy: Secondary | ICD-10-CM | POA: Diagnosis not present

## 2021-03-11 DIAGNOSIS — M25511 Pain in right shoulder: Secondary | ICD-10-CM | POA: Diagnosis not present

## 2021-03-13 DIAGNOSIS — M25552 Pain in left hip: Secondary | ICD-10-CM | POA: Diagnosis not present

## 2021-03-13 DIAGNOSIS — M5416 Radiculopathy, lumbar region: Secondary | ICD-10-CM | POA: Diagnosis not present

## 2021-03-13 DIAGNOSIS — F17200 Nicotine dependence, unspecified, uncomplicated: Secondary | ICD-10-CM | POA: Diagnosis not present

## 2021-03-13 DIAGNOSIS — R030 Elevated blood-pressure reading, without diagnosis of hypertension: Secondary | ICD-10-CM | POA: Diagnosis not present

## 2021-03-13 DIAGNOSIS — Z96642 Presence of left artificial hip joint: Secondary | ICD-10-CM | POA: Diagnosis not present

## 2021-03-13 DIAGNOSIS — G8929 Other chronic pain: Secondary | ICD-10-CM | POA: Diagnosis not present

## 2021-03-13 DIAGNOSIS — F17210 Nicotine dependence, cigarettes, uncomplicated: Secondary | ICD-10-CM | POA: Diagnosis not present

## 2021-03-13 DIAGNOSIS — Z79899 Other long term (current) drug therapy: Secondary | ICD-10-CM | POA: Diagnosis not present

## 2021-03-15 DIAGNOSIS — Z79899 Other long term (current) drug therapy: Secondary | ICD-10-CM | POA: Diagnosis not present

## 2021-04-03 DIAGNOSIS — E785 Hyperlipidemia, unspecified: Secondary | ICD-10-CM | POA: Diagnosis not present

## 2021-04-03 DIAGNOSIS — I10 Essential (primary) hypertension: Secondary | ICD-10-CM | POA: Diagnosis not present

## 2021-04-03 DIAGNOSIS — G8929 Other chronic pain: Secondary | ICD-10-CM | POA: Diagnosis not present

## 2021-04-03 DIAGNOSIS — D649 Anemia, unspecified: Secondary | ICD-10-CM | POA: Diagnosis not present

## 2021-04-10 DIAGNOSIS — M25511 Pain in right shoulder: Secondary | ICD-10-CM | POA: Diagnosis not present

## 2021-04-12 DIAGNOSIS — R030 Elevated blood-pressure reading, without diagnosis of hypertension: Secondary | ICD-10-CM | POA: Diagnosis not present

## 2021-04-12 DIAGNOSIS — F17210 Nicotine dependence, cigarettes, uncomplicated: Secondary | ICD-10-CM | POA: Diagnosis not present

## 2021-04-12 DIAGNOSIS — Z96642 Presence of left artificial hip joint: Secondary | ICD-10-CM | POA: Diagnosis not present

## 2021-04-12 DIAGNOSIS — M25552 Pain in left hip: Secondary | ICD-10-CM | POA: Diagnosis not present

## 2021-04-12 DIAGNOSIS — F17200 Nicotine dependence, unspecified, uncomplicated: Secondary | ICD-10-CM | POA: Diagnosis not present

## 2021-04-12 DIAGNOSIS — G8929 Other chronic pain: Secondary | ICD-10-CM | POA: Diagnosis not present

## 2021-04-12 DIAGNOSIS — M5416 Radiculopathy, lumbar region: Secondary | ICD-10-CM | POA: Diagnosis not present

## 2021-04-15 DIAGNOSIS — Z79899 Other long term (current) drug therapy: Secondary | ICD-10-CM | POA: Diagnosis not present

## 2021-05-11 DIAGNOSIS — M25511 Pain in right shoulder: Secondary | ICD-10-CM | POA: Diagnosis not present

## 2021-05-13 DIAGNOSIS — M25552 Pain in left hip: Secondary | ICD-10-CM | POA: Diagnosis not present

## 2021-05-13 DIAGNOSIS — M5416 Radiculopathy, lumbar region: Secondary | ICD-10-CM | POA: Diagnosis not present

## 2021-05-13 DIAGNOSIS — F17210 Nicotine dependence, cigarettes, uncomplicated: Secondary | ICD-10-CM | POA: Diagnosis not present

## 2021-05-13 DIAGNOSIS — G8929 Other chronic pain: Secondary | ICD-10-CM | POA: Diagnosis not present

## 2021-05-13 DIAGNOSIS — Z79899 Other long term (current) drug therapy: Secondary | ICD-10-CM | POA: Diagnosis not present

## 2021-05-13 DIAGNOSIS — Z96642 Presence of left artificial hip joint: Secondary | ICD-10-CM | POA: Diagnosis not present

## 2021-05-14 IMAGING — CR DG CHEST 2V
2 series · 2 of 2 positions shown · non-contrast
Comparison: 07/15/2019

CLINICAL DATA: Bilateral lower extremity edema.

EXAM:
CHEST - 2 VIEW

[chest lat]
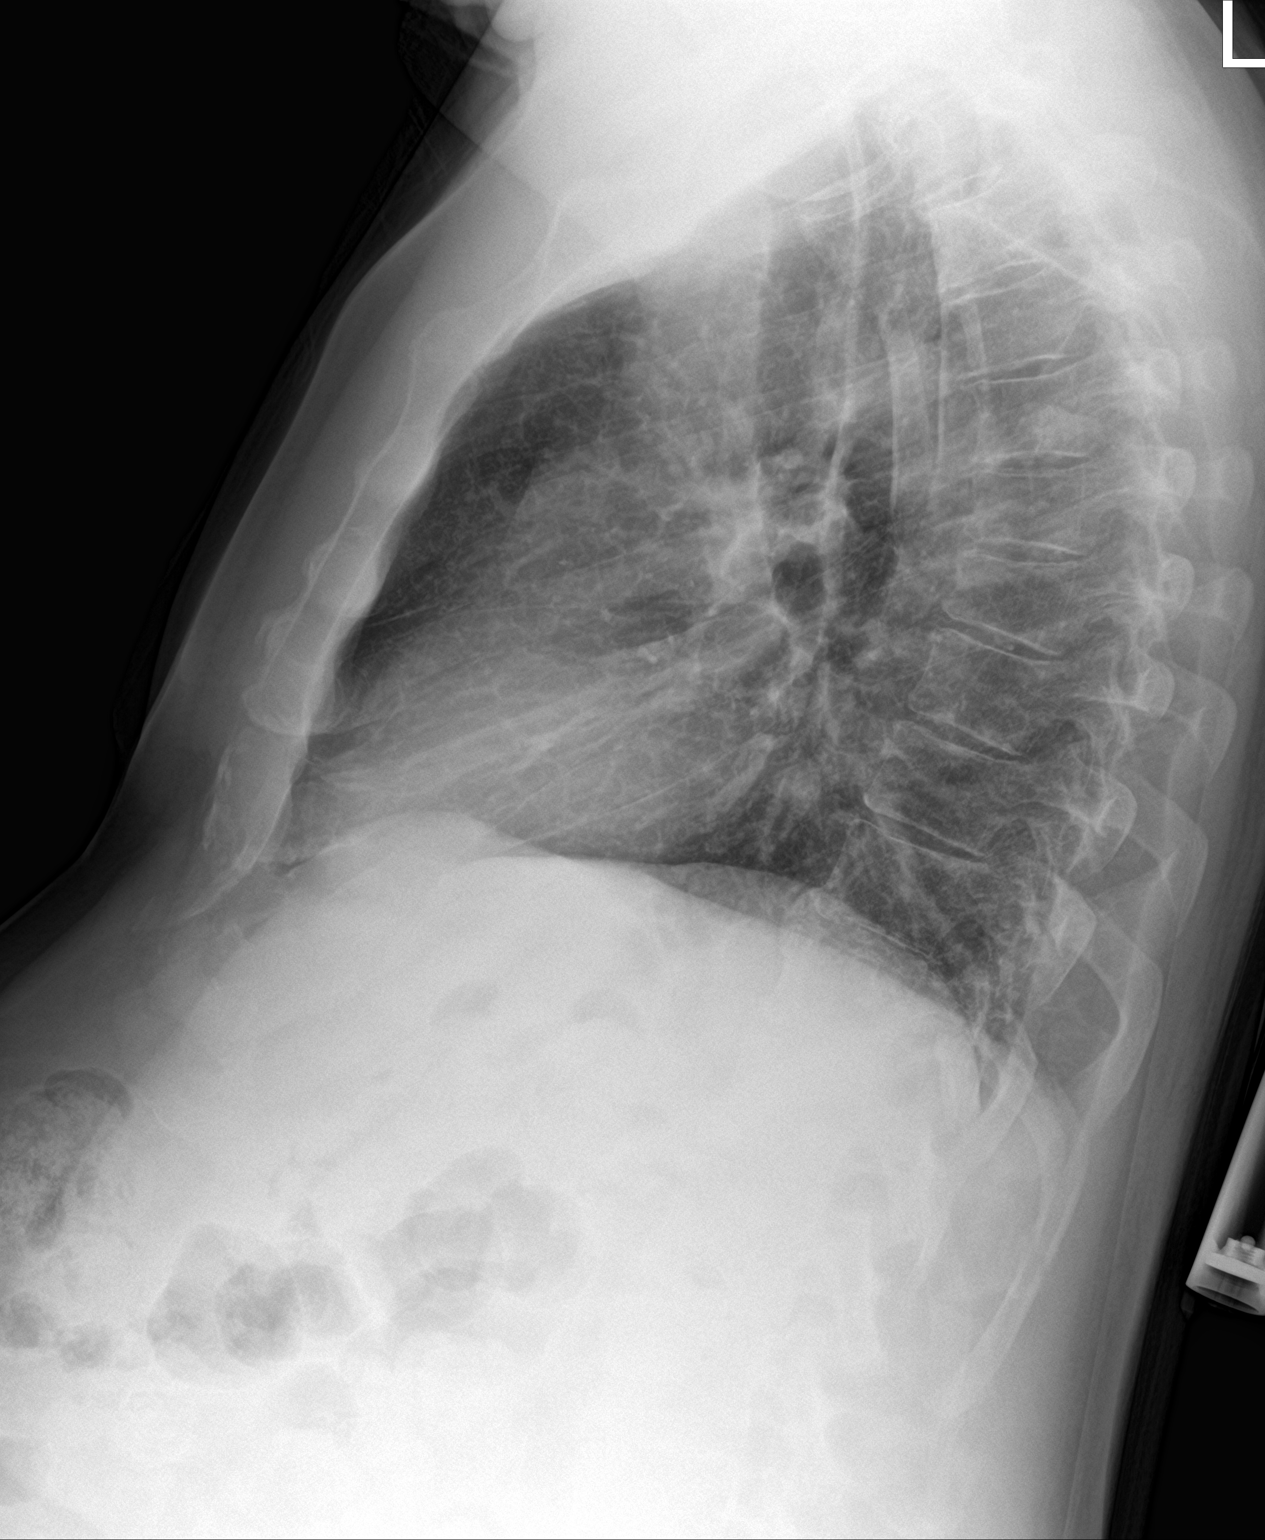

[chest ap]
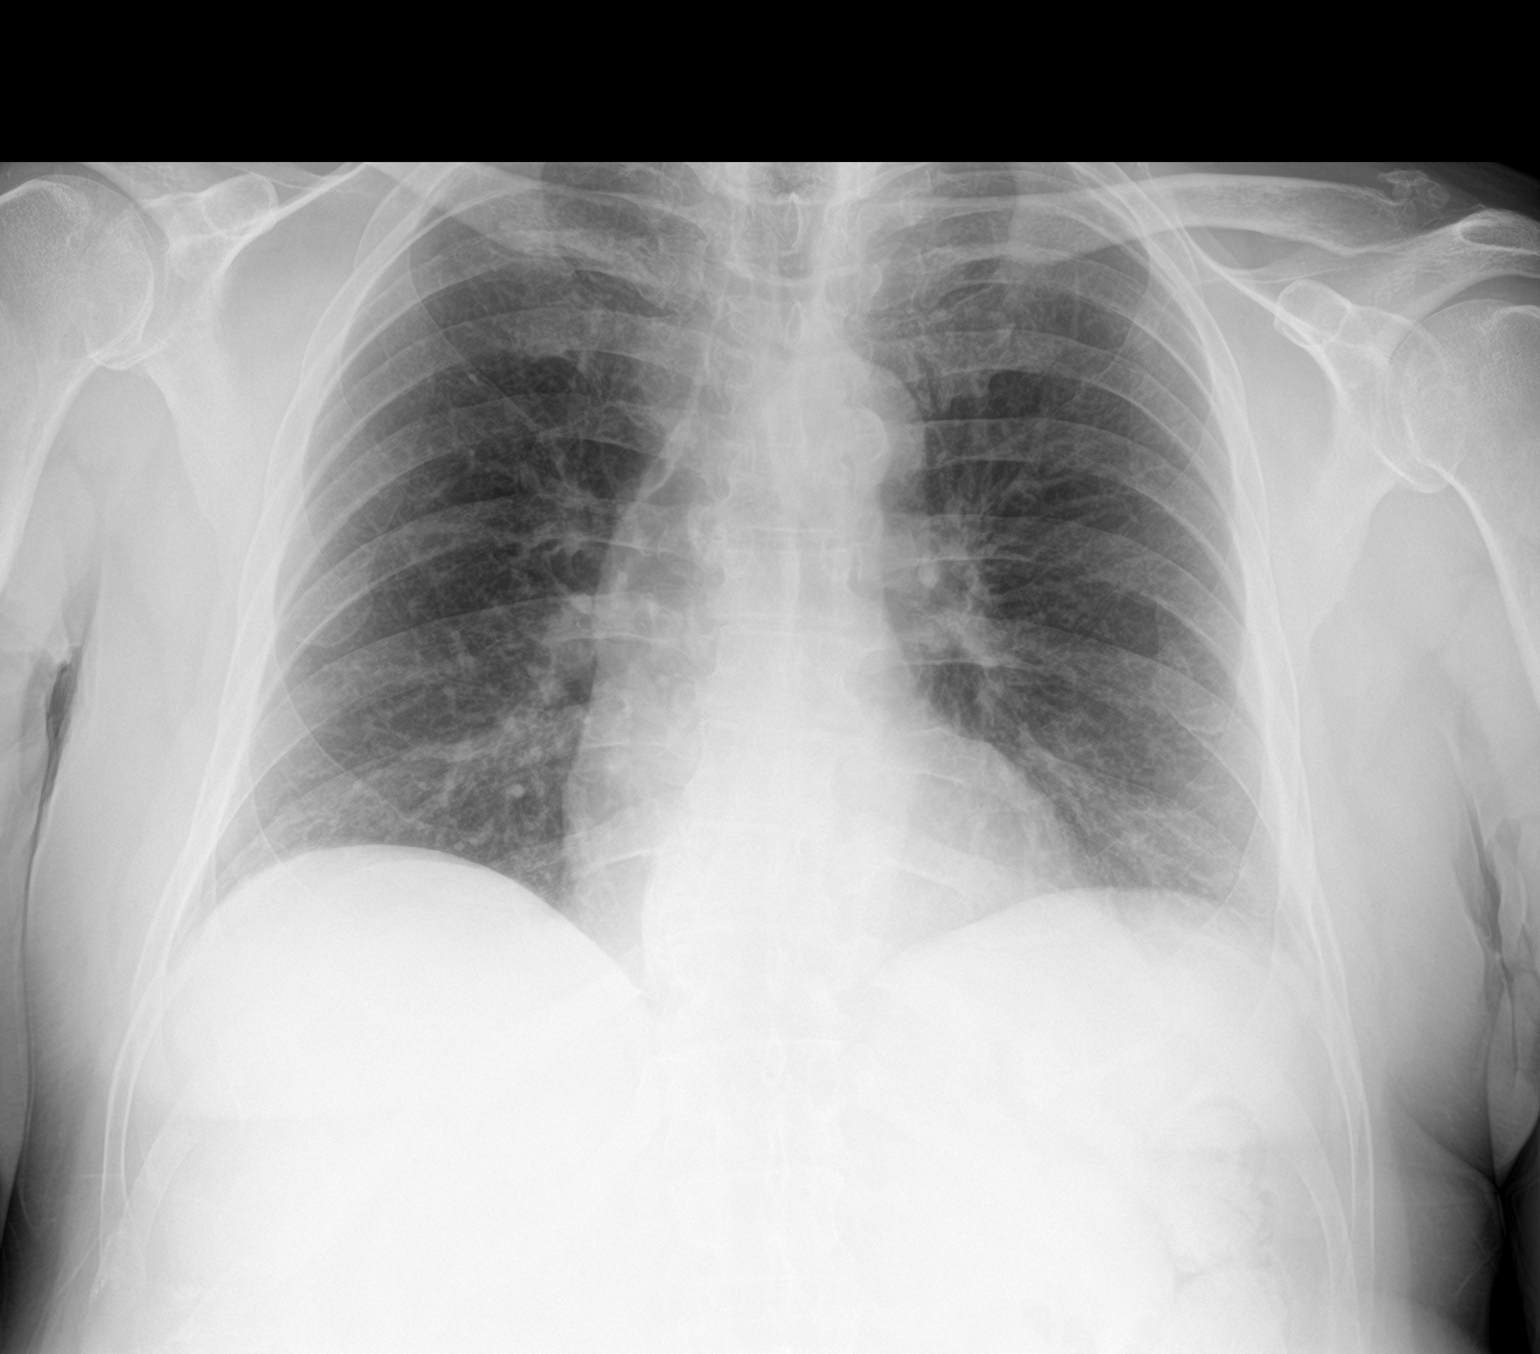

[2 of 2 positions shown; findings below may reference images not displayed]

FINDINGS: The cardiomediastinal silhouette is unchanged with normal heart
size. There is slight chronic interstitial coarsening. No confluent
airspace opacity, edema, pleural effusion, pneumothorax is
identified. No acute osseous abnormality is seen.
IMPRESSION: No active cardiopulmonary disease.

## 2021-05-17 ENCOUNTER — Other Ambulatory Visit: Payer: Self-pay

## 2021-05-21 ENCOUNTER — Encounter (HOSPITAL_COMMUNITY)
Admission: RE | Disposition: A | Discharge status: Home / Self Care | Payer: Self-pay | Source: Home / Self Care | Attending: Gastroenterology

## 2021-05-21 ENCOUNTER — Ambulatory Visit (HOSPITAL_COMMUNITY): Payer: Medicare Other | Admitting: Anesthesiology

## 2021-05-21 ENCOUNTER — Ambulatory Visit (HOSPITAL_COMMUNITY)
Admission: RE | Admit: 2021-05-21 | Discharge: 2021-05-21 | Disposition: A | Discharge status: Home / Self Care | Payer: Medicare Other | Attending: Gastroenterology | Admitting: Gastroenterology

## 2021-05-21 ENCOUNTER — Encounter (HOSPITAL_COMMUNITY): Payer: Self-pay | Admitting: Gastroenterology

## 2021-05-21 ENCOUNTER — Other Ambulatory Visit: Payer: Self-pay

## 2021-05-21 DIAGNOSIS — Z8619 Personal history of other infectious and parasitic diseases: Secondary | ICD-10-CM | POA: Diagnosis not present

## 2021-05-21 DIAGNOSIS — E785 Hyperlipidemia, unspecified: Secondary | ICD-10-CM | POA: Diagnosis not present

## 2021-05-21 DIAGNOSIS — Z79899 Other long term (current) drug therapy: Secondary | ICD-10-CM | POA: Diagnosis not present

## 2021-05-21 DIAGNOSIS — I10 Essential (primary) hypertension: Secondary | ICD-10-CM | POA: Diagnosis not present

## 2021-05-21 DIAGNOSIS — I8510 Secondary esophageal varices without bleeding: Secondary | ICD-10-CM | POA: Diagnosis not present

## 2021-05-21 DIAGNOSIS — K2970 Gastritis, unspecified, without bleeding: Secondary | ICD-10-CM | POA: Diagnosis not present

## 2021-05-21 DIAGNOSIS — I8500 Esophageal varices without bleeding: Secondary | ICD-10-CM | POA: Diagnosis not present

## 2021-05-21 DIAGNOSIS — K7030 Alcoholic cirrhosis of liver without ascites: Secondary | ICD-10-CM | POA: Diagnosis not present

## 2021-05-21 DIAGNOSIS — F17200 Nicotine dependence, unspecified, uncomplicated: Secondary | ICD-10-CM | POA: Insufficient documentation

## 2021-05-21 DIAGNOSIS — Z8673 Personal history of transient ischemic attack (TIA), and cerebral infarction without residual deficits: Secondary | ICD-10-CM | POA: Diagnosis not present

## 2021-05-21 DIAGNOSIS — K7460 Unspecified cirrhosis of liver: Secondary | ICD-10-CM | POA: Diagnosis not present

## 2021-05-21 HISTORY — PX: ESOPHAGOGASTRODUODENOSCOPY (EGD) WITH PROPOFOL: SHX5813

## 2021-05-21 SURGERY — ESOPHAGOGASTRODUODENOSCOPY (EGD) WITH PROPOFOL
Anesthesia: General

## 2021-05-21 MED ORDER — PROPOFOL 10 MG/ML IV BOLUS
INTRAVENOUS | Status: DC | PRN
  Administered 2021-05-21: 20 mg via INTRAVENOUS

## 2021-05-21 MED ORDER — PROPOFOL 500 MG/50ML IV EMUL
INTRAVENOUS | Status: AC
  Filled 2021-05-21: qty 50

## 2021-05-21 MED ORDER — OMEPRAZOLE MAGNESIUM 20 MG PO TBEC: 40.0000 mg | DELAYED_RELEASE_TABLET | Freq: Every day | ORAL | 3 refills | Status: AC

## 2021-05-21 MED ORDER — LACTATED RINGERS IV SOLN: INTRAVENOUS | Status: DC

## 2021-05-21 MED ORDER — PHENYLEPHRINE 40 MCG/ML (10ML) SYRINGE FOR IV PUSH (FOR BLOOD PRESSURE SUPPORT)
PREFILLED_SYRINGE | INTRAVENOUS | Status: DC | PRN
  Administered 2021-05-21: 120 ug via INTRAVENOUS
  Administered 2021-05-21 (×2): 80 ug via INTRAVENOUS

## 2021-05-21 MED ORDER — PROPOFOL 500 MG/50ML IV EMUL
INTRAVENOUS | Status: DC | PRN
  Administered 2021-05-21: 125 ug/kg/min via INTRAVENOUS

## 2021-05-21 MED ORDER — SODIUM CHLORIDE 0.9 % IV SOLN: INTRAVENOUS | Status: DC

## 2021-05-21 MED ORDER — LIDOCAINE 2% (20 MG/ML) 5 ML SYRINGE
INTRAMUSCULAR | Status: DC | PRN
  Administered 2021-05-21: 100 mg via INTRAVENOUS

## 2021-05-21 MED ORDER — LACTATED RINGERS IV SOLN
INTRAVENOUS | Status: AC | PRN
  Administered 2021-05-21: 10 mL/h via INTRAVENOUS

## 2021-05-21 SURGICAL SUPPLY — 14 items

## 2021-05-21 NOTE — Anesthesia Preprocedure Evaluation (Addendum)
Anesthesia Evaluation  Patient identified by MRN, date of birth, ID band Patient awake    Reviewed: Allergy & Precautions, NPO status , Patient's Chart, lab work & pertinent test results  Airway Mallampati: III  TM Distance: >3 FB Neck ROM: Full    Dental  (+) Edentulous Upper, Missing   Pulmonary Current SmokerPatient did not abstain from smoking.,    Pulmonary exam normal breath sounds clear to auscultation       Cardiovascular hypertension, Pt. on medications and Pt. on home beta blockers Normal cardiovascular exam Rhythm:Regular Rate:Normal  ECG: NSR, rate 99   Neuro/Psych PSYCHIATRIC DISORDERS TIACVA, No Residual Symptoms    GI/Hepatic negative GI ROS, (+) Cirrhosis     substance abuse  , Hepatitis -, C  Endo/Other  negative endocrine ROS  Renal/GU negative Renal ROS     Musculoskeletal  (+) narcotic dependentGout Ambulates with a cane   Abdominal   Peds  Hematology  (+) anemia ,   Anesthesia Other Findings Esophageal varices  Reproductive/Obstetrics                             Anesthesia Physical Anesthesia Plan  ASA: 4  Anesthesia Plan: General   Post-op Pain Management:    Induction: Intravenous  PONV Risk Score and Plan: 1 and Propofol infusion and Treatment may vary due to age or medical condition  Airway Management Planned: Nasal Cannula  Additional Equipment:   Intra-op Plan:   Post-operative Plan:   Informed Consent: I have reviewed the patients History and Physical, chart, labs and discussed the procedure including the risks, benefits and alternatives for the proposed anesthesia with the patient or authorized representative who has indicated his/her understanding and acceptance.     Dental advisory given  Plan Discussed with: CRNA  Anesthesia Plan Comments:        Anesthesia Quick Evaluation

## 2021-05-21 NOTE — Discharge Instructions (Signed)

## 2021-05-21 NOTE — Op Note (Signed)
[facility] Patient Name: Calvin [name] Procedure Date: 05/21/2021 MRN: [id number] Attending MD: Otis Brace , MD Date of Birth: [birth year] CSN: [id number] Age: 60 Admit Type: Outpatient Procedure:                Upper GI endoscopy Indications:              Follow-up of esophageal varices, For therapy of                            esophageal varices Providers:                Otis Brace, MD, Kary Kos RN, RN, Cherylynn Ridges, Technician, Danley Danker, CRNA Referring MD:              Medicines:                Sedation Administered by an Anesthesia Professional Complications:            No immediate complications. Estimated Blood Loss:     Estimated blood loss was minimal. Procedure:                Pre-Anesthesia Assessment:                           - Prior to the procedure, a History and Physical                            was performed, and patient medications and                            allergies were reviewed. The patient's tolerance of                            previous anesthesia was also reviewed. The risks                            and benefits of the procedure and the sedation                            options and risks were discussed with the patient.                            All questions were answered, and informed consent                            was obtained. Prior Anticoagulants: The patient has                            taken no previous anticoagulant or antiplatelet                            agents. ASA Grade Assessment: IV - A patient with  severe systemic disease that is a constant threat                            to life. After reviewing the risks and benefits,                            the patient was deemed in satisfactory condition to                            undergo the procedure.                           After obtaining informed consent, the endoscope was                             passed under direct vision. Throughout the                            procedure, the patient's blood pressure, pulse, and                            oxygen saturations were monitored continuously. The                            [id number] Olympus endoscope was introduced                            through the mouth, and advanced to the second part                            of duodenum. The upper GI endoscopy was                            accomplished without difficulty. The patient                            tolerated the procedure well. Scope In: Scope Out: Findings:      Grade I, small (< 5 mm) varices with no bleeding and no stigmata of       recent bleeding were found in the distal esophagus,. No red wale signs       were present. Scarring from prior treatment was visible. The varices       appeared smaller than they were at prior exam.      Scattered moderate inflammation characterized by congestion (edema),       erosions and erythema was found in the gastric antrum and in the       prepyloric region of the stomach.      The cardia and gastric fundus were normal on retroflexion.      The duodenal bulb, first portion of the duodenum and second portion of       the duodenum were normal. Impression:               - Grade I and small (< 5 mm) esophageal varices  with no bleeding and no stigmata of recent bleeding.                           - Gastritis.                           - Normal duodenal bulb, first portion of the                            duodenum and second portion of the duodenum.                           - No specimens collected. Moderate Sedation:      Moderate (conscious) sedation was personally administered by an       anesthesia professional. The following parameters were monitored: oxygen       saturation, heart rate, blood pressure, and response to care. Recommendation:           - Patient has a contact number  available for                            emergencies. The signs and symptoms of potential                            delayed complications were discussed with the                            patient. Return to normal activities tomorrow.                            Written discharge instructions were provided to the                            patient.                           - Resume previous diet.                           - Continue present medications.                           - Repeat upper endoscopy in 1 year for surveillance.                           - Use Prilosec (omeprazole) 40 mg PO daily. Procedure Code(s):        --- Professional ---                           [phone], Esophagogastroduodenoscopy, flexible,                            transoral; diagnostic, including collection of                            specimen(s) by brushing or washing, when performed                            (  separate procedure) Diagnosis Code(s):        --- Professional ---                           I85.00, Esophageal varices without bleeding                           K29.70, Gastritis, unspecified, without bleeding CPT copyright 2019 American Medical Association. All rights reserved. The codes documented in this report are preliminary and upon coder review may  be revised to meet current compliance requirements. Otis Brace, MD Otis Brace, MD 05/21/2021 9:02:23 AM Number of Addenda: 0

## 2021-05-21 NOTE — H&P (Signed)
Primary Care Physician:  Jenny Reichmann, PA-C Primary Gastroenterologist:  Dr. Alessandra Bevels  Reason for Visit : EGD for esophageal varices  HPI: Calvin [name] is a 60 y.o. male here for outpatient EGD for variceal band ligation.  Please see previous GI history for detail.  Underwent EGD at [facility] outpatient  endoscopy center in March 2022 which showed large esophageal varices and gastritis.  Biopsies were negative for H. Pylori.  He was started on nadolol.  Underwent EGD in May 2022 which again showed large esophageal varices and 2 bands were placed.  EGD also showed gastritis.    Denies any acute GI issues. Denies blood in the stool or black stool. Denies diarrhea or constipation. Denied abdominal pain. Denies reflux, trouble swallowing her pain while swallowing. Overall feeling better from GI standpoint.  He is not taking Nadolol at this time.   Previous GI history  past medical history of hepatitis C and alcoholic cirrhosis. Please see previous GI history for detail. History of hepatitis C - status post treatment with Epclusa which he completed in April 2021. Was last in office in June 2021. Negative viral load in June 2021. Blood work in June 2021 showed elevated creatinine and was advised to stop diuretics. He was also found to mild anemia with hemoglobin of around 10.7. Repeat blood work in August 2021 showed improvement in creatinine to around 1.17. She was subsequently admitted to the hospital in November 2021 with acute kidney injury with creatinine of around 8 and encephalopathy. He was also diagnosed with Escherichia coli bacteremia.        Ultrasound right upper quadrant January 2022 showed cirrhosis and cholelithiasis. No acute changes.        He quit alcohol in December 2021.          Ultrasound in May 2021 showed cirrhosis and mild thickening of the gallbladder. Follow-up HIDA scan was normal.  Past Medical History:  Diagnosis Date   Ascites    Chronic left hip pain     Cirrhosis ([place])    Hypertension    Stroke [facility])    TIA (transient ischemic attack) 06/23/2016    Past Surgical History:  Procedure Laterality Date   BACK SURGERY     CERVICAL SPINE SURGERY     ESOPHAGEAL BANDING N/A 01/25/2021   Procedure: ESOPHAGEAL BANDING;  Surgeon: Otis Brace, MD;  Location: [facility];  Service: Gastroenterology;  Laterality: N/A;   ESOPHAGOGASTRODUODENOSCOPY (EGD) WITH PROPOFOL N/A 01/25/2021   Procedure: ESOPHAGOGASTRODUODENOSCOPY (EGD) WITH PROPOFOL;  Surgeon: Otis Brace, MD;  Location: [facility];  Service: Gastroenterology;  Laterality: N/A;   JOINT REPLACEMENT      Prior to Admission medications   Medication Sig Start Date End Date Taking? Authorizing Provider  allopurinol (ZYLOPRIM) 300 MG tablet Take 300 mg by mouth daily. 10/22/20  Yes [provider]  amLODipine (NORVASC) 5 MG tablet Take 5 mg by mouth daily. 10/10/20  Yes [provider]  folic acid (FOLVITE) 1 MG tablet Take 1 mg by mouth daily. 02/14/20  Yes [provider]  furosemide (LASIX) 20 MG tablet Take 20 mg by mouth daily. 02/14/20  Yes [provider]  lactulose (CHRONULAC) 10 GM/15ML solution Take 30 mLs (20 g total) by mouth 2 (two) times daily. 04/07/20  Yes Elgergawy, Silver Huguenin, MD  nadolol (CORGARD) 20 MG tablet Take 20 mg by mouth daily. 01/09/21  Yes [provider]  Oxycodone HCl 10 MG TABS Take 10 mg by mouth 5 (five) times daily  as needed (pain).   Yes [provider]  propranolol (INDERAL) 10 MG tablet Take 1 tablet (10 mg total) by mouth 2 (two) times daily. 04/07/20  Yes Elgergawy, Silver Huguenin, MD  tiZANidine (ZANAFLEX) 4 MG tablet Take 4 mg by mouth 3 (three) times daily as needed for muscle spasms. 11/17/20  Yes [provider]  Vitamin D, Ergocalciferol, (DRISDOL) 1.25 MG (50000 UNIT) CAPS capsule Take 50,000 Units by mouth every Sunday. 03/17/20  Yes [provider]  nicotine (NICODERM CQ - DOSED IN  MG/24 HOURS) 21 mg/24hr patch Place 1 patch (21 mg total) onto the skin daily. Patient not taking: Reported on 01/17/2021 04/08/20   Elgergawy, Silver Huguenin, MD  QUEtiapine (SEROQUEL) 25 MG tablet Take 1 tablet (25 mg total) by mouth at bedtime as needed (psychosis, confusion, agitaiton). 08/09/20 09/08/20  Terrilee Croak, MD  thiamine 100 MG tablet Take 1 tablet (100 mg total) by mouth daily. Patient not taking: Reported on 01/17/2021 04/08/20   Elgergawy, Silver Huguenin, MD    Scheduled Meds: Continuous Infusions:  sodium chloride     lactated ringers     PRN Meds:.  Allergies as of 02/01/2021   (No Known Allergies)    Family History  Family history unknown: Yes    Social History   Socioeconomic History   Marital status: Married    Spouse name: Not on file   Number of children: Not on file   Years of education: Not on file   Highest education level: Not on file  Occupational History   Not on file  Tobacco Use   Smoking status: Every Day    Types: Cigarettes   Smokeless tobacco: Never   Tobacco comments:    1/2 pack per day  Substance and Sexual Activity   Alcohol use: Yes    Comment: 2 beers/day   Drug use: No   Sexual activity: Not on file  Other Topics Concern   Not on file  Social History Narrative   Not on file   Social Determinants of Health   Financial Resource Strain: Not on file  Food Insecurity: Not on file  Transportation Needs: Not on file  Physical Activity: Not on file  Stress: Not on file  Social Connections: Not on file  Intimate Partner Violence: Not on file     Physical Exam: Vital signs: Vitals:   05/21/21 0747  BP: (!) 143/78  Pulse: 85  Resp: 10  Temp: 97.9 F (36.6 C)  SpO2: 95%      General:   Alert,  Well-developed, well-nourished, pleasant and cooperative in NAD Lungs:  Clear throughout to auscultation.   No wheezes, crackles, or rhonchi. No acute distress. Heart:  Regular rate and rhythm; no murmurs, clicks, rubs,  or  gallops. Abdomen: Soft, nontender, nondistended, bowel sounds present.  No peritoneal signs Rectal:  Deferred  GI:  Lab Results: No results for input(s): WBC, HGB, HCT, PLT in the last 72 hours. BMET No results for input(s): NA, K, CL, CO2, GLUCOSE, BUN, CREATININE, CALCIUM in the last 72 hours. LFT No results for input(s): PROT, ALBUMIN, AST, ALT, ALKPHOS, BILITOT, BILIDIR, IBILI in the last 72 hours. PT/INR No results for input(s): LABPROT, INR in the last 72 hours.   Studies/Results: No results found.  Impression/Plan: -Esophageal varices s/p band ligation in May 2022 -Cirrhosis with prior history of hepatitis C and alcohol use. -History of hepatitis C.  Status posttreatment with Epclusa   Recommendations ---------------------- -Proceed with EGD for band ligation. -  Patient was prescribed nadolol by me and was also taking propanolol prescribed by primary care physician.  Looks like he is not taking nadolol at this time.  Recommend to continue propranolol  Risks (bleeding, infection, bowel perforation that could require surgery, sedation-related changes in cardiopulmonary systems), benefits (identification and possible treatment of source of symptoms, exclusion of certain causes of symptoms), and alternatives (watchful waiting, radiographic imaging studies, empiric medical treatment)  were explained to patient/family in detail and patient wishes to proceed.    LOS: 0 days   Otis Brace  MD, FACP 05/21/2021, 8:01 AM  Contact #  [phone]

## 2021-05-21 NOTE — Anesthesia Postprocedure Evaluation (Signed)
Anesthesia Post Note  Patient: Cristian Davitt Kist  Procedure(s) Performed: ESOPHAGOGASTRODUODENOSCOPY (EGD) WITH PROPOFOL     Patient location during evaluation: Endoscopy Anesthesia Type: MAC Level of consciousness: awake Pain management: pain level controlled Vital Signs Assessment: post-procedure vital signs reviewed and stable Respiratory status: spontaneous breathing, nonlabored ventilation and respiratory function stable Cardiovascular status: stable and blood pressure returned to baseline Postop Assessment: no apparent nausea or vomiting Anesthetic complications: no   No notable events documented.  Last Vitals:  Vitals:   05/21/21 0910 05/21/21 0920  BP: 114/74 118/73  Pulse: 77 77  Resp: (!) 21 (!) 22  Temp:    SpO2: 94% 92%    Last Pain:  Vitals:   05/21/21 0920  TempSrc:   PainSc: 0-No pain                 Exavior Kimmons P Dozier Berkovich

## 2021-05-21 NOTE — Transfer of Care (Signed)
Immediate Anesthesia Transfer of Care Note  Patient: Calvin [name]  Procedure(s) Performed: ESOPHAGOGASTRODUODENOSCOPY (EGD) WITH PROPOFOL ESOPHAGEAL BANDING  Patient Location: Endoscopy Unit  Anesthesia Type:MAC  Level of Consciousness: awake, alert  and oriented  Airway & Oxygen Therapy: Patient Spontanous Breathing and Patient connected to face mask oxygen  Post-op Assessment: Report given to RN and Post -op Vital signs reviewed and stable  Post vital signs: Reviewed and stable  Last Vitals:  Vitals Value Taken Time  BP 111/68 05/21/21 0900  Temp    Pulse 77 05/21/21 0901  Resp 22 05/21/21 0901  SpO2 100 % 05/21/21 0901  Vitals shown include unvalidated device data.  Last Pain:  Vitals:   05/21/21 0747  TempSrc: Oral  PainSc: 0-No pain         Complications: No notable events documented.

## 2021-05-23 ENCOUNTER — Other Ambulatory Visit: Payer: Self-pay | Admitting: Gastroenterology

## 2021-05-23 DIAGNOSIS — K7030 Alcoholic cirrhosis of liver without ascites: Secondary | ICD-10-CM

## 2021-05-24 ENCOUNTER — Encounter (HOSPITAL_COMMUNITY): Payer: Self-pay | Admitting: Gastroenterology

## 2021-06-04 ENCOUNTER — Ambulatory Visit
Admission: RE | Admit: 2021-06-04 | Discharge: 2021-06-04 | Disposition: A | Discharge status: Home / Self Care | Payer: Medicare Other | Source: Ambulatory Visit | Attending: Gastroenterology | Admitting: Gastroenterology

## 2021-06-04 DIAGNOSIS — K7030 Alcoholic cirrhosis of liver without ascites: Secondary | ICD-10-CM

## 2021-06-04 DIAGNOSIS — K8020 Calculus of gallbladder without cholecystitis without obstruction: Secondary | ICD-10-CM | POA: Diagnosis not present

## 2021-06-11 DIAGNOSIS — M25511 Pain in right shoulder: Secondary | ICD-10-CM | POA: Diagnosis not present

## 2021-06-13 DIAGNOSIS — M5416 Radiculopathy, lumbar region: Secondary | ICD-10-CM | POA: Diagnosis not present

## 2021-06-13 DIAGNOSIS — G8929 Other chronic pain: Secondary | ICD-10-CM | POA: Diagnosis not present

## 2021-06-13 DIAGNOSIS — I10 Essential (primary) hypertension: Secondary | ICD-10-CM | POA: Diagnosis not present

## 2021-06-13 DIAGNOSIS — M25552 Pain in left hip: Secondary | ICD-10-CM | POA: Diagnosis not present

## 2021-06-13 DIAGNOSIS — Z79899 Other long term (current) drug therapy: Secondary | ICD-10-CM | POA: Diagnosis not present

## 2021-06-13 DIAGNOSIS — F17210 Nicotine dependence, cigarettes, uncomplicated: Secondary | ICD-10-CM | POA: Diagnosis not present

## 2021-06-13 DIAGNOSIS — M25562 Pain in left knee: Secondary | ICD-10-CM | POA: Diagnosis not present

## 2021-07-01 DIAGNOSIS — E785 Hyperlipidemia, unspecified: Secondary | ICD-10-CM | POA: Diagnosis not present

## 2021-07-01 DIAGNOSIS — D649 Anemia, unspecified: Secondary | ICD-10-CM | POA: Diagnosis not present

## 2021-07-01 DIAGNOSIS — G8929 Other chronic pain: Secondary | ICD-10-CM | POA: Diagnosis not present

## 2021-07-01 DIAGNOSIS — I10 Essential (primary) hypertension: Secondary | ICD-10-CM | POA: Diagnosis not present

## 2021-07-11 DIAGNOSIS — E559 Vitamin D deficiency, unspecified: Secondary | ICD-10-CM | POA: Diagnosis not present

## 2021-07-11 DIAGNOSIS — M25511 Pain in right shoulder: Secondary | ICD-10-CM | POA: Diagnosis not present

## 2021-07-11 DIAGNOSIS — K7460 Unspecified cirrhosis of liver: Secondary | ICD-10-CM | POA: Diagnosis not present

## 2021-07-11 DIAGNOSIS — I10 Essential (primary) hypertension: Secondary | ICD-10-CM | POA: Diagnosis not present

## 2021-07-11 DIAGNOSIS — D649 Anemia, unspecified: Secondary | ICD-10-CM | POA: Diagnosis not present

## 2021-07-11 DIAGNOSIS — I8500 Esophageal varices without bleeding: Secondary | ICD-10-CM | POA: Diagnosis not present

## 2021-07-11 DIAGNOSIS — E785 Hyperlipidemia, unspecified: Secondary | ICD-10-CM | POA: Diagnosis not present

## 2021-07-11 DIAGNOSIS — I700 Atherosclerosis of aorta: Secondary | ICD-10-CM | POA: Diagnosis not present

## 2021-07-11 DIAGNOSIS — E538 Deficiency of other specified B group vitamins: Secondary | ICD-10-CM | POA: Diagnosis not present

## 2021-07-11 DIAGNOSIS — E440 Moderate protein-calorie malnutrition: Secondary | ICD-10-CM | POA: Diagnosis not present

## 2021-07-12 DIAGNOSIS — R030 Elevated blood-pressure reading, without diagnosis of hypertension: Secondary | ICD-10-CM | POA: Diagnosis not present

## 2021-07-12 DIAGNOSIS — F17210 Nicotine dependence, cigarettes, uncomplicated: Secondary | ICD-10-CM | POA: Diagnosis not present

## 2021-07-12 DIAGNOSIS — Z96642 Presence of left artificial hip joint: Secondary | ICD-10-CM | POA: Diagnosis not present

## 2021-07-12 DIAGNOSIS — Z79899 Other long term (current) drug therapy: Secondary | ICD-10-CM | POA: Diagnosis not present

## 2021-07-12 DIAGNOSIS — M5416 Radiculopathy, lumbar region: Secondary | ICD-10-CM | POA: Diagnosis not present

## 2021-07-12 DIAGNOSIS — F17200 Nicotine dependence, unspecified, uncomplicated: Secondary | ICD-10-CM | POA: Diagnosis not present

## 2021-07-12 DIAGNOSIS — Z0000 Encounter for general adult medical examination without abnormal findings: Secondary | ICD-10-CM | POA: Diagnosis not present

## 2021-07-12 DIAGNOSIS — G8929 Other chronic pain: Secondary | ICD-10-CM | POA: Diagnosis not present

## 2021-07-12 DIAGNOSIS — M25552 Pain in left hip: Secondary | ICD-10-CM | POA: Diagnosis not present

## 2021-08-02 DIAGNOSIS — M25511 Pain in right shoulder: Secondary | ICD-10-CM | POA: Diagnosis not present

## 2021-08-12 DIAGNOSIS — F17210 Nicotine dependence, cigarettes, uncomplicated: Secondary | ICD-10-CM | POA: Diagnosis not present

## 2021-08-12 DIAGNOSIS — G8929 Other chronic pain: Secondary | ICD-10-CM | POA: Diagnosis not present

## 2021-08-12 DIAGNOSIS — R030 Elevated blood-pressure reading, without diagnosis of hypertension: Secondary | ICD-10-CM | POA: Diagnosis not present

## 2021-08-12 DIAGNOSIS — M25562 Pain in left knee: Secondary | ICD-10-CM | POA: Diagnosis not present

## 2021-08-12 DIAGNOSIS — Z79899 Other long term (current) drug therapy: Secondary | ICD-10-CM | POA: Diagnosis not present

## 2021-08-12 DIAGNOSIS — M5416 Radiculopathy, lumbar region: Secondary | ICD-10-CM | POA: Diagnosis not present

## 2021-08-12 DIAGNOSIS — M25552 Pain in left hip: Secondary | ICD-10-CM | POA: Diagnosis not present

## 2021-08-12 DIAGNOSIS — F17200 Nicotine dependence, unspecified, uncomplicated: Secondary | ICD-10-CM | POA: Diagnosis not present

## 2021-09-05 DIAGNOSIS — I8500 Esophageal varices without bleeding: Secondary | ICD-10-CM | POA: Diagnosis not present

## 2021-09-05 DIAGNOSIS — K7290 Hepatic failure, unspecified without coma: Secondary | ICD-10-CM | POA: Diagnosis not present

## 2021-09-09 IMAGING — US IR ABDOMEN US LIMITED
1 series · 3 of 3 positions shown · non-contrast
Comparison: 07/27/2020

CLINICAL DATA: Abdominal distension, assess for ascites

EXAM:
LIMITED ABDOMEN ULTRASOUND FOR ASCITES
TECHNIQUE: Limited ultrasound survey for ascites was performed in all four
abdominal quadrants.

[Series 1: ir (id) (id)/(id)/(id) ir · 3 of 3 slices shown]
[im 1/3]
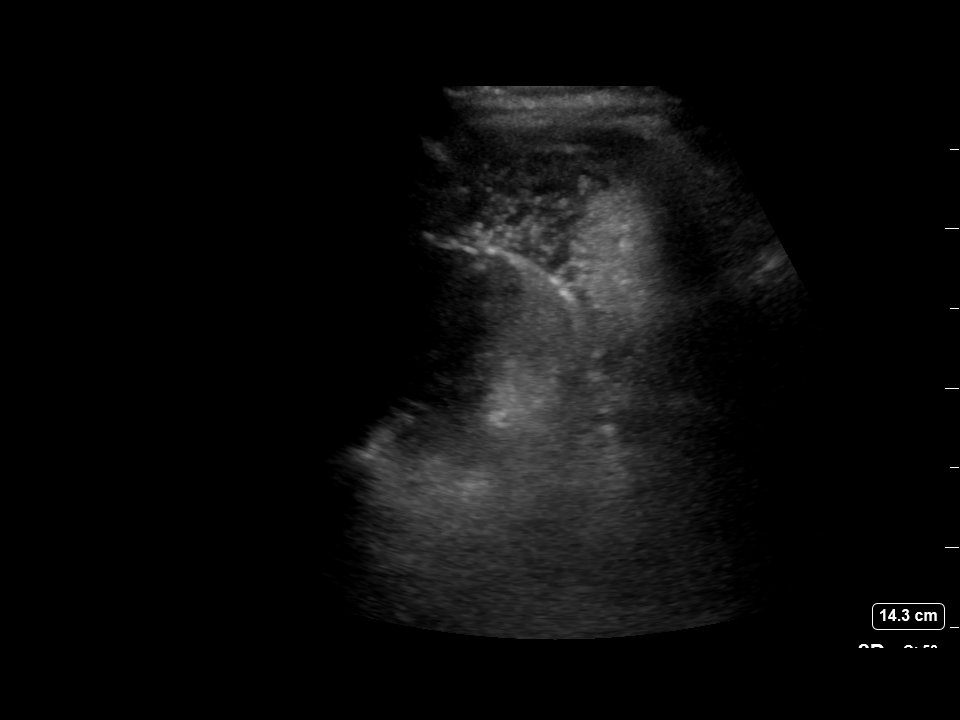
[im 2/3]
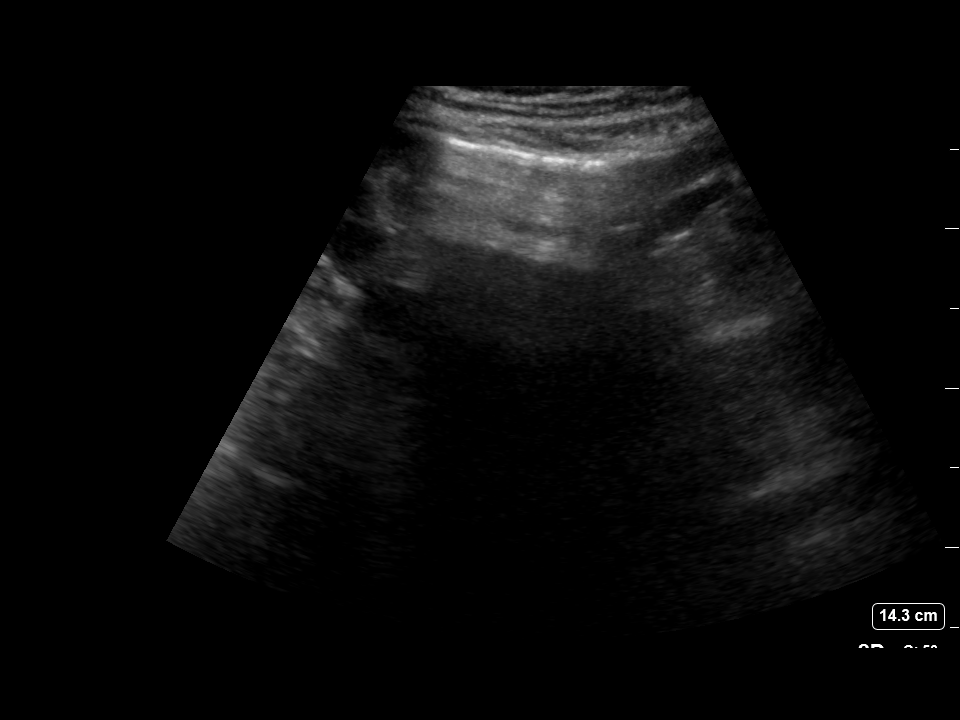
[im 3/3]
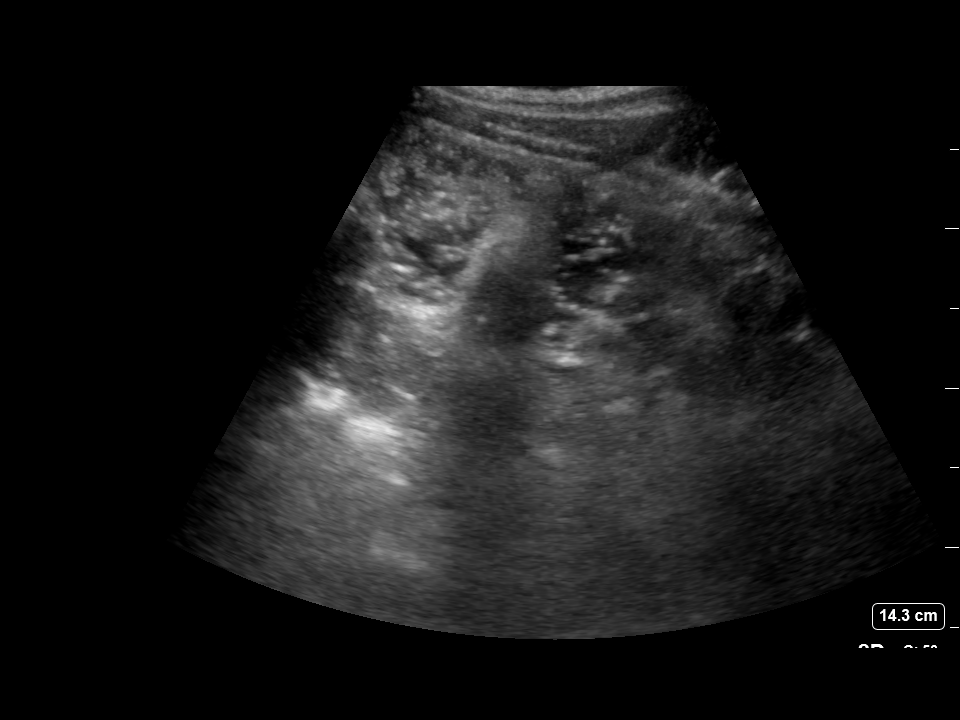

[3 of 3 positions shown; findings below may reference images not displayed]

FINDINGS: Survey of the abdominal 4 quadrants demonstrates no significant free
fluid or ascites that warrants paracentesis. Procedure not
performed.
IMPRESSION: No significant abdominopelvic ascites by ultrasound

## 2021-09-10 DIAGNOSIS — M25511 Pain in right shoulder: Secondary | ICD-10-CM | POA: Diagnosis not present

## 2021-09-11 DIAGNOSIS — F17210 Nicotine dependence, cigarettes, uncomplicated: Secondary | ICD-10-CM | POA: Diagnosis not present

## 2021-09-11 DIAGNOSIS — E559 Vitamin D deficiency, unspecified: Secondary | ICD-10-CM | POA: Diagnosis not present

## 2021-09-11 DIAGNOSIS — F17200 Nicotine dependence, unspecified, uncomplicated: Secondary | ICD-10-CM | POA: Diagnosis not present

## 2021-09-11 DIAGNOSIS — M25552 Pain in left hip: Secondary | ICD-10-CM | POA: Diagnosis not present

## 2021-09-11 DIAGNOSIS — M5416 Radiculopathy, lumbar region: Secondary | ICD-10-CM | POA: Diagnosis not present

## 2021-09-11 DIAGNOSIS — Z79899 Other long term (current) drug therapy: Secondary | ICD-10-CM | POA: Diagnosis not present

## 2021-09-11 DIAGNOSIS — M25562 Pain in left knee: Secondary | ICD-10-CM | POA: Diagnosis not present

## 2021-09-11 DIAGNOSIS — Z1159 Encounter for screening for other viral diseases: Secondary | ICD-10-CM | POA: Diagnosis not present

## 2021-09-11 DIAGNOSIS — M109 Gout, unspecified: Secondary | ICD-10-CM | POA: Diagnosis not present

## 2021-09-11 DIAGNOSIS — R030 Elevated blood-pressure reading, without diagnosis of hypertension: Secondary | ICD-10-CM | POA: Diagnosis not present

## 2021-09-11 DIAGNOSIS — G8929 Other chronic pain: Secondary | ICD-10-CM | POA: Diagnosis not present

## 2021-09-30 DIAGNOSIS — R69 Illness, unspecified: Secondary | ICD-10-CM | POA: Diagnosis not present

## 2021-10-01 DIAGNOSIS — R899 Unspecified abnormal finding in specimens from other organs, systems and tissues: Secondary | ICD-10-CM | POA: Diagnosis not present

## 2021-10-01 DIAGNOSIS — R69 Illness, unspecified: Secondary | ICD-10-CM | POA: Diagnosis not present

## 2021-10-08 DIAGNOSIS — R69 Illness, unspecified: Secondary | ICD-10-CM | POA: Diagnosis not present

## 2021-10-11 DIAGNOSIS — M25511 Pain in right shoulder: Secondary | ICD-10-CM | POA: Diagnosis not present

## 2021-10-17 DIAGNOSIS — F17210 Nicotine dependence, cigarettes, uncomplicated: Secondary | ICD-10-CM | POA: Diagnosis not present

## 2021-10-17 DIAGNOSIS — M5416 Radiculopathy, lumbar region: Secondary | ICD-10-CM | POA: Diagnosis not present

## 2021-10-17 DIAGNOSIS — R69 Illness, unspecified: Secondary | ICD-10-CM | POA: Diagnosis not present

## 2021-10-17 DIAGNOSIS — M25552 Pain in left hip: Secondary | ICD-10-CM | POA: Diagnosis not present

## 2021-10-17 DIAGNOSIS — F17200 Nicotine dependence, unspecified, uncomplicated: Secondary | ICD-10-CM | POA: Diagnosis not present

## 2021-10-17 DIAGNOSIS — M25562 Pain in left knee: Secondary | ICD-10-CM | POA: Diagnosis not present

## 2021-10-17 DIAGNOSIS — Z79899 Other long term (current) drug therapy: Secondary | ICD-10-CM | POA: Diagnosis not present

## 2021-10-17 DIAGNOSIS — G8929 Other chronic pain: Secondary | ICD-10-CM | POA: Diagnosis not present

## 2021-10-17 DIAGNOSIS — R030 Elevated blood-pressure reading, without diagnosis of hypertension: Secondary | ICD-10-CM | POA: Diagnosis not present

## 2021-10-21 DIAGNOSIS — Z79899 Other long term (current) drug therapy: Secondary | ICD-10-CM | POA: Diagnosis not present

## 2021-11-11 DIAGNOSIS — M25511 Pain in right shoulder: Secondary | ICD-10-CM | POA: Diagnosis not present

## 2021-11-11 DIAGNOSIS — F17200 Nicotine dependence, unspecified, uncomplicated: Secondary | ICD-10-CM | POA: Diagnosis not present

## 2021-11-11 DIAGNOSIS — M5416 Radiculopathy, lumbar region: Secondary | ICD-10-CM | POA: Diagnosis not present

## 2021-11-11 DIAGNOSIS — Z79899 Other long term (current) drug therapy: Secondary | ICD-10-CM | POA: Diagnosis not present

## 2021-11-11 DIAGNOSIS — N3090 Cystitis, unspecified without hematuria: Secondary | ICD-10-CM | POA: Diagnosis not present

## 2021-11-11 DIAGNOSIS — R030 Elevated blood-pressure reading, without diagnosis of hypertension: Secondary | ICD-10-CM | POA: Diagnosis not present

## 2021-11-11 DIAGNOSIS — F17210 Nicotine dependence, cigarettes, uncomplicated: Secondary | ICD-10-CM | POA: Diagnosis not present

## 2021-11-11 DIAGNOSIS — M25562 Pain in left knee: Secondary | ICD-10-CM | POA: Diagnosis not present

## 2021-11-11 DIAGNOSIS — M25552 Pain in left hip: Secondary | ICD-10-CM | POA: Diagnosis not present

## 2021-11-11 DIAGNOSIS — G8929 Other chronic pain: Secondary | ICD-10-CM | POA: Diagnosis not present

## 2021-11-15 DIAGNOSIS — Z79899 Other long term (current) drug therapy: Secondary | ICD-10-CM | POA: Diagnosis not present

## 2021-11-19 DIAGNOSIS — Z8619 Personal history of other infectious and parasitic diseases: Secondary | ICD-10-CM | POA: Diagnosis not present

## 2021-11-19 DIAGNOSIS — Z1211 Encounter for screening for malignant neoplasm of colon: Secondary | ICD-10-CM | POA: Diagnosis not present

## 2021-11-19 DIAGNOSIS — Z800 Family history of malignant neoplasm of digestive organs: Secondary | ICD-10-CM | POA: Diagnosis not present

## 2021-11-26 DIAGNOSIS — Z1211 Encounter for screening for malignant neoplasm of colon: Secondary | ICD-10-CM | POA: Diagnosis not present

## 2021-11-26 DIAGNOSIS — K635 Polyp of colon: Secondary | ICD-10-CM | POA: Diagnosis not present

## 2021-11-28 ENCOUNTER — Other Ambulatory Visit: Payer: Self-pay | Admitting: Gastroenterology

## 2021-11-28 DIAGNOSIS — K7469 Other cirrhosis of liver: Secondary | ICD-10-CM

## 2021-12-04 ENCOUNTER — Ambulatory Visit
Admission: RE | Admit: 2021-12-04 | Discharge: 2021-12-04 | Disposition: A | Discharge status: Home / Self Care | Payer: Medicare Other | Source: Ambulatory Visit | Attending: Gastroenterology | Admitting: Gastroenterology

## 2021-12-04 ENCOUNTER — Other Ambulatory Visit: Payer: Self-pay

## 2021-12-04 DIAGNOSIS — K7460 Unspecified cirrhosis of liver: Secondary | ICD-10-CM | POA: Diagnosis not present

## 2021-12-04 DIAGNOSIS — K7469 Other cirrhosis of liver: Secondary | ICD-10-CM

## 2021-12-09 DIAGNOSIS — M25511 Pain in right shoulder: Secondary | ICD-10-CM | POA: Diagnosis not present

## 2021-12-11 DIAGNOSIS — F17210 Nicotine dependence, cigarettes, uncomplicated: Secondary | ICD-10-CM | POA: Diagnosis not present

## 2021-12-11 DIAGNOSIS — M5416 Radiculopathy, lumbar region: Secondary | ICD-10-CM | POA: Diagnosis not present

## 2021-12-11 DIAGNOSIS — G894 Chronic pain syndrome: Secondary | ICD-10-CM | POA: Diagnosis not present

## 2021-12-11 DIAGNOSIS — Z79899 Other long term (current) drug therapy: Secondary | ICD-10-CM | POA: Diagnosis not present

## 2021-12-11 DIAGNOSIS — F17200 Nicotine dependence, unspecified, uncomplicated: Secondary | ICD-10-CM | POA: Diagnosis not present

## 2021-12-11 DIAGNOSIS — R030 Elevated blood-pressure reading, without diagnosis of hypertension: Secondary | ICD-10-CM | POA: Diagnosis not present

## 2021-12-11 DIAGNOSIS — G8929 Other chronic pain: Secondary | ICD-10-CM | POA: Diagnosis not present

## 2021-12-11 DIAGNOSIS — Z96642 Presence of left artificial hip joint: Secondary | ICD-10-CM | POA: Diagnosis not present

## 2021-12-11 DIAGNOSIS — M25552 Pain in left hip: Secondary | ICD-10-CM | POA: Diagnosis not present

## 2022-01-08 DIAGNOSIS — D126 Benign neoplasm of colon, unspecified: Secondary | ICD-10-CM | POA: Diagnosis not present

## 2022-01-08 DIAGNOSIS — Z8619 Personal history of other infectious and parasitic diseases: Secondary | ICD-10-CM | POA: Diagnosis not present

## 2022-01-08 DIAGNOSIS — K649 Unspecified hemorrhoids: Secondary | ICD-10-CM | POA: Diagnosis not present

## 2022-01-09 DIAGNOSIS — M25511 Pain in right shoulder: Secondary | ICD-10-CM | POA: Diagnosis not present

## 2022-01-13 DIAGNOSIS — M5416 Radiculopathy, lumbar region: Secondary | ICD-10-CM | POA: Diagnosis not present

## 2022-01-13 DIAGNOSIS — F17210 Nicotine dependence, cigarettes, uncomplicated: Secondary | ICD-10-CM | POA: Diagnosis not present

## 2022-01-13 DIAGNOSIS — G8929 Other chronic pain: Secondary | ICD-10-CM | POA: Diagnosis not present

## 2022-01-13 DIAGNOSIS — F17200 Nicotine dependence, unspecified, uncomplicated: Secondary | ICD-10-CM | POA: Diagnosis not present

## 2022-01-13 DIAGNOSIS — Z96642 Presence of left artificial hip joint: Secondary | ICD-10-CM | POA: Diagnosis not present

## 2022-01-13 DIAGNOSIS — M25552 Pain in left hip: Secondary | ICD-10-CM | POA: Diagnosis not present

## 2022-01-13 DIAGNOSIS — Z79899 Other long term (current) drug therapy: Secondary | ICD-10-CM | POA: Diagnosis not present

## 2022-01-13 DIAGNOSIS — G894 Chronic pain syndrome: Secondary | ICD-10-CM | POA: Diagnosis not present

## 2022-01-13 DIAGNOSIS — R030 Elevated blood-pressure reading, without diagnosis of hypertension: Secondary | ICD-10-CM | POA: Diagnosis not present

## 2022-01-15 DIAGNOSIS — Z79899 Other long term (current) drug therapy: Secondary | ICD-10-CM | POA: Diagnosis not present

## 2022-02-08 DIAGNOSIS — M25511 Pain in right shoulder: Secondary | ICD-10-CM | POA: Diagnosis not present

## 2022-02-11 DIAGNOSIS — M5416 Radiculopathy, lumbar region: Secondary | ICD-10-CM | POA: Diagnosis not present

## 2022-02-11 DIAGNOSIS — F17200 Nicotine dependence, unspecified, uncomplicated: Secondary | ICD-10-CM | POA: Diagnosis not present

## 2022-02-11 DIAGNOSIS — Z79899 Other long term (current) drug therapy: Secondary | ICD-10-CM | POA: Diagnosis not present

## 2022-02-11 DIAGNOSIS — G894 Chronic pain syndrome: Secondary | ICD-10-CM | POA: Diagnosis not present

## 2022-02-11 DIAGNOSIS — M25552 Pain in left hip: Secondary | ICD-10-CM | POA: Diagnosis not present

## 2022-02-11 DIAGNOSIS — Z96642 Presence of left artificial hip joint: Secondary | ICD-10-CM | POA: Diagnosis not present

## 2022-02-11 DIAGNOSIS — G8929 Other chronic pain: Secondary | ICD-10-CM | POA: Diagnosis not present

## 2022-02-11 DIAGNOSIS — F17210 Nicotine dependence, cigarettes, uncomplicated: Secondary | ICD-10-CM | POA: Diagnosis not present

## 2022-02-17 DIAGNOSIS — Z79899 Other long term (current) drug therapy: Secondary | ICD-10-CM | POA: Diagnosis not present

## 2022-03-11 DIAGNOSIS — M25511 Pain in right shoulder: Secondary | ICD-10-CM | POA: Diagnosis not present

## 2022-03-19 DIAGNOSIS — Z79899 Other long term (current) drug therapy: Secondary | ICD-10-CM | POA: Diagnosis not present

## 2022-03-19 DIAGNOSIS — M129 Arthropathy, unspecified: Secondary | ICD-10-CM | POA: Diagnosis not present

## 2022-03-19 DIAGNOSIS — M25552 Pain in left hip: Secondary | ICD-10-CM | POA: Diagnosis not present

## 2022-03-19 DIAGNOSIS — E559 Vitamin D deficiency, unspecified: Secondary | ICD-10-CM | POA: Diagnosis not present

## 2022-03-19 DIAGNOSIS — G8929 Other chronic pain: Secondary | ICD-10-CM | POA: Diagnosis not present

## 2022-03-19 DIAGNOSIS — Z96642 Presence of left artificial hip joint: Secondary | ICD-10-CM | POA: Diagnosis not present

## 2022-03-19 DIAGNOSIS — G894 Chronic pain syndrome: Secondary | ICD-10-CM | POA: Diagnosis not present

## 2022-03-19 DIAGNOSIS — M545 Low back pain, unspecified: Secondary | ICD-10-CM | POA: Diagnosis not present

## 2022-03-24 DIAGNOSIS — Z79899 Other long term (current) drug therapy: Secondary | ICD-10-CM | POA: Diagnosis not present

## 2022-04-07 DIAGNOSIS — D649 Anemia, unspecified: Secondary | ICD-10-CM | POA: Diagnosis not present

## 2022-04-07 DIAGNOSIS — G8929 Other chronic pain: Secondary | ICD-10-CM | POA: Diagnosis not present

## 2022-04-07 DIAGNOSIS — I10 Essential (primary) hypertension: Secondary | ICD-10-CM | POA: Diagnosis not present

## 2022-04-07 DIAGNOSIS — E785 Hyperlipidemia, unspecified: Secondary | ICD-10-CM | POA: Diagnosis not present

## 2022-04-10 DIAGNOSIS — M25511 Pain in right shoulder: Secondary | ICD-10-CM | POA: Diagnosis not present

## 2022-04-21 DIAGNOSIS — E559 Vitamin D deficiency, unspecified: Secondary | ICD-10-CM | POA: Diagnosis not present

## 2022-04-21 DIAGNOSIS — G629 Polyneuropathy, unspecified: Secondary | ICD-10-CM | POA: Diagnosis not present

## 2022-04-21 DIAGNOSIS — M5416 Radiculopathy, lumbar region: Secondary | ICD-10-CM | POA: Diagnosis not present

## 2022-04-21 DIAGNOSIS — G8929 Other chronic pain: Secondary | ICD-10-CM | POA: Diagnosis not present

## 2022-04-21 DIAGNOSIS — M62830 Muscle spasm of back: Secondary | ICD-10-CM | POA: Diagnosis not present

## 2022-04-21 DIAGNOSIS — M109 Gout, unspecified: Secondary | ICD-10-CM | POA: Diagnosis not present

## 2022-04-21 DIAGNOSIS — K5909 Other constipation: Secondary | ICD-10-CM | POA: Diagnosis not present

## 2022-04-21 DIAGNOSIS — M25552 Pain in left hip: Secondary | ICD-10-CM | POA: Diagnosis not present

## 2022-04-21 DIAGNOSIS — R030 Elevated blood-pressure reading, without diagnosis of hypertension: Secondary | ICD-10-CM | POA: Diagnosis not present

## 2022-04-21 DIAGNOSIS — F17210 Nicotine dependence, cigarettes, uncomplicated: Secondary | ICD-10-CM | POA: Diagnosis not present

## 2022-05-11 DIAGNOSIS — M25511 Pain in right shoulder: Secondary | ICD-10-CM | POA: Diagnosis not present

## 2022-05-22 DIAGNOSIS — M5416 Radiculopathy, lumbar region: Secondary | ICD-10-CM | POA: Diagnosis not present

## 2022-05-22 DIAGNOSIS — M25552 Pain in left hip: Secondary | ICD-10-CM | POA: Diagnosis not present

## 2022-05-22 DIAGNOSIS — R030 Elevated blood-pressure reading, without diagnosis of hypertension: Secondary | ICD-10-CM | POA: Diagnosis not present

## 2022-05-22 DIAGNOSIS — G8929 Other chronic pain: Secondary | ICD-10-CM | POA: Diagnosis not present

## 2022-05-22 DIAGNOSIS — M109 Gout, unspecified: Secondary | ICD-10-CM | POA: Diagnosis not present

## 2022-05-22 DIAGNOSIS — K5909 Other constipation: Secondary | ICD-10-CM | POA: Diagnosis not present

## 2022-05-22 DIAGNOSIS — G629 Polyneuropathy, unspecified: Secondary | ICD-10-CM | POA: Diagnosis not present

## 2022-05-22 DIAGNOSIS — F17210 Nicotine dependence, cigarettes, uncomplicated: Secondary | ICD-10-CM | POA: Diagnosis not present

## 2022-05-22 DIAGNOSIS — M62830 Muscle spasm of back: Secondary | ICD-10-CM | POA: Diagnosis not present

## 2022-05-27 DIAGNOSIS — Z79899 Other long term (current) drug therapy: Secondary | ICD-10-CM | POA: Diagnosis not present

## 2022-06-11 DIAGNOSIS — M25511 Pain in right shoulder: Secondary | ICD-10-CM | POA: Diagnosis not present

## 2022-06-20 DIAGNOSIS — M109 Gout, unspecified: Secondary | ICD-10-CM | POA: Diagnosis not present

## 2022-06-20 DIAGNOSIS — M25552 Pain in left hip: Secondary | ICD-10-CM | POA: Diagnosis not present

## 2022-06-20 DIAGNOSIS — G629 Polyneuropathy, unspecified: Secondary | ICD-10-CM | POA: Diagnosis not present

## 2022-06-20 DIAGNOSIS — M62830 Muscle spasm of back: Secondary | ICD-10-CM | POA: Diagnosis not present

## 2022-06-20 DIAGNOSIS — R030 Elevated blood-pressure reading, without diagnosis of hypertension: Secondary | ICD-10-CM | POA: Diagnosis not present

## 2022-06-20 DIAGNOSIS — M5416 Radiculopathy, lumbar region: Secondary | ICD-10-CM | POA: Diagnosis not present

## 2022-06-20 DIAGNOSIS — K5909 Other constipation: Secondary | ICD-10-CM | POA: Diagnosis not present

## 2022-06-20 DIAGNOSIS — F17210 Nicotine dependence, cigarettes, uncomplicated: Secondary | ICD-10-CM | POA: Diagnosis not present

## 2022-06-20 DIAGNOSIS — G8929 Other chronic pain: Secondary | ICD-10-CM | POA: Diagnosis not present

## 2022-07-07 DIAGNOSIS — M25552 Pain in left hip: Secondary | ICD-10-CM | POA: Diagnosis not present

## 2022-07-07 DIAGNOSIS — R030 Elevated blood-pressure reading, without diagnosis of hypertension: Secondary | ICD-10-CM | POA: Diagnosis not present

## 2022-07-07 DIAGNOSIS — K5909 Other constipation: Secondary | ICD-10-CM | POA: Diagnosis not present

## 2022-07-07 DIAGNOSIS — F17210 Nicotine dependence, cigarettes, uncomplicated: Secondary | ICD-10-CM | POA: Diagnosis not present

## 2022-07-07 DIAGNOSIS — M5416 Radiculopathy, lumbar region: Secondary | ICD-10-CM | POA: Diagnosis not present

## 2022-07-07 DIAGNOSIS — G629 Polyneuropathy, unspecified: Secondary | ICD-10-CM | POA: Diagnosis not present

## 2022-07-07 DIAGNOSIS — G8929 Other chronic pain: Secondary | ICD-10-CM | POA: Diagnosis not present

## 2022-07-07 DIAGNOSIS — M62830 Muscle spasm of back: Secondary | ICD-10-CM | POA: Diagnosis not present

## 2022-07-07 DIAGNOSIS — M109 Gout, unspecified: Secondary | ICD-10-CM | POA: Diagnosis not present

## 2022-07-11 DIAGNOSIS — M25511 Pain in right shoulder: Secondary | ICD-10-CM | POA: Diagnosis not present

## 2022-07-14 ENCOUNTER — Encounter: Payer: Self-pay | Admitting: Internal Medicine

## 2022-07-14 ENCOUNTER — Ambulatory Visit (INDEPENDENT_AMBULATORY_CARE_PROVIDER_SITE_OTHER): Payer: Medicare Other | Admitting: Internal Medicine

## 2022-07-14 LAB — BASIC METABOLIC PANEL
BUN: 11 mg/dL (ref 6–23)
CO2: 23 mEq/L (ref 19–32)
Calcium: 11 mg/dL — ABNORMAL HIGH (ref 8.4–10.5)
Chloride: 102 mEq/L (ref 96–112)
Creatinine, Ser: 1.1 mg/dL (ref 0.40–1.50)
GFR: 71.8 mL/min (ref >60.00)
Glucose, Bld: 111 mg/dL — ABNORMAL HIGH (ref 70–99)
Potassium: 4.3 mEq/L (ref 3.5–5.1)
Sodium: 133 mEq/L — ABNORMAL LOW (ref 135–145)

## 2022-07-14 LAB — VITAMIN D 25 HYDROXY (VIT D DEFICIENCY, FRACTURES): VITD: 63.23 ng/mL (ref 30.00–100.00)

## 2022-07-14 LAB — ALBUMIN: Albumin: 4 g/dL (ref 3.5–5.2)

## 2022-07-14 NOTE — Progress Notes (Signed)
Name: Calvin [name]  MRN/ DOB: [id number], 04/06/1961    Age/ Sex: 61 y.o., male    PCP: Jenny Reichmann, PA-C   Reason for Endocrinology Evaluation: hypercalcemia     Date of Initial Endocrinology Evaluation: 07/14/2022     HPI: [name] is a 61 y.o. male with a past medical history of liver cirrhosis, hep C, alcohol abuse, and HTN. The patient presented for initial endocrinology clinic visit on 07/14/2022 for consultative assistance with his hypercalcemia .   [name] indicates that he was first diagnosed with hypercalcemia intermittent hypercalcemia since  2010 with a max serum calcium 10.9 mg/DL  (uncorrected) , not PTH levels at the time of his presentation to our clinic and no Vitamin D levels.   Of note, the patient follows with hepatology for hepatic cirrhosis, chronic hepatitis C  since that time, he has  experienced symptoms of constipation, polyuria, polydipsia. He denies use of over the counter calcium (including supplements, Tums, Rolaids, or other calcium containing antacids), lithium, HCTZ, or vitamin D supplements.   He has history of kidney stones, and liver disease, liver disease,but no kidney disease or  granulomatous disease. He denies  osteoporosis but or prior fractures. Daily dietary calcium intake: 1 servings  He denies  family history of osteoporosis, parathyroid disease, thyroid disease.        HISTORY:  Past Medical History:  Past Medical History:  Diagnosis Date   Ascites    Chronic left hip pain    Cirrhosis ([place])    Hypertension    Stroke [facility])    TIA (transient ischemic attack) 06/23/2016   Past Surgical History:  Past Surgical History:  Procedure Laterality Date   BACK SURGERY     CERVICAL SPINE SURGERY     ESOPHAGEAL BANDING N/A 01/25/2021   Procedure: ESOPHAGEAL BANDING;  Surgeon: Otis Brace, MD;  Location: [facility];  Service: Gastroenterology;  Laterality: N/A;   ESOPHAGOGASTRODUODENOSCOPY (EGD) WITH  PROPOFOL N/A 01/25/2021   Procedure: ESOPHAGOGASTRODUODENOSCOPY (EGD) WITH PROPOFOL;  Surgeon: Otis Brace, MD;  Location: [facility];  Service: Gastroenterology;  Laterality: N/A;   ESOPHAGOGASTRODUODENOSCOPY (EGD) WITH PROPOFOL N/A 05/21/2021   Procedure: ESOPHAGOGASTRODUODENOSCOPY (EGD) WITH PROPOFOL;  Surgeon: Otis Brace, MD;  Location: [facility];  Service: Gastroenterology;  Laterality: N/A;   JOINT REPLACEMENT      Social History:  reports that he has been smoking cigarettes. He has never used smokeless tobacco. He reports current alcohol use. He reports that he does not use drugs. Family History: Family history is unknown by patient.   HOME MEDICATIONS: Allergies as of 07/14/2022   No Known Allergies      Medication List        Accurate as of July 14, 2022  8:41 AM. If you have any questions, ask your nurse or doctor.          allopurinol 300 MG tablet Commonly known as: ZYLOPRIM Take 300 mg by mouth daily.   amLODipine 5 MG tablet Commonly known as: NORVASC Take 5 mg by mouth daily.   buprenorphine 7.5 MCG/HR Commonly known as: BUTRANS 1 patch once a week.   diphenhydrAMINE 25 MG tablet Commonly known as: BENADRYL Take 25 mg by mouth in the morning and at bedtime.   folic acid 1 MG tablet Commonly known as: FOLVITE Take 1 mg by mouth daily.   furosemide 20 MG tablet Commonly known as: LASIX Take 20 mg by mouth daily.   hydrocortisone cream 1 % Apply  1 application topically 3 (three) times daily as needed for itching.   lactulose 10 GM/15ML solution Commonly known as: CHRONULAC Take 30 mLs (20 g total) by mouth 2 (two) times daily.   nicotine 21 mg/24hr patch Commonly known as: NICODERM CQ - dosed in mg/24 hours Place 1 patch (21 mg total) onto the skin daily.   omeprazole 20 MG tablet Commonly known as: PriLOSEC OTC Take 2 tablets (40 mg total) by mouth daily.   Oxycodone HCl 10 MG Tabs Take 10 mg by mouth in the morning, at  noon, and at bedtime.   propranolol 10 MG tablet Commonly known as: INDERAL Take 10 mg by mouth 2 (two) times daily.   QUEtiapine 25 MG tablet Commonly known as: SEROQUEL Take 1 tablet (25 mg total) by mouth at bedtime as needed (psychosis, confusion, agitaiton).   thiamine 100 MG tablet Commonly known as: VITAMIN B1 Take 1 tablet (100 mg total) by mouth daily.   tiZANidine 4 MG tablet Commonly known as: ZANAFLEX Take 4 mg by mouth 3 (three) times daily as needed for muscle spasms.   Vitamin D (Ergocalciferol) 1.25 MG (50000 UNIT) Caps capsule Commonly known as: DRISDOL Take 50,000 Units by mouth every Sunday.          REVIEW OF SYSTEMS: A comprehensive ROS was conducted with the patient and is negative except as per HPI    OBJECTIVE:  VS: BP 130/84 (BP Location: Left Arm, Patient Position: Sitting, Cuff Size: Large)   Pulse 90   Ht '5\' 7"'$  (1.702 m)   Wt 177 lb 12.8 oz (80.6 kg)   SpO2 98%   BMI 27.85 kg/m    Wt Readings from Last 3 Encounters:  07/14/22 177 lb 12.8 oz (80.6 kg)  05/21/21 179 lb 14.3 oz (81.6 kg)  01/25/21 182 lb (82.6 kg)     EXAM: General: Pt appears well and is in NAD  Eyes: External eye exam normal without stare, lid lag or exophthalmos.  EOM intact.  PERRL.  Neck: General: Supple without adenopathy. Thyroid: Thyroid size normal.  No goiter or nodules appreciated. No thyroid bruit.  Lungs: Clear with good BS bilat with no rales, rhonchi, or wheezes  Heart: Auscultation: RRR.  Abdomen: Normoactive bowel sounds, soft, nontender, without masses or organomegaly palpable  Extremities:  BL LE: No pretibial edema normal ROM and strength.  Mental Status: Judgment, insight: Intact Orientation: Oriented to time, place, and person Mood and affect: No depression, anxiety, or agitation     DATA REVIEWED:     Latest Reference Range & Units 07/14/22 08:49  Sodium 135 - 145 mEq/L 133 (L)  Potassium 3.5 - 5.1 mEq/L 4.3  Chloride 96 - 112 mEq/L  102  CO2 19 - 32 mEq/L 23  Glucose 70 - 99 mg/dL 111 (H)  BUN 6 - 23 mg/dL 11  Creatinine 0.40 - 1.50 mg/dL 1.10  Calcium 8.4 - 10.5 mg/dL 11.0 (H)  Calcium Ionized 4.7 - 5.5 mg/dL 5.6 (H)  Albumin 3.5 - 5.2 g/dL 4.0  GFR >60.00 mL/min 71.80    Latest Reference Range & Units 07/14/22 08:49  VITD 30.00 - 100.00 ng/mL 63.23    Latest Reference Range & Units 07/14/22 08:49  PTH, Intact 16 - 77 pg/mL 29    ASSESSMENT/PLAN/RECOMMENDATIONS:   Hypercalcemia :   -PTH vs non PTH hypercalcemia  -Repeat lab continues to show elevated calcium with inappropriately normal PTH - Vitamin D is normal  -Patient will need further evaluation to determine surgical  candidacy for parathyroidectomy   Recommendations Stay Hydrated  Avoid over the counter calcium tablets  Maintain 2-3 servings of calcium through the diet daily ( low fat dairy , green leafy vegetables)   F/U in 4 months   Signed electronically by: Mack Guise, MD  [facility] Endocrinology  [facility] [address] [place], [place] [address] Phone: [phone] FAX: [phone]   CC: [address] [place] [address] Phone: [phone] Fax: [phone]   Return to Endocrinology clinic as below: No future appointments.

## 2022-07-14 NOTE — Patient Instructions (Signed)
Stay Hydrated  Avoid over the counter calcium tablets  Maintain 2-3 servings of calcium through the diet daily ( low fat dairy , green leafy vegetables)

## 2022-07-15 ENCOUNTER — Encounter: Payer: Self-pay | Admitting: Internal Medicine

## 2022-07-18 LAB — VITAMIN D 1,25 DIHYDROXY
Vitamin D 1, 25 (OH)2 Total: 19 pg/mL (ref 18–72)
Vitamin D2 1, 25 (OH)2: 19 pg/mL
Vitamin D3 1, 25 (OH)2: <8 pg/mL

## 2022-07-18 LAB — PARATHYROID HORMONE, INTACT (NO CA): PTH: 29 pg/mL (ref 16–77)

## 2022-07-18 LAB — CALCIUM, IONIZED: Calcium, Ion: 5.6 mg/dL — ABNORMAL HIGH (ref 4.7–5.5)

## 2022-07-22 ENCOUNTER — Other Ambulatory Visit: Payer: Self-pay | Admitting: Gastroenterology

## 2022-07-22 DIAGNOSIS — K7290 Hepatic failure, unspecified without coma: Secondary | ICD-10-CM | POA: Diagnosis not present

## 2022-07-22 DIAGNOSIS — Z8619 Personal history of other infectious and parasitic diseases: Secondary | ICD-10-CM | POA: Diagnosis not present

## 2022-07-22 DIAGNOSIS — I8500 Esophageal varices without bleeding: Secondary | ICD-10-CM | POA: Diagnosis not present

## 2022-07-22 DIAGNOSIS — K7460 Unspecified cirrhosis of liver: Secondary | ICD-10-CM

## 2022-07-24 ENCOUNTER — Other Ambulatory Visit: Payer: Self-pay | Admitting: Gastroenterology

## 2022-07-24 DIAGNOSIS — G8929 Other chronic pain: Secondary | ICD-10-CM | POA: Diagnosis not present

## 2022-07-24 DIAGNOSIS — D649 Anemia, unspecified: Secondary | ICD-10-CM | POA: Diagnosis not present

## 2022-07-24 DIAGNOSIS — I10 Essential (primary) hypertension: Secondary | ICD-10-CM | POA: Diagnosis not present

## 2022-07-24 DIAGNOSIS — R772 Abnormality of alphafetoprotein: Secondary | ICD-10-CM

## 2022-07-24 DIAGNOSIS — E785 Hyperlipidemia, unspecified: Secondary | ICD-10-CM | POA: Diagnosis not present

## 2022-07-29 DIAGNOSIS — G629 Polyneuropathy, unspecified: Secondary | ICD-10-CM | POA: Diagnosis not present

## 2022-07-29 DIAGNOSIS — I10 Essential (primary) hypertension: Secondary | ICD-10-CM | POA: Diagnosis not present

## 2022-08-11 DIAGNOSIS — M25511 Pain in right shoulder: Secondary | ICD-10-CM | POA: Diagnosis not present

## 2022-08-21 DIAGNOSIS — G8929 Other chronic pain: Secondary | ICD-10-CM | POA: Diagnosis not present

## 2022-08-21 DIAGNOSIS — M5416 Radiculopathy, lumbar region: Secondary | ICD-10-CM | POA: Diagnosis not present

## 2022-08-21 DIAGNOSIS — M25552 Pain in left hip: Secondary | ICD-10-CM | POA: Diagnosis not present

## 2022-08-21 DIAGNOSIS — R030 Elevated blood-pressure reading, without diagnosis of hypertension: Secondary | ICD-10-CM | POA: Diagnosis not present

## 2022-08-21 DIAGNOSIS — F17210 Nicotine dependence, cigarettes, uncomplicated: Secondary | ICD-10-CM | POA: Diagnosis not present

## 2022-08-21 DIAGNOSIS — G629 Polyneuropathy, unspecified: Secondary | ICD-10-CM | POA: Diagnosis not present

## 2022-08-21 DIAGNOSIS — K5909 Other constipation: Secondary | ICD-10-CM | POA: Diagnosis not present

## 2022-08-21 DIAGNOSIS — M62830 Muscle spasm of back: Secondary | ICD-10-CM | POA: Diagnosis not present

## 2022-08-21 DIAGNOSIS — M109 Gout, unspecified: Secondary | ICD-10-CM | POA: Diagnosis not present

## 2022-08-21 DIAGNOSIS — R059 Cough, unspecified: Secondary | ICD-10-CM | POA: Diagnosis not present

## 2022-08-21 DIAGNOSIS — Z96642 Presence of left artificial hip joint: Secondary | ICD-10-CM | POA: Diagnosis not present

## 2022-08-29 ENCOUNTER — Other Ambulatory Visit: Payer: Medicare Other

## 2022-09-01 ENCOUNTER — Emergency Department (HOSPITAL_COMMUNITY): Payer: Medicare Other

## 2022-09-01 ENCOUNTER — Observation Stay (HOSPITAL_COMMUNITY): Payer: Medicare Other

## 2022-09-01 ENCOUNTER — Inpatient Hospital Stay (HOSPITAL_COMMUNITY)
Admission: EM | Admit: 2022-09-01 | Discharge: 2022-09-08 | DRG: 436 | Disposition: A | Discharge status: Home Health | Payer: Medicare Other | Attending: Internal Medicine | Admitting: Internal Medicine

## 2022-09-01 ENCOUNTER — Other Ambulatory Visit: Payer: Self-pay

## 2022-09-01 ENCOUNTER — Encounter (HOSPITAL_COMMUNITY): Payer: Self-pay

## 2022-09-01 DIAGNOSIS — E785 Hyperlipidemia, unspecified: Secondary | ICD-10-CM | POA: Diagnosis present

## 2022-09-01 DIAGNOSIS — Z66 Do not resuscitate: Secondary | ICD-10-CM | POA: Diagnosis present

## 2022-09-01 DIAGNOSIS — K652 Spontaneous bacterial peritonitis: Secondary | ICD-10-CM | POA: Insufficient documentation

## 2022-09-01 DIAGNOSIS — N19 Unspecified kidney failure: Secondary | ICD-10-CM | POA: Diagnosis not present

## 2022-09-01 DIAGNOSIS — R160 Hepatomegaly, not elsewhere classified: Secondary | ICD-10-CM | POA: Diagnosis not present

## 2022-09-01 DIAGNOSIS — E876 Hypokalemia: Secondary | ICD-10-CM | POA: Diagnosis not present

## 2022-09-01 DIAGNOSIS — K8020 Calculus of gallbladder without cholecystitis without obstruction: Secondary | ICD-10-CM | POA: Diagnosis not present

## 2022-09-01 DIAGNOSIS — R0602 Shortness of breath: Secondary | ICD-10-CM | POA: Diagnosis not present

## 2022-09-01 DIAGNOSIS — R590 Localized enlarged lymph nodes: Secondary | ICD-10-CM | POA: Diagnosis not present

## 2022-09-01 DIAGNOSIS — R471 Dysarthria and anarthria: Secondary | ICD-10-CM | POA: Diagnosis present

## 2022-09-01 DIAGNOSIS — K7031 Alcoholic cirrhosis of liver with ascites: Secondary | ICD-10-CM | POA: Diagnosis present

## 2022-09-01 DIAGNOSIS — G8929 Other chronic pain: Secondary | ICD-10-CM | POA: Diagnosis not present

## 2022-09-01 DIAGNOSIS — K7469 Other cirrhosis of liver: Secondary | ICD-10-CM | POA: Diagnosis not present

## 2022-09-01 DIAGNOSIS — D638 Anemia in other chronic diseases classified elsewhere: Secondary | ICD-10-CM | POA: Diagnosis present

## 2022-09-01 DIAGNOSIS — D72829 Elevated white blood cell count, unspecified: Secondary | ICD-10-CM | POA: Insufficient documentation

## 2022-09-01 DIAGNOSIS — E860 Dehydration: Secondary | ICD-10-CM | POA: Diagnosis present

## 2022-09-01 DIAGNOSIS — C220 Liver cell carcinoma: Secondary | ICD-10-CM | POA: Diagnosis not present

## 2022-09-01 DIAGNOSIS — F17210 Nicotine dependence, cigarettes, uncomplicated: Secondary | ICD-10-CM | POA: Diagnosis present

## 2022-09-01 DIAGNOSIS — B1920 Unspecified viral hepatitis C without hepatic coma: Secondary | ICD-10-CM | POA: Diagnosis not present

## 2022-09-01 DIAGNOSIS — E8721 Acute metabolic acidosis: Secondary | ICD-10-CM | POA: Diagnosis not present

## 2022-09-01 DIAGNOSIS — R531 Weakness: Secondary | ICD-10-CM

## 2022-09-01 DIAGNOSIS — E8729 Other acidosis: Secondary | ICD-10-CM | POA: Insufficient documentation

## 2022-09-01 DIAGNOSIS — I10 Essential (primary) hypertension: Secondary | ICD-10-CM | POA: Diagnosis present

## 2022-09-01 DIAGNOSIS — K7689 Other specified diseases of liver: Secondary | ICD-10-CM | POA: Diagnosis not present

## 2022-09-01 DIAGNOSIS — K766 Portal hypertension: Secondary | ICD-10-CM | POA: Diagnosis present

## 2022-09-01 DIAGNOSIS — K7030 Alcoholic cirrhosis of liver without ascites: Secondary | ICD-10-CM | POA: Diagnosis not present

## 2022-09-01 DIAGNOSIS — Z8673 Personal history of transient ischemic attack (TIA), and cerebral infarction without residual deficits: Secondary | ICD-10-CM

## 2022-09-01 DIAGNOSIS — H5461 Unqualified visual loss, right eye, normal vision left eye: Secondary | ICD-10-CM | POA: Diagnosis present

## 2022-09-01 DIAGNOSIS — R29810 Facial weakness: Secondary | ICD-10-CM | POA: Diagnosis not present

## 2022-09-01 DIAGNOSIS — I8510 Secondary esophageal varices without bleeding: Secondary | ICD-10-CM | POA: Diagnosis present

## 2022-09-01 DIAGNOSIS — B182 Chronic viral hepatitis C: Secondary | ICD-10-CM | POA: Diagnosis present

## 2022-09-01 DIAGNOSIS — K4090 Unilateral inguinal hernia, without obstruction or gangrene, not specified as recurrent: Secondary | ICD-10-CM | POA: Diagnosis present

## 2022-09-01 DIAGNOSIS — Z515 Encounter for palliative care: Secondary | ICD-10-CM | POA: Diagnosis not present

## 2022-09-01 DIAGNOSIS — N179 Acute kidney failure, unspecified: Secondary | ICD-10-CM | POA: Diagnosis present

## 2022-09-01 DIAGNOSIS — Z79899 Other long term (current) drug therapy: Secondary | ICD-10-CM

## 2022-09-01 DIAGNOSIS — K7682 Hepatic encephalopathy: Secondary | ICD-10-CM | POA: Diagnosis not present

## 2022-09-01 DIAGNOSIS — Z20822 Contact with and (suspected) exposure to covid-19: Secondary | ICD-10-CM | POA: Diagnosis not present

## 2022-09-01 DIAGNOSIS — M25552 Pain in left hip: Secondary | ICD-10-CM | POA: Diagnosis present

## 2022-09-01 DIAGNOSIS — Z7189 Other specified counseling: Secondary | ICD-10-CM | POA: Diagnosis not present

## 2022-09-01 DIAGNOSIS — N200 Calculus of kidney: Secondary | ICD-10-CM | POA: Diagnosis not present

## 2022-09-01 DIAGNOSIS — R29818 Other symptoms and signs involving the nervous system: Secondary | ICD-10-CM | POA: Diagnosis not present

## 2022-09-01 LAB — CBC WITH DIFFERENTIAL/PLATELET
Abs Immature Granulocytes: 0.42 10*3/uL — ABNORMAL HIGH (ref 0.00–0.07)
Basophils Absolute: 0.1 10*3/uL (ref 0.0–0.1)
Basophils Relative: 0 %
Eosinophils Absolute: 0 10*3/uL (ref 0.0–0.5)
Eosinophils Relative: 0 %
HCT: 31.5 % — ABNORMAL LOW (ref 39.0–52.0)
Hemoglobin: 11 g/dL — ABNORMAL LOW (ref 13.0–17.0)
Immature Granulocytes: 2 %
Lymphocytes Relative: 11 %
Lymphs Abs: 2 10*3/uL (ref 0.7–4.0)
MCH: 32.2 pg (ref 26.0–34.0)
MCHC: 34.9 g/dL (ref 30.0–36.0)
MCV: 92.1 fL (ref 80.0–100.0)
Monocytes Absolute: 1.9 10*3/uL — ABNORMAL HIGH (ref 0.1–1.0)
Monocytes Relative: 11 %
Neutro Abs: 13.2 10*3/uL — ABNORMAL HIGH (ref 1.7–7.7)
Neutrophils Relative %: 76 %
Platelets: 473 10*3/uL — ABNORMAL HIGH (ref 150–400)
RBC: 3.42 MIL/uL — ABNORMAL LOW (ref 4.22–5.81)
RDW: 15.1 % (ref 11.5–15.5)
WBC: 17.6 10*3/uL — ABNORMAL HIGH (ref 4.0–10.5)
nRBC: 9.1 % — ABNORMAL HIGH (ref 0.0–0.2)

## 2022-09-01 LAB — COMPREHENSIVE METABOLIC PANEL
ALT: 55 U/L — ABNORMAL HIGH (ref 0–44)
AST: 59 U/L — ABNORMAL HIGH (ref 15–41)
Albumin: 2.5 g/dL — ABNORMAL LOW (ref 3.5–5.0)
Alkaline Phosphatase: 231 U/L — ABNORMAL HIGH (ref 38–126)
Anion gap: 20 — ABNORMAL HIGH (ref 5–15)
BUN: 90 mg/dL — ABNORMAL HIGH (ref 8–23)
CO2: 14 mmol/L — ABNORMAL LOW (ref 22–32)
Calcium: 9.4 mg/dL (ref 8.9–10.3)
Chloride: 99 mmol/L (ref 98–111)
Creatinine, Ser: 5.4 mg/dL — ABNORMAL HIGH (ref 0.61–1.24)
GFR, Estimated: 11 mL/min — ABNORMAL LOW (ref >60)
Glucose, Bld: 144 mg/dL — ABNORMAL HIGH (ref 70–99)
Potassium: 4.2 mmol/L (ref 3.5–5.1)
Sodium: 133 mmol/L — ABNORMAL LOW (ref 135–145)
Total Bilirubin: 2 mg/dL — ABNORMAL HIGH (ref 0.3–1.2)
Total Protein: 8.8 g/dL — ABNORMAL HIGH (ref 6.5–8.1)

## 2022-09-01 LAB — RESP PANEL BY RT-PCR (RSV, FLU A&B, COVID)  RVPGX2
Influenza A by PCR: NEGATIVE
Influenza B by PCR: NEGATIVE
Resp Syncytial Virus by PCR: NEGATIVE
SARS Coronavirus 2 by RT PCR: NEGATIVE

## 2022-09-01 LAB — PROTIME-INR
INR: 1.5 — ABNORMAL HIGH (ref 0.8–1.2)
Prothrombin Time: 18.1 seconds — ABNORMAL HIGH (ref 11.4–15.2)

## 2022-09-01 LAB — HIV ANTIBODY (ROUTINE TESTING W REFLEX): HIV Screen 4th Generation wRfx: NONREACTIVE

## 2022-09-01 LAB — URINALYSIS, ROUTINE W REFLEX MICROSCOPIC
Bilirubin Urine: NEGATIVE
Glucose, UA: NEGATIVE mg/dL
Hgb urine dipstick: NEGATIVE
Ketones, ur: NEGATIVE mg/dL
Leukocytes,Ua: NEGATIVE
Nitrite: NEGATIVE
Protein, ur: NEGATIVE mg/dL
Specific Gravity, Urine: 1.011 (ref 1.005–1.030)
pH: 5 (ref 5.0–8.0)

## 2022-09-01 LAB — BRAIN NATRIURETIC PEPTIDE: B Natriuretic Peptide: 74 pg/mL (ref 0.0–100.0)

## 2022-09-01 LAB — AMMONIA: Ammonia: 71 umol/L — ABNORMAL HIGH (ref 9–35)

## 2022-09-01 LAB — TROPONIN I (HIGH SENSITIVITY)
Troponin I (High Sensitivity): 4 ng/L (ref <18)
Troponin I (High Sensitivity): 4 ng/L (ref <18)

## 2022-09-01 LAB — LIPASE, BLOOD: Lipase: 42 U/L (ref 11–51)

## 2022-09-01 LAB — MAGNESIUM: Magnesium: 2.7 mg/dL — ABNORMAL HIGH (ref 1.7–2.4)

## 2022-09-01 LAB — APTT: aPTT: 38 seconds — ABNORMAL HIGH (ref 24–36)

## 2022-09-01 LAB — ETHANOL: Alcohol, Ethyl (B): <10 mg/dL (ref <10)

## 2022-09-01 MED ORDER — FOLIC ACID 1 MG PO TABS
1.0000 mg | ORAL_TABLET | Freq: Every day | ORAL | Status: DC
  Administered 2022-09-02 – 2022-09-08 (×6): 1 mg via ORAL
  Filled 2022-09-01 (×6): qty 1

## 2022-09-01 MED ORDER — LACTATED RINGERS IV SOLN
INTRAVENOUS | Status: DC
  Administered 2022-09-03: 75 mL/h via INTRAVENOUS

## 2022-09-01 MED ORDER — ACETAMINOPHEN 650 MG RE SUPP: 650.0000 mg | Freq: Four times a day (QID) | RECTAL | Status: DC | PRN

## 2022-09-01 MED ORDER — LACTATED RINGERS IV BOLUS
1000.0000 mL | Freq: Once | INTRAVENOUS | Status: AC
  Administered 2022-09-01: 1000 mL via INTRAVENOUS

## 2022-09-01 MED ORDER — ACETAMINOPHEN 325 MG PO TABS
650.0000 mg | ORAL_TABLET | Freq: Four times a day (QID) | ORAL | Status: DC | PRN
  Administered 2022-09-02 – 2022-09-04 (×4): 650 mg via ORAL
  Filled 2022-09-01 (×5): qty 2

## 2022-09-01 MED ORDER — ALBUMIN HUMAN 5 % IV SOLN
25.0000 g | Freq: Once | INTRAVENOUS | Status: AC
  Administered 2022-09-01: 25 g via INTRAVENOUS
  Filled 2022-09-01: qty 500

## 2022-09-01 MED ORDER — SODIUM CHLORIDE 0.9% FLUSH
3.0000 mL | Freq: Two times a day (BID) | INTRAVENOUS | Status: DC
  Administered 2022-09-02 – 2022-09-08 (×10): 3 mL via INTRAVENOUS

## 2022-09-01 MED ORDER — HEPARIN SODIUM (PORCINE) 5000 UNIT/ML IJ SOLN
5000.0000 [IU] | Freq: Three times a day (TID) | INTRAMUSCULAR | Status: DC
  Administered 2022-09-01 – 2022-09-03 (×7): 5000 [IU] via SUBCUTANEOUS
  Filled 2022-09-01 (×8): qty 1

## 2022-09-01 MED ORDER — PROPRANOLOL HCL 20 MG PO TABS
10.0000 mg | ORAL_TABLET | Freq: Two times a day (BID) | ORAL | Status: DC
  Administered 2022-09-01 – 2022-09-07 (×12): 10 mg via ORAL
  Filled 2022-09-01 (×13): qty 1

## 2022-09-01 MED ORDER — THIAMINE MONONITRATE 100 MG PO TABS
100.0000 mg | ORAL_TABLET | Freq: Every day | ORAL | Status: DC
  Administered 2022-09-02 – 2022-09-08 (×6): 100 mg via ORAL
  Filled 2022-09-01 (×9): qty 1

## 2022-09-01 MED ORDER — PANTOPRAZOLE SODIUM 20 MG PO TBEC
40.0000 mg | DELAYED_RELEASE_TABLET | Freq: Two times a day (BID) | ORAL | Status: DC
  Administered 2022-09-02 – 2022-09-08 (×12): 40 mg via ORAL
  Filled 2022-09-01 (×12): qty 2

## 2022-09-01 MED ORDER — POLYETHYLENE GLYCOL 3350 17 G PO PACK: 17.0000 g | PACK | Freq: Every day | ORAL | Status: DC | PRN

## 2022-09-01 NOTE — ED Provider Notes (Signed)
  Physical Exam  BP 129/81   Pulse 89   Temp 98.5 F (36.9 C) (Oral)   Resp 15   SpO2 100%   Physical Exam  Procedures  .Critical Care  Performed by: Varney Biles, MD Authorized by: Varney Biles, MD   Critical care provider statement:    Critical care time (minutes):  33   Critical care was necessary to treat or prevent imminent or life-threatening deterioration of the following conditions:  Hepatic failure, CNS failure or compromise and renal failure   Critical care was time spent personally by me on the following activities:  Development of treatment plan with patient or surrogate, discussions with consultants, evaluation of patient's response to treatment, examination of patient, ordering and review of laboratory studies, ordering and review of radiographic studies, ordering and performing treatments and interventions, pulse oximetry, re-evaluation of patient's condition, review of old charts and obtaining history from patient or surrogate   ED Course / MDM    Medical Decision Making Amount and/or Complexity of Data Reviewed Labs: ordered. Radiology: ordered.  Risk Prescription drug management. Decision regarding hospitalization.   61 year old male comes in with chief complaint of weakness.  Patient has had 6 falls over the weekend.  He is complaining of right-sided abdominal pain.  Patient has history of hypertension, hyperlipidemia, hep C with liver cirrhosis, alcoholism.  Patient noted to have worsening liver function tests and acute renal failure.  He has significant uremia.  Patient assessed.  He is confused, unable to carry on with the conversation.  He is moving all 4 extremities.  On exam he has abdominal discomfort on the right side.  No clear hepatomegaly appreciated.    CT renal stone was ordered, results are as following: Hepatobiliary: Indistinct mass density within and along the medial  margin the right hepatic lobe substantially abuts the right  kidney  upper pole. I suspect that this is probably arising from the liver  given the degree of hepatic heterogeneity which may represent other  lesions in the right hepatic lobe and segment 4. Nodular liver  contour possibly from cirrhosis or pseudo cirrhosis. Underlying mass  difficult to measure but potentially around 10 cm in diameter. Right  adrenal gland indistinguishable from the mass. The dominant mass  likewise substantially abuts the IVC and may extend in the right  porta hepatis region adjacent to the pancreatic head.    1.9 cm gallstone in the gallbladder.    Patient's white count is noted to be elevated.  He has no fevers, and overall there is low suspicion for infection.  White count is likely reactive in the setting of liver mass that is noted.  I have added INR, ammonia to the workup.  Urine analysis is pending at this time. I have initiated CIWA protocol.  Patient will need admission to the hospital for his renal failure. I have ordered maintenance fluid for now.  I will give a dose of albumin as patient could be having hepatorenal syndrome. Urine sodium and urine creatinine also added to calculate FeNa.    Varney Biles, MD 09/01/22 [id number]

## 2022-09-01 NOTE — ED Notes (Signed)
Pt transported to MRI 

## 2022-09-01 NOTE — H&P (Addendum)
History and Physical   [name] XFG:[id number] DOB: 1961/07/27 DOA: 09/01/2022  PCP: Calvin Reichmann, Calvin [name]   Patient coming from: Home  Chief Complaint: Weakness, fall  HPI: Calvin [name] is a 61 y.o. male with medical history significant of hyperlipidemia, hypertension, anemia, alcohol use, TIA/stroke, cirrhosis, hepatitis C, right eye blindness presenting with weakness and fall.  Patient feeling generally weak the past several days and had about 6 falls over the weekend.  Reports decreased appetite with some nausea and vomiting as well.  Also reporting speech difficulty which is somewhat chronic for him and some facial droop for the past couple days which is new.  He became concerned for possible stroke as he has history of this.  States he would have gone to the ED earlier but he did not have a ride, unclear why he did not call EMS.  He came today when he had a ride available.  He denies fevers, chills, chest pain, shortness of breath, abdominal pain, constipation, diarrhea.  ED Course: Vital signs stable.  Lab workup included CMP with sodium 133, bicarb 14 with a gap of 20, BUN 90, creatinine elevated to 5.4 from baseline 1.4, glucose 144, calcium 9.4, protein 8.5, albumin 2.5, AST 59, ALT 55, ALP 231, T. bili 2.0.  CBC with leukocytosis to 17.6, hemoglobin stable 11, platelets 473.  PT and PTT and INR pending.  Troponin negative x 2.  BNP normal.  Lipase normal.  Respiratory panel for flu and COVID-negative.  Potassium mildly elevated at 2.7.  Ethanol level negative.  Urinalysis and ammonia level pending.  Pathologist smear review pending.  Chest x-ray showed no acute normality.  CT head showed no acute normality.  CT renal stone study showed large mass at the right hepatic lobe near the IVC with reactive adenopathy suspicious for malignancy.  Also noted was an inguinal hernia and cirrhosis.  Patient received a liter of fluids and started on a rate of fluids in ED.  Request EDP  discussed case with GI.  Review of Systems: As per HPI otherwise all other systems reviewed and are negative.  Past Medical History:  Diagnosis Date   Ascites    Chronic left hip pain    Cirrhosis ([place])    Hypertension    Stroke [facility])    TIA (transient ischemic attack) 06/23/2016    Past Surgical History:  Procedure Laterality Date   BACK SURGERY     CERVICAL SPINE SURGERY     ESOPHAGEAL BANDING N/A 01/25/2021   Procedure: ESOPHAGEAL BANDING;  Surgeon: Otis Brace, MD;  Location: [facility];  Service: Gastroenterology;  Laterality: N/A;   ESOPHAGOGASTRODUODENOSCOPY (EGD) WITH PROPOFOL N/A 01/25/2021   Procedure: ESOPHAGOGASTRODUODENOSCOPY (EGD) WITH PROPOFOL;  Surgeon: Otis Brace, MD;  Location: [facility];  Service: Gastroenterology;  Laterality: N/A;   ESOPHAGOGASTRODUODENOSCOPY (EGD) WITH PROPOFOL N/A 05/21/2021   Procedure: ESOPHAGOGASTRODUODENOSCOPY (EGD) WITH PROPOFOL;  Surgeon: Otis Brace, MD;  Location: [facility];  Service: Gastroenterology;  Laterality: N/A;   JOINT REPLACEMENT      Social History  reports that he has been smoking cigarettes. He has never used smokeless tobacco. He reports current alcohol use. He reports that he does not use drugs.  No Known Allergies  Family History  Family history unknown: Yes    Prior to Admission medications   Medication Sig Start Date End Date Taking? Authorizing Provider  allopurinol (ZYLOPRIM) 300 MG tablet Take 300 mg by mouth daily. 10/22/20   [provider]  amLODipine (NORVASC) 5  MG tablet Take 5 mg by mouth daily. 10/10/20   [provider]  buprenorphine (BUTRANS) 7.5 MCG/HR 1 patch once a week. 06/21/22   [provider]  diphenhydrAMINE (BENADRYL) 25 MG tablet Take 25 mg by mouth in the morning and at bedtime.    [provider]  folic acid (FOLVITE) 1 MG tablet Take 1 mg by mouth daily. 02/14/20   [provider]  furosemide (LASIX) 20 MG tablet Take 20  mg by mouth daily. 02/14/20   [provider]  hydrocortisone cream 1 % Apply 1 application topically 3 (three) times daily as needed for itching.    [provider]  lactulose (CHRONULAC) 10 GM/15ML solution Take 30 mLs (20 g total) by mouth 2 (two) times daily. 04/07/20   Elgergawy, Silver Huguenin, MD  nicotine (NICODERM CQ - DOSED IN MG/24 HOURS) 21 mg/24hr patch Place 1 patch (21 mg total) onto the skin daily. 04/08/20   Elgergawy, Silver Huguenin, MD  omeprazole (PRILOSEC OTC) 20 MG tablet Take 2 tablets (40 mg total) by mouth daily. 05/21/21 05/21/22  Otis Brace, MD  Oxycodone HCl 10 MG TABS Take 10 mg by mouth in the morning, at noon, and at bedtime.    [provider]  propranolol (INDERAL) 10 MG tablet Take 10 mg by mouth 2 (two) times daily. 07/02/22   [provider]  QUEtiapine (SEROQUEL) 25 MG tablet Take 1 tablet (25 mg total) by mouth at bedtime as needed (psychosis, confusion, agitaiton). Patient not taking: Reported on 05/17/2021 08/09/20 09/08/20  Terrilee Croak, MD  thiamine 100 MG tablet Take 1 tablet (100 mg total) by mouth daily. 04/08/20   Elgergawy, Silver Huguenin, MD  tiZANidine (ZANAFLEX) 4 MG tablet Take 4 mg by mouth 3 (three) times daily as needed for muscle spasms. 11/17/20   [provider]  Vitamin D, Ergocalciferol, (DRISDOL) 1.25 MG (50000 UNIT) CAPS capsule Take 50,000 Units by mouth every Sunday. 03/17/20   [provider]    Physical Exam: Vitals:   09/01/22 1415 09/01/22 1430 09/01/22 1445 09/01/22 1530  BP: 110/75 113/79 117/71 129/81  Pulse: 85 87 88 89  Resp: (!) '30 18 19 15  '$ Temp:      TempSrc:      SpO2: 98% 99% 100% 100%    Physical Exam Constitutional:      General: He is not in acute distress.    Appearance: Normal appearance.  HENT:     Head: Normocephalic and atraumatic.     Mouth/Throat:     Mouth: Mucous membranes are moist.     Pharynx: Oropharynx is clear.  Eyes:     Extraocular Movements:  Extraocular movements intact.     Pupils: Pupils are equal, round, and reactive to light.  Cardiovascular:     Rate and Rhythm: Normal rate and regular rhythm.     Pulses: Normal pulses.     Heart sounds: Normal heart sounds.  Pulmonary:     Effort: Pulmonary effort is normal. No respiratory distress.     Breath sounds: Normal breath sounds.  Abdominal:     General: Bowel sounds are normal. There is no distension.     Palpations: Abdomen is soft.     Tenderness: There is no abdominal tenderness.  Musculoskeletal:        General: No swelling or deformity.  Skin:    General: Skin is warm and dry.  Neurological:     Mental Status: Mental status is at baseline.  Comments: Difficult to understand speech.  Left lower facial droop.  Moves all 4 extremities spontaneously.    Labs on Admission: I have personally reviewed following labs and imaging studies  CBC: Recent Labs  Lab 09/01/22 0945  WBC 17.6*  NEUTROABS 13.2*  HGB 11.0*  HCT 31.5*  MCV 92.1  PLT 473*    Basic Metabolic Panel: Recent Labs  Lab 09/01/22 0945  NA 133*  K 4.2  CL 99  CO2 14*  GLUCOSE 144*  BUN 90*  CREATININE 5.40*  CALCIUM 9.4  MG 2.7*    GFR: CrCl cannot be calculated (Unknown ideal weight.).  Liver Function Tests: Recent Labs  Lab 09/01/22 0945  AST 59*  ALT 55*  ALKPHOS 231*  BILITOT 2.0*  PROT 8.8*  ALBUMIN 2.5*    Urine analysis:    Component Value Date/Time   COLORURINE YELLOW 08/06/2020 1021   APPEARANCEUR CLOUDY (A) 08/06/2020 1021   LABSPEC 1.017 08/06/2020 1021   PHURINE 5.0 08/06/2020 1021   GLUCOSEU NEGATIVE 08/06/2020 1021   HGBUR MODERATE (A) 08/06/2020 1021   BILIRUBINUR NEGATIVE 08/06/2020 1021   KETONESUR NEGATIVE 08/06/2020 1021   PROTEINUR 100 (A) 08/06/2020 1021   UROBILINOGEN 1.0 02/13/2010 1843   NITRITE POSITIVE (A) 08/06/2020 1021   LEUKOCYTESUR LARGE (A) 08/06/2020 1021    Radiological Exams on Admission: CT Renal Stone Study  Result Date:  09/01/2022 CLINICAL DATA:  Abdominal and flank pain EXAM: CT ABDOMEN AND PELVIS WITHOUT CONTRAST TECHNIQUE: Multidetector CT imaging of the abdomen and pelvis was performed following the standard protocol without IV contrast. RADIATION DOSE REDUCTION: This exam was performed according to the departmental dose-optimization program which includes automated exposure control, adjustment of the mA and/or kV according to patient size and/or use of iterative reconstruction technique. COMPARISON:  Abdominal ultrasound 12/04/2021 and CT abdomen 07/27/2020 FINDINGS: Lower chest: Paraseptal emphysema. Left anterior descending and right coronary artery atherosclerosis. Bilateral gynecomastia. Right paraesophageal node 2.1 cm in short axis on image 13 series 3, new compared to the prior exam. Hepatobiliary: Indistinct mass density within and along the medial margin the right hepatic lobe substantially abuts the right kidney upper pole. I suspect that this is probably arising from the liver given the degree of hepatic heterogeneity which may represent other lesions in the right hepatic lobe and segment 4. Nodular liver contour possibly from cirrhosis or pseudo cirrhosis. Underlying mass difficult to measure but potentially around 10 cm in diameter. Right adrenal gland indistinguishable from the mass. The dominant mass likewise substantially abuts the IVC and may extend in the right porta hepatis region adjacent to the pancreatic head. 1.9 cm gallstone in the gallbladder. Pancreas: As noted above there is indistinctness of fat planes around the pancreatic head possibly due to extension of the hepatic mass in this vicinity, or local adenopathy. A pancreatic primary malignancy is not excluded. Spleen: Unremarkable Adrenals/Urinary Tract: The right adrenal gland is surrounded by indistinct density favoring tumor. Left adrenal gland unremarkable. Right kidney upper pole abutted by the right mass extending along the hepatic renal  space. Nonobstructive 0.5 cm right kidney lower pole calculus. Urinary bladder unremarkable. Stomach/Bowel: Direct right inguinal hernia containing a loop of bowel with an air-level and possibly a small amount of ascites. No dilated bowel extending into this hernia to suggest obstruction. No pneumatosis. Vascular/Lymphatic: The mass along the right hepatic lobe is substantially in the vicinity of the IVC and may be exerting mass effect on the IVC. There appears to be porta hepatis and peripancreatic  adenopathy, including a 2.8 cm portacaval lymph node and a 2.2 cm peripancreatic node on images 37 and 24 of series 3, respectively. Right retrocrural node 1.1 cm in short axis, image 19 series 3. Aortocaval node 1.6 cm in short axis, image 42 series 3. Separate aortocaval node 2.3 cm in short axis, image 50 series 3. Atherosclerosis is present, including aortoiliac atherosclerotic disease. Reproductive: Unremarkable Other: Trace edema along the right paracolic gutter. Musculoskeletal: Left total hip prosthesis introducing streak artifact. 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 In conjunction with lumbar spondylosis and degenerative disc disease there is substantial bilateral foraminal impingement at L3-4, L4-5, and L5-S1. Old posterior fractures the right eleventh and twelfth ribs. IMPRESSION: 1. Large mass along the medial margin of the right hepatic lobe substantially in the vicinity of the IVC and right adrenal gland, probably arising from the liver given the degree of heterogeneity of the liver parenchyma. There is pathologic porta hepatis, peripancreatic, retrocrural, lower thoracic paraesophageal, and retroperitoneal adenopathy. The appearance is highly suspicious for malignancy, probably hepatocellular carcinoma with extrahepatic extension given the underlying cirrhosis, although other malignancies are not excluded. The  dominant mass angles the left adrenal gland and substantially abuts the IVC and right kidney upper pole. Oncology referral and tissue diagnosis is recommended. Comprehensive staging imaging such as nuclear medicine PET-CT recommended. Patency of the IVC and portal vein could be affected by this mass, but not assessed on noncontrast CT. 2. Direct right inguinal hernia containing a loop of bowel with an air-level and possibly a small amount of ascites. No dilated bowel extending into this hernia to suggest obstruction. 3. Nodular liver contour possibly from cirrhosis or less likely, pseudo-cirrhosis. 4. Other imaging findings of potential clinical significance: Coronary atherosclerosis. Paraseptal emphysema. Cholelithiasis. Nonobstructive right kidney lower pole calculus. 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 Lumbar spondylosis and degenerative disc disease causing substantial bilateral foraminal impingement at L3-4, L4-5, and L5-S1. Old posterior fractures the right eleventh and twelfth ribs. Aortic and systemic atherosclerosis. Aortic Atherosclerosis (ICD10-I70.0) and Emphysema (ICD10-J43.9). Electronically Signed   By: Van Clines M.D.   On: 09/01/2022 15:36   CT Head Wo Contrast  Result Date: 09/01/2022 CLINICAL DATA:  Mental status changes, weakness EXAM: CT HEAD WITHOUT CONTRAST TECHNIQUE: Contiguous axial images were obtained from the base of the skull through the vertex without intravenous contrast. RADIATION DOSE REDUCTION: This exam was performed according to the departmental dose-optimization program which includes automated exposure control, adjustment of the mA and/or kV according to patient size and/or use of iterative reconstruction technique. COMPARISON:  07/27/2020 FINDINGS: Brain: Stable mild atrophy pattern without acute intracranial hemorrhage, mass lesion, new infarction, midline shift, herniation, hydrocephalus, or extra-axial fluid collection. No  focal mass effect or edema. Cisterns are patent. No cerebellar abnormality. Vascular: No hyperdense vessel or unexpected calcification. Skull: Normal. Negative for fracture or focal lesion. Sinuses/Orbits: Clear sinuses. No acute orbital abnormality. Chronic calcifications of the right globe. Remote right orbital fracture medially. Other: None. IMPRESSION: Stable mild atrophy pattern. No acute intracranial abnormality by noncontrast CT. No interval change. Electronically Signed   By: Jerilynn Mages.  Shick M.D.   On: 09/01/2022 11:00   DG Chest 2 View  Result Date: 09/01/2022 CLINICAL DATA:  Weakness, shortness of breath. EXAM: CHEST - 2 VIEW COMPARISON:  Chest radiographs dated August 05, 2020 FINDINGS: The heart size and mediastinal contours  are within normal limits. Both lungs are clear. Moderate bilateral acromioclavicular osteoarthritis. Mild thoracic spondylosis. IMPRESSION: 1. No active cardiopulmonary disease. 2. Moderate bilateral acromioclavicular osteoarthritis. Electronically Signed   By: Keane Police D.O.   On: 09/01/2022 10:24    EKG: Independently reviewed.  Sinus rhythm at 88 bpm.  Borderline LVH.  Nonspecific T wave changes.  Assessment/Plan Principal Problem:   AKI (acute kidney injury) ([place]) Active Problems:   Essential hypertension   Hyperlipidemia   Chronic hepatitis C without hepatic coma (HCC)   Anemia of chronic disease   Liver mass   Uremia   High anion gap metabolic acidosis   AKI Uremia High anion gap metabolic acidosis > Patient presenting with weakness and falls and decreased p.o. intake. > Noted to have creatinine of 5.4 from baseline at 1.4 in the ED.  Also noted to have BUN of 90 which may be ileus partially explain mild confusion.  Bicarb of 14 with a gap of 20 likely secondary to the uremia. > Most likely represents prerenal given presenting story.  Not suspicious for hepatorenal syndrome at this time given overall picture and blood pressure. > Received a liter of IV  fluids in the ED and started on a rate. - Monitor on telemetry - Continue with IV fluids overnight - Trend renal function and electrolytes  Liver mass > Workup in the ED included CT scan which showed large liver mass at the right hepatic lobe near the IVC.  Reactive adenopathy also noted with read as suspicious for malignancy. > Will likely benefit from initiation of workup while here.  Have asked EDP to discuss the findings with patient's GI team. - Appreciate GI recommendations - AFP  Facial droop History of CVA > Patient reporting new facial droop. > Reports history of prior stroke with some dysarthria that had improved, and was not significantly present a couple of months ago per relative.  Facial droop is new.  Likely new malignancy could increase risk of hypercoagulability. > CT head without acute abnormality. - MRI brain - Further workup pending result of MRI  Leukocytosis > Significant white count of 17.6 in the ED.  No evidence of infection on chest x-ray, CT renal stone study.  Urinalysis pending. > At this time believed to be largely reactive partially hemoconcentration.  Will continue to trend. - Follow-up urinalysis - Trend CBC  Anemia > Hemoglobin stable at 11 - Trend CBC  Cirrhosis > History of chronic hepatitis C.  On Lasix, propranolol, lactulose outpatient. - Appreciate GI recommendations - Holding Lasix in the setting of dehydration - Continue home propranolol and lactulose  Hypertension - Holding home amlodipine and Lasix  DVT prophylaxis: Heparin Code Status:   DNR Family Communication:  Updated by phone.  Disposition Plan:   Patient is from:  Home  Anticipated DC to:  Home  Anticipated DC date:  1 to 5 days  Anticipated DC barriers: None  Consults called:  GI Admission status:  Observation, telemetry  Severity of Illness: The appropriate patient status for this patient is OBSERVATION. Observation status is judged to be reasonable and necessary  in order to provide the required intensity of service to ensure the patient's safety. The patient's presenting symptoms, physical exam findings, and initial radiographic and laboratory data in the context of their medical condition is felt to place them at decreased risk for further clinical deterioration. Furthermore, it is anticipated that the patient will be medically stable for discharge from the hospital within 2 midnights of admission.  Marcelyn Bruins MD [facility]  How to contact the [facility] Attending or Consulting provider [place] or covering provider during after hours [place], for this patient?   Check the care team in [facility] and look for a) attending/consulting [facility] provider listed and b) the [facility] team listed Log into [email or web address] and use [place]'s universal password to access. If you do not have the password, please contact the hospital operator. Locate the [facility] provider you are looking for under [facility] and page to a number that you can be directly reached. If you still have difficulty reaching the provider, please page the [facility] (Director on Call) for the Hospitalists listed on amion for assistance.  09/01/2022, 5:24 PM

## 2022-09-01 NOTE — ED Provider Notes (Signed)
[facility] EMERGENCY DEPARTMENT Provider Note   CSN: [id number] Arrival date & time: 09/01/22  5885     History  Chief Complaint  Patient presents with   Fall   Weakness    Calvin [name] is a 61 y.o. male.  The history is provided by the patient. The history is limited by the condition of the patient. No language interpreter was used.  Fall  Weakness 61 year old male with history of alcoholic cirrhosis, chronic hepatitis C, tobacco use, TIA, HTN, HLD, hypercalcemia presenting for generalized weakness.  Patient reports that he has been feeling generally weak since Friday 3 days ago.  Per triage note, he has had some RLQ abdominal pain and decreased appetite with nausea and vomiting, also had 6 falls over the weekend.  He also reports that he has had some difficulties with speech and facial droop starting 3 days ago, concerned about stroke as he has had this in the past.  He states he did not go to the ED when symptoms started due to not having transportation, he was brought in today by family member who is not present currently.  He reports his last drink was yesterday, had 2 beers.  Currently smokes 3 cigarettes/day.  On further chart review, he was admitted for TIA in 2017.  Apparently has baseline dysarthria but no facial droop was noted at that time.  He is blind in the right eye.  His most recent MRI of the brain was 07/2020 which was unremarkable.    Home Medications Prior to Admission medications   Medication Sig Start Date End Date Taking? Authorizing Provider  allopurinol (ZYLOPRIM) 300 MG tablet Take 300 mg by mouth daily. 10/22/20   [provider]  amLODipine (NORVASC) 5 MG tablet Take 5 mg by mouth daily. 10/10/20   [provider]  buprenorphine (BUTRANS) 7.5 MCG/HR 1 patch once a week. 06/21/22   [provider]  diphenhydrAMINE (BENADRYL) 25 MG tablet Take 25 mg by mouth in the morning and at bedtime.    [provider]  folic acid (FOLVITE) 1 MG tablet Take 1 mg by mouth daily. 02/14/20   [provider]  furosemide (LASIX) 20 MG tablet Take 20 mg by mouth daily. 02/14/20   [provider]  hydrocortisone cream 1 % Apply 1 application topically 3 (three) times daily as needed for itching.    [provider]  lactulose (CHRONULAC) 10 GM/15ML solution Take 30 mLs (20 g total) by mouth 2 (two) times daily. 04/07/20   Elgergawy, Silver Huguenin, MD  nicotine (NICODERM CQ - DOSED IN MG/24 HOURS) 21 mg/24hr patch Place 1 patch (21 mg total) onto the skin daily. 04/08/20   Elgergawy, Silver Huguenin, MD  omeprazole (PRILOSEC OTC) 20 MG tablet Take 2 tablets (40 mg total) by mouth daily. 05/21/21 05/21/22  Otis Brace, MD  Oxycodone HCl 10 MG TABS Take 10 mg by mouth in the morning, at noon, and at bedtime.    [provider]  propranolol (INDERAL) 10 MG tablet Take 10 mg by mouth 2 (two) times daily. 07/02/22   [provider]  QUEtiapine (SEROQUEL) 25 MG tablet Take 1 tablet (25 mg total) by mouth at bedtime as needed (psychosis, confusion, agitaiton). Patient not taking: Reported on 05/17/2021 08/09/20 09/08/20  Terrilee Croak, MD  thiamine 100 MG tablet Take 1 tablet (100 mg total) by mouth daily. 04/08/20   Elgergawy, Silver Huguenin, MD  tiZANidine (ZANAFLEX) 4 MG tablet Take 4 mg  by mouth 3 (three) times daily as needed for muscle spasms. 11/17/20   [provider]  Vitamin D, Ergocalciferol, (DRISDOL) 1.25 MG (50000 UNIT) CAPS capsule Take 50,000 Units by mouth every Sunday. 03/17/20   [provider]      Allergies    Patient has no known allergies.    Review of Systems   Review of Systems  Neurological:  Positive for weakness.    Physical Exam Updated Vital Signs BP 129/81   Pulse 89   Temp 98.5 F (36.9 C) (Oral)   Resp 15   SpO2 100%  Physical Exam Constitutional:      General: He is not in acute distress. HENT:     Head: Normocephalic and  atraumatic.     Mouth/Throat:     Comments: Mucous membranes somewhat dry Eyes:     Extraocular Movements: Extraocular movements intact.     Pupils: Pupils are equal, round, and reactive to light.  Cardiovascular:     Rate and Rhythm: Normal rate and regular rhythm.  Pulmonary:     Effort: Pulmonary effort is normal. No respiratory distress.     Breath sounds: Normal breath sounds.  Abdominal:     General: Bowel sounds are normal. There is distension.     Palpations: Abdomen is soft. There is hepatomegaly.     Tenderness: There is abdominal tenderness.     Comments: Mild tenderness to palpation RLQ and RUQ  Musculoskeletal:     Cervical back: Neck supple.  Neurological:     Mental Status: He is alert.     Comments: Alert, somewhat confused.  Able to state name but not location or year, stated year was 1919.  Follows commands mostly but left-sided facial droop noted.  Some inattention noted. Speech is dysarthric.  Otherwise CN II through XII intact.  Full strength in upper and lower extremities.    ED Results / Procedures / Treatments   Labs (all labs ordered are listed, but only abnormal results are displayed) Labs Reviewed  CBC WITH DIFFERENTIAL/PLATELET - Abnormal; Notable for the following components:      Result Value   WBC 17.6 (*)    RBC 3.42 (*)    Hemoglobin 11.0 (*)    HCT 31.5 (*)    Platelets 473 (*)    nRBC 9.1 (*)    Neutro Abs 13.2 (*)    Monocytes Absolute 1.9 (*)    Abs Immature Granulocytes 0.42 (*)    All other components within normal limits  COMPREHENSIVE METABOLIC PANEL - Abnormal; Notable for the following components:   Sodium 133 (*)    CO2 14 (*)    Glucose, Bld 144 (*)    BUN 90 (*)    Creatinine, Ser 5.40 (*)    Total Protein 8.8 (*)    Albumin 2.5 (*)    AST 59 (*)    ALT 55 (*)    Alkaline Phosphatase 231 (*)    Total Bilirubin 2.0 (*)    GFR, Estimated 11 (*)    Anion gap 20 (*)    All other components within normal limits  MAGNESIUM  - Abnormal; Notable for the following components:   Magnesium 2.7 (*)    All other components within normal limits  RESP PANEL BY RT-PCR (RSV, FLU A&B, COVID)  RVPGX2  LIPASE, BLOOD  BRAIN NATRIURETIC PEPTIDE  ETHANOL  PATHOLOGIST SMEAR REVIEW  URINALYSIS, ROUTINE W REFLEX MICROSCOPIC  TROPONIN I (HIGH SENSITIVITY)  TROPONIN I (HIGH SENSITIVITY)  EKG EKG Interpretation  Date/Time:  Monday September 01 2022 09:57:56 EST Ventricular Rate:  88 PR Interval:  132 QRS Duration: 92 QT Interval:  388 QTC Calculation: 469 R Axis:   12 Text Interpretation: Normal sinus rhythm Minimal voltage criteria for LVH, may be normal variant ( R in aVL ) Inferior infarct , age undetermined Abnormal ECG When compared with ECG of 08-Aug-2020 13:43, PREVIOUS ECG IS PRESENT Confirmed by Elnora Morrison [phone]) on 09/01/2022 1:33:54 PM  Radiology CT Renal Stone Study  Result Date: 09/01/2022 CLINICAL DATA:  Abdominal and flank pain EXAM: CT ABDOMEN AND PELVIS WITHOUT CONTRAST TECHNIQUE: Multidetector CT imaging of the abdomen and pelvis was performed following the standard protocol without IV contrast. RADIATION DOSE REDUCTION: This exam was performed according to the departmental dose-optimization program which includes automated exposure control, adjustment of the mA and/or kV according to patient size and/or use of iterative reconstruction technique. COMPARISON:  Abdominal ultrasound 12/04/2021 and CT abdomen 07/27/2020 FINDINGS: Lower chest: Paraseptal emphysema. Left anterior descending and right coronary artery atherosclerosis. Bilateral gynecomastia. Right paraesophageal node 2.1 cm in short axis on image 13 series 3, new compared to the prior exam. Hepatobiliary: Indistinct mass density within and along the medial margin the right hepatic lobe substantially abuts the right kidney upper pole. I suspect that this is probably arising from the liver given the degree of hepatic heterogeneity which may  represent other lesions in the right hepatic lobe and segment 4. Nodular liver contour possibly from cirrhosis or pseudo cirrhosis. Underlying mass difficult to measure but potentially around 10 cm in diameter. Right adrenal gland indistinguishable from the mass. The dominant mass likewise substantially abuts the IVC and may extend in the right porta hepatis region adjacent to the pancreatic head. 1.9 cm gallstone in the gallbladder. Pancreas: As noted above there is indistinctness of fat planes around the pancreatic head possibly due to extension of the hepatic mass in this vicinity, or local adenopathy. A pancreatic primary malignancy is not excluded. Spleen: Unremarkable Adrenals/Urinary Tract: The right adrenal gland is surrounded by indistinct density favoring tumor. Left adrenal gland unremarkable. Right kidney upper pole abutted by the right mass extending along the hepatic renal space. Nonobstructive 0.5 cm right kidney lower pole calculus. Urinary bladder unremarkable. Stomach/Bowel: Direct right inguinal hernia containing a loop of bowel with an air-level and possibly a small amount of ascites. No dilated bowel extending into this hernia to suggest obstruction. No pneumatosis. Vascular/Lymphatic: The mass along the right hepatic lobe is substantially in the vicinity of the IVC and may be exerting mass effect on the IVC. There appears to be porta hepatis and peripancreatic adenopathy, including a 2.8 cm portacaval lymph node and a 2.2 cm peripancreatic node on images 37 and 24 of series 3, respectively. Right retrocrural node 1.1 cm in short axis, image 19 series 3. Aortocaval node 1.6 cm in short axis, image 42 series 3. Separate aortocaval node 2.3 cm in short axis, image 50 series 3. Atherosclerosis is present, including aortoiliac atherosclerotic disease. Reproductive: Unremarkable Other: Trace edema along the right paracolic gutter. Musculoskeletal: Left total hip prosthesis introducing streak  artifact. 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 In conjunction with lumbar spondylosis and degenerative disc disease there is substantial bilateral foraminal impingement at L3-4, L4-5, and L5-S1. Old posterior fractures the right eleventh and twelfth ribs. IMPRESSION: 1. Large mass along the medial margin of the right  hepatic lobe substantially in the vicinity of the IVC and right adrenal gland, probably arising from the liver given the degree of heterogeneity of the liver parenchyma. There is pathologic porta hepatis, peripancreatic, retrocrural, lower thoracic paraesophageal, and retroperitoneal adenopathy. The appearance is highly suspicious for malignancy, probably hepatocellular carcinoma with extrahepatic extension given the underlying cirrhosis, although other malignancies are not excluded. The dominant mass angles the left adrenal gland and substantially abuts the IVC and right kidney upper pole. Oncology referral and tissue diagnosis is recommended. Comprehensive staging imaging such as nuclear medicine PET-CT recommended. Patency of the IVC and portal vein could be affected by this mass, but not assessed on noncontrast CT. 2. Direct right inguinal hernia containing a loop of bowel with an air-level and possibly a small amount of ascites. No dilated bowel extending into this hernia to suggest obstruction. 3. Nodular liver contour possibly from cirrhosis or less likely, pseudo-cirrhosis. 4. Other imaging findings of potential clinical significance: Coronary atherosclerosis. Paraseptal emphysema. Cholelithiasis. Nonobstructive right kidney lower pole calculus. 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 Lumbar spondylosis and degenerative disc disease causing substantial bilateral foraminal impingement at L3-4, L4-5, and L5-S1. Old posterior fractures the right eleventh and twelfth ribs.  Aortic and systemic atherosclerosis. Aortic Atherosclerosis (ICD10-I70.0) and Emphysema (ICD10-J43.9). Electronically Signed   By: Van Clines M.D.   On: 09/01/2022 15:36   CT Head Wo Contrast  Result Date: 09/01/2022 CLINICAL DATA:  Mental status changes, weakness EXAM: CT HEAD WITHOUT CONTRAST TECHNIQUE: Contiguous axial images were obtained from the base of the skull through the vertex without intravenous contrast. RADIATION DOSE REDUCTION: This exam was performed according to the departmental dose-optimization program which includes automated exposure control, adjustment of the mA and/or kV according to patient size and/or use of iterative reconstruction technique. COMPARISON:  07/27/2020 FINDINGS: Brain: Stable mild atrophy pattern without acute intracranial hemorrhage, mass lesion, new infarction, midline shift, herniation, hydrocephalus, or extra-axial fluid collection. No focal mass effect or edema. Cisterns are patent. No cerebellar abnormality. Vascular: No hyperdense vessel or unexpected calcification. Skull: Normal. Negative for fracture or focal lesion. Sinuses/Orbits: Clear sinuses. No acute orbital abnormality. Chronic calcifications of the right globe. Remote right orbital fracture medially. Other: None. IMPRESSION: Stable mild atrophy pattern. No acute intracranial abnormality by noncontrast CT. No interval change. Electronically Signed   By: Jerilynn Mages.  Shick M.D.   On: 09/01/2022 11:00   DG Chest 2 View  Result Date: 09/01/2022 CLINICAL DATA:  Weakness, shortness of breath. EXAM: CHEST - 2 VIEW COMPARISON:  Chest radiographs dated August 05, 2020 FINDINGS: The heart size and mediastinal contours are within normal limits. Both lungs are clear. Moderate bilateral acromioclavicular osteoarthritis. Mild thoracic spondylosis. IMPRESSION: 1. No active cardiopulmonary disease. 2. Moderate bilateral acromioclavicular osteoarthritis. Electronically Signed   By: Keane Police D.O.   On: 09/01/2022  10:24    Procedures Procedures    Medications Ordered in ED Medications  lactated ringers infusion (has no administration in time range)  lactated ringers bolus 1,000 mL (0 mLs Intravenous Stopped 09/01/22 1418)    ED Course/ Medical Decision Making/ A&P                           Medical Decision Making Amount and/or Complexity of Data Reviewed Labs: ordered. Radiology: ordered.  Risk Prescription drug management. Decision regarding hospitalization.   61 year old male with history of alcoholic cirrhosis, chronic hepatitis C, TIA, tobacco use, HTN,  HLD presenting with vague history of generalized weakness for 4 days with associated abdominal pain, nausea, vomiting.  VSS, not in any acute distress.  Abdomen is distended, mildly tender.  He is somewhat confused, does have notable dysarthria and left facial droop but otherwise no focal neurological deficits.  Unclear whether this is chronic or acute but do not see documented facial droop noted.  Labs obtained in triage notable for significantly elevated creatinine 5.40, leukocytosis, mild transaminitis, elevated ALP, anion gap metabolic acidosis, mild anemia consistent with baseline.  EKG with questionable T wave inversion in V3 but otherwise normal sinus rhythm.  CXR and CT head obtained in triage without any acute findings.  He has a significant AKI which is most likely prerenal in the setting of vomiting and decreased oral intake.  Given abdominal pain, CT abdomen renal stone protocol.  CT abdomen with finding of large liver mass, nonobstructive renal calculi, direct right inguinal hernia without obstruction, otherwise without acute findings and no obvious hydronephrosis visualized on independent review.  Patient will need to be admitted for significant AKI.  Baseline mental status unclear but he likely has a change in his mental status related to uremia.  Could also consider component of hepatic encephalopathy given history of  cirrhosis.  With new facial droop, he may have a subacute stroke though CT head is normal.  May need further workup with MRI.  May also need further workup for new liver mass.  Spoke with hospitalist who will admit patient, requesting GI consult.  Will pass on to oncoming team.  Final Clinical Impression(s) / ED Diagnoses Final diagnoses:  Acute renal failure, unspecified acute renal failure type [facility])  General weakness    Rx / DC Orders ED Discharge Orders     None         Zola Button, MD 09/01/22 1647    Elnora Morrison, MD 09/02/22 [phone]

## 2022-09-01 NOTE — ED Notes (Signed)
Pt bladder scanned with 29m in bladder. Pt urinated after bladder scan and amount was 77min bladder.

## 2022-09-01 NOTE — ED Triage Notes (Addendum)
Pt arrived POV from home stating he feels weaker than normal and that he had 6 falls over the weekend. Pt also endorses RLQ abdominal pain and states he has been unable to eat all weekend either.

## 2022-09-01 NOTE — Progress Notes (Signed)
Quintell, Bonnin [phone], Renne Musca [phone]

## 2022-09-01 NOTE — Progress Notes (Signed)
Patient arrived to room 6N24 from ED.  Assessment complete, VS obtained, and Admission database began.

## 2022-09-01 NOTE — ED Provider Triage Note (Addendum)
Emergency Medicine Provider Triage Evaluation Note  Calvin [name] , a 61 y.o. male  was evaluated in triage.  Pt complains of significant worsening weakness.  Known Hx of alcoholic cirrhosis with complications, anemia of chronic disease, chronic hep C, metabolic encephalopathy, CVA, TIA, and physical deconditioning.  Believes he fell 6 times this weekend due to his weakness.  Also reporting right-sided and centralized abdominal pain, with nausea and nonbilious vomiting.  Unsure whether there was blood in the vomit.  Denies fever or chest pain.  Denies hitting head or LOC.  Endorses chronic shortness of breath.  History limited due to condition of pt.  Review of Systems  Positive: See above Negative:   Physical Exam  BP 108/75 (BP Location: Left Arm)   Pulse 92   Temp 97.7 F (36.5 C)   Resp 14   SpO2 98%  Gen:   AAOx4.  Sitting in chair, somewhat ill appearing Resp:  Normal effort, equal chest rise.  Not tachypneic. MSK:   Moves extremities with some difficulty Other:  Abdomen mildly distended with right sided and centralized tenderness.  Clinically weak appearing.  No blood or vomitus appreciated in oropharynx.  Chest nonTTP, no crepitus. With some appreciated dysarthria, difficult discern whether this is at baseline though pt states this is.  Medical Decision Making  Medically screening exam initiated at 9:39 AM.  Appropriate orders placed.  Calvin [name] was informed that the remainder of the evaluation will be completed by another provider, this initial triage assessment does not replace that evaluation, and the importance of remaining in the ED until their evaluation is complete.  Discussed with triage nurse, plan to bring back to next available room.  Charge nurse notified pt to be brought back to next available room.   Prince Rome, PA-C 10/93/23 [id number]    Dorise Bullion [place], [place] 22/02/54 1121

## 2022-09-02 DIAGNOSIS — J90 Pleural effusion, not elsewhere classified: Secondary | ICD-10-CM | POA: Diagnosis not present

## 2022-09-02 DIAGNOSIS — K652 Spontaneous bacterial peritonitis: Secondary | ICD-10-CM | POA: Diagnosis not present

## 2022-09-02 DIAGNOSIS — N179 Acute kidney failure, unspecified: Secondary | ICD-10-CM | POA: Diagnosis not present

## 2022-09-02 DIAGNOSIS — I10 Essential (primary) hypertension: Secondary | ICD-10-CM | POA: Diagnosis not present

## 2022-09-02 DIAGNOSIS — R933 Abnormal findings on diagnostic imaging of other parts of digestive tract: Secondary | ICD-10-CM | POA: Diagnosis not present

## 2022-09-02 DIAGNOSIS — G9340 Encephalopathy, unspecified: Secondary | ICD-10-CM | POA: Diagnosis not present

## 2022-09-02 DIAGNOSIS — C229 Malignant neoplasm of liver, not specified as primary or secondary: Secondary | ICD-10-CM | POA: Diagnosis not present

## 2022-09-02 DIAGNOSIS — E279 Disorder of adrenal gland, unspecified: Secondary | ICD-10-CM | POA: Diagnosis not present

## 2022-09-02 DIAGNOSIS — I8500 Esophageal varices without bleeding: Secondary | ICD-10-CM | POA: Diagnosis not present

## 2022-09-02 DIAGNOSIS — Z20822 Contact with and (suspected) exposure to covid-19: Secondary | ICD-10-CM | POA: Diagnosis present

## 2022-09-02 DIAGNOSIS — C220 Liver cell carcinoma: Secondary | ICD-10-CM | POA: Diagnosis not present

## 2022-09-02 DIAGNOSIS — R471 Dysarthria and anarthria: Secondary | ICD-10-CM | POA: Diagnosis present

## 2022-09-02 DIAGNOSIS — K8689 Other specified diseases of pancreas: Secondary | ICD-10-CM | POA: Diagnosis not present

## 2022-09-02 DIAGNOSIS — H5461 Unqualified visual loss, right eye, normal vision left eye: Secondary | ICD-10-CM | POA: Diagnosis present

## 2022-09-02 DIAGNOSIS — I8510 Secondary esophageal varices without bleeding: Secondary | ICD-10-CM | POA: Diagnosis present

## 2022-09-02 DIAGNOSIS — N19 Unspecified kidney failure: Secondary | ICD-10-CM | POA: Diagnosis not present

## 2022-09-02 DIAGNOSIS — E8729 Other acidosis: Secondary | ICD-10-CM | POA: Diagnosis not present

## 2022-09-02 DIAGNOSIS — Z8673 Personal history of transient ischemic attack (TIA), and cerebral infarction without residual deficits: Secondary | ICD-10-CM | POA: Diagnosis not present

## 2022-09-02 DIAGNOSIS — R918 Other nonspecific abnormal finding of lung field: Secondary | ICD-10-CM | POA: Diagnosis not present

## 2022-09-02 DIAGNOSIS — G8929 Other chronic pain: Secondary | ICD-10-CM | POA: Diagnosis present

## 2022-09-02 DIAGNOSIS — Z66 Do not resuscitate: Secondary | ICD-10-CM | POA: Diagnosis present

## 2022-09-02 DIAGNOSIS — D72829 Elevated white blood cell count, unspecified: Secondary | ICD-10-CM | POA: Diagnosis not present

## 2022-09-02 DIAGNOSIS — R590 Localized enlarged lymph nodes: Secondary | ICD-10-CM | POA: Diagnosis present

## 2022-09-02 DIAGNOSIS — K769 Liver disease, unspecified: Secondary | ICD-10-CM | POA: Diagnosis not present

## 2022-09-02 DIAGNOSIS — B182 Chronic viral hepatitis C: Secondary | ICD-10-CM | POA: Diagnosis present

## 2022-09-02 DIAGNOSIS — E8721 Acute metabolic acidosis: Secondary | ICD-10-CM | POA: Diagnosis present

## 2022-09-02 DIAGNOSIS — Z7189 Other specified counseling: Secondary | ICD-10-CM | POA: Diagnosis not present

## 2022-09-02 DIAGNOSIS — J432 Centrilobular emphysema: Secondary | ICD-10-CM | POA: Diagnosis not present

## 2022-09-02 DIAGNOSIS — Z515 Encounter for palliative care: Secondary | ICD-10-CM | POA: Diagnosis not present

## 2022-09-02 DIAGNOSIS — R531 Weakness: Secondary | ICD-10-CM | POA: Diagnosis present

## 2022-09-02 DIAGNOSIS — E860 Dehydration: Secondary | ICD-10-CM | POA: Diagnosis not present

## 2022-09-02 DIAGNOSIS — K7460 Unspecified cirrhosis of liver: Secondary | ICD-10-CM | POA: Diagnosis not present

## 2022-09-02 DIAGNOSIS — K7031 Alcoholic cirrhosis of liver with ascites: Secondary | ICD-10-CM | POA: Diagnosis present

## 2022-09-02 DIAGNOSIS — K7689 Other specified diseases of liver: Secondary | ICD-10-CM | POA: Diagnosis not present

## 2022-09-02 DIAGNOSIS — R188 Other ascites: Secondary | ICD-10-CM | POA: Diagnosis not present

## 2022-09-02 DIAGNOSIS — F17210 Nicotine dependence, cigarettes, uncomplicated: Secondary | ICD-10-CM | POA: Diagnosis not present

## 2022-09-02 DIAGNOSIS — E785 Hyperlipidemia, unspecified: Secondary | ICD-10-CM | POA: Diagnosis present

## 2022-09-02 DIAGNOSIS — K4090 Unilateral inguinal hernia, without obstruction or gangrene, not specified as recurrent: Secondary | ICD-10-CM | POA: Diagnosis present

## 2022-09-02 DIAGNOSIS — D638 Anemia in other chronic diseases classified elsewhere: Secondary | ICD-10-CM | POA: Diagnosis not present

## 2022-09-02 DIAGNOSIS — R29810 Facial weakness: Secondary | ICD-10-CM | POA: Diagnosis present

## 2022-09-02 DIAGNOSIS — E876 Hypokalemia: Secondary | ICD-10-CM | POA: Diagnosis present

## 2022-09-02 DIAGNOSIS — K7682 Hepatic encephalopathy: Secondary | ICD-10-CM | POA: Diagnosis present

## 2022-09-02 DIAGNOSIS — R160 Hepatomegaly, not elsewhere classified: Secondary | ICD-10-CM | POA: Diagnosis not present

## 2022-09-02 DIAGNOSIS — K766 Portal hypertension: Secondary | ICD-10-CM | POA: Diagnosis present

## 2022-09-02 LAB — CBC
HCT: 28 % — ABNORMAL LOW (ref 39.0–52.0)
Hemoglobin: 10.1 g/dL — ABNORMAL LOW (ref 13.0–17.0)
MCH: 31.7 pg (ref 26.0–34.0)
MCHC: 36.1 g/dL — ABNORMAL HIGH (ref 30.0–36.0)
MCV: 87.8 fL (ref 80.0–100.0)
Platelets: 320 10*3/uL (ref 150–400)
RBC: 3.19 MIL/uL — ABNORMAL LOW (ref 4.22–5.81)
RDW: 14.7 % (ref 11.5–15.5)
WBC: 15.9 10*3/uL — ABNORMAL HIGH (ref 4.0–10.5)
nRBC: 3.7 % — ABNORMAL HIGH (ref 0.0–0.2)

## 2022-09-02 LAB — COMPREHENSIVE METABOLIC PANEL
ALT: 48 U/L — ABNORMAL HIGH (ref 0–44)
AST: 47 U/L — ABNORMAL HIGH (ref 15–41)
Albumin: 2.6 g/dL — ABNORMAL LOW (ref 3.5–5.0)
Alkaline Phosphatase: 208 U/L — ABNORMAL HIGH (ref 38–126)
Anion gap: 15 (ref 5–15)
BUN: 76 mg/dL — ABNORMAL HIGH (ref 8–23)
CO2: 18 mmol/L — ABNORMAL LOW (ref 22–32)
Calcium: 9.3 mg/dL (ref 8.9–10.3)
Chloride: 102 mmol/L (ref 98–111)
Creatinine, Ser: 3.25 mg/dL — ABNORMAL HIGH (ref 0.61–1.24)
GFR, Estimated: 21 mL/min — ABNORMAL LOW (ref >60)
Glucose, Bld: 118 mg/dL — ABNORMAL HIGH (ref 70–99)
Potassium: 3.4 mmol/L — ABNORMAL LOW (ref 3.5–5.1)
Sodium: 135 mmol/L (ref 135–145)
Total Bilirubin: 2 mg/dL — ABNORMAL HIGH (ref 0.3–1.2)
Total Protein: 7.7 g/dL (ref 6.5–8.1)

## 2022-09-02 LAB — PROTIME-INR
INR: 1.6 — ABNORMAL HIGH (ref 0.8–1.2)
Prothrombin Time: 18.7 seconds — ABNORMAL HIGH (ref 11.4–15.2)

## 2022-09-02 MED ORDER — LACTULOSE 10 GM/15ML PO SOLN
20.0000 g | Freq: Two times a day (BID) | ORAL | Status: DC
  Administered 2022-09-02 – 2022-09-08 (×13): 20 g via ORAL
  Filled 2022-09-02 (×13): qty 30

## 2022-09-02 MED ORDER — RIFAXIMIN 550 MG PO TABS
550.0000 mg | ORAL_TABLET | Freq: Two times a day (BID) | ORAL | Status: DC
  Administered 2022-09-02 – 2022-09-08 (×13): 550 mg via ORAL
  Filled 2022-09-02 (×13): qty 1

## 2022-09-02 MED ORDER — ALBUMIN HUMAN 25 % IV SOLN
75.0000 g | Freq: Once | INTRAVENOUS | Status: DC
  Filled 2022-09-02: qty 300

## 2022-09-02 MED ORDER — ALBUMIN HUMAN 25 % IV SOLN
100.0000 g | Freq: Once | INTRAVENOUS | Status: AC
  Administered 2022-09-03: 100 g via INTRAVENOUS
  Filled 2022-09-02: qty 400

## 2022-09-02 MED ORDER — ALBUMIN HUMAN 25 % IV SOLN: 100.0000 g | Freq: Once | INTRAVENOUS | Status: DC

## 2022-09-02 MED ORDER — POTASSIUM CHLORIDE CRYS ER 20 MEQ PO TBCR
40.0000 meq | EXTENDED_RELEASE_TABLET | Freq: Once | ORAL | Status: AC
  Administered 2022-09-02: 40 meq via ORAL
  Filled 2022-09-02: qty 2

## 2022-09-02 MED ORDER — ALBUMIN HUMAN 25 % IV SOLN
75.0000 g | Freq: Once | INTRAVENOUS | Status: AC
  Administered 2022-09-02: 75 g via INTRAVENOUS
  Filled 2022-09-02: qty 300

## 2022-09-02 MED ORDER — TIZANIDINE HCL 2 MG PO TABS
4.0000 mg | ORAL_TABLET | Freq: Three times a day (TID) | ORAL | Status: DC | PRN
  Administered 2022-09-02 – 2022-09-04 (×5): 4 mg via ORAL
  Filled 2022-09-02 (×5): qty 2

## 2022-09-02 NOTE — Progress Notes (Signed)
PROGRESS NOTE    Calvin [name]  TDD:[id number] DOB: 07/28/1961 DOA: 09/01/2022 PCP: Jenny Reichmann, PA-C   Brief Narrative:  HPI: Calvin [name] is a 61 y.o. male with medical history significant of hyperlipidemia, hypertension, anemia, alcohol use, TIA/stroke, cirrhosis, hepatitis C, right eye blindness presenting with weakness and fall.   Patient feeling generally weak the past several days and had about 6 falls over the weekend.  Reports decreased appetite with some nausea and vomiting as well.  Also reporting speech difficulty which is somewhat chronic for him and some facial droop for the past couple days which is new.  He became concerned for possible stroke as he has history of this.  States he would have gone to the ED earlier but he did not have a ride, unclear why he did not call EMS.  He came today when he had a ride available.   He denies fevers, chills, chest pain, shortness of breath, abdominal pain, constipation, diarrhea.   ED Course: Vital signs stable.  Lab workup included CMP with sodium 133, bicarb 14 with a gap of 20, BUN 90, creatinine elevated to 5.4 from baseline 1.4, glucose 144, calcium 9.4, protein 8.5, albumin 2.5, AST 59, ALT 55, ALP 231, T. bili 2.0.  CBC with leukocytosis to 17.6, hemoglobin stable 11, platelets 473.  PT and PTT and INR pending.  Troponin negative x 2.  BNP normal.  Lipase normal.  Respiratory panel for flu and COVID-negative.  Potassium mildly elevated at 2.7.  Ethanol level negative.  Urinalysis and ammonia level pending.  Pathologist smear review pending.  Chest x-ray showed no acute normality.  CT head showed no acute normality.  CT renal stone study showed large mass at the right hepatic lobe near the IVC with reactive adenopathy suspicious for malignancy.  Also noted was an inguinal hernia and cirrhosis.  Patient received a liter of fluids and started on a rate of fluids in ED.  Request EDP discussed case with GI.  Assessment & Plan:    Principal Problem:   AKI (acute kidney injury) ([place]) Active Problems:   Essential hypertension   Hyperlipidemia   Chronic hepatitis C without hepatic coma (HCC)   Anemia of chronic disease   Liver mass   Uremia   High anion gap metabolic acidosis   Leukocytosis  AKI / Uremia / High anion gap metabolic acidosis > Patient presenting with weakness and falls and decreased p.o. intake. > Noted to have creatinine of 5.4 from baseline at 1.4 in the ED.  Bicarb of 14 with a gap of 20 likely secondary to the uremia. Most likely represents prerenal given presenting story.  Not suspicious for hepatorenal syndrome at this time given overall picture and blood pressure. Received a liter of IV fluids in the ED and started on continuous IV fluids.  Creatinine is improving.  Down to 3.25 today.  CO2 is improving as well.  Anion gap closed.  Continue IV fluids.  Repeat labs in the morning.  Hypokalemia: Replace.   Liver mass > Workup in the ED included CT scan which showed large liver mass at the right hepatic lobe near the IVC.  Reactive adenopathy also noted with read as suspicious for malignancy.Will likely benefit from initiation of workup while here.  GI is consulted for further recommendations.  AFP in process.  Will also order CEA and CA 19.  Also has distended abdomen with possible some fluid shift and possible ascites.  Might need diagnostic paracentesis but will  defer to GI.   Facial droop History of CVA  Patient reported new facial droop. Reports history of prior stroke with some dysarthria that had improved, and was not significantly present a couple of months ago per relative.  Facial droop is new however during my evaluation this morning, I did not notice any facial droop and patient did not bring this up to me as well.  CT head as well as MRI brain negative for stroke.   Leukocytosis: Likely reactive, or hemoconcentration, or could be secondary to occult malignancy.  No signs of infection.    Anemia of chronic disease: Stable.   History of chronic hepatitis C.  PTA on Lasix, propranolol, lactulose.  Continue propranolol and lactulose but hold Lasix due to dehydration.   Essential hypertension: Blood pressure controlled despite of holding amlodipine and Lasix.  DVT prophylaxis: heparin injection 5,000 Units Start: 09/01/22 2200   Code Status: DNR  Family Communication:  None present at bedside.  Plan of care discussed with patient in length and he/she verbalized understanding and agreed with it.  Status is: Observation The patient will require care spanning > 2 midnights and should be moved to inpatient because: Patient will likely require further workup of hepatic mass.  Awaiting GI recommendations.   Estimated body mass index is 27.85 kg/m as calculated from the following:   Height as of 07/14/22: '5\' 7"'$  (1.702 m).   Weight as of 07/14/22: 80.6 kg.    Nutritional Assessment: There is no height or weight on file to calculate BMI.. Seen by dietician.  I agree with the assessment and plan as outlined below: Nutrition Status:        . Skin Assessment: I have examined the patient's skin and I agree with the wound assessment as performed by the wound care RN as outlined below:    Consultants:  GI  Procedures:  None  Antimicrobials:  Anti-infectives (From admission, onward)    None         Subjective: Patient seen and examined.  Complains of abdominal pain.  No other complaint.  Appears to have some cognitive deficit.  Objective: Vitals:   09/01/22 2000 09/01/22 2145 09/02/22 0455 09/02/22 0832  BP: 109/80 (!) 140/94 116/81 137/85  Pulse:  91 85 94  Resp: '17 18 18 17  '$ Temp:  (!) 97.4 F (36.3 C) 98 F (36.7 C) 97.6 F (36.4 C)  TempSrc:  Oral Oral Oral  SpO2:  97% 98% 100%    Intake/Output Summary (Last 24 hours) at 09/02/2022 1001 Last data filed at 09/02/2022 0000 Gross per 24 hour  Intake 877.34 ml  Output --  Net 877.34 ml   There  were no vitals filed for this visit.  Examination:  General exam: Appears calm and comfortable  Respiratory system: Clear to auscultation. Respiratory effort normal. Cardiovascular system: S1 & S2 heard, RRR. No JVD, murmurs, rubs, gallops or clicks. No pedal edema. Gastrointestinal system: Abdomen is soft, moderately distended and generalized tender no organomegaly or masses felt. Normal bowel sounds heard. Central nervous system: Alert and oriented. No focal neurological deficits. Extremities: Symmetric 5 x 5 power. Skin: No rashes, lesions or ulcers Psychiatry: Judgement and insight appear normal. Mood & affect appropriate.    Data Reviewed: I have personally reviewed following labs and imaging studies  CBC: Recent Labs  Lab 09/01/22 0945 09/02/22 0210  WBC 17.6* 15.9*  NEUTROABS 13.2*  --   HGB 11.0* 10.1*  HCT 31.5* 28.0*  MCV 92.1 87.8  PLT  473* 426   Basic Metabolic Panel: Recent Labs  Lab 09/01/22 0945 09/02/22 0210  NA 133* 135  K 4.2 3.4*  CL 99 102  CO2 14* 18*  GLUCOSE 144* 118*  BUN 90* 76*  CREATININE 5.40* 3.25*  CALCIUM 9.4 9.3  MG 2.7*  --    GFR: CrCl cannot be calculated (Unknown ideal weight.). Liver Function Tests: Recent Labs  Lab 09/01/22 0945 09/02/22 0210  AST 59* 47*  ALT 55* 48*  ALKPHOS 231* 208*  BILITOT 2.0* 2.0*  PROT 8.8* 7.7  ALBUMIN 2.5* 2.6*   Recent Labs  Lab 09/01/22 0945  LIPASE 42   Recent Labs  Lab 09/01/22 2121  AMMONIA 71*   Coagulation Profile: Recent Labs  Lab 09/01/22 2121 09/02/22 0210  INR 1.5* 1.6*   Cardiac Enzymes: No results for input(s): "CKTOTAL", "CKMB", "CKMBINDEX", "TROPONINI" in the last 168 hours. BNP (last 3 results) No results for input(s): "PROBNP" in the last 8760 hours. HbA1C: No results for input(s): "HGBA1C" in the last 72 hours. CBG: No results for input(s): "GLUCAP" in the last 168 hours. Lipid Profile: No results for input(s): "CHOL", "HDL", "LDLCALC", "TRIG",  "CHOLHDL", "LDLDIRECT" in the last 72 hours. Thyroid Function Tests: No results for input(s): "TSH", "T4TOTAL", "FREET4", "T3FREE", "THYROIDAB" in the last 72 hours. Anemia Panel: No results for input(s): "VITAMINB12", "FOLATE", "FERRITIN", "TIBC", "IRON", "RETICCTPCT" in the last 72 hours. Sepsis Labs: No results for input(s): "PROCALCITON", "LATICACIDVEN" in the last 168 hours.  Recent Results (from the past 240 hour(s))  Resp panel by RT-PCR (RSV, Flu A&B, Covid) Anterior Nasal Swab     Status: None   Collection Time: 09/01/22  1:49 PM   Specimen: Anterior Nasal Swab  Result Value Ref Range Status   SARS Coronavirus 2 by RT PCR NEGATIVE NEGATIVE Final    Comment: (NOTE) SARS-CoV-2 target nucleic acids are NOT DETECTED.  The SARS-CoV-2 RNA is generally detectable in upper respiratory specimens during the acute phase of infection. The lowest concentration of SARS-CoV-2 viral copies this assay can detect is 138 copies/mL. A negative result does not preclude SARS-Cov-2 infection and should not be used as the sole basis for treatment or other patient management decisions. A negative result may occur with  improper specimen collection/handling, submission of specimen other than nasopharyngeal swab, presence of viral mutation(s) within the areas targeted by this assay, and inadequate number of viral copies(<138 copies/mL). A negative result must be combined with clinical observations, patient history, and epidemiological information. The expected result is Negative.  Fact Sheet for Patients:  EntrepreneurPulse.com.au  Fact Sheet for Healthcare Providers:  IncredibleEmployment.be  This test is no t yet approved or cleared by the [place] FDA and  has been authorized for detection and/or diagnosis of SARS-CoV-2 by FDA under an Emergency Use Authorization (EUA). This EUA will remain  in effect (meaning this test can be used) for the duration of  the COVID-19 declaration under Section 564(b)(1) of the Act, 21 U.S.C.section 360bbb-3(b)(1), unless the authorization is terminated  or revoked sooner.       Influenza A by PCR NEGATIVE NEGATIVE Final   Influenza B by PCR NEGATIVE NEGATIVE Final    Comment: (NOTE) The Xpert Xpress SARS-CoV-2/FLU/RSV plus assay is intended as an aid in the diagnosis of influenza from Nasopharyngeal swab specimens and should not be used as a sole basis for treatment. Nasal washings and aspirates are unacceptable for Xpert Xpress SARS-CoV-2/FLU/RSV testing.  Fact Sheet for Patients: EntrepreneurPulse.com.au  Fact Sheet for Healthcare  Providers: IncredibleEmployment.be  This test is not yet approved or cleared by the [place] and has been authorized for detection and/or diagnosis of SARS-CoV-2 by FDA under an Emergency Use Authorization (EUA). This EUA will remain in effect (meaning this test can be used) for the duration of the COVID-19 declaration under Section 564(b)(1) of the Act, 21 U.S.C. section 360bbb-3(b)(1), unless the authorization is terminated or revoked.     Resp Syncytial Virus by PCR NEGATIVE NEGATIVE Final    Comment: (NOTE) Fact Sheet for Patients: EntrepreneurPulse.com.au  Fact Sheet for Healthcare Providers: IncredibleEmployment.be  This test is not yet approved or cleared by the [place] FDA and has been authorized for detection and/or diagnosis of SARS-CoV-2 by FDA under an Emergency Use Authorization (EUA). This EUA will remain in effect (meaning this test can be used) for the duration of the COVID-19 declaration under Section 564(b)(1) of the Act, 21 U.S.C. section 360bbb-3(b)(1), unless the authorization is terminated or revoked.  Performed at [facility], [place] [address] [place], [place] [address]      Radiology Studies: MR BRAIN WO CONTRAST  Result Date:  09/01/2022 CLINICAL DATA:  Acute neurologic deficit EXAM: MRI HEAD WITHOUT CONTRAST TECHNIQUE: Multiplanar, multiecho pulse sequences of the brain and surrounding structures were obtained without intravenous contrast. COMPARISON:  07/28/2020 FINDINGS: Brain: No acute infarct, mass effect or extra-axial collection. No acute or chronic hemorrhage. There is multifocal hyperintense T2-weighted signal within the white matter. Generalized volume loss. The midline structures are normal. Vascular: Major flow voids are preserved. Skull and upper cervical spine: Normal calvarium and skull base. Visualized upper cervical spine and soft tissues are normal. Sinuses/Orbits:No paranasal sinus fluid levels or advanced mucosal thickening. No mastoid or middle ear effusion. 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 No acute intracranial abnormality. 2. Findings of chronic small vessel ischemia and volume loss. Electronically Signed   By: Ulyses Jarred M.D.   On: 09/01/2022 18:54   CT Renal Stone Study  Result Date: 09/01/2022 CLINICAL DATA:  Abdominal and flank pain EXAM: CT ABDOMEN AND PELVIS WITHOUT CONTRAST TECHNIQUE: Multidetector CT imaging of the abdomen and pelvis was performed following the standard protocol without IV contrast. RADIATION DOSE REDUCTION: This exam was performed according to the departmental dose-optimization program which includes automated exposure control, adjustment of the mA and/or kV according to patient size and/or use of iterative reconstruction technique. COMPARISON:  Abdominal ultrasound 12/04/2021 and CT abdomen 07/27/2020 FINDINGS: Lower chest: Paraseptal emphysema. Left anterior descending and right coronary artery atherosclerosis. Bilateral gynecomastia. Right paraesophageal node 2.1 cm in short axis on image 13 series 3, new compared to the prior exam. Hepatobiliary: Indistinct mass density within and along the medial margin the right hepatic lobe  substantially abuts the right kidney upper pole. I suspect that this is probably arising from the liver given the degree of hepatic heterogeneity which may represent other lesions in the right hepatic lobe and segment 4. Nodular liver contour possibly from cirrhosis or pseudo cirrhosis. Underlying mass difficult to measure but potentially around 10 cm in diameter. Right adrenal gland indistinguishable from the mass. The dominant mass likewise substantially abuts the IVC and may extend in the right porta hepatis region adjacent to the pancreatic head. 1.9 cm gallstone in the gallbladder. Pancreas: As noted above there is indistinctness of fat planes around the pancreatic head possibly due to extension of the hepatic mass in this vicinity, or local adenopathy. A pancreatic primary malignancy is not excluded. Spleen:  Unremarkable Adrenals/Urinary Tract: The right adrenal gland is surrounded by indistinct density favoring tumor. Left adrenal gland unremarkable. Right kidney upper pole abutted by the right mass extending along the hepatic renal space. Nonobstructive 0.5 cm right kidney lower pole calculus. Urinary bladder unremarkable. Stomach/Bowel: Direct right inguinal hernia containing a loop of bowel with an air-level and possibly a small amount of ascites. No dilated bowel extending into this hernia to suggest obstruction. No pneumatosis. Vascular/Lymphatic: The mass along the right hepatic lobe is substantially in the vicinity of the IVC and may be exerting mass effect on the IVC. There appears to be porta hepatis and peripancreatic adenopathy, including a 2.8 cm portacaval lymph node and a 2.2 cm peripancreatic node on images 37 and 24 of series 3, respectively. Right retrocrural node 1.1 cm in short axis, image 19 series 3. Aortocaval node 1.6 cm in short axis, image 42 series 3. Separate aortocaval node 2.3 cm in short axis, image 50 series 3. Atherosclerosis is present, including aortoiliac atherosclerotic  disease. Reproductive: Unremarkable Other: Trace edema along the right paracolic gutter. Musculoskeletal: Left total hip prosthesis introducing streak artifact. 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 In conjunction with lumbar spondylosis and degenerative disc disease there is substantial bilateral foraminal impingement at L3-4, L4-5, and L5-S1. Old posterior fractures the right eleventh and twelfth ribs. IMPRESSION: 1. Large mass along the medial margin of the right hepatic lobe substantially in the vicinity of the IVC and right adrenal gland, probably arising from the liver given the degree of heterogeneity of the liver parenchyma. There is pathologic porta hepatis, peripancreatic, retrocrural, lower thoracic paraesophageal, and retroperitoneal adenopathy. The appearance is highly suspicious for malignancy, probably hepatocellular carcinoma with extrahepatic extension given the underlying cirrhosis, although other malignancies are not excluded. The dominant mass angles the left adrenal gland and substantially abuts the IVC and right kidney upper pole. Oncology referral and tissue diagnosis is recommended. Comprehensive staging imaging such as nuclear medicine PET-CT recommended. Patency of the IVC and portal vein could be affected by this mass, but not assessed on noncontrast CT. 2. Direct right inguinal hernia containing a loop of bowel with an air-level and possibly a small amount of ascites. No dilated bowel extending into this hernia to suggest obstruction. 3. Nodular liver contour possibly from cirrhosis or less likely, pseudo-cirrhosis. 4. Other imaging findings of potential clinical significance: Coronary atherosclerosis. Paraseptal emphysema. Cholelithiasis. Nonobstructive right kidney lower pole calculus. 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 Lumbar spondylosis and degenerative  disc disease causing substantial bilateral foraminal impingement at L3-4, L4-5, and L5-S1. Old posterior fractures the right eleventh and twelfth ribs. Aortic and systemic atherosclerosis. Aortic Atherosclerosis (ICD10-I70.0) and Emphysema (ICD10-J43.9). Electronically Signed   By: Van Clines M.D.   On: 09/01/2022 15:36   CT Head Wo Contrast  Result Date: 09/01/2022 CLINICAL DATA:  Mental status changes, weakness EXAM: CT HEAD WITHOUT CONTRAST TECHNIQUE: Contiguous axial images were obtained from the base of the skull through the vertex without intravenous contrast. RADIATION DOSE REDUCTION: This exam was performed according to the departmental dose-optimization program which includes automated exposure control, adjustment of the mA and/or kV according to patient size and/or use of iterative reconstruction technique. COMPARISON:  07/27/2020 FINDINGS: Brain: Stable mild atrophy pattern without acute intracranial hemorrhage, mass lesion, new infarction, midline shift, herniation, hydrocephalus, or extra-axial fluid collection. No focal mass effect  or edema. Cisterns are patent. No cerebellar abnormality. Vascular: No hyperdense vessel or unexpected calcification. Skull: Normal. Negative for fracture or focal lesion. Sinuses/Orbits: Clear sinuses. No acute orbital abnormality. Chronic calcifications of the right globe. Remote right orbital fracture medially. Other: None. IMPRESSION: Stable mild atrophy pattern. No acute intracranial abnormality by noncontrast CT. No interval change. Electronically Signed   By: Jerilynn Mages.  Shick M.D.   On: 09/01/2022 11:00   DG Chest 2 View  Result Date: 09/01/2022 CLINICAL DATA:  Weakness, shortness of breath. EXAM: CHEST - 2 VIEW COMPARISON:  Chest radiographs dated August 05, 2020 FINDINGS: The heart size and mediastinal contours are within normal limits. Both lungs are clear. Moderate bilateral acromioclavicular osteoarthritis. Mild thoracic spondylosis. IMPRESSION: 1.  No active cardiopulmonary disease. 2. Moderate bilateral acromioclavicular osteoarthritis. Electronically Signed   By: Keane Police D.O.   On: 09/01/2022 10:24    Scheduled Meds:  folic acid  1 mg Oral Daily   heparin  5,000 Units Subcutaneous Q8H   lactulose  20 g Oral BID   pantoprazole  40 mg Oral BID   propranolol  10 mg Oral BID   sodium chloride flush  3 mL Intravenous Q12H   thiamine  100 mg Oral Daily   Continuous Infusions:  lactated ringers 125 mL/hr at 09/02/22 0257     LOS: 0 days   Darliss Cheney, MD [facility]  09/02/2022, 10:01 AM   *Please note that this is a verbal dictation therefore any spelling or grammatical errors are due to the "[place] [employer]" system interpretation.  Please page via [place] and do not message via secure chat for urgent patient care matters. Secure chat can be used for non urgent patient care matters.  How to contact the [facility] Attending or Consulting provider [place] or covering provider during after hours [place], for this patient?  Check the care team in [facility] and look for a) attending/consulting [facility] provider listed and b) the [facility] team listed. Page or secure chat [phone]. Log into [email or web address] and use [place]'s universal password to access. If you do not have the password, please contact the hospital operator. Locate the [facility] provider you are looking for under [facility] and page to a number that you can be directly reached. If you still have difficulty reaching the provider, please page the [facility] (Director on Call) for the Hospitalists listed on amion for assistance.

## 2022-09-02 NOTE — Consult Note (Signed)
Referring Provider:  [facility] Primary Care Physician:  Jenny Reichmann, PA-C Primary Gastroenterologist:  Dr. Alessandra Bevels  Reason for Consultation: Cirrhosis, abnormal CT concerning for [facility]  HPI: Calvin [name] is a 61 y.o. male with past medical history of cirrhosis from prior alcohol use and prior hepatitis C admitted to the hospital with weakness.  Patient was seen in the office in October 2023 for routine follow-up.  Blood work at that time showed elevated AFP at 34, alkaline phosphatase of 191 otherwise normal LFTs.  CT abdomen triple phase liver protocol was ordered as well as repeat blood work but patient was no-show for blood work and has not scheduled his CT scan.  Was also found to have acute kidney injury with creatinine of more than 5.  Creatinine improved to 3.25 today.  Of note, patient had multiple ultrasound done every 6 months which  did not showed any evidence of liver lesion.  Also CT renal stone protocol in November 2021 did not showed any liver lesion.  Patient seen and examined at bedside.  His brother is at bedside.  Patient is on and off confusion.  According to brother, confusion and weakness started few days ago.  Denies any blood in the stool or black stool.  He did not drink alcohol this weekend but usually drinks small amount of alcohol during weekends.   Review of GI workup  Ultrasound in March 2023 showed cirrhosis and  no mass lesion.  History of hepatitis C - status post treatment with Epclusa which he completed in April 2021.Negative viral load in June 2021. Was last in office in Dec 2022.  EGD in May 2022 showed large esophageal varices and 2 bands were placed.   Repeat EGD in August 2022 showed small esophageal varices and gastritis. Did not require any band ligation. He was started on PPI. I have recommended repeat EGD in one year for variceal surveillance.          Previous GI history        Blood work in June 2021 showed elevated creatinine and was  advised to stop diuretics. He was also found to mild anemia with hemoglobin of around 10.7. Repeat blood work in August 2021 showed improvement in creatinine to around 1.17. She was subsequently admitted to the hospital in November 2021 with acute kidney injury with creatinine of around 8 and encephalopathy. He was also diagnosed with Escherichia coli bacteremia.         He was admitted to the hospital in October 2020 with abdominal pain and abdominal distention. CT abdomen pelvis without contrast showed questionable changes of cirrhosis as well as ascites. He was seen by Dr. Ardis Hughs. Patient with history of heavy alcohol use at that time. Subsequently he was diagnosed with Alcoholic hepatitis and was started on prednisone. Blood work at that time showed positive HCV PCR and genotype 1A. He underwent paracentesis on July 16, 2019 with removal of 1.5 L of fluid. Negative for SBP. Negative cytology. Patient was also discharged home on spironolactone 100 mg and Lasix 40 mg along with lactulose.  Past Medical History:  Diagnosis Date   Ascites    Chronic left hip pain    Cirrhosis ([place])    Hypertension    Stroke [facility])    TIA (transient ischemic attack) 06/23/2016    Past Surgical History:  Procedure Laterality Date   BACK SURGERY     CERVICAL SPINE SURGERY     ESOPHAGEAL BANDING N/A 01/25/2021   Procedure: ESOPHAGEAL  BANDING;  Surgeon: Otis Brace, MD;  Location: [facility];  Service: Gastroenterology;  Laterality: N/A;   ESOPHAGOGASTRODUODENOSCOPY (EGD) WITH PROPOFOL N/A 01/25/2021   Procedure: ESOPHAGOGASTRODUODENOSCOPY (EGD) WITH PROPOFOL;  Surgeon: Otis Brace, MD;  Location: [facility];  Service: Gastroenterology;  Laterality: N/A;   ESOPHAGOGASTRODUODENOSCOPY (EGD) WITH PROPOFOL N/A 05/21/2021   Procedure: ESOPHAGOGASTRODUODENOSCOPY (EGD) WITH PROPOFOL;  Surgeon: Otis Brace, MD;  Location: [facility];  Service: Gastroenterology;  Laterality: N/A;   JOINT REPLACEMENT       Prior to Admission medications   Medication Sig Start Date End Date Taking? Authorizing Provider  allopurinol (ZYLOPRIM) 300 MG tablet Take 300 mg by mouth daily. 10/22/20   [provider]  amLODipine (NORVASC) 5 MG tablet Take 5 mg by mouth daily. 10/10/20   [provider]  buprenorphine (BUTRANS) 7.5 MCG/HR 1 patch once a week. 06/21/22   [provider]  diphenhydrAMINE (BENADRYL) 25 MG tablet Take 25 mg by mouth in the morning and at bedtime.    [provider]  folic acid (FOLVITE) 1 MG tablet Take 1 mg by mouth daily. 02/14/20   [provider]  furosemide (LASIX) 20 MG tablet Take 20 mg by mouth daily. 02/14/20   [provider]  hydrocortisone cream 1 % Apply 1 application topically 3 (three) times daily as needed for itching.    [provider]  lactulose (CHRONULAC) 10 GM/15ML solution Take 30 mLs (20 g total) by mouth 2 (two) times daily. 04/07/20   Elgergawy, Silver Huguenin, MD  nicotine (NICODERM CQ - DOSED IN MG/24 HOURS) 21 mg/24hr patch Place 1 patch (21 mg total) onto the skin daily. 04/08/20   Elgergawy, Silver Huguenin, MD  omeprazole (PRILOSEC OTC) 20 MG tablet Take 2 tablets (40 mg total) by mouth daily. 05/21/21 05/21/22  Otis Brace, MD  Oxycodone HCl 10 MG TABS Take 10 mg by mouth in the morning, at noon, and at bedtime.    [provider]  propranolol (INDERAL) 10 MG tablet Take 10 mg by mouth 2 (two) times daily. 07/02/22   [provider]  QUEtiapine (SEROQUEL) 25 MG tablet Take 1 tablet (25 mg total) by mouth at bedtime as needed (psychosis, confusion, agitaiton). Patient not taking: Reported on 05/17/2021 08/09/20 09/08/20  Terrilee Croak, MD  thiamine 100 MG tablet Take 1 tablet (100 mg total) by mouth daily. 04/08/20   Elgergawy, Silver Huguenin, MD  tiZANidine (ZANAFLEX) 4 MG tablet Take 4 mg by mouth 3 (three) times daily as needed for muscle spasms. 11/17/20   [provider]  Vitamin D,  Ergocalciferol, (DRISDOL) 1.25 MG (50000 UNIT) CAPS capsule Take 50,000 Units by mouth every Sunday. 03/17/20   [provider]    Scheduled Meds:  folic acid  1 mg Oral Daily   heparin  5,000 Units Subcutaneous Q8H   lactulose  20 g Oral BID   pantoprazole  40 mg Oral BID   propranolol  10 mg Oral BID   sodium chloride flush  3 mL Intravenous Q12H   thiamine  100 mg Oral Daily   Continuous Infusions:  lactated ringers 125 mL/hr at 09/02/22 0257   PRN Meds:.acetaminophen **OR** acetaminophen, polyethylene glycol, tiZANidine  Allergies as of 09/01/2022   (No Known Allergies)    Family History  Family history unknown: Yes    Social History   Socioeconomic History   Marital status: Married    Spouse name: Not on file   Number of children: Not on file   Years  of education: Not on file   Highest education level: Not on file  Occupational History   Not on file  Tobacco Use   Smoking status: Every Day    Types: Cigarettes   Smokeless tobacco: Never   Tobacco comments:    1/2 pack per day  Substance and Sexual Activity   Alcohol use: Yes    Comment: 2 beers/day   Drug use: No   Sexual activity: Not on file  Other Topics Concern   Not on file  Social History Narrative   Not on file   Social Determinants of Health   Financial Resource Strain: Not on file  Food Insecurity: Not on file  Transportation Needs: Not on file  Physical Activity: Not on file  Stress: Not on file  Social Connections: Not on file  Intimate Partner Violence: Not on file    Review of Systems: All negative except as stated above in HPI.  Physical Exam: Vital signs: Vitals:   09/02/22 0455 09/02/22 0832  BP: 116/81 137/85  Pulse: 85 94  Resp: 18 17  Temp: 98 F (36.7 C) 97.6 F (36.4 C)  SpO2: 98% 100%     General:   Alert but somewhat confused Lungs: No visible respiratory distress Heart:  Regular rate and rhythm; no murmurs, clicks, rubs,  or gallops. Abdomen: Abdomen  is mildly distended but otherwise soft, bowel sounds present, no peritoneal signs Neuro -lethargic and confused Rectal:  Deferred  GI:  Lab Results: Recent Labs    09/01/22 0945 09/02/22 0210  WBC 17.6* 15.9*  HGB 11.0* 10.1*  HCT 31.5* 28.0*  PLT 473* 320   BMET Recent Labs    09/01/22 0945 09/02/22 0210  NA 133* 135  K 4.2 3.4*  CL 99 102  CO2 14* 18*  GLUCOSE 144* 118*  BUN 90* 76*  CREATININE 5.40* 3.25*  CALCIUM 9.4 9.3   LFT Recent Labs    09/02/22 0210  PROT 7.7  ALBUMIN 2.6*  AST 47*  ALT 48*  ALKPHOS 208*  BILITOT 2.0*   PT/INR Recent Labs    09/01/22 2121 09/02/22 0210  LABPROT 18.1* 18.7*  INR 1.5* 1.6*     Studies/Results: MR BRAIN WO CONTRAST  Result Date: 09/01/2022 CLINICAL DATA:  Acute neurologic deficit EXAM: MRI HEAD WITHOUT CONTRAST TECHNIQUE: Multiplanar, multiecho pulse sequences of the brain and surrounding structures were obtained without intravenous contrast. COMPARISON:  07/28/2020 FINDINGS: Brain: No acute infarct, mass effect or extra-axial collection. No acute or chronic hemorrhage. There is multifocal hyperintense T2-weighted signal within the white matter. Generalized volume loss. The midline structures are normal. Vascular: Major flow voids are preserved. Skull and upper cervical spine: Normal calvarium and skull base. Visualized upper cervical spine and soft tissues are normal. Sinuses/Orbits:No paranasal sinus fluid levels or advanced mucosal thickening. No mastoid or middle ear effusion. 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 No acute intracranial abnormality. 2. Findings of chronic small vessel ischemia and volume loss. Electronically Signed   By: Ulyses Jarred M.D.   On: 09/01/2022 18:54   CT Renal Stone Study  Result Date: 09/01/2022 CLINICAL DATA:  Abdominal and flank pain EXAM: CT ABDOMEN AND PELVIS WITHOUT CONTRAST TECHNIQUE: Multidetector CT imaging of the abdomen and pelvis was  performed following the standard protocol without IV contrast. RADIATION DOSE REDUCTION: This exam was performed according to the departmental dose-optimization program which includes automated exposure control, adjustment of the mA and/or kV according to patient size and/or use of  iterative reconstruction technique. COMPARISON:  Abdominal ultrasound 12/04/2021 and CT abdomen 07/27/2020 FINDINGS: Lower chest: Paraseptal emphysema. Left anterior descending and right coronary artery atherosclerosis. Bilateral gynecomastia. Right paraesophageal node 2.1 cm in short axis on image 13 series 3, new compared to the prior exam. Hepatobiliary: Indistinct mass density within and along the medial margin the right hepatic lobe substantially abuts the right kidney upper pole. I suspect that this is probably arising from the liver given the degree of hepatic heterogeneity which may represent other lesions in the right hepatic lobe and segment 4. Nodular liver contour possibly from cirrhosis or pseudo cirrhosis. Underlying mass difficult to measure but potentially around 10 cm in diameter. Right adrenal gland indistinguishable from the mass. The dominant mass likewise substantially abuts the IVC and may extend in the right porta hepatis region adjacent to the pancreatic head. 1.9 cm gallstone in the gallbladder. Pancreas: As noted above there is indistinctness of fat planes around the pancreatic head possibly due to extension of the hepatic mass in this vicinity, or local adenopathy. A pancreatic primary malignancy is not excluded. Spleen: Unremarkable Adrenals/Urinary Tract: The right adrenal gland is surrounded by indistinct density favoring tumor. Left adrenal gland unremarkable. Right kidney upper pole abutted by the right mass extending along the hepatic renal space. Nonobstructive 0.5 cm right kidney lower pole calculus. Urinary bladder unremarkable. Stomach/Bowel: Direct right inguinal hernia containing a loop of bowel with  an air-level and possibly a small amount of ascites. No dilated bowel extending into this hernia to suggest obstruction. No pneumatosis. Vascular/Lymphatic: The mass along the right hepatic lobe is substantially in the vicinity of the IVC and may be exerting mass effect on the IVC. There appears to be porta hepatis and peripancreatic adenopathy, including a 2.8 cm portacaval lymph node and a 2.2 cm peripancreatic node on images 37 and 24 of series 3, respectively. Right retrocrural node 1.1 cm in short axis, image 19 series 3. Aortocaval node 1.6 cm in short axis, image 42 series 3. Separate aortocaval node 2.3 cm in short axis, image 50 series 3. Atherosclerosis is present, including aortoiliac atherosclerotic disease. Reproductive: Unremarkable Other: Trace edema along the right paracolic gutter. Musculoskeletal: Left total hip prosthesis introducing streak artifact. 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 In conjunction with lumbar spondylosis and degenerative disc disease there is substantial bilateral foraminal impingement at L3-4, L4-5, and L5-S1. Old posterior fractures the right eleventh and twelfth ribs. IMPRESSION: 1. Large mass along the medial margin of the right hepatic lobe substantially in the vicinity of the IVC and right adrenal gland, probably arising from the liver given the degree of heterogeneity of the liver parenchyma. There is pathologic porta hepatis, peripancreatic, retrocrural, lower thoracic paraesophageal, and retroperitoneal adenopathy. The appearance is highly suspicious for malignancy, probably hepatocellular carcinoma with extrahepatic extension given the underlying cirrhosis, although other malignancies are not excluded. The dominant mass angles the left adrenal gland and substantially abuts the IVC and right kidney upper pole. Oncology referral and tissue diagnosis is recommended. Comprehensive  staging imaging such as nuclear medicine PET-CT recommended. Patency of the IVC and portal vein could be affected by this mass, but not assessed on noncontrast CT. 2. Direct right inguinal hernia containing a loop of bowel with an air-level and possibly a small amount of ascites. No dilated bowel extending into this hernia to suggest obstruction. 3. Nodular liver contour possibly from cirrhosis or  less likely, pseudo-cirrhosis. 4. Other imaging findings of potential clinical significance: Coronary atherosclerosis. Paraseptal emphysema. Cholelithiasis. Nonobstructive right kidney lower pole calculus. 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 Lumbar spondylosis and degenerative disc disease causing substantial bilateral foraminal impingement at L3-4, L4-5, and L5-S1. Old posterior fractures the right eleventh and twelfth ribs. Aortic and systemic atherosclerosis. Aortic Atherosclerosis (ICD10-I70.0) and Emphysema (ICD10-J43.9). Electronically Signed   By: Van Clines M.D.   On: 09/01/2022 15:36   CT Head Wo Contrast  Result Date: 09/01/2022 CLINICAL DATA:  Mental status changes, weakness EXAM: CT HEAD WITHOUT CONTRAST TECHNIQUE: Contiguous axial images were obtained from the base of the skull through the vertex without intravenous contrast. RADIATION DOSE REDUCTION: This exam was performed according to the departmental dose-optimization program which includes automated exposure control, adjustment of the mA and/or kV according to patient size and/or use of iterative reconstruction technique. COMPARISON:  07/27/2020 FINDINGS: Brain: Stable mild atrophy pattern without acute intracranial hemorrhage, mass lesion, new infarction, midline shift, herniation, hydrocephalus, or extra-axial fluid collection. No focal mass effect or edema. Cisterns are patent. No cerebellar abnormality. Vascular: No hyperdense vessel or unexpected calcification. Skull: Normal. Negative for fracture or  focal lesion. Sinuses/Orbits: Clear sinuses. No acute orbital abnormality. Chronic calcifications of the right globe. Remote right orbital fracture medially. Other: None. IMPRESSION: Stable mild atrophy pattern. No acute intracranial abnormality by noncontrast CT. No interval change. Electronically Signed   By: Jerilynn Mages.  Shick M.D.   On: 09/01/2022 11:00   DG Chest 2 View  Result Date: 09/01/2022 CLINICAL DATA:  Weakness, shortness of breath. EXAM: CHEST - 2 VIEW COMPARISON:  Chest radiographs dated August 05, 2020 FINDINGS: The heart size and mediastinal contours are within normal limits. Both lungs are clear. Moderate bilateral acromioclavicular osteoarthritis. Mild thoracic spondylosis. IMPRESSION: 1. No active cardiopulmonary disease. 2. Moderate bilateral acromioclavicular osteoarthritis. Electronically Signed   By: Keane Police D.O.   On: 09/01/2022 10:24    Impression/Plan: -Abnormal CT concerning for large hepatic mass with lymphadenopathy.  Unable to get contrasted imaging because of elevated kidney functions.  Concerning for hepatocellular carcinoma given underlying cirrhosis.  Had elevated AFP in October 2023 and outpatient CT with contrast was ordered but it was not done.  Patient was also no-show for repeat labs in the office.  -Cirrhosis from history of hepatitis C and alcohol use.  Complicated by encephalopathy and portal hypertension with esophageal varices.  -Acute kidney injury.  Recommendations ------------------------- -Unfortunately, looks like patient may have large liver mass which may be abutting the IVC.  Not able to get contrasted imaging because of elevated creatinine. -We will get IR consult for ultrasound or CT-guided biopsy. -Meanwhile continue supportive care  -Start albumin 100 g daily -Continue lactulose.  Add Xifaxan. -Prognosis guarded.  Discussed with brother at bedside.  GI will follow.    LOS: 0 days   Otis Brace  MD, FACP 09/02/2022, 9:09  AM  Contact #  [phone]

## 2022-09-03 ENCOUNTER — Inpatient Hospital Stay (HOSPITAL_COMMUNITY): Payer: Medicare Other

## 2022-09-03 DIAGNOSIS — N179 Acute kidney failure, unspecified: Secondary | ICD-10-CM | POA: Diagnosis not present

## 2022-09-03 DIAGNOSIS — B182 Chronic viral hepatitis C: Secondary | ICD-10-CM | POA: Diagnosis not present

## 2022-09-03 DIAGNOSIS — D72829 Elevated white blood cell count, unspecified: Secondary | ICD-10-CM

## 2022-09-03 DIAGNOSIS — E8729 Other acidosis: Secondary | ICD-10-CM | POA: Diagnosis not present

## 2022-09-03 DIAGNOSIS — I10 Essential (primary) hypertension: Secondary | ICD-10-CM | POA: Diagnosis not present

## 2022-09-03 LAB — COMPREHENSIVE METABOLIC PANEL
ALT: 32 U/L (ref 0–44)
AST: 42 U/L — ABNORMAL HIGH (ref 15–41)
Albumin: 2.9 g/dL — ABNORMAL LOW (ref 3.5–5.0)
Alkaline Phosphatase: 167 U/L — ABNORMAL HIGH (ref 38–126)
Anion gap: 10 (ref 5–15)
BUN: 49 mg/dL — ABNORMAL HIGH (ref 8–23)
CO2: 19 mmol/L — ABNORMAL LOW (ref 22–32)
Calcium: 9.5 mg/dL (ref 8.9–10.3)
Chloride: 109 mmol/L (ref 98–111)
Creatinine, Ser: 1.42 mg/dL — ABNORMAL HIGH (ref 0.61–1.24)
GFR, Estimated: 56 mL/min — ABNORMAL LOW (ref >60)
Glucose, Bld: 98 mg/dL (ref 70–99)
Potassium: 3.7 mmol/L (ref 3.5–5.1)
Sodium: 138 mmol/L (ref 135–145)
Total Bilirubin: 2.2 mg/dL — ABNORMAL HIGH (ref 0.3–1.2)
Total Protein: 6.8 g/dL (ref 6.5–8.1)

## 2022-09-03 LAB — CANCER ANTIGEN 19-9: CA 19-9: <2 U/mL (ref 0–35)

## 2022-09-03 LAB — CBC WITH DIFFERENTIAL/PLATELET
Abs Immature Granulocytes: 0.28 10*3/uL — ABNORMAL HIGH (ref 0.00–0.07)
Basophils Absolute: 0.1 10*3/uL (ref 0.0–0.1)
Basophils Relative: 0 %
Eosinophils Absolute: 0.1 10*3/uL (ref 0.0–0.5)
Eosinophils Relative: 0 %
HCT: 23.6 % — ABNORMAL LOW (ref 39.0–52.0)
Hemoglobin: 8.8 g/dL — ABNORMAL LOW (ref 13.0–17.0)
Immature Granulocytes: 2 %
Lymphocytes Relative: 13 %
Lymphs Abs: 1.8 10*3/uL (ref 0.7–4.0)
MCH: 32.5 pg (ref 26.0–34.0)
MCHC: 37.3 g/dL — ABNORMAL HIGH (ref 30.0–36.0)
MCV: 87.1 fL (ref 80.0–100.0)
Monocytes Absolute: 1.6 10*3/uL — ABNORMAL HIGH (ref 0.1–1.0)
Monocytes Relative: 11 %
Neutro Abs: 10.3 10*3/uL — ABNORMAL HIGH (ref 1.7–7.7)
Neutrophils Relative %: 74 %
Platelets: 237 10*3/uL (ref 150–400)
RBC: 2.71 MIL/uL — ABNORMAL LOW (ref 4.22–5.81)
RDW: 15.1 % (ref 11.5–15.5)
WBC: 14 10*3/uL — ABNORMAL HIGH (ref 4.0–10.5)
nRBC: 3.6 % — ABNORMAL HIGH (ref 0.0–0.2)

## 2022-09-03 LAB — CEA: CEA: 3.3 ng/mL (ref 0.0–4.7)

## 2022-09-03 LAB — PATHOLOGIST SMEAR REVIEW

## 2022-09-03 LAB — AFP TUMOR MARKER: AFP, Serum, Tumor Marker: 218 ng/mL — ABNORMAL HIGH (ref 0.0–8.4)

## 2022-09-03 MED ORDER — OXYCODONE HCL 5 MG PO TABS
10.0000 mg | ORAL_TABLET | Freq: Four times a day (QID) | ORAL | Status: DC | PRN
  Administered 2022-09-03 – 2022-09-05 (×7): 10 mg via ORAL
  Filled 2022-09-03 (×7): qty 2

## 2022-09-03 NOTE — Progress Notes (Signed)
PROGRESS NOTE    Calvin [name]  NFA:[id number] DOB: [birth year] DOA: 09/01/2022 PCP: Jenny Reichmann, PA-C   Brief Narrative:  61 y.o. male with medical history significant of hyperlipidemia, hypertension, anemia, alcohol use, TIA/stroke, cirrhosis, hepatitis C, right eye blindness presented with weakness and fall.  On presentation, creatinine was 5.4 from baseline of 1.4 with AST of 59, ALT 55, ALP 231, T. bili of 2 and WBC of 17.6.  Respiratory panel for flu and COVID-19 were negative.  Chest x-ray showed no acute abnormality.  CT head without contrast showed no acute abnormality.  CT renal stone study showed large mass at the right hepatic lobe near the IVC with reactive adenopathy suspicious for malignancy along with inguinal hernia and cirrhosis.  He was started on IV fluids.  GI was consulted.  Assessment & Plan:   AKI Uremia Acute metabolic acidosis -Creatinine 5.4 on presentation with baseline of 1.4.  Treated with IV fluids.  Creatinine improving to 1.42 today.  Decrease IV fluids to 75 cc an hour.  Repeat a.m. labs.  Liver mass Elevated LFTs Possible cirrhosis of liver from history of chronic hepatitis C and alcohol abuse Possible hepatoencephalopathy -GI following.  Might need liver biopsy versus MRI of abdomen -AFP significantly elevated -CT/MRI of brain without contrast was negative for acute intracranial abnormality -Continue propranolol, rifaximin and lactulose.  Lasix on hold  Leukocytosis -Slightly improving.  Repeat a.m. labs  Anemia of chronic disease -Possibly from chronic illnesses.  Hemoglobin stable.  Monitor intermittently  Essential hypertension -Blood pressure still on the lower side.  Amlodipine and Lasix on hold.  Physical deconditioning -PT eval   DVT prophylaxis: Heparin subcutaneous Code Status: DNR Family Communication: None at bedside Disposition Plan: Status is: Inpatient Remains inpatient appropriate because: Of severity of  illness  Consultants: GI  Procedures: None  Antimicrobials: None   Subjective: Patient seen and examined at bedside.  Awake, slow to respond, poor historian.  Abdominal pain is improving.  No fever, seizures, vomiting reported.  Objective: Vitals:   09/02/22 1622 09/03/22 0048 09/03/22 0338 09/03/22 1058  BP: 123/83 97/69 104/73 111/73  Pulse: 92 82 77 82  Resp: '18 18 17 17  '$ Temp: 97.9 F (36.6 C) 97.7 F (36.5 C) 97.9 F (36.6 C) 97.8 F (36.6 C)  TempSrc: Oral Oral Oral Oral  SpO2: 100% 100% 100% 100%    Intake/Output Summary (Last 24 hours) at 09/03/2022 1130 Last data filed at 09/03/2022 0900 Gross per 24 hour  Intake 700 ml  Output 2150 ml  Net -1450 ml   There were no vitals filed for this visit.  Examination:  General exam: Appears calm and comfortable.  Chronically ill and deconditioned.  Currently on room air. Respiratory system: Bilateral decreased breath sounds at bases with scattered crackles Cardiovascular system: S1 & S2 heard, Rate controlled Gastrointestinal system: Abdomen is distended, soft and mildly tender diffusely. Normal bowel sounds heard. Extremities: No cyanosis, clubbing; trace lower extremity edema  Central nervous system: Awake, slow to respond, poor historian.  No focal neurological deficits. Moving extremities Skin: No rashes, lesions or ulcers Psychiatry: Flat affect.  No signs of agitation.     Data Reviewed: I have personally reviewed following labs and imaging studies  CBC: Recent Labs  Lab 09/01/22 0945 09/02/22 0210 09/03/22 0308  WBC 17.6* 15.9* 14.0*  NEUTROABS 13.2*  --  10.3*  HGB 11.0* 10.1* 8.8*  HCT 31.5* 28.0* 23.6*  MCV 92.1 87.8 87.1  PLT 473* 320 237  Basic Metabolic Panel: Recent Labs  Lab 09/01/22 0945 09/02/22 0210 09/03/22 0308  NA 133* 135 138  K 4.2 3.4* 3.7  CL 99 102 109  CO2 14* 18* 19*  GLUCOSE 144* 118* 98  BUN 90* 76* 49*  CREATININE 5.40* 3.25* 1.42*  CALCIUM 9.4 9.3 9.5  MG  2.7*  --   --    GFR: CrCl cannot be calculated (Unknown ideal weight.). Liver Function Tests: Recent Labs  Lab 09/01/22 0945 09/02/22 0210 09/03/22 0308  AST 59* 47* 42*  ALT 55* 48* 32  ALKPHOS 231* 208* 167*  BILITOT 2.0* 2.0* 2.2*  PROT 8.8* 7.7 6.8  ALBUMIN 2.5* 2.6* 2.9*   Recent Labs  Lab 09/01/22 0945  LIPASE 42   Recent Labs  Lab 09/01/22 2121  AMMONIA 71*   Coagulation Profile: Recent Labs  Lab 09/01/22 2121 09/02/22 0210  INR 1.5* 1.6*   Cardiac Enzymes: No results for input(s): "CKTOTAL", "CKMB", "CKMBINDEX", "TROPONINI" in the last 168 hours. BNP (last 3 results) No results for input(s): "PROBNP" in the last 8760 hours. HbA1C: No results for input(s): "HGBA1C" in the last 72 hours. CBG: No results for input(s): "GLUCAP" in the last 168 hours. Lipid Profile: No results for input(s): "CHOL", "HDL", "LDLCALC", "TRIG", "CHOLHDL", "LDLDIRECT" in the last 72 hours. Thyroid Function Tests: No results for input(s): "TSH", "T4TOTAL", "FREET4", "T3FREE", "THYROIDAB" in the last 72 hours. Anemia Panel: No results for input(s): "VITAMINB12", "FOLATE", "FERRITIN", "TIBC", "IRON", "RETICCTPCT" in the last 72 hours. Sepsis Labs: No results for input(s): "PROCALCITON", "LATICACIDVEN" in the last 168 hours.  Recent Results (from the past 240 hour(s))  Resp panel by RT-PCR (RSV, Flu A&B, Covid) Anterior Nasal Swab     Status: None   Collection Time: 09/01/22  1:49 PM   Specimen: Anterior Nasal Swab  Result Value Ref Range Status   SARS Coronavirus 2 by RT PCR NEGATIVE NEGATIVE Final    Comment: (NOTE) SARS-CoV-2 target nucleic acids are NOT DETECTED.  The SARS-CoV-2 RNA is generally detectable in upper respiratory specimens during the acute phase of infection. The lowest concentration of SARS-CoV-2 viral copies this assay can detect is 138 copies/mL. A negative result does not preclude SARS-Cov-2 infection and should not be used as the sole basis for  treatment or other patient management decisions. A negative result may occur with  improper specimen collection/handling, submission of specimen other than nasopharyngeal swab, presence of viral mutation(s) within the areas targeted by this assay, and inadequate number of viral copies(<138 copies/mL). A negative result must be combined with clinical observations, patient history, and epidemiological information. The expected result is Negative.  Fact Sheet for Patients:  EntrepreneurPulse.com.au  Fact Sheet for Healthcare Providers:  IncredibleEmployment.be  This test is no t yet approved or cleared by the [place] FDA and  has been authorized for detection and/or diagnosis of SARS-CoV-2 by FDA under an Emergency Use Authorization (EUA). This EUA will remain  in effect (meaning this test can be used) for the duration of the COVID-19 declaration under Section 564(b)(1) of the Act, 21 U.S.C.section 360bbb-3(b)(1), unless the authorization is terminated  or revoked sooner.       Influenza A by PCR NEGATIVE NEGATIVE Final   Influenza B by PCR NEGATIVE NEGATIVE Final    Comment: (NOTE) The Xpert Xpress SARS-CoV-2/FLU/RSV plus assay is intended as an aid in the diagnosis of influenza from Nasopharyngeal swab specimens and should not be used as a sole basis for treatment. Nasal washings and aspirates are  unacceptable for Xpert Xpress SARS-CoV-2/FLU/RSV testing.  Fact Sheet for Patients: EntrepreneurPulse.com.au  Fact Sheet for Healthcare Providers: IncredibleEmployment.be  This test is not yet approved or cleared by the [place] FDA and has been authorized for detection and/or diagnosis of SARS-CoV-2 by FDA under an Emergency Use Authorization (EUA). This EUA will remain in effect (meaning this test can be used) for the duration of the COVID-19 declaration under Section 564(b)(1) of the Act, 21  U.S.C. section 360bbb-3(b)(1), unless the authorization is terminated or revoked.     Resp Syncytial Virus by PCR NEGATIVE NEGATIVE Final    Comment: (NOTE) Fact Sheet for Patients: EntrepreneurPulse.com.au  Fact Sheet for Healthcare Providers: IncredibleEmployment.be  This test is not yet approved or cleared by the [place] FDA and has been authorized for detection and/or diagnosis of SARS-CoV-2 by FDA under an Emergency Use Authorization (EUA). This EUA will remain in effect (meaning this test can be used) for the duration of the COVID-19 declaration under Section 564(b)(1) of the Act, 21 U.S.C. section 360bbb-3(b)(1), unless the authorization is terminated or revoked.  Performed at [facility], [place] [address] [place], [place] [address]          Radiology Studies: MR BRAIN WO CONTRAST  Result Date: 09/01/2022 CLINICAL DATA:  Acute neurologic deficit EXAM: MRI HEAD WITHOUT CONTRAST TECHNIQUE: Multiplanar, multiecho pulse sequences of the brain and surrounding structures were obtained without intravenous contrast. COMPARISON:  07/28/2020 FINDINGS: Brain: No acute infarct, mass effect or extra-axial collection. No acute or chronic hemorrhage. There is multifocal hyperintense T2-weighted signal within the white matter. Generalized volume loss. The midline structures are normal. Vascular: Major flow voids are preserved. Skull and upper cervical spine: Normal calvarium and skull base. Visualized upper cervical spine and soft tissues are normal. Sinuses/Orbits:No paranasal sinus fluid levels or advanced mucosal thickening. No mastoid or middle ear effusion. 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 No acute intracranial abnormality. 2. Findings of chronic small vessel ischemia and volume loss. Electronically Signed   By: Ulyses Jarred M.D.   On: 09/01/2022 18:54   CT Renal Stone Study  Result Date:  09/01/2022 CLINICAL DATA:  Abdominal and flank pain EXAM: CT ABDOMEN AND PELVIS WITHOUT CONTRAST TECHNIQUE: Multidetector CT imaging of the abdomen and pelvis was performed following the standard protocol without IV contrast. RADIATION DOSE REDUCTION: This exam was performed according to the departmental dose-optimization program which includes automated exposure control, adjustment of the mA and/or kV according to patient size and/or use of iterative reconstruction technique. COMPARISON:  Abdominal ultrasound 12/04/2021 and CT abdomen 07/27/2020 FINDINGS: Lower chest: Paraseptal emphysema. Left anterior descending and right coronary artery atherosclerosis. Bilateral gynecomastia. Right paraesophageal node 2.1 cm in short axis on image 13 series 3, new compared to the prior exam. Hepatobiliary: Indistinct mass density within and along the medial margin the right hepatic lobe substantially abuts the right kidney upper pole. I suspect that this is probably arising from the liver given the degree of hepatic heterogeneity which may represent other lesions in the right hepatic lobe and segment 4. Nodular liver contour possibly from cirrhosis or pseudo cirrhosis. Underlying mass difficult to measure but potentially around 10 cm in diameter. Right adrenal gland indistinguishable from the mass. The dominant mass likewise substantially abuts the IVC and may extend in the right porta hepatis region adjacent to the pancreatic head. 1.9 cm gallstone in the gallbladder. Pancreas: As noted above there is indistinctness of fat planes around the pancreatic head possibly  due to extension of the hepatic mass in this vicinity, or local adenopathy. A pancreatic primary malignancy is not excluded. Spleen: Unremarkable Adrenals/Urinary Tract: The right adrenal gland is surrounded by indistinct density favoring tumor. Left adrenal gland unremarkable. Right kidney upper pole abutted by the right mass extending along the hepatic renal  space. Nonobstructive 0.5 cm right kidney lower pole calculus. Urinary bladder unremarkable. Stomach/Bowel: Direct right inguinal hernia containing a loop of bowel with an air-level and possibly a small amount of ascites. No dilated bowel extending into this hernia to suggest obstruction. No pneumatosis. Vascular/Lymphatic: The mass along the right hepatic lobe is substantially in the vicinity of the IVC and may be exerting mass effect on the IVC. There appears to be porta hepatis and peripancreatic adenopathy, including a 2.8 cm portacaval lymph node and a 2.2 cm peripancreatic node on images 37 and 24 of series 3, respectively. Right retrocrural node 1.1 cm in short axis, image 19 series 3. Aortocaval node 1.6 cm in short axis, image 42 series 3. Separate aortocaval node 2.3 cm in short axis, image 50 series 3. Atherosclerosis is present, including aortoiliac atherosclerotic disease. Reproductive: Unremarkable Other: Trace edema along the right paracolic gutter. Musculoskeletal: Left total hip prosthesis introducing streak artifact. 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 In conjunction with lumbar spondylosis and degenerative disc disease there is substantial bilateral foraminal impingement at L3-4, L4-5, and L5-S1. Old posterior fractures the right eleventh and twelfth ribs. IMPRESSION: 1. Large mass along the medial margin of the right hepatic lobe substantially in the vicinity of the IVC and right adrenal gland, probably arising from the liver given the degree of heterogeneity of the liver parenchyma. There is pathologic porta hepatis, peripancreatic, retrocrural, lower thoracic paraesophageal, and retroperitoneal adenopathy. The appearance is highly suspicious for malignancy, probably hepatocellular carcinoma with extrahepatic extension given the underlying cirrhosis, although other malignancies are not excluded. The  dominant mass angles the left adrenal gland and substantially abuts the IVC and right kidney upper pole. Oncology referral and tissue diagnosis is recommended. Comprehensive staging imaging such as nuclear medicine PET-CT recommended. Patency of the IVC and portal vein could be affected by this mass, but not assessed on noncontrast CT. 2. Direct right inguinal hernia containing a loop of bowel with an air-level and possibly a small amount of ascites. No dilated bowel extending into this hernia to suggest obstruction. 3. Nodular liver contour possibly from cirrhosis or less likely, pseudo-cirrhosis. 4. Other imaging findings of potential clinical significance: Coronary atherosclerosis. Paraseptal emphysema. Cholelithiasis. Nonobstructive right kidney lower pole calculus. 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 Lumbar spondylosis and degenerative disc disease causing substantial bilateral foraminal impingement at L3-4, L4-5, and L5-S1. Old posterior fractures the right eleventh and twelfth ribs. Aortic and systemic atherosclerosis. Aortic Atherosclerosis (ICD10-I70.0) and Emphysema (ICD10-J43.9). Electronically Signed   By: Van Clines M.D.   On: 09/01/2022 15:36        Scheduled Meds:  folic acid  1 mg Oral Daily   heparin  5,000 Units Subcutaneous Q8H   lactulose  20 g Oral BID   pantoprazole  40 mg Oral BID   propranolol  10 mg Oral BID   rifaximin  550 mg Oral BID   sodium chloride flush  3 mL Intravenous Q12H   thiamine  100 mg Oral Daily   Continuous Infusions:  lactated ringers  75 mL/hr at 09/03/22 0804          Aline August, MD [facility] 09/03/2022, 11:30 AM

## 2022-09-03 NOTE — Progress Notes (Signed)
[facility] Progress Note  Calvin [name] 61 y.o. [birth year]  CC: Liver mass, hepatic encephalopathy, cirrhosis   Subjective: Patient seen and examined at bedside.  His brother remains at bedside.  He is more alert today compared to yesterday.  Denies any abdominal pain.  Denies nausea or vomiting.  Wants to go home.  ROS : Afebrile, negative for chest pain   Objective: Vital signs in last 24 hours: Vitals:   09/03/22 0048 09/03/22 0338  BP: 97/69 104/73  Pulse: 82 77  Resp: 18 17  Temp: 97.7 F (36.5 C) 97.9 F (36.6 C)  SpO2: 100% 100%    Physical Exam: He is more alert today compared to yesterday.  His abdomen is moderately distended with some fullness but otherwise nontender, bowel sounds present.  Mood and affect normal.  Lab Results: Recent Labs    09/01/22 0945 09/02/22 0210 09/03/22 0308  NA 133* 135 138  K 4.2 3.4* 3.7  CL 99 102 109  CO2 14* 18* 19*  GLUCOSE 144* 118* 98  BUN 90* 76* 49*  CREATININE 5.40* 3.25* 1.42*  CALCIUM 9.4 9.3 9.5  MG 2.7*  --   --    Recent Labs    09/02/22 0210 09/03/22 0308  AST 47* 42*  ALT 48* 32  ALKPHOS 208* 167*  BILITOT 2.0* 2.2*  PROT 7.7 6.8  ALBUMIN 2.6* 2.9*   Recent Labs    09/01/22 0945 09/02/22 0210 09/03/22 0308  WBC 17.6* 15.9* 14.0*  NEUTROABS 13.2*  --  10.3*  HGB 11.0* 10.1* 8.8*  HCT 31.5* 28.0* 23.6*  MCV 92.1 87.8 87.1  PLT 473* 320 237   Recent Labs    09/01/22 2121 09/02/22 0210  LABPROT 18.1* 18.7*  INR 1.5* 1.6*      Assessment/Plan: -Abnormal CT concerning for large hepatic mass with lymphadenopathy.  Unable to get contrasted imaging because of elevated kidney functions.  Concerning for hepatocellular carcinoma given underlying cirrhosis.  Had elevated AFP in October 2023 and outpatient CT with contrast was ordered but it was not done.  Patient was also no-show for repeat labs in the office.   -Cirrhosis from history of hepatitis C and alcohol use.  Complicated by  encephalopathy and portal hypertension with esophageal varices.   -Acute kidney injury.  Kidney functions improved significantly   Recommendations ------------------------- -Significantly elevated AFP at 218 concerning for underlying hepatocellular carcinoma.  His AFP was 34 in the end of October 2023 and significant rising AFP within few weeks is concerning for aggressive cancer.  -Despite of repeated attempts, I was not able to get hold of interventional radiology support staff yesterday to discuss timing for liver biopsy.  -Patient's kidney function has improved significantly and I think we will be able to get MRI with IV contrast done today.  This was discussed with patient brother at bedside.  -Discussed with hospitalist.  Continue other supportive care.   Otis Brace MD, [place] 09/03/2022, 10:16 AM  Contact #  [phone]

## 2022-09-04 ENCOUNTER — Inpatient Hospital Stay (HOSPITAL_COMMUNITY): Payer: Medicare Other

## 2022-09-04 DIAGNOSIS — Z7189 Other specified counseling: Secondary | ICD-10-CM

## 2022-09-04 DIAGNOSIS — B182 Chronic viral hepatitis C: Secondary | ICD-10-CM | POA: Diagnosis not present

## 2022-09-04 DIAGNOSIS — N179 Acute kidney failure, unspecified: Secondary | ICD-10-CM | POA: Diagnosis not present

## 2022-09-04 DIAGNOSIS — D638 Anemia in other chronic diseases classified elsewhere: Secondary | ICD-10-CM | POA: Diagnosis not present

## 2022-09-04 DIAGNOSIS — R160 Hepatomegaly, not elsewhere classified: Secondary | ICD-10-CM | POA: Diagnosis not present

## 2022-09-04 LAB — PROTIME-INR
INR: 1.7 — ABNORMAL HIGH (ref 0.8–1.2)
Prothrombin Time: 20 seconds — ABNORMAL HIGH (ref 11.4–15.2)

## 2022-09-04 LAB — COMPREHENSIVE METABOLIC PANEL
ALT: 31 U/L (ref 0–44)
AST: 36 U/L (ref 15–41)
Albumin: 3.4 g/dL — ABNORMAL LOW (ref 3.5–5.0)
Alkaline Phosphatase: 139 U/L — ABNORMAL HIGH (ref 38–126)
Anion gap: 11 (ref 5–15)
BUN: 30 mg/dL — ABNORMAL HIGH (ref 8–23)
CO2: 20 mmol/L — ABNORMAL LOW (ref 22–32)
Calcium: 10 mg/dL (ref 8.9–10.3)
Chloride: 111 mmol/L (ref 98–111)
Creatinine, Ser: 1.01 mg/dL (ref 0.61–1.24)
GFR, Estimated: >60 mL/min (ref >60)
Glucose, Bld: 101 mg/dL — ABNORMAL HIGH (ref 70–99)
Potassium: 3.7 mmol/L (ref 3.5–5.1)
Sodium: 142 mmol/L (ref 135–145)
Total Bilirubin: 2.6 mg/dL — ABNORMAL HIGH (ref 0.3–1.2)
Total Protein: 7.1 g/dL (ref 6.5–8.1)

## 2022-09-04 LAB — CBC WITH DIFFERENTIAL/PLATELET
Abs Immature Granulocytes: 0.23 10*3/uL — ABNORMAL HIGH (ref 0.00–0.07)
Basophils Absolute: 0.1 10*3/uL (ref 0.0–0.1)
Basophils Relative: 0 %
Eosinophils Absolute: 0 10*3/uL (ref 0.0–0.5)
Eosinophils Relative: 0 %
HCT: 23.8 % — ABNORMAL LOW (ref 39.0–52.0)
Hemoglobin: 8.7 g/dL — ABNORMAL LOW (ref 13.0–17.0)
Immature Granulocytes: 1 %
Lymphocytes Relative: 9 %
Lymphs Abs: 1.6 10*3/uL (ref 0.7–4.0)
MCH: 32.5 pg (ref 26.0–34.0)
MCHC: 36.6 g/dL — ABNORMAL HIGH (ref 30.0–36.0)
MCV: 88.8 fL (ref 80.0–100.0)
Monocytes Absolute: 1.7 10*3/uL — ABNORMAL HIGH (ref 0.1–1.0)
Monocytes Relative: 10 %
Neutro Abs: 13.5 10*3/uL — ABNORMAL HIGH (ref 1.7–7.7)
Neutrophils Relative %: 80 %
Platelets: 219 10*3/uL (ref 150–400)
RBC: 2.68 MIL/uL — ABNORMAL LOW (ref 4.22–5.81)
RDW: 16.1 % — ABNORMAL HIGH (ref 11.5–15.5)
WBC: 17.1 10*3/uL — ABNORMAL HIGH (ref 4.0–10.5)
nRBC: 1.5 % — ABNORMAL HIGH (ref 0.0–0.2)

## 2022-09-04 LAB — AMMONIA: Ammonia: 46 umol/L — ABNORMAL HIGH (ref 9–35)

## 2022-09-04 LAB — MAGNESIUM: Magnesium: 1.3 mg/dL — ABNORMAL LOW (ref 1.7–2.4)

## 2022-09-04 MED ORDER — HEPARIN SODIUM (PORCINE) 5000 UNIT/ML IJ SOLN
5000.0000 [IU] | Freq: Three times a day (TID) | INTRAMUSCULAR | Status: DC
  Administered 2022-09-05 – 2022-09-08 (×9): 5000 [IU] via SUBCUTANEOUS
  Filled 2022-09-04 (×10): qty 1

## 2022-09-04 MED ORDER — GELATIN ABSORBABLE 12-7 MM EX MISC
1.0000 | Freq: Once | CUTANEOUS | Status: DC
  Filled 2022-09-04 (×2): qty 1

## 2022-09-04 MED ORDER — MIDAZOLAM HCL 2 MG/2ML IJ SOLN
INTRAMUSCULAR | Status: AC
  Filled 2022-09-04: qty 2

## 2022-09-04 MED ORDER — LIDOCAINE HCL (PF) 1 % IJ SOLN
INTRAMUSCULAR | Status: AC
  Filled 2022-09-04: qty 30

## 2022-09-04 MED ORDER — MAGNESIUM SULFATE 2 GM/50ML IV SOLN
2.0000 g | Freq: Once | INTRAVENOUS | Status: AC
  Administered 2022-09-04: 2 g via INTRAVENOUS
  Filled 2022-09-04: qty 50

## 2022-09-04 MED ORDER — FENTANYL CITRATE (PF) 100 MCG/2ML IJ SOLN
INTRAMUSCULAR | Status: AC
  Filled 2022-09-04: qty 2

## 2022-09-04 MED ORDER — MIDAZOLAM HCL 2 MG/2ML IJ SOLN
INTRAMUSCULAR | Status: AC | PRN
  Administered 2022-09-04: .5 mg via INTRAVENOUS

## 2022-09-04 MED ORDER — FENTANYL CITRATE (PF) 100 MCG/2ML IJ SOLN
INTRAMUSCULAR | Status: AC | PRN
  Administered 2022-09-04: 50 ug via INTRAVENOUS

## 2022-09-04 MED ORDER — GELATIN ABSORBABLE 12-7 MM EX MISC
CUTANEOUS | Status: AC
  Filled 2022-09-04: qty 1

## 2022-09-04 MED ORDER — LIDOCAINE HCL (PF) 1 % IJ SOLN: 5.0000 mL | Freq: Once | INTRAMUSCULAR | Status: DC

## 2022-09-04 NOTE — Consult Note (Signed)
Chief Complaint: Patient was seen in consultation today for liver lesion - for biopsy Chief Complaint  Patient presents with   Fall   Weakness   at the request of Dr Alessandra Bevels  Supervising Physician: Mir, Sharen Heck  Patient Status: [facility] - In-pt  History of Present Illness: Calvin [name] is a 61 y.o. male   HTN; HLD; ETOH CVA; Cirrhosis; Hep C;admitted with weakness and falls CT renal stone study showed large mass at the right hepatic lobe near the IVC with reactive adenopathy suspicious for malignancy along with inguinal hernia and cirrhosis.   Dr Alessandra Bevels note yesterday:  -Significantly elevated AFP at 218 concerning for underlying hepatocellular carcinoma.  His AFP was 34 in the end of October 2023 and significant rising AFP within few weeks is concerning for aggressive cancer.  Request for liver lesion biopsy  Past Medical History:  Diagnosis Date   Ascites    Chronic left hip pain    Cirrhosis ([place])    Hypertension    Stroke [facility])    TIA (transient ischemic attack) 06/23/2016    Past Surgical History:  Procedure Laterality Date   BACK SURGERY     CERVICAL SPINE SURGERY     ESOPHAGEAL BANDING N/A 01/25/2021   Procedure: ESOPHAGEAL BANDING;  Surgeon: Otis Brace, MD;  Location: [facility];  Service: Gastroenterology;  Laterality: N/A;   ESOPHAGOGASTRODUODENOSCOPY (EGD) WITH PROPOFOL N/A 01/25/2021   Procedure: ESOPHAGOGASTRODUODENOSCOPY (EGD) WITH PROPOFOL;  Surgeon: Otis Brace, MD;  Location: [facility];  Service: Gastroenterology;  Laterality: N/A;   ESOPHAGOGASTRODUODENOSCOPY (EGD) WITH PROPOFOL N/A 05/21/2021   Procedure: ESOPHAGOGASTRODUODENOSCOPY (EGD) WITH PROPOFOL;  Surgeon: Otis Brace, MD;  Location: [facility];  Service: Gastroenterology;  Laterality: N/A;   JOINT REPLACEMENT      Allergies: Patient has no known allergies.  Medications: Prior to Admission medications   Medication Sig Start Date End Date Taking? Authorizing  Provider  allopurinol (ZYLOPRIM) 300 MG tablet Take 300 mg by mouth daily. 10/22/20  Yes [provider]  amLODipine (NORVASC) 5 MG tablet Take 5 mg by mouth daily. 10/10/20  Yes [provider]  buprenorphine (BUTRANS) 7.5 MCG/HR 1 patch once a week. 06/21/22  Yes [provider]  diphenhydrAMINE (BENADRYL) 25 MG tablet Take 25 mg by mouth at bedtime as needed for itching or sleep.   Yes [provider]  folic acid (FOLVITE) 1 MG tablet Take 1 mg by mouth daily. 02/14/20  Yes [provider]  furosemide (LASIX) 20 MG tablet Take 20 mg by mouth daily. 02/14/20  Yes [provider]  gabapentin (NEURONTIN) 300 MG capsule Take 300 mg by mouth daily.   Yes [provider]  hydrocortisone cream 1 % Apply 1 application topically 3 (three) times daily as needed for itching.   Yes [provider]  lactulose (CHRONULAC) 10 GM/15ML solution Take 30 mLs (20 g total) by mouth 2 (two) times daily. 04/07/20  Yes Elgergawy, Silver Huguenin, MD  omeprazole (PRILOSEC OTC) 20 MG tablet Take 2 tablets (40 mg total) by mouth daily. 05/21/21 09/03/22 Yes Brahmbhatt, Parag, MD  Oxycodone HCl 10 MG TABS Take 10 mg by mouth in the morning, at noon, and at bedtime.   Yes [provider]  propranolol (INDERAL) 10 MG tablet Take 10 mg by mouth 2 (two) times daily. 07/02/22  Yes [provider]  rifaximin (XIFAXAN) 550 MG TABS tablet Take 550 mg by mouth 2 (two) times daily.   Yes [provider]  senna (SENOKOT) 8.6  MG TABS tablet Take 2 tablets by mouth daily as needed for mild constipation.   Yes [provider]  tiZANidine (ZANAFLEX) 4 MG tablet Take 4 mg by mouth 3 (three) times daily as needed for muscle spasms. 11/17/20  Yes [provider]  nicotine (NICODERM CQ - DOSED IN MG/24 HOURS) 21 mg/24hr patch Place 1 patch (21 mg total) onto the skin daily. Patient not taking: Reported on 09/03/2022 04/08/20   Elgergawy,  Silver Huguenin, MD  thiamine 100 MG tablet Take 1 tablet (100 mg total) by mouth daily. Patient not taking: Reported on 09/03/2022 04/08/20   Elgergawy, Silver Huguenin, MD     Family History  Family history unknown: Yes    Social History   Socioeconomic History   Marital status: Married    Spouse name: Not on file   Number of children: Not on file   Years of education: Not on file   Highest education level: Not on file  Occupational History   Not on file  Tobacco Use   Smoking status: Every Day    Types: Cigarettes   Smokeless tobacco: Never   Tobacco comments:    1/2 pack per day  Substance and Sexual Activity   Alcohol use: Yes    Comment: 2 beers/day   Drug use: No   Sexual activity: Not on file  Other Topics Concern   Not on file  Social History Narrative   Not on file   Social Determinants of Health   Financial Resource Strain: Not on file  Food Insecurity: Not on file  Transportation Needs: Not on file  Physical Activity: Not on file  Stress: Not on file  Social Connections: Not on file     Review of Systems: A 12 point ROS discussed and pertinent positives are indicated in the HPI above.  All other systems are negative.  Review of Systems  Constitutional:  Positive for activity change. Negative for fatigue and fever.  Respiratory:  Negative for cough.   Cardiovascular:  Negative for chest pain.  Gastrointestinal:  Positive for abdominal pain and nausea.  Neurological:  Positive for weakness.  Psychiatric/Behavioral:  Positive for confusion. Negative for behavioral problems.     Vital Signs: BP 118/75 (BP Location: Right Arm)   Pulse 79   Temp 97.6 F (36.4 C) (Oral)   Resp 17   SpO2 100%     Physical Exam Vitals reviewed.  Cardiovascular:     Rate and Rhythm: Normal rate and regular rhythm.     Heart sounds: Normal heart sounds.  Pulmonary:     Effort: Pulmonary effort is normal.  Abdominal:     Palpations: Abdomen is soft.  Musculoskeletal:         General: Normal range of motion.     Comments: Slow to respond   Skin:    General: Skin is warm.  Neurological:     Mental Status: He is alert.     Comments: Is able to state name and dob with propmting  Psychiatric:     Comments: Brother at bedside He consented to procedure     Imaging: MR LIVER WO CONRTAST  Result Date: 09/03/2022 CLINICAL DATA:  Suspected liver lesions in a 61 year old male. No contrast was administered for this exam. Only limited imaging was acquired at patient request. Patient asked to discontinue the exam. EXAM: MRI ABDOMEN WITHOUT CONTRAST TECHNIQUE: Multiplanar multisequence MR imaging was performed without the administration of intravenous contrast. COMPARISON:  CT of the abdomen and  pelvis performed on September 01, 2022. FINDINGS: Lower chest: Incidental imaging of the lower chest is unremarkable to the extent evaluated aside from small bilateral pleural effusions, very limited assessment on this abdominal MRI. Hepatobiliary: Nodular surface contour the liver compatible with cirrhotic morphology though assessment limited due to the markedly abnormal appearance of the liver which is filled with tumor. Dominant lesion in the RIGHT hepatic lobe 8.6 x 6.7 cm (image 17/4) tumor displays bilobar involvement and is greatest on the RIGHT. Next largest hepatic tumor as a discrete lesion that can be visualized in the RIGHT hepatic lobe is in the range of 6.3 cm. Tumors ranging between 2.5 and 2.8 cm in the lateral segment of the LEFT hepatic lobe (image 21/4. Portal vein cannot be assessed but may be involved with tumor and or displaced. Area most suspicious for portal vein involvement seen on image 20/4 with some expansion and intermediate signal that is contiguous with celiac adenopathy. No gross biliary duct dilation. Edematous appearing gallbladder with cholelithiasis remains nonspecific in the setting of generalized edema throughout the abdomen. Pancreas: Not well assessed.  Generalized edema throughout the abdomen. Masses which are contiguous with the pancreas but potentially represent lymph nodes, for instance on image 30/4 an area measuring 3.3 cm is more likely a necrotic lymph node but cannot be well evaluated. No ductal dilation of the pancreas. Spleen:  Spleen is top-normal size. Adrenals/Urinary Tract: No hydronephrosis. Large RIGHT adrenal mass is suspected along with tumor implant adjacent to the RIGHT adrenal and RIGHT kidney. Tumor implant on image 19/42.6 cm. RIGHT adrenal inseparable from medial RIGHT hepatic lobe mass. LEFT adrenal gland is normal. Stomach/Bowel: Extensive bowel edema of uncertain significance in this patient with suspected liver disease. Bowel not well assessed. Vascular/Lymphatic: Normal caliber of the abdominal aorta. Potential portal vein invasion from tumor. Adenopathy in the retroperitoneum and upper abdomen. Other: Small volume ascites and diffuse mesenteric and body wall edema, suspect this is slightly increased compared to previous imaging. Musculoskeletal: No gross abnormalities, limited assessment. IMPRESSION: 1. Findings of diffuse hepatic tumor worse in the RIGHT hepatic lobe, bilobar involvement with extensive adenopathy in the upper abdomen. 2. Question of portal vein involvement though this areas not well assessed. Study is highly limited due to motion and incomplete acquisition of even sequences that would constitute a noncontrast abdominal MRI. No contrast was given in the study was terminated early at patient request. 3. Findings raise the question of primary hepatic neoplasm with metastatic disease in the abdomen. Tumor marker correlation may be helpful. In the event the tumor markers are equivocal tissue sampling may be helpful. 4. Developing anasarca. Electronically Signed   By: Zetta Bills M.D.   On: 09/03/2022 17:49   MR BRAIN WO CONTRAST  Result Date: 09/01/2022 CLINICAL DATA:  Acute neurologic deficit EXAM: MRI HEAD  WITHOUT CONTRAST TECHNIQUE: Multiplanar, multiecho pulse sequences of the brain and surrounding structures were obtained without intravenous contrast. COMPARISON:  07/28/2020 FINDINGS: Brain: No acute infarct, mass effect or extra-axial collection. No acute or chronic hemorrhage. There is multifocal hyperintense T2-weighted signal within the white matter. Generalized volume loss. The midline structures are normal. Vascular: Major flow voids are preserved. Skull and upper cervical spine: Normal calvarium and skull base. Visualized upper cervical spine and soft tissues are normal. Sinuses/Orbits:No paranasal sinus fluid levels or advanced mucosal thickening. No mastoid or middle ear effusion. 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 No acute intracranial abnormality. 2. Findings of chronic small vessel ischemia and  volume loss. Electronically Signed   By: Ulyses Jarred M.D.   On: 09/01/2022 18:54   CT Renal Stone Study  Result Date: 09/01/2022 CLINICAL DATA:  Abdominal and flank pain EXAM: CT ABDOMEN AND PELVIS WITHOUT CONTRAST TECHNIQUE: Multidetector CT imaging of the abdomen and pelvis was performed following the standard protocol without IV contrast. RADIATION DOSE REDUCTION: This exam was performed according to the departmental dose-optimization program which includes automated exposure control, adjustment of the mA and/or kV according to patient size and/or use of iterative reconstruction technique. COMPARISON:  Abdominal ultrasound 12/04/2021 and CT abdomen 07/27/2020 FINDINGS: Lower chest: Paraseptal emphysema. Left anterior descending and right coronary artery atherosclerosis. Bilateral gynecomastia. Right paraesophageal node 2.1 cm in short axis on image 13 series 3, new compared to the prior exam. Hepatobiliary: Indistinct mass density within and along the medial margin the right hepatic lobe substantially abuts the right kidney upper pole. I suspect that this is  probably arising from the liver given the degree of hepatic heterogeneity which may represent other lesions in the right hepatic lobe and segment 4. Nodular liver contour possibly from cirrhosis or pseudo cirrhosis. Underlying mass difficult to measure but potentially around 10 cm in diameter. Right adrenal gland indistinguishable from the mass. The dominant mass likewise substantially abuts the IVC and may extend in the right porta hepatis region adjacent to the pancreatic head. 1.9 cm gallstone in the gallbladder. Pancreas: As noted above there is indistinctness of fat planes around the pancreatic head possibly due to extension of the hepatic mass in this vicinity, or local adenopathy. A pancreatic primary malignancy is not excluded. Spleen: Unremarkable Adrenals/Urinary Tract: The right adrenal gland is surrounded by indistinct density favoring tumor. Left adrenal gland unremarkable. Right kidney upper pole abutted by the right mass extending along the hepatic renal space. Nonobstructive 0.5 cm right kidney lower pole calculus. Urinary bladder unremarkable. Stomach/Bowel: Direct right inguinal hernia containing a loop of bowel with an air-level and possibly a small amount of ascites. No dilated bowel extending into this hernia to suggest obstruction. No pneumatosis. Vascular/Lymphatic: The mass along the right hepatic lobe is substantially in the vicinity of the IVC and may be exerting mass effect on the IVC. There appears to be porta hepatis and peripancreatic adenopathy, including a 2.8 cm portacaval lymph node and a 2.2 cm peripancreatic node on images 37 and 24 of series 3, respectively. Right retrocrural node 1.1 cm in short axis, image 19 series 3. Aortocaval node 1.6 cm in short axis, image 42 series 3. Separate aortocaval node 2.3 cm in short axis, image 50 series 3. Atherosclerosis is present, including aortoiliac atherosclerotic disease. Reproductive: Unremarkable Other: Trace edema along the right  paracolic gutter. Musculoskeletal: Left total hip prosthesis introducing streak artifact. 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 In conjunction with lumbar spondylosis and degenerative disc disease there is substantial bilateral foraminal impingement at L3-4, L4-5, and L5-S1. Old posterior fractures the right eleventh and twelfth ribs. IMPRESSION: 1. Large mass along the medial margin of the right hepatic lobe substantially in the vicinity of the IVC and right adrenal gland, probably arising from the liver given the degree of heterogeneity of the liver parenchyma. There is pathologic porta hepatis, peripancreatic, retrocrural, lower thoracic paraesophageal, and retroperitoneal adenopathy. The appearance is highly suspicious for malignancy, probably hepatocellular carcinoma with extrahepatic extension given the underlying cirrhosis, although other malignancies are not excluded. The dominant  mass angles the left adrenal gland and substantially abuts the IVC and right kidney upper pole. Oncology referral and tissue diagnosis is recommended. Comprehensive staging imaging such as nuclear medicine PET-CT recommended. Patency of the IVC and portal vein could be affected by this mass, but not assessed on noncontrast CT. 2. Direct right inguinal hernia containing a loop of bowel with an air-level and possibly a small amount of ascites. No dilated bowel extending into this hernia to suggest obstruction. 3. Nodular liver contour possibly from cirrhosis or less likely, pseudo-cirrhosis. 4. Other imaging findings of potential clinical significance: Coronary atherosclerosis. Paraseptal emphysema. Cholelithiasis. Nonobstructive right kidney lower pole calculus. 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 Lumbar spondylosis and degenerative disc disease causing substantial bilateral foraminal impingement at L3-4,  L4-5, and L5-S1. Old posterior fractures the right eleventh and twelfth ribs. Aortic and systemic atherosclerosis. Aortic Atherosclerosis (ICD10-I70.0) and Emphysema (ICD10-J43.9). Electronically Signed   By: Van Clines M.D.   On: 09/01/2022 15:36   CT Head Wo Contrast  Result Date: 09/01/2022 CLINICAL DATA:  Mental status changes, weakness EXAM: CT HEAD WITHOUT CONTRAST TECHNIQUE: Contiguous axial images were obtained from the base of the skull through the vertex without intravenous contrast. RADIATION DOSE REDUCTION: This exam was performed according to the departmental dose-optimization program which includes automated exposure control, adjustment of the mA and/or kV according to patient size and/or use of iterative reconstruction technique. COMPARISON:  07/27/2020 FINDINGS: Brain: Stable mild atrophy pattern without acute intracranial hemorrhage, mass lesion, new infarction, midline shift, herniation, hydrocephalus, or extra-axial fluid collection. No focal mass effect or edema. Cisterns are patent. No cerebellar abnormality. Vascular: No hyperdense vessel or unexpected calcification. Skull: Normal. Negative for fracture or focal lesion. Sinuses/Orbits: Clear sinuses. No acute orbital abnormality. Chronic calcifications of the right globe. Remote right orbital fracture medially. Other: None. IMPRESSION: Stable mild atrophy pattern. No acute intracranial abnormality by noncontrast CT. No interval change. Electronically Signed   By: Jerilynn Mages.  Shick M.D.   On: 09/01/2022 11:00   DG Chest 2 View  Result Date: 09/01/2022 CLINICAL DATA:  Weakness, shortness of breath. EXAM: CHEST - 2 VIEW COMPARISON:  Chest radiographs dated August 05, 2020 FINDINGS: The heart size and mediastinal contours are within normal limits. Both lungs are clear. Moderate bilateral acromioclavicular osteoarthritis. Mild thoracic spondylosis. IMPRESSION: 1. No active cardiopulmonary disease. 2. Moderate bilateral acromioclavicular  osteoarthritis. Electronically Signed   By: Keane Police D.O.   On: 09/01/2022 10:24    Labs:  CBC: Recent Labs    09/01/22 0945 09/02/22 0210 09/03/22 0308 09/04/22 0252  WBC 17.6* 15.9* 14.0* 17.1*  HGB 11.0* 10.1* 8.8* 8.7*  HCT 31.5* 28.0* 23.6* 23.8*  PLT 473* 320 237 219    COAGS: Recent Labs    09/01/22 2121 09/02/22 0210 09/04/22 0648  INR 1.5* 1.6* 1.7*  APTT 38*  --   --     BMP: Recent Labs    09/01/22 0945 09/02/22 0210 09/03/22 0308 09/04/22 0252  NA 133* 135 138 142  K 4.2 3.4* 3.7 3.7  CL 99 102 109 111  CO2 14* 18* 19* 20*  GLUCOSE 144* 118* 98 101*  BUN 90* 76* 49* 30*  CALCIUM 9.4 9.3 9.5 10.0  CREATININE 5.40* 3.25* 1.42* 1.01  GFRNONAA 11* 21* 56* >60    LIVER FUNCTION TESTS: Recent Labs    09/01/22 0945 09/02/22 0210 09/03/22 0308 09/04/22 0252  BILITOT 2.0* 2.0* 2.2* 2.6*  AST 59* 47*  42* 36  ALT 55* 48* 32 31  ALKPHOS 231* 208* 167* 139*  PROT 8.8* 7.7 6.8 7.1  ALBUMIN 2.5* 2.6* 2.9* 3.4*    TUMOR MARKERS: No results for input(s): "AFPTM", "CEA", "CA199", "CHROMGRNA" in the last 8760 hours.  Assessment and Plan:  Hep C; Alcohol cirrhosis Liver masses INR elevated 1.7 (1.6/ 1.5 in hospital) Scheduled for liver lesion biopsy in IR--- will discuss INR with Rad first (Dr Mir approves moving ahead with Bx today) Risks and benefits of liver lesion was discussed with the patient and/or patient's family including, but not limited to bleeding, infection, damage to adjacent structures or low yield requiring additional tests.  All of the questions were answered and there is agreement to proceed.  Consent signed and in chart.  Thank you for this interesting consult.  I greatly enjoyed meeting Calvin [name] and look forward to participating in their care.  A copy of this report was sent to the requesting provider on this date.  Electronically Signed: Lavonia Drafts, PA-C 09/04/2022, 9:28 AM   I spent a total of 20  Minutes    in face to face in clinical consultation, greater than 50% of which was counseling/coordinating care for liver lesion biopsy

## 2022-09-04 NOTE — Plan of Care (Signed)

## 2022-09-04 NOTE — Progress Notes (Signed)
PROGRESS NOTE    Calvin [name]  AST:[id number] DOB: [birth year] DOA: 09/01/2022 PCP: Jenny Reichmann, PA-C   Brief Narrative:  61 y.o. male with medical history significant of hyperlipidemia, hypertension, anemia, alcohol use, TIA/stroke, cirrhosis, hepatitis C, right eye blindness presented with weakness and fall.  On presentation, creatinine was 5.4 from baseline of 1.4 with AST of 59, ALT 55, ALP 231, T. bili of 2 and WBC of 17.6.  Respiratory panel for flu and COVID-19 were negative.  Chest x-ray showed no acute abnormality.  CT head without contrast showed no acute abnormality.  CT renal stone study showed large mass at the right hepatic lobe near the IVC with reactive adenopathy suspicious for malignancy along with inguinal hernia and cirrhosis.  He was started on IV fluids.  GI was consulted.  Assessment & Plan:   AKI Uremia Acute metabolic acidosis -Creatinine 5.4 on presentation with baseline of 1.4.  Treated with IV fluids.  Creatinine improving to 1.01 today.  DC IV fluids.  Repeat a.m. labs.  Liver mass Elevated LFTs Possible cirrhosis of liver from history of chronic hepatitis C and alcohol abuse Possible hepatoencephalopathy -GI following.  MRI of abdomen shows findings of diffuse hepatic tumor: Findings raise the question of primary hepatic neoplasm with metastatic disease in the abdomen with developing anasarca.  For possible liver biopsy by IR today. -AFP significantly elevated -CT/MRI of brain without contrast was negative for acute intracranial abnormality -Continue propranolol, rifaximin and lactulose.  Lasix on hold -Consult palliative care for goals of care discussion  Leukocytosis -Worsening.  Repeat a.m. labs  Anemia of chronic disease -Possibly from chronic illnesses.  Hemoglobin stable.  Monitor intermittently  Essential hypertension -Blood pressure still on the lower side.  Amlodipine and Lasix on hold.  Hypomagnesemia -Replace.  Repeat a.m.  labs.  Physical deconditioning -PT eval   DVT prophylaxis: Heparin subcutaneous Code Status: DNR Family Communication: None at bedside Disposition Plan: Status is: Inpatient Remains inpatient appropriate because: Of severity of illness  Consultants: GI  Procedures: None  Antimicrobials: None   Subjective: Patient seen and examined at bedside.  Poor historian.  No overnight fever, seizures, vomiting reported.  Objective: Vitals:   09/03/22 0338 09/03/22 1058 09/03/22 2012 09/04/22 0638  BP: 104/73 111/73 (!) 153/86 118/75  Pulse: 77 82 (!) 102 79  Resp: 17 17    Temp: 97.9 F (36.6 C) 97.8 F (36.6 C) 97.6 F (36.4 C)   TempSrc: Oral Oral Oral   SpO2: 100% 100% 100% 100%    Intake/Output Summary (Last 24 hours) at 09/04/2022 0802 Last data filed at 09/04/2022 0535 Gross per 24 hour  Intake 220 ml  Output 1150 ml  Net -930 ml    There were no vitals filed for this visit.  Examination:  General: On room air.  No distress.  Looks chronically ill and deconditioned. ENT/neck: No thyromegaly.  JVD is not elevated  respiratory: Decreased breath sounds at bases bilaterally with some crackles; no wheezing  CVS: S1-S2 heard, mild intermittent tachycardia present  abdominal: Soft, mildly tender diffusely, slightly distended; no organomegaly, normal bowel sounds are heard Extremities: Trace lower extremity edema; no cyanosis  CNS: Sleepy, wakes up very slightly.  Slow to respond.  Poor historian.  No focal neurologic deficit.  Moves extremities Lymph: No obvious lymphadenopathy Skin: No obvious ecchymosis/lesions  psych: Not agitated currently.  Affect is mostly flat. musculoskeletal: No obvious joint swelling/deformity     Data Reviewed: I have personally reviewed following labs and  imaging studies  CBC: Recent Labs  Lab 09/01/22 0945 09/02/22 0210 09/03/22 0308 09/04/22 0252  WBC 17.6* 15.9* 14.0* 17.1*  NEUTROABS 13.2*  --  10.3* 13.5*  HGB 11.0* 10.1*  8.8* 8.7*  HCT 31.5* 28.0* 23.6* 23.8*  MCV 92.1 87.8 87.1 88.8  PLT 473* 320 237 433    Basic Metabolic Panel: Recent Labs  Lab 09/01/22 0945 09/02/22 0210 09/03/22 0308 09/04/22 0252  NA 133* 135 138 142  K 4.2 3.4* 3.7 3.7  CL 99 102 109 111  CO2 14* 18* 19* 20*  GLUCOSE 144* 118* 98 101*  BUN 90* 76* 49* 30*  CREATININE 5.40* 3.25* 1.42* 1.01  CALCIUM 9.4 9.3 9.5 10.0  MG 2.7*  --   --  1.3*    GFR: CrCl cannot be calculated (Unknown ideal weight.). Liver Function Tests: Recent Labs  Lab 09/01/22 0945 09/02/22 0210 09/03/22 0308 09/04/22 0252  AST 59* 47* 42* 36  ALT 55* 48* 32 31  ALKPHOS 231* 208* 167* 139*  BILITOT 2.0* 2.0* 2.2* 2.6*  PROT 8.8* 7.7 6.8 7.1  ALBUMIN 2.5* 2.6* 2.9* 3.4*    Recent Labs  Lab 09/01/22 0945  LIPASE 42    Recent Labs  Lab 09/01/22 2121 09/04/22 0252  AMMONIA 71* 46*    Coagulation Profile: Recent Labs  Lab 09/01/22 2121 09/02/22 0210 09/04/22 0648  INR 1.5* 1.6* 1.7*    Cardiac Enzymes: No results for input(s): "CKTOTAL", "CKMB", "CKMBINDEX", "TROPONINI" in the last 168 hours. BNP (last 3 results) No results for input(s): "PROBNP" in the last 8760 hours. HbA1C: No results for input(s): "HGBA1C" in the last 72 hours. CBG: No results for input(s): "GLUCAP" in the last 168 hours. Lipid Profile: No results for input(s): "CHOL", "HDL", "LDLCALC", "TRIG", "CHOLHDL", "LDLDIRECT" in the last 72 hours. Thyroid Function Tests: No results for input(s): "TSH", "T4TOTAL", "FREET4", "T3FREE", "THYROIDAB" in the last 72 hours. Anemia Panel: No results for input(s): "VITAMINB12", "FOLATE", "FERRITIN", "TIBC", "IRON", "RETICCTPCT" in the last 72 hours. Sepsis Labs: No results for input(s): "PROCALCITON", "LATICACIDVEN" in the last 168 hours.  Recent Results (from the past 240 hour(s))  Resp panel by RT-PCR (RSV, Flu A&B, Covid) Anterior Nasal Swab     Status: None   Collection Time: 09/01/22  1:49 PM   Specimen:  Anterior Nasal Swab  Result Value Ref Range Status   SARS Coronavirus 2 by RT PCR NEGATIVE NEGATIVE Final    Comment: (NOTE) SARS-CoV-2 target nucleic acids are NOT DETECTED.  The SARS-CoV-2 RNA is generally detectable in upper respiratory specimens during the acute phase of infection. The lowest concentration of SARS-CoV-2 viral copies this assay can detect is 138 copies/mL. A negative result does not preclude SARS-Cov-2 infection and should not be used as the sole basis for treatment or other patient management decisions. A negative result may occur with  improper specimen collection/handling, submission of specimen other than nasopharyngeal swab, presence of viral mutation(s) within the areas targeted by this assay, and inadequate number of viral copies(<138 copies/mL). A negative result must be combined with clinical observations, patient history, and epidemiological information. The expected result is Negative.  Fact Sheet for Patients:  EntrepreneurPulse.com.au  Fact Sheet for Healthcare Providers:  IncredibleEmployment.be  This test is no t yet approved or cleared by the [place] FDA and  has been authorized for detection and/or diagnosis of SARS-CoV-2 by FDA under an Emergency Use Authorization (EUA). This EUA will remain  in effect (meaning this test can be used) for  the duration of the COVID-19 declaration under Section 564(b)(1) of the Act, 21 U.S.C.section 360bbb-3(b)(1), unless the authorization is terminated  or revoked sooner.       Influenza A by PCR NEGATIVE NEGATIVE Final   Influenza B by PCR NEGATIVE NEGATIVE Final    Comment: (NOTE) The Xpert Xpress SARS-CoV-2/FLU/RSV plus assay is intended as an aid in the diagnosis of influenza from Nasopharyngeal swab specimens and should not be used as a sole basis for treatment. Nasal washings and aspirates are unacceptable for Xpert Xpress SARS-CoV-2/FLU/RSV testing.  Fact  Sheet for Patients: EntrepreneurPulse.com.au  Fact Sheet for Healthcare Providers: IncredibleEmployment.be  This test is not yet approved or cleared by the [place] FDA and has been authorized for detection and/or diagnosis of SARS-CoV-2 by FDA under an Emergency Use Authorization (EUA). This EUA will remain in effect (meaning this test can be used) for the duration of the COVID-19 declaration under Section 564(b)(1) of the Act, 21 U.S.C. section 360bbb-3(b)(1), unless the authorization is terminated or revoked.     Resp Syncytial Virus by PCR NEGATIVE NEGATIVE Final    Comment: (NOTE) Fact Sheet for Patients: EntrepreneurPulse.com.au  Fact Sheet for Healthcare Providers: IncredibleEmployment.be  This test is not yet approved or cleared by the [place] FDA and has been authorized for detection and/or diagnosis of SARS-CoV-2 by FDA under an Emergency Use Authorization (EUA). This EUA will remain in effect (meaning this test can be used) for the duration of the COVID-19 declaration under Section 564(b)(1) of the Act, 21 U.S.C. section 360bbb-3(b)(1), unless the authorization is terminated or revoked.  Performed at [facility], [place] [address], [place], [place] [address]          Radiology Studies: MR LIVER WO CONRTAST  Result Date: 09/03/2022 CLINICAL DATA:  Suspected liver lesions in a 61 year old male. No contrast was administered for this exam. Only limited imaging was acquired at patient request. Patient asked to discontinue the exam. EXAM: MRI ABDOMEN WITHOUT CONTRAST TECHNIQUE: Multiplanar multisequence MR imaging was performed without the administration of intravenous contrast. COMPARISON:  CT of the abdomen and pelvis performed on September 01, 2022. FINDINGS: Lower chest: Incidental imaging of the lower chest is unremarkable to the extent evaluated aside from small bilateral pleural  effusions, very limited assessment on this abdominal MRI. Hepatobiliary: Nodular surface contour the liver compatible with cirrhotic morphology though assessment limited due to the markedly abnormal appearance of the liver which is filled with tumor. Dominant lesion in the RIGHT hepatic lobe 8.6 x 6.7 cm (image 17/4) tumor displays bilobar involvement and is greatest on the RIGHT. Next largest hepatic tumor as a discrete lesion that can be visualized in the RIGHT hepatic lobe is in the range of 6.3 cm. Tumors ranging between 2.5 and 2.8 cm in the lateral segment of the LEFT hepatic lobe (image 21/4. Portal vein cannot be assessed but may be involved with tumor and or displaced. Area most suspicious for portal vein involvement seen on image 20/4 with some expansion and intermediate signal that is contiguous with celiac adenopathy. No gross biliary duct dilation. Edematous appearing gallbladder with cholelithiasis remains nonspecific in the setting of generalized edema throughout the abdomen. Pancreas: Not well assessed. Generalized edema throughout the abdomen. Masses which are contiguous with the pancreas but potentially represent lymph nodes, for instance on image 30/4 an area measuring 3.3 cm is more likely a necrotic lymph node but cannot be well evaluated. No ductal dilation of the pancreas. Spleen:  Spleen is top-normal  size. Adrenals/Urinary Tract: No hydronephrosis. Large RIGHT adrenal mass is suspected along with tumor implant adjacent to the RIGHT adrenal and RIGHT kidney. Tumor implant on image 19/42.6 cm. RIGHT adrenal inseparable from medial RIGHT hepatic lobe mass. LEFT adrenal gland is normal. Stomach/Bowel: Extensive bowel edema of uncertain significance in this patient with suspected liver disease. Bowel not well assessed. Vascular/Lymphatic: Normal caliber of the abdominal aorta. Potential portal vein invasion from tumor. Adenopathy in the retroperitoneum and upper abdomen. Other: Small volume  ascites and diffuse mesenteric and body wall edema, suspect this is slightly increased compared to previous imaging. Musculoskeletal: No gross abnormalities, limited assessment. IMPRESSION: 1. Findings of diffuse hepatic tumor worse in the RIGHT hepatic lobe, bilobar involvement with extensive adenopathy in the upper abdomen. 2. Question of portal vein involvement though this areas not well assessed. Study is highly limited due to motion and incomplete acquisition of even sequences that would constitute a noncontrast abdominal MRI. No contrast was given in the study was terminated early at patient request. 3. Findings raise the question of primary hepatic neoplasm with metastatic disease in the abdomen. Tumor marker correlation may be helpful. In the event the tumor markers are equivocal tissue sampling may be helpful. 4. Developing anasarca. Electronically Signed   By: Zetta Bills M.D.   On: 09/03/2022 17:49        Scheduled Meds:  folic acid  1 mg Oral Daily   [START ON 09/05/2022] heparin  5,000 Units Subcutaneous Q8H   lactulose  20 g Oral BID   pantoprazole  40 mg Oral BID   propranolol  10 mg Oral BID   rifaximin  550 mg Oral BID   sodium chloride flush  3 mL Intravenous Q12H   thiamine  100 mg Oral Daily   Continuous Infusions:  lactated ringers 75 mL/hr (09/03/22 2144)          Aline August, MD [facility] 09/04/2022, 8:02 AM

## 2022-09-04 NOTE — Progress Notes (Signed)
-   Patient not in the room.  Likely getting IR guided liver biopsy.  We will follow-up tomorrow.  Otis Brace MD, [place] 09/04/2022, 11:35 AM  Contact #  [phone]

## 2022-09-04 NOTE — Consult Note (Signed)
Consultation Note Date: 09/04/2022   Patient Name: Calvin [name]  DOB: 1961-05-01  MRN: [id number]  Age / Sex: 61 y.o., male  PCP: Jenny Reichmann, PA-C Referring Physician: Aline August, MD  Reason for Consultation:  New diagnosis of possible liver cancer with mets and cirrhosis  HPI/Patient Profile: 61 y.o. male  with past medical history of ETOH cirrhosis, chronic hep C, CVA, TIAs, right eye blindness admitted on 09/01/2022 with complaints of weakness and falls.  Workup revealed acute kidney injury with creatinine elevated to 5.4 as well as BUN up at 90 CT of the abdomen showed large liver mass CT of the head and MRI were negative MRI of the abdomen showed diffuse hepatic tumor.  He has undergone ultrasound-guided biopsy of the tumor.  His AFP was also elevated.  Palliative medicine consulted for above.  Primary Decision Maker PATIENT- surrogate decision maker being determined- per patient's brother there is a dedicated healthcare power of attorney named [name] however he does not have contact information for her.  Patient is divorced and has a living daughter however per patient he is estranged from his daughter.  He has 2 brothers who have been involved in his care Joneen Boers and Kasandra Knudsen they are going to contact me with the HCPOA's contact information.  Discussion: Chart reviewed including labs, progress notes, imaging from this and previous encounters.  I met at the bedside with [facility].  He was oriented x 3 and able to tell me that he was in the hospital and had just had a biopsy.   Zayin has recently moved into [facility] and this has made it much easier for him to get around he had become unable to climb the stairs in his older apartment.  He is otherwise independent for all ADLs and nutrition. He understands that it is possible he has cancer.  For now his primary goal is to go home.  He does  not want to remain in the hospital.  He believes he was told by his doctor that he could be discharged and wait for the biopsy results at home. If it does look to be cancer he would be accepting of any treatments. He has DNR in place but wants all other medical interventions offered to him.     SUMMARY OF RECOMMENDATIONS -DNR with full scope -GOC for now are to return home and await biopsy results    Code Status/Advance Care Planning: DNR   Prognosis:   Unable to determine  Discharge Planning: To Be Determined  Primary Diagnoses: Present on Admission:  Hyperlipidemia  Essential hypertension  Chronic hepatitis C without hepatic coma (HCC)  Anemia of chronic disease  AKI (acute kidney injury) ([place])   Review of Systems  Constitutional:  Positive for appetite change and fatigue.  Gastrointestinal:  Positive for abdominal pain.    Physical Exam Vitals and nursing note reviewed.  Cardiovascular:     Rate and Rhythm: Normal rate.  Pulmonary:     Effort: Pulmonary effort is normal.  Neurological:  Mental Status: He is alert and oriented to person, place, and time.     Comments: Dysarthric speech     Vital Signs: BP 107/72   Pulse 75   Temp (!) 97.5 F (36.4 C) (Oral)   Resp 16   SpO2 99%  Pain Scale: 0-10   Pain Score: 0-No pain   SpO2: SpO2: 99 % O2 Device:SpO2: 99 % O2 Flow Rate: .O2 Flow Rate (L/min): 2 L/min  IO: Intake/output summary:  Intake/Output Summary (Last 24 hours) at 09/04/2022 1556 Last data filed at 09/04/2022 1441 Gross per 24 hour  Intake 100 ml  Output 1150 ml  Net -1050 ml    LBM: Last BM Date : 09/03/22 Baseline Weight:   Most recent weight:         Thank you for this consult. Palliative medicine will continue to follow and assist as needed.  Time Total: 95 minutes Greater than 50%  of this time was spent counseling and coordinating care related to the above assessment and plan.  Signed by: [name], AGNP-C Palliative  Medicine    Please contact Palliative Medicine Team phone at [phone] for questions and concerns.  For individual provider: See Shea Evans

## 2022-09-04 NOTE — Procedures (Signed)
Interventional Radiology Procedure Note  Procedure: US guided liver mass biopsy  Indication: Multiple liver lesions  Findings: Please refer to procedural dictation for full description.  Complications: None  EBL: < 10 mL  Miachel Roux, MD [phone]

## 2022-09-04 NOTE — Evaluation (Signed)
Physical Therapy Evaluation Patient Details Name: Calvin [name] MRN: [id number] DOB: October 26, 1960 Today's Date: 09/04/2022  History of Present Illness  Calvin [name] is a 61 y.o. male with medical history significant of hyperlipidemia, hypertension, anemia, alcohol use, TIA/stroke, cirrhosis, hepatitis C, right eye blindness presenting with weakness and fall. Presented to ED for multiple falls. He was diagnosed with AKI  Clinical Impression  Pt tolerated treatment session well. He was living alone in one level senior apartment prior to hospitalization. Pt has some help intermittently at home but is by himself most of the time. Pt uses a SPC for ambulation and denies falls prior to recently that led up to coming to ER. Pt demonstrates unsteadiness on his feet with swaying in all directions with both RW and SPC due to pt is unskilled at using RW and had difficulty adapting; pt continued with sway and instability especially with turns with use of SPC. Pt will benefit from skilled physical therapy service on discharge from acute care hospital setting with HHPT services in order to decrease risk for falls, injury and rehospitalization. Pt would benefit from intermittent assistance and supervision from family. Pt demonstrates no signs/symptoms of cardiac/respiratory distress throughout session.        Recommendations for follow up therapy are one component of a multi-disciplinary discharge planning process, led by the attending physician.  Recommendations may be updated based on patient status, additional functional criteria and insurance authorization.  Follow Up Recommendations Home health PT      Assistance Recommended at Discharge Intermittent Supervision/Assistance  Patient can return home with the following  A little help with walking and/or transfers;Assistance with cooking/housework;Assist for transportation    Equipment Recommendations Rolling walker (2 wheels)  Recommendations for Other  Services       Functional Status Assessment Patient has had a recent decline in their functional status and demonstrates the ability to make significant improvements in function in a reasonable and predictable amount of time.     Precautions / Restrictions Precautions Precautions: Fall Restrictions Weight Bearing Restrictions: No      Mobility  Bed Mobility Overal bed mobility: Modified Independent             General bed mobility comments: extra time wtih use of bil UE Patient Response: Cooperative  Transfers Overall transfer level: Needs assistance Equipment used: Rolling walker (2 wheels) Transfers: Sit to/from Stand, Bed to chair/wheelchair/BSC Sit to Stand: Min guard   Step pivot transfers: Min guard       General transfer comment: pt requires guarding due to multi directional sway and poor balance as well as difficutly managing RW. Pt did well with SPC but has wide BOS.    Ambulation/Gait Ambulation/Gait assistance: Min guard Gait Distance (Feet): 50 Feet Assistive device: Rolling walker (2 wheels), Straight cane Gait Pattern/deviations: Step-through pattern, Decreased step length - left, Decreased step length - right, Wide base of support, Staggering right, Staggering left   Gait velocity interpretation: <1.8 ft/sec, indicate of risk for recurrent falls   General Gait Details: Pt has unsteady gait especially with turns with wide BOS. pt is unstable with both RW and SPC. Pt requires more training to utilize RW more appropriately  Stairs Stairs:  (not applicable pt does not have stairs.)               Balance Overall balance assessment: Needs assistance Sitting-balance support: No upper extremity supported, Feet supported Sitting balance-Leahy Scale: Normal   Postural control: Other (comment) Standing balance support: Bilateral  upper extremity supported, Single extremity supported Standing balance-Leahy Scale: Fair Standing balance comment:  swaying in all directions with CGA with increasd instability with turns         Pertinent Vitals/Pain Pain Assessment Pain Assessment: No/denies pain    Home Living Family/patient expects to be discharged to:: Private residence Living Arrangements: Alone Available Help at Discharge: Family;Home health Type of Home: Apartment Home Access: Level entry;Elevator       Home Layout: One level Home Equipment: Cane - single point      Prior Function Prior Level of Function : Independent/Modified Independent             Mobility Comments: Pt states that he got around with cane. Brothers were present and pt/brothers deny falls prior to hospitalization. Pt has some help at home but unclear as to how much.       Hand Dominance   Dominant Hand: Right    Extremity/Trunk Assessment   Upper Extremity Assessment Upper Extremity Assessment: Overall WFL for tasks assessed    Lower Extremity Assessment Lower Extremity Assessment: Overall WFL for tasks assessed    Cervical / Trunk Assessment Cervical / Trunk Assessment: Normal  Communication   Communication: Expressive difficulties  Cognition Arousal/Alertness: Awake/alert Behavior During Therapy: WFL for tasks assessed/performed Overall Cognitive Status: Within Functional Limits for tasks assessed                 Assessment/Plan    PT Assessment Patient needs continued PT services  PT Problem List Decreased strength;Decreased balance;Decreased mobility       PT Treatment Interventions Therapeutic activities;Gait training;Functional mobility training;Therapeutic exercise;Neuromuscular re-education;Patient/family education;Balance training    PT Goals (Current goals can be found in the Care Plan section)  Acute Rehab PT Goals Patient Stated Goal: Return home as soon as possible PT Goal Formulation: With patient Time For Goal Achievement: 09/18/22 Potential to Achieve Goals: Good    Frequency Min 2X/week         AM-PAC PT "6 Clicks" Mobility  Outcome Measure Help needed turning from your back to your side while in a flat bed without using bedrails?: None Help needed moving from lying on your back to sitting on the side of a flat bed without using bedrails?: None Help needed moving to and from a bed to a chair (including a wheelchair)?: A Little Help needed standing up from a chair using your arms (e.g., wheelchair or bedside chair)?: A Little Help needed to walk in hospital room?: A Little Help needed climbing 3-5 steps with a railing? : A Lot 6 Click Score: 19    End of Session Equipment Utilized During Treatment: Gait belt Activity Tolerance: Patient tolerated treatment well Patient left: with call bell/phone within reach;in chair;with chair alarm set;with family/visitor present Nurse Communication: Mobility status PT Visit Diagnosis: Unsteadiness on feet (R26.81);Muscle weakness (generalized) (M62.81)    Time: 8502-7741 PT Time Calculation (min) (ACUTE ONLY): 26 min   Charges:   PT Evaluation $PT Eval Low Complexity: 1 Low PT Treatments $Gait Training: 8-22 mins        Tomma Rakers, DPT, CLT  Acute Rehabilitation Services Office: [phone] (Secure chat preferred)   Ander Purpura 09/04/2022, 4:24 PM

## 2022-09-05 ENCOUNTER — Inpatient Hospital Stay (HOSPITAL_COMMUNITY): Payer: Medicare Other

## 2022-09-05 DIAGNOSIS — B1920 Unspecified viral hepatitis C without hepatic coma: Secondary | ICD-10-CM

## 2022-09-05 DIAGNOSIS — Z7189 Other specified counseling: Secondary | ICD-10-CM | POA: Diagnosis not present

## 2022-09-05 DIAGNOSIS — R160 Hepatomegaly, not elsewhere classified: Secondary | ICD-10-CM | POA: Diagnosis not present

## 2022-09-05 DIAGNOSIS — Z8673 Personal history of transient ischemic attack (TIA), and cerebral infarction without residual deficits: Secondary | ICD-10-CM

## 2022-09-05 DIAGNOSIS — N179 Acute kidney failure, unspecified: Secondary | ICD-10-CM | POA: Diagnosis not present

## 2022-09-05 DIAGNOSIS — E860 Dehydration: Secondary | ICD-10-CM

## 2022-09-05 DIAGNOSIS — F17210 Nicotine dependence, cigarettes, uncomplicated: Secondary | ICD-10-CM

## 2022-09-05 DIAGNOSIS — E8729 Other acidosis: Secondary | ICD-10-CM | POA: Diagnosis not present

## 2022-09-05 DIAGNOSIS — I10 Essential (primary) hypertension: Secondary | ICD-10-CM | POA: Diagnosis not present

## 2022-09-05 DIAGNOSIS — K7689 Other specified diseases of liver: Secondary | ICD-10-CM

## 2022-09-05 DIAGNOSIS — R531 Weakness: Secondary | ICD-10-CM

## 2022-09-05 DIAGNOSIS — K7030 Alcoholic cirrhosis of liver without ascites: Secondary | ICD-10-CM

## 2022-09-05 HISTORY — PX: IR PARACENTESIS: IMG2679

## 2022-09-05 LAB — CBC WITH DIFFERENTIAL/PLATELET
Abs Immature Granulocytes: 0.18 10*3/uL — ABNORMAL HIGH (ref 0.00–0.07)
Basophils Absolute: 0.1 10*3/uL (ref 0.0–0.1)
Basophils Relative: 1 %
Eosinophils Absolute: 0.2 10*3/uL (ref 0.0–0.5)
Eosinophils Relative: 1 %
HCT: 24.1 % — ABNORMAL LOW (ref 39.0–52.0)
Hemoglobin: 8.8 g/dL — ABNORMAL LOW (ref 13.0–17.0)
Immature Granulocytes: 1 %
Lymphocytes Relative: 14 %
Lymphs Abs: 2.2 10*3/uL (ref 0.7–4.0)
MCH: 32.6 pg (ref 26.0–34.0)
MCHC: 36.5 g/dL — ABNORMAL HIGH (ref 30.0–36.0)
MCV: 89.3 fL (ref 80.0–100.0)
Monocytes Absolute: 2.1 10*3/uL — ABNORMAL HIGH (ref 0.1–1.0)
Monocytes Relative: 13 %
Neutro Abs: 11.8 10*3/uL — ABNORMAL HIGH (ref 1.7–7.7)
Neutrophils Relative %: 70 %
Platelets: 219 10*3/uL (ref 150–400)
RBC: 2.7 MIL/uL — ABNORMAL LOW (ref 4.22–5.81)
RDW: 16.1 % — ABNORMAL HIGH (ref 11.5–15.5)
WBC: 16.6 10*3/uL — ABNORMAL HIGH (ref 4.0–10.5)
nRBC: 1.4 % — ABNORMAL HIGH (ref 0.0–0.2)

## 2022-09-05 LAB — COMPREHENSIVE METABOLIC PANEL
ALT: 32 U/L (ref 0–44)
AST: 44 U/L — ABNORMAL HIGH (ref 15–41)
Albumin: 3.1 g/dL — ABNORMAL LOW (ref 3.5–5.0)
Alkaline Phosphatase: 155 U/L — ABNORMAL HIGH (ref 38–126)
Anion gap: 12 (ref 5–15)
BUN: 27 mg/dL — ABNORMAL HIGH (ref 8–23)
CO2: 18 mmol/L — ABNORMAL LOW (ref 22–32)
Calcium: 10 mg/dL (ref 8.9–10.3)
Chloride: 108 mmol/L (ref 98–111)
Creatinine, Ser: 1.06 mg/dL (ref 0.61–1.24)
GFR, Estimated: >60 mL/min (ref >60)
Glucose, Bld: 97 mg/dL (ref 70–99)
Potassium: 3.6 mmol/L (ref 3.5–5.1)
Sodium: 138 mmol/L (ref 135–145)
Total Bilirubin: 2.1 mg/dL — ABNORMAL HIGH (ref 0.3–1.2)
Total Protein: 6.9 g/dL (ref 6.5–8.1)

## 2022-09-05 LAB — BODY FLUID CELL COUNT WITH DIFFERENTIAL
Eos, Fluid: 0 %
Lymphs, Fluid: 18 %
Monocyte-Macrophage-Serous Fluid: 19 % — ABNORMAL LOW (ref 50–90)
Neutrophil Count, Fluid: 63 % — ABNORMAL HIGH (ref 0–25)
Total Nucleated Cell Count, Fluid: 1795 cu mm — ABNORMAL HIGH (ref 0–1000)

## 2022-09-05 LAB — ALBUMIN, PLEURAL OR PERITONEAL FLUID: Albumin, Fluid: <1.5 g/dL

## 2022-09-05 LAB — GRAM STAIN

## 2022-09-05 LAB — MAGNESIUM: Magnesium: 1.5 mg/dL — ABNORMAL LOW (ref 1.7–2.4)

## 2022-09-05 LAB — SURGICAL PATHOLOGY

## 2022-09-05 LAB — PROTEIN, PLEURAL OR PERITONEAL FLUID: Total protein, fluid: <3 g/dL

## 2022-09-05 MED ORDER — OXYCODONE HCL 5 MG PO TABS: 10.0000 mg | ORAL_TABLET | Freq: Four times a day (QID) | ORAL | Status: DC | PRN

## 2022-09-05 MED ORDER — OXYCODONE HCL 5 MG PO TABS: 10.0000 mg | ORAL_TABLET | Freq: Four times a day (QID) | ORAL | Status: DC

## 2022-09-05 MED ORDER — MAGNESIUM SULFATE 2 GM/50ML IV SOLN
2.0000 g | Freq: Once | INTRAVENOUS | Status: AC
  Administered 2022-09-05: 2 g via INTRAVENOUS
  Filled 2022-09-05: qty 50

## 2022-09-05 MED ORDER — SODIUM CHLORIDE 0.9 % IV SOLN
2.0000 g | INTRAVENOUS | Status: DC
  Administered 2022-09-05 – 2022-09-07 (×3): 2 g via INTRAVENOUS
  Filled 2022-09-05 (×3): qty 20

## 2022-09-05 MED ORDER — ALBUMIN HUMAN 25 % IV SOLN
100.0000 g | Freq: Once | INTRAVENOUS | Status: AC
  Filled 2022-09-05: qty 400

## 2022-09-05 MED ORDER — OXYCODONE HCL 5 MG PO TABS
10.0000 mg | ORAL_TABLET | ORAL | Status: DC | PRN
  Administered 2022-09-05 – 2022-09-08 (×13): 10 mg via ORAL
  Filled 2022-09-05 (×14): qty 2

## 2022-09-05 MED ORDER — LIDOCAINE HCL 1 % IJ SOLN
INTRAMUSCULAR | Status: AC
  Filled 2022-09-05: qty 20

## 2022-09-05 MED ORDER — OXYCODONE HCL 5 MG PO TABS
10.0000 mg | ORAL_TABLET | ORAL | Status: AC
  Administered 2022-09-05: 10 mg via ORAL
  Filled 2022-09-05: qty 2

## 2022-09-05 MED ORDER — SODIUM BICARBONATE 650 MG PO TABS
650.0000 mg | ORAL_TABLET | Freq: Three times a day (TID) | ORAL | Status: DC
  Administered 2022-09-05 – 2022-09-08 (×10): 650 mg via ORAL
  Filled 2022-09-05 (×11): qty 1

## 2022-09-05 NOTE — Progress Notes (Signed)
Mobility Specialist - Progress Note   09/05/22 1100  Mobility  Activity Ambulated with assistance in hallway  Level of Assistance Standby assist, set-up cues, supervision of patient - no hands on  Assistive Device Front wheel walker  Distance Ambulated (ft) 300 ft  Activity Response Tolerated well  Mobility Referral Yes  $Mobility charge 1 Mobility    Pt received in bed agreeable to mobility. No complaints throughout, tolerated increased distance well. Left in bed w/ call bell in reach and all needs met.   [facility] Specialist Please contact via SecureChat or Rehab office at [phone]

## 2022-09-05 NOTE — Consult Note (Signed)
 [facility]  [phone]   HEMATOLOGY ONCOLOGY INPATIENT CONSULTATION   Jance D Tarte  DOB: 07/10/1961  MR#: [id number]  CSN#: [id number]    Requesting Physician: Hospital referral   Patient Care Team: Livengood, Jessica J, PA-C as PCP - General (Physician Assistant)  Reason for consult: Liver masses, likely HCC   History of present illness:   61 yo male with PMH of alcohol abuse, hepatitis C, liver cirrhosis, hypertension, who was admitted for weakness and falls at home.  He was admitted for acute renal failure from dehydration.  Workup showed liver masses with possible peritoneal metastasis, I was consulted for his presumed liver cancer.  Patient has been followed up by gastroenterologist Dr. Brahmbhatt for liver cirrhosis and hepatitis C.  Per patient, his hepatitis C was treated.  He was last seen in the office in October 2023, AFP was slightly elevated at 34, CT scan was planned but the patient did not follow-up.  His last screening abdominal ultrasound in March 2023 was negative for liver mass.  CT renal stone study on admission showed large mass in the right lobe of liver, this prompted an liver MRI which was done on September 03, 2022.  It showed diffuse hepatic tumor worse in the right hepatic lobe, bilobar involvement with extensive adenopathy in the upper abdomen, large right adrenal mass is suspected along the tumor implant at adjacent to the right adrenal and kidney, suspicious for peritoneal metastasis.  Patient underwent liver biopsy yesterday by IR.  Patient lives alone at home, with home aide.  He has 2 daughters.  He has a brother also helps him out.  He would like me to call his brother if needed.  He states he has been drinking beers at home, 2 bottles a day, before hospital admission.  He is a heavy smoker also.  He denies any illicit drug abuse. He is able to function at home.  He denies significant abdominal pain, nausea, he states he has been  eating well, no weight loss.  MEDICAL HISTORY:  Past Medical History:  Diagnosis Date   Ascites    Chronic left hip pain    Cirrhosis (HCC)    Hypertension    Stroke (HCC)    TIA (transient ischemic attack) 06/23/2016    SURGICAL HISTORY: Past Surgical History:  Procedure Laterality Date   BACK SURGERY     CERVICAL SPINE SURGERY     ESOPHAGEAL BANDING N/A 01/25/2021   Procedure: ESOPHAGEAL BANDING;  Surgeon: Brahmbhatt, Parag, MD;  Location: [facility];  Service: Gastroenterology;  Laterality: N/A;   ESOPHAGOGASTRODUODENOSCOPY (EGD) WITH PROPOFOL N/A 01/25/2021   Procedure: ESOPHAGOGASTRODUODENOSCOPY (EGD) WITH PROPOFOL;  Surgeon: Brahmbhatt, Parag, MD;  Location: [facility];  Service: Gastroenterology;  Laterality: N/A;   ESOPHAGOGASTRODUODENOSCOPY (EGD) WITH PROPOFOL N/A 05/21/2021   Procedure: ESOPHAGOGASTRODUODENOSCOPY (EGD) WITH PROPOFOL;  Surgeon: Brahmbhatt, Parag, MD;  Location: [facility];  Service: Gastroenterology;  Laterality: N/A;   JOINT REPLACEMENT      SOCIAL HISTORY: Social History   Socioeconomic History   Marital status: Married    Spouse name: Not on file   Number of children: Not on file   Years of education: Not on file   Highest education level: Not on file  Occupational History   Not on file  Tobacco Use   Smoking status: Every Day    Types: Cigarettes   Smokeless tobacco: Never   Tobacco comments:    1/2 pack per day  Substance and Sexual Activity     Alcohol use: Yes    Comment: 2 beers/day   Drug use: No   Sexual activity: Not on file  Other Topics Concern   Not on file  Social History Narrative   Not on file   Social Determinants of Health   Financial Resource Strain: Not on file  Food Insecurity: Not on file  Transportation Needs: Not on file  Physical Activity: Not on file  Stress: Not on file  Social Connections: Not on file  Intimate Partner Violence: Not on file    FAMILY HISTORY: Family History  Family history unknown:  Yes    ALLERGIES:  has No Known Allergies.  MEDICATIONS:  Current Facility-Administered Medications  Medication Dose Route Frequency Provider Last Rate Last Admin   acetaminophen (TYLENOL) tablet 650 mg  650 mg Oral Q6H PRN Marcelyn Bruins, MD   650 mg at 09/04/22 2203   Or   acetaminophen (TYLENOL) suppository 650 mg  650 mg Rectal Q6H PRN Marcelyn Bruins, MD       folic acid (FOLVITE) tablet 1 mg  1 mg Oral Daily Marcelyn Bruins, MD   1 mg at 09/03/22 0818   gelatin adsorbable (GELFOAM/SURGIFOAM) sponge 12-7 mm 1 each  1 each Topical Once Mir, Paula Libra, MD       heparin injection 5,000 Units  5,000 Units Subcutaneous Q8H Monia Sabal, PA-C       lactulose (CHRONULAC) 10 GM/15ML solution 20 g  20 g Oral BID Darliss Cheney, MD   20 g at 09/04/22 2203   lidocaine (PF) (XYLOCAINE) 1 % injection 5 mL  5 mL Intradermal Once Mir, Paula Libra, MD       magnesium sulfate IVPB 2 g 50 mL  2 g Intravenous Once Aline August, MD       oxyCODONE (Oxy IR/ROXICODONE) immediate release tablet 10 mg  10 mg Oral Q6H PRN Aline August, MD   10 mg at 09/04/22 2203   pantoprazole (PROTONIX) EC tablet 40 mg  40 mg Oral BID Marcelyn Bruins, MD   40 mg at 09/04/22 2202   polyethylene glycol (MIRALAX / GLYCOLAX) packet 17 g  17 g Oral Daily PRN Marcelyn Bruins, MD       propranolol (INDERAL) tablet 10 mg  10 mg Oral BID Marcelyn Bruins, MD   10 mg at 09/04/22 2203   rifaximin (XIFAXAN) tablet 550 mg  550 mg Oral BID Otis Brace, MD   550 mg at 09/04/22 2202   sodium chloride flush (NS) 0.9 % injection 3 mL  3 mL Intravenous Q12H Marcelyn Bruins, MD   3 mL at 09/04/22 2205   thiamine (VITAMIN B1) tablet 100 mg  100 mg Oral Daily Marcelyn Bruins, MD   100 mg at 09/03/22 0818   tiZANidine (ZANAFLEX) tablet 4 mg  4 mg Oral TID PRN Darliss Cheney, MD   4 mg at 09/04/22 2202    REVIEW OF SYSTEMS:   Constitutional: Denies fevers, chills or abnormal night sweats Eyes: Denies  blurriness of vision, double vision or watery eyes Ears, nose, mouth, throat, and face: Denies mucositis or sore throat Respiratory: Denies cough, dyspnea or wheezes Cardiovascular: Denies palpitation, chest discomfort or lower extremity swelling Gastrointestinal:  Denies nausea, heartburn or change in bowel habits Skin: Denies abnormal skin rashes Lymphatics: Denies new lymphadenopathy or easy bruising Neurological:Denies numbness, tingling or new weaknesses Behavioral/Psych: Mood is stable, no new changes  All other systems were reviewed with the patient and are  negative.  PHYSICAL EXAMINATION: ECOG PERFORMANCE STATUS: 2 - Symptomatic, <50% confined to bed  Vitals:   09/05/22 0553 09/05/22 0852  BP: (!) 133/97 102/65  Pulse: 80 94  Resp: 18 16  Temp: 98 F (36.7 C) 98.2 F (36.8 C)  SpO2: 100% 100%   There were no vitals filed for this visit.  GENERAL:alert, no distress and comfortable SKIN: skin color, texture, turgor are normal, no rashes or significant lesions EYES: normal, conjunctiva are pink and non-injected, sclera clear OROPHARYNX:no exudate, no erythema and lips, buccal mucosa, and tongue normal  NECK: supple, thyroid normal size, non-tender, without nodularity LYMPH:  no palpable lymphadenopathy in the cervical, axillary or inguinal LUNGS: clear to auscultation and percussion with normal breathing effort HEART: regular rate & rhythm and no murmurs and no lower extremity edema ABDOMEN:abdomen soft, distended, (+) significantly enlarged liver with moderate tenderness. Musculoskeletal:no cyanosis of digits and no clubbing  PSYCH: alert & oriented x 3 with fluent speech NEURO: no focal motor/sensory deficits  LABORATORY DATA:  I have reviewed the data as listed Lab Results  Component Value Date   WBC 16.6 (H) 09/05/2022   HGB 8.8 (L) 09/05/2022   HCT 24.1 (L) 09/05/2022   MCV 89.3 09/05/2022   PLT 219 09/05/2022   Recent Labs    09/03/22 0308 09/04/22 0252  09/05/22 0258  NA 138 142 138  K 3.7 3.7 3.6  CL 109 111 108  CO2 19* 20* 18*  GLUCOSE 98 101* 97  BUN 49* 30* 27*  CREATININE 1.42* 1.01 1.06  CALCIUM 9.5 10.0 10.0  GFRNONAA 56* >60 >60  PROT 6.8 7.1 6.9  ALBUMIN 2.9* 3.4* 3.1*  AST 42* 36 44*  ALT 32 31 32  ALKPHOS 167* 139* 155*  BILITOT 2.2* 2.6* 2.1*    RADIOGRAPHIC STUDIES: I have personally reviewed the radiological images as listed and agreed with the findings in the report. US BIOPSY (LIVER)  Result Date: 09/04/2022 INDICATION: Multiple liver lesions EXAM: Ultrasound-guided liver mass biopsy MEDICATIONS: None. ANESTHESIA/SEDATION: Moderate (conscious) sedation was employed during this procedure. A total of Versed 0.5 mg and Fentanyl 50 mcg was administered intravenously by the radiology nurse. Total intra-service moderate Sedation Time: 13 minutes. The patient's level of consciousness and vital signs were monitored continuously by radiology nursing throughout the procedure under my direct supervision. COMPLICATIONS: None immediate. PROCEDURE: Informed written consent was obtained from the patient after a thorough discussion of the procedural risks, benefits and alternatives. All questions were addressed. Maximal Sterile Barrier Technique was utilized including caps, mask, sterile gowns, sterile gloves, sterile drape, hand hygiene and skin antiseptic. A timeout was performed prior to the initiation of the procedure. Patient position supine on the ultrasound table. Epigastric skin prepped and draped in usual sterile fashion. Following local lidocaine administration, 17 gauge introducer needle was advanced into 1 of the left liver masses, and 4- 18 gauge cores were obtained utilizing continuous ultrasound guidance. Gelfoam slurry was administered through the introducer needle at the biopsy site. Samples were sent to pathology in formalin. Needle removed and hemostasis achieved with 5 minutes of manual compression. Post procedure  ultrasound images showed no evidence of significant hemorrhage. IMPRESSION: Ultrasound-guided biopsy of left liver mass. Electronically Signed   By: Farhaan  Mir M.D.   On: 09/04/2022 13:52   MR LIVER WO CONRTAST  Result Date: 09/03/2022 CLINICAL DATA:  Suspected liver lesions in a 61-year-old male. No contrast was administered for this exam. Only limited imaging was acquired at patient request. Patient   asked to discontinue the exam. EXAM: MRI ABDOMEN WITHOUT CONTRAST TECHNIQUE: Multiplanar multisequence MR imaging was performed without the administration of intravenous contrast. COMPARISON:  CT of the abdomen and pelvis performed on September 01, 2022. FINDINGS: Lower chest: Incidental imaging of the lower chest is unremarkable to the extent evaluated aside from small bilateral pleural effusions, very limited assessment on this abdominal MRI. Hepatobiliary: Nodular surface contour the liver compatible with cirrhotic morphology though assessment limited due to the markedly abnormal appearance of the liver which is filled with tumor. Dominant lesion in the RIGHT hepatic lobe 8.6 x 6.7 cm (image 17/4) tumor displays bilobar involvement and is greatest on the RIGHT. Next largest hepatic tumor as a discrete lesion that can be visualized in the RIGHT hepatic lobe is in the range of 6.3 cm. Tumors ranging between 2.5 and 2.8 cm in the lateral segment of the LEFT hepatic lobe (image 21/4. Portal vein cannot be assessed but may be involved with tumor and or displaced. Area most suspicious for portal vein involvement seen on image 20/4 with some expansion and intermediate signal that is contiguous with celiac adenopathy. No gross biliary duct dilation. Edematous appearing gallbladder with cholelithiasis remains nonspecific in the setting of generalized edema throughout the abdomen. Pancreas: Not well assessed. Generalized edema throughout the abdomen. Masses which are contiguous with the pancreas but potentially  represent lymph nodes, for instance on image 30/4 an area measuring 3.3 cm is more likely a necrotic lymph node but cannot be well evaluated. No ductal dilation of the pancreas. Spleen:  Spleen is top-normal size. Adrenals/Urinary Tract: No hydronephrosis. Large RIGHT adrenal mass is suspected along with tumor implant adjacent to the RIGHT adrenal and RIGHT kidney. Tumor implant on image 19/42.6 cm. RIGHT adrenal inseparable from medial RIGHT hepatic lobe mass. LEFT adrenal gland is normal. Stomach/Bowel: Extensive bowel edema of uncertain significance in this patient with suspected liver disease. Bowel not well assessed. Vascular/Lymphatic: Normal caliber of the abdominal aorta. Potential portal vein invasion from tumor. Adenopathy in the retroperitoneum and upper abdomen. Other: Small volume ascites and diffuse mesenteric and body wall edema, suspect this is slightly increased compared to previous imaging. Musculoskeletal: No gross abnormalities, limited assessment. IMPRESSION: 1. Findings of diffuse hepatic tumor worse in the RIGHT hepatic lobe, bilobar involvement with extensive adenopathy in the upper abdomen. 2. Question of portal vein involvement though this areas not well assessed. Study is highly limited due to motion and incomplete acquisition of even sequences that would constitute a noncontrast abdominal MRI. No contrast was given in the study was terminated early at patient request. 3. Findings raise the question of primary hepatic neoplasm with metastatic disease in the abdomen. Tumor marker correlation may be helpful. In the event the tumor markers are equivocal tissue sampling may be helpful. 4. Developing anasarca. Electronically Signed   By: Zetta Bills M.D.   On: 09/03/2022 17:49   MR BRAIN WO CONTRAST  Result Date: 09/01/2022 CLINICAL DATA:  Acute neurologic deficit EXAM: MRI HEAD WITHOUT CONTRAST TECHNIQUE: Multiplanar, multiecho pulse sequences of the brain and surrounding structures  were obtained without intravenous contrast. COMPARISON:  07/28/2020 FINDINGS: Brain: No acute infarct, mass effect or extra-axial collection. No acute or chronic hemorrhage. There is multifocal hyperintense T2-weighted signal within the white matter. Generalized volume loss. The midline structures are normal. Vascular: Major flow voids are preserved. Skull and upper cervical spine: Normal calvarium and skull base. Visualized upper cervical spine and soft tissues are normal. Sinuses/Orbits:No paranasal sinus fluid levels or advanced  mucosal thickening. No mastoid or middle ear effusion. 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 No acute intracranial abnormality. 2. Findings of chronic small vessel ischemia and volume loss. Electronically Signed   By: Kevin  Herman M.D.   On: 09/01/2022 18:54   CT Renal Stone Study  Result Date: 09/01/2022 CLINICAL DATA:  Abdominal and flank pain EXAM: CT ABDOMEN AND PELVIS WITHOUT CONTRAST TECHNIQUE: Multidetector CT imaging of the abdomen and pelvis was performed following the standard protocol without IV contrast. RADIATION DOSE REDUCTION: This exam was performed according to the departmental dose-optimization program which includes automated exposure control, adjustment of the mA and/or kV according to patient size and/or use of iterative reconstruction technique. COMPARISON:  Abdominal ultrasound 12/04/2021 and CT abdomen 07/27/2020 FINDINGS: Lower chest: Paraseptal emphysema. Left anterior descending and right coronary artery atherosclerosis. Bilateral gynecomastia. Right paraesophageal node 2.1 cm in short axis on image 13 series 3, new compared to the prior exam. Hepatobiliary: Indistinct mass density within and along the medial margin the right hepatic lobe substantially abuts the right kidney upper pole. I suspect that this is probably arising from the liver given the degree of hepatic heterogeneity which may represent other lesions in  the right hepatic lobe and segment 4. Nodular liver contour possibly from cirrhosis or pseudo cirrhosis. Underlying mass difficult to measure but potentially around 10 cm in diameter. Right adrenal gland indistinguishable from the mass. The dominant mass likewise substantially abuts the IVC and may extend in the right porta hepatis region adjacent to the pancreatic head. 1.9 cm gallstone in the gallbladder. Pancreas: As noted above there is indistinctness of fat planes around the pancreatic head possibly due to extension of the hepatic mass in this vicinity, or local adenopathy. A pancreatic primary malignancy is not excluded. Spleen: Unremarkable Adrenals/Urinary Tract: The right adrenal gland is surrounded by indistinct density favoring tumor. Left adrenal gland unremarkable. Right kidney upper pole abutted by the right mass extending along the hepatic renal space. Nonobstructive 0.5 cm right kidney lower pole calculus. Urinary bladder unremarkable. Stomach/Bowel: Direct right inguinal hernia containing a loop of bowel with an air-level and possibly a small amount of ascites. No dilated bowel extending into this hernia to suggest obstruction. No pneumatosis. Vascular/Lymphatic: The mass along the right hepatic lobe is substantially in the vicinity of the IVC and may be exerting mass effect on the IVC. There appears to be porta hepatis and peripancreatic adenopathy, including a 2.8 cm portacaval lymph node and a 2.2 cm peripancreatic node on images 37 and 24 of series 3, respectively. Right retrocrural node 1.1 cm in short axis, image 19 series 3. Aortocaval node 1.6 cm in short axis, image 42 series 3. Separate aortocaval node 2.3 cm in short axis, image 50 series 3. Atherosclerosis is present, including aortoiliac atherosclerotic disease. Reproductive: Unremarkable Other: Trace edema along the right paracolic gutter. Musculoskeletal: Left total hip prosthesis introducing streak artifact. 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 In conjunction with lumbar spondylosis and degenerative disc disease there is substantial bilateral foraminal impingement at L3-4, L4-5, and L5-S1. Old posterior fractures the right eleventh and twelfth ribs. IMPRESSION: 1. Large mass along the medial margin of the right hepatic lobe substantially in the vicinity of the IVC and right adrenal gland, probably arising from the liver given the degree of heterogeneity of the liver parenchyma. There is pathologic porta hepatis, peripancreatic, retrocrural, lower   thoracic paraesophageal, and retroperitoneal adenopathy. The appearance is highly suspicious for malignancy, probably hepatocellular carcinoma with extrahepatic extension given the underlying cirrhosis, although other malignancies are not excluded. The dominant mass angles the left adrenal gland and substantially abuts the IVC and right kidney upper pole. Oncology referral and tissue diagnosis is recommended. Comprehensive staging imaging such as nuclear medicine PET-CT recommended. Patency of the IVC and portal vein could be affected by this mass, but not assessed on noncontrast CT. 2. Direct right inguinal hernia containing a loop of bowel with an air-level and possibly a small amount of ascites. No dilated bowel extending into this hernia to suggest obstruction. 3. Nodular liver contour possibly from cirrhosis or less likely, pseudo-cirrhosis. 4. Other imaging findings of potential clinical significance: Coronary atherosclerosis. Paraseptal emphysema. Cholelithiasis. Nonobstructive right kidney lower pole calculus. 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 Lumbar spondylosis and degenerative disc disease causing substantial bilateral foraminal impingement at L3-4, L4-5, and L5-S1. Old posterior fractures the right eleventh and twelfth ribs. Aortic and systemic atherosclerosis.  Aortic Atherosclerosis (ICD10-I70.0) and Emphysema (ICD10-J43.9). Electronically Signed   By: Walter  Liebkemann M.D.   On: 09/01/2022 15:36   CT Head Wo Contrast  Result Date: 09/01/2022 CLINICAL DATA:  Mental status changes, weakness EXAM: CT HEAD WITHOUT CONTRAST TECHNIQUE: Contiguous axial images were obtained from the base of the skull through the vertex without intravenous contrast. RADIATION DOSE REDUCTION: This exam was performed according to the departmental dose-optimization program which includes automated exposure control, adjustment of the mA and/or kV according to patient size and/or use of iterative reconstruction technique. COMPARISON:  07/27/2020 FINDINGS: Brain: Stable mild atrophy pattern without acute intracranial hemorrhage, mass lesion, new infarction, midline shift, herniation, hydrocephalus, or extra-axial fluid collection. No focal mass effect or edema. Cisterns are patent. No cerebellar abnormality. Vascular: No hyperdense vessel or unexpected calcification. Skull: Normal. Negative for fracture or focal lesion. Sinuses/Orbits: Clear sinuses. No acute orbital abnormality. Chronic calcifications of the right globe. Remote right orbital fracture medially. Other: None. IMPRESSION: Stable mild atrophy pattern. No acute intracranial abnormality by noncontrast CT. No interval change. Electronically Signed   By: M.  Shick M.D.   On: 09/01/2022 11:00   DG Chest 2 View  Result Date: 09/01/2022 CLINICAL DATA:  Weakness, shortness of breath. EXAM: CHEST - 2 VIEW COMPARISON:  Chest radiographs dated August 05, 2020 FINDINGS: The heart size and mediastinal contours are within normal limits. Both lungs are clear. Moderate bilateral acromioclavicular osteoarthritis. Mild thoracic spondylosis. IMPRESSION: 1. No active cardiopulmonary disease. 2. Moderate bilateral acromioclavicular osteoarthritis. Electronically Signed   By: Imran  Ahmed D.O.   On: 09/01/2022 10:24    ASSESSMENT & PLAN:  61-year-old gentleman with hepatitis C, alcohol liver cirrhosis, was admitted for AKI, CT scan revealed multiple liver masses.  Bilobar liver masses, with adenopathy and probable peritoneal metastasis Acute renal failure from dehydration, resolved Alcohol abuse and heavy smoker Liver cirrhosis from alcohol and hepatitis C Hypertension  Recommendations: -I personally reviewed his CT and MRI images, which is highly concerning for metastatic cancer.  Given the liver cirrhosis, significantly elevated AFP, this is likely hepatocellular carcinoma.  He underwent a liver biopsy by IR yesterday, results still pending. -Will obtain CT chest to complete staging -Given the high disease burden, rapid progression (negative US in 11/2021) , his HCC appears to be very agressive  -I briefly discussed metastatic HCC treatment, including immunotherapy, EGFR inhibitor, and oral TKI's.He is open to treatment.  -I will   see him back in office next week, and I encouraged his brother coming with him. -I will call his brother.  All questions were answered. The patient knows to call the clinic with any problems, questions or concerns.       , MD 09/05/2022 10:08 AM    

## 2022-09-05 NOTE — Progress Notes (Signed)
PROGRESS NOTE    Calvin [name]  ZOX:[id number] DOB: 1960/12/25 DOA: 09/01/2022 PCP: Jenny Reichmann, PA-C   Brief Narrative:  61 y.o. male with medical history significant of hyperlipidemia, hypertension, anemia, alcohol use, TIA/stroke, cirrhosis, hepatitis C, right eye blindness presented with weakness and fall.  On presentation, creatinine was 5.4 from baseline of 1.4 with AST of 59, ALT 55, ALP 231, T. bili of 2 and WBC of 17.6.  Respiratory panel for flu and COVID-19 were negative.  Chest x-ray showed no acute abnormality.  CT head without contrast showed no acute abnormality.  CT renal stone study showed large mass at the right hepatic lobe near the IVC with reactive adenopathy suspicious for malignancy along with inguinal hernia and cirrhosis.  He was started on IV fluids.  GI was consulted.  MRI showed diffuse hepatic tumor.  He underwent a liver biopsy by IR on 09/04/2022.  Assessment & Plan:   AKI Uremia Acute metabolic acidosis -Creatinine 5.4 on presentation with baseline of 1.4.  Treated with IV fluids.  Creatinine improving to 1.06 today.  Off IV fluids.  Repeat a.m. labs.  Still slightly acidotic.  Start oral sodium bicarbonate tablets.  Liver mass Elevated LFTs Possible cirrhosis of liver from history of chronic hepatitis C and alcohol abuse Possible hepatoencephalopathy -GI following.  MRI of abdomen shows findings of diffuse hepatic tumor: Findings raise the question of primary hepatic neoplasm with metastatic disease in the abdomen with developing anasarca.  Status post liver biopsy by IR on 09/04/2022.  Follow pathology.  Oncology has been consulted.-AFP significantly elevated -CT/MRI of brain without contrast was negative for acute intracranial abnormality -Continue propranolol, rifaximin and lactulose.  Might have to resume Lasix at some point. -palliative care has also been consulted for goals of care discussion  Leukocytosis -Slightly improving but still  significant.  Repeat a.m. labs  Anemia of chronic disease -Possibly from chronic illnesses.  Hemoglobin stable.  Monitor intermittently  Essential hypertension -Blood pressure still on the lower side.  Amlodipine and Lasix on hold.  Hypomagnesemia -Replace.  Repeat a.m. labs.  Physical deconditioning -PT recommends home health PT   DVT prophylaxis: Heparin subcutaneous Code Status: DNR Family Communication: None at bedside Disposition Plan: Status is: Inpatient Remains inpatient appropriate because: Of severity of illness  Consultants: GI/IR/oncology/palliative care  Procedures: Liver biopsy by IR on 09/04/2022  Antimicrobials: None   Subjective: Patient seen and examined at bedside.  Poor historian.  Feels slightly better but feels weak.  Denies worsening abdominal pain, nausea or vomiting.  No seizures reported. Objective: Vitals:   09/04/22 1830 09/04/22 2202 09/05/22 0553 09/05/22 0852  BP: 114/76 110/75 (!) 133/97 102/65  Pulse: 72 73 80 94  Resp: '16 14 18 16  '$ Temp: 98.1 F (36.7 C) 97.6 F (36.4 C) 98 F (36.7 C) 98.2 F (36.8 C)  TempSrc: Oral Oral Oral Oral  SpO2: 100% 100% 100% 100%    Intake/Output Summary (Last 24 hours) at 09/05/2022 1045 Last data filed at 09/04/2022 1441 Gross per 24 hour  Intake 100 ml  Output --  Net 100 ml    There were no vitals filed for this visit.  Examination:  General: No acute distress currently.  Still on room air.  Looks chronically ill and deconditioned. ENT/neck: No obvious neck masses or JVD elevation noted  respiratory: Bilateral decreased breath sounds at bases with scattered crackles CVS: Currently rate controlled; S1 and S2 are heard Abdominal: Soft, slightly tender diffusely, still slightly distended; no organomegaly,  bowel sounds heard normally Extremities: No clubbing; mild lower extremity edema present CNS: Awake but still very slow to respond.  Poor historian.  No focal neurologic deficit.  Able to  move extremities Lymph: No palpable cervical lymphadenopathy  skin: No obvious petechiae/rashes psych: Flat affect.  Not agitated  musculoskeletal: No obvious joint erythema/tenderness    Data Reviewed: I have personally reviewed following labs and imaging studies  CBC: Recent Labs  Lab 09/01/22 0945 09/02/22 0210 09/03/22 0308 09/04/22 0252 09/05/22 0258  WBC 17.6* 15.9* 14.0* 17.1* 16.6*  NEUTROABS 13.2*  --  10.3* 13.5* 11.8*  HGB 11.0* 10.1* 8.8* 8.7* 8.8*  HCT 31.5* 28.0* 23.6* 23.8* 24.1*  MCV 92.1 87.8 87.1 88.8 89.3  PLT 473* 320 237 219 161    Basic Metabolic Panel: Recent Labs  Lab 09/01/22 0945 09/02/22 0210 09/03/22 0308 09/04/22 0252 09/05/22 0258  NA 133* 135 138 142 138  K 4.2 3.4* 3.7 3.7 3.6  CL 99 102 109 111 108  CO2 14* 18* 19* 20* 18*  GLUCOSE 144* 118* 98 101* 97  BUN 90* 76* 49* 30* 27*  CREATININE 5.40* 3.25* 1.42* 1.01 1.06  CALCIUM 9.4 9.3 9.5 10.0 10.0  MG 2.7*  --   --  1.3* 1.5*    GFR: CrCl cannot be calculated (Unknown ideal weight.). Liver Function Tests: Recent Labs  Lab 09/01/22 0945 09/02/22 0210 09/03/22 0308 09/04/22 0252 09/05/22 0258  AST 59* 47* 42* 36 44*  ALT 55* 48* 32 31 32  ALKPHOS 231* 208* 167* 139* 155*  BILITOT 2.0* 2.0* 2.2* 2.6* 2.1*  PROT 8.8* 7.7 6.8 7.1 6.9  ALBUMIN 2.5* 2.6* 2.9* 3.4* 3.1*    Recent Labs  Lab 09/01/22 0945  LIPASE 42    Recent Labs  Lab 09/01/22 2121 09/04/22 0252  AMMONIA 71* 46*    Coagulation Profile: Recent Labs  Lab 09/01/22 2121 09/02/22 0210 09/04/22 0648  INR 1.5* 1.6* 1.7*    Cardiac Enzymes: No results for input(s): "CKTOTAL", "CKMB", "CKMBINDEX", "TROPONINI" in the last 168 hours. BNP (last 3 results) No results for input(s): "PROBNP" in the last 8760 hours. HbA1C: No results for input(s): "HGBA1C" in the last 72 hours. CBG: No results for input(s): "GLUCAP" in the last 168 hours. Lipid Profile: No results for input(s): "CHOL", "HDL",  "LDLCALC", "TRIG", "CHOLHDL", "LDLDIRECT" in the last 72 hours. Thyroid Function Tests: No results for input(s): "TSH", "T4TOTAL", "FREET4", "T3FREE", "THYROIDAB" in the last 72 hours. Anemia Panel: No results for input(s): "VITAMINB12", "FOLATE", "FERRITIN", "TIBC", "IRON", "RETICCTPCT" in the last 72 hours. Sepsis Labs: No results for input(s): "PROCALCITON", "LATICACIDVEN" in the last 168 hours.  Recent Results (from the past 240 hour(s))  Resp panel by RT-PCR (RSV, Flu A&B, Covid) Anterior Nasal Swab     Status: None   Collection Time: 09/01/22  1:49 PM   Specimen: Anterior Nasal Swab  Result Value Ref Range Status   SARS Coronavirus 2 by RT PCR NEGATIVE NEGATIVE Final    Comment: (NOTE) SARS-CoV-2 target nucleic acids are NOT DETECTED.  The SARS-CoV-2 RNA is generally detectable in upper respiratory specimens during the acute phase of infection. The lowest concentration of SARS-CoV-2 viral copies this assay can detect is 138 copies/mL. A negative result does not preclude SARS-Cov-2 infection and should not be used as the sole basis for treatment or other patient management decisions. A negative result may occur with  improper specimen collection/handling, submission of specimen other than nasopharyngeal swab, presence of viral mutation(s) within the  areas targeted by this assay, and inadequate number of viral copies(<138 copies/mL). A negative result must be combined with clinical observations, patient history, and epidemiological information. The expected result is Negative.  Fact Sheet for Patients:  EntrepreneurPulse.com.au  Fact Sheet for Healthcare Providers:  IncredibleEmployment.be  This test is no t yet approved or cleared by the [place] FDA and  has been authorized for detection and/or diagnosis of SARS-CoV-2 by FDA under an Emergency Use Authorization (EUA). This EUA will remain  in effect (meaning this test can be used)  for the duration of the COVID-19 declaration under Section 564(b)(1) of the Act, 21 U.S.C.section 360bbb-3(b)(1), unless the authorization is terminated  or revoked sooner.       Influenza A by PCR NEGATIVE NEGATIVE Final   Influenza B by PCR NEGATIVE NEGATIVE Final    Comment: (NOTE) The Xpert Xpress SARS-CoV-2/FLU/RSV plus assay is intended as an aid in the diagnosis of influenza from Nasopharyngeal swab specimens and should not be used as a sole basis for treatment. Nasal washings and aspirates are unacceptable for Xpert Xpress SARS-CoV-2/FLU/RSV testing.  Fact Sheet for Patients: EntrepreneurPulse.com.au  Fact Sheet for Healthcare Providers: IncredibleEmployment.be  This test is not yet approved or cleared by the [place] FDA and has been authorized for detection and/or diagnosis of SARS-CoV-2 by FDA under an Emergency Use Authorization (EUA). This EUA will remain in effect (meaning this test can be used) for the duration of the COVID-19 declaration under Section 564(b)(1) of the Act, 21 U.S.C. section 360bbb-3(b)(1), unless the authorization is terminated or revoked.     Resp Syncytial Virus by PCR NEGATIVE NEGATIVE Final    Comment: (NOTE) Fact Sheet for Patients: EntrepreneurPulse.com.au  Fact Sheet for Healthcare Providers: IncredibleEmployment.be  This test is not yet approved or cleared by the [place] FDA and has been authorized for detection and/or diagnosis of SARS-CoV-2 by FDA under an Emergency Use Authorization (EUA). This EUA will remain in effect (meaning this test can be used) for the duration of the COVID-19 declaration under Section 564(b)(1) of the Act, 21 U.S.C. section 360bbb-3(b)(1), unless the authorization is terminated or revoked.  Performed at [facility], [place] [address], [place], [place] [address]          Radiology Studies: US BIOPSY  (LIVER)  Result Date: 09/04/2022 INDICATION: Multiple liver lesions EXAM: Ultrasound-guided liver mass biopsy MEDICATIONS: None. ANESTHESIA/SEDATION: Moderate (conscious) sedation was employed during this procedure. A total of Versed 0.5 mg and Fentanyl 50 mcg was administered intravenously by the radiology nurse. Total intra-service moderate Sedation Time: 13 minutes. The patient's level of consciousness and vital signs were monitored continuously by radiology nursing throughout the procedure under my direct supervision. COMPLICATIONS: None immediate. PROCEDURE: Informed written consent was obtained from the patient after a thorough discussion of the procedural risks, benefits and alternatives. All questions were addressed. Maximal Sterile Barrier Technique was utilized including caps, mask, sterile gowns, sterile gloves, sterile drape, hand hygiene and skin antiseptic. A timeout was performed prior to the initiation of the procedure. Patient position supine on the ultrasound table. Epigastric skin prepped and draped in usual sterile fashion. Following local lidocaine administration, 17 gauge introducer needle was advanced into 1 of the left liver masses, and 4- 18 gauge cores were obtained utilizing continuous ultrasound guidance. Gelfoam slurry was administered through the introducer needle at the biopsy site. Samples were sent to pathology in formalin. Needle removed and hemostasis achieved with 5 minutes of manual compression. Post procedure ultrasound images  showed no evidence of significant hemorrhage. IMPRESSION: Ultrasound-guided biopsy of left liver mass. Electronically Signed   By: Miachel Roux M.D.   On: 09/04/2022 13:52   MR LIVER WO CONRTAST  Result Date: 09/03/2022 CLINICAL DATA:  Suspected liver lesions in a 61 year old male. No contrast was administered for this exam. Only limited imaging was acquired at patient request. Patient asked to discontinue the exam. EXAM: MRI ABDOMEN WITHOUT  CONTRAST TECHNIQUE: Multiplanar multisequence MR imaging was performed without the administration of intravenous contrast. COMPARISON:  CT of the abdomen and pelvis performed on September 01, 2022. FINDINGS: Lower chest: Incidental imaging of the lower chest is unremarkable to the extent evaluated aside from small bilateral pleural effusions, very limited assessment on this abdominal MRI. Hepatobiliary: Nodular surface contour the liver compatible with cirrhotic morphology though assessment limited due to the markedly abnormal appearance of the liver which is filled with tumor. Dominant lesion in the RIGHT hepatic lobe 8.6 x 6.7 cm (image 17/4) tumor displays bilobar involvement and is greatest on the RIGHT. Next largest hepatic tumor as a discrete lesion that can be visualized in the RIGHT hepatic lobe is in the range of 6.3 cm. Tumors ranging between 2.5 and 2.8 cm in the lateral segment of the LEFT hepatic lobe (image 21/4. Portal vein cannot be assessed but may be involved with tumor and or displaced. Area most suspicious for portal vein involvement seen on image 20/4 with some expansion and intermediate signal that is contiguous with celiac adenopathy. No gross biliary duct dilation. Edematous appearing gallbladder with cholelithiasis remains nonspecific in the setting of generalized edema throughout the abdomen. Pancreas: Not well assessed. Generalized edema throughout the abdomen. Masses which are contiguous with the pancreas but potentially represent lymph nodes, for instance on image 30/4 an area measuring 3.3 cm is more likely a necrotic lymph node but cannot be well evaluated. No ductal dilation of the pancreas. Spleen:  Spleen is top-normal size. Adrenals/Urinary Tract: No hydronephrosis. Large RIGHT adrenal mass is suspected along with tumor implant adjacent to the RIGHT adrenal and RIGHT kidney. Tumor implant on image 19/42.6 cm. RIGHT adrenal inseparable from medial RIGHT hepatic lobe mass. LEFT  adrenal gland is normal. Stomach/Bowel: Extensive bowel edema of uncertain significance in this patient with suspected liver disease. Bowel not well assessed. Vascular/Lymphatic: Normal caliber of the abdominal aorta. Potential portal vein invasion from tumor. Adenopathy in the retroperitoneum and upper abdomen. Other: Small volume ascites and diffuse mesenteric and body wall edema, suspect this is slightly increased compared to previous imaging. Musculoskeletal: No gross abnormalities, limited assessment. IMPRESSION: 1. Findings of diffuse hepatic tumor worse in the RIGHT hepatic lobe, bilobar involvement with extensive adenopathy in the upper abdomen. 2. Question of portal vein involvement though this areas not well assessed. Study is highly limited due to motion and incomplete acquisition of even sequences that would constitute a noncontrast abdominal MRI. No contrast was given in the study was terminated early at patient request. 3. Findings raise the question of primary hepatic neoplasm with metastatic disease in the abdomen. Tumor marker correlation may be helpful. In the event the tumor markers are equivocal tissue sampling may be helpful. 4. Developing anasarca. Electronically Signed   By: Zetta Bills M.D.   On: 09/03/2022 17:49        Scheduled Meds:  folic acid  1 mg Oral Daily   gelatin adsorbable  1 each Topical Once   heparin  5,000 Units Subcutaneous Q8H   lactulose  20 g Oral BID  lidocaine (PF)  5 mL Intradermal Once   pantoprazole  40 mg Oral BID   propranolol  10 mg Oral BID   rifaximin  550 mg Oral BID   sodium chloride flush  3 mL Intravenous Q12H   thiamine  100 mg Oral Daily   Continuous Infusions:  magnesium sulfate bolus IVPB            Aline August, MD [facility] 09/05/2022, 10:45 AM

## 2022-09-05 NOTE — TOC Initial Note (Addendum)
Transition of Care (TOC) - Initial/Assessment Note   Spoke to patient at bedside. PAtient would like to have HHPT at discharge. No preference . Messaged Cory with [facility] awaiting response. Tommi Rumps accepted referral for HHPT   Patient lives alone has an aide 7 days a week for 3 hours a day.   Patient has wheelchair and a cane and transportation to appointments.   Patient's cousin Candyce Churn [phone] called requesting patient be transferred to [place] that for patient to be transferred he would need services that current hospital cannot provide. Neoma Laming would like to spoke to MD. NCM went to discuss with patient. Patient does not want to be transferred to [facility] he wants to go home. Called Deborah from patient's room , patient left Neoma Laming a voicemail stating that he does not want to be transferred to another hospital , and does not want MD to discuss with [facility]  Patient Details  Name: RISHI VICARIO MRN: [id number] Date of Birth: [birth year]  Transition of Care [facility]) CM/SW Contact:    Marilu Favre, RN Phone Number: 09/05/2022, 1:15 PM  Clinical Narrative:                   Expected Discharge Plan: [place] Barriers to Discharge: Continued Medical Work up   Patient Goals and CMS Choice Patient states their goals for this hospitalization and ongoing recovery are:: to return to home CMS Medicare.gov Compare Post Acute Care list provided to:: Patient Choice offered to / list presented to : Patient  Expected Discharge Plan and Services Expected Discharge Plan: [place]   Discharge Planning Services: CM Consult Post Acute Care Choice: [place] arrangements for the past 2 months: Apartment                 DME Arranged: N/A         HH Arranged: PT          Prior Living Arrangements/Services Living arrangements for the past 2 months: Apartment Lives with:: Self Patient language  and need for interpreter reviewed:: Yes Do you feel safe going back to the place where you live?: Yes      Need for Family Participation in Patient Care: Yes (Comment) Care giver support system in place?: Yes (comment) Current home services: DME Criminal Activity/Legal Involvement Pertinent to Current Situation/Hospitalization: No - Comment as needed  Activities of Daily Living      Permission Sought/Granted   Permission granted to share information with : No              Emotional Assessment Appearance:: Appears stated age Attitude/Demeanor/Rapport: Engaged Affect (typically observed): Accepting Orientation: : Oriented to Self, Oriented to Place, Oriented to  Time, Oriented to Situation Alcohol / Substance Use: Not Applicable Psych Involvement: No (comment)  Admission diagnosis:  General weakness [R53.1] AKI (acute kidney injury) ([place]) [N17.9] Acute renal failure, unspecified acute renal failure type ([place]) [N17.9] Patient Active Problem List   Diagnosis Date Noted   AKI (acute kidney injury) ([place]) 09/01/2022   Liver mass 09/01/2022   Uremia 09/01/2022   High anion gap metabolic acidosis 71/69/6789   Leukocytosis 09/01/2022   DNR (do not resuscitate)    Palliative care encounter    Physical deconditioning 38/06/1750   Metabolic encephalopathy    ARF (acute renal failure) ([place]) 07/27/2020   Hyperkalemia 04/02/2020   Hallucinations 04/02/2020   Anemia of chronic disease 04/02/2020  Chronic hepatitis C without hepatic coma ([place]) 08/09/2019   Ascites due to alcoholic cirrhosis (HCC)    SOB (shortness of breath)    Decompensated hepatic cirrhosis ([place]) 07/15/2019   Essential hypertension 06/23/2016   Alcohol abuse 06/23/2016   Chronic left hip pain 06/23/2016   Hyponatremia 06/23/2016   Hyperlipidemia    Blind right eye    PCP:  Jenny Reichmann, PA-C Pharmacy:   [facility] [place], [address] [place] [address] Phone: [phone] Fax: [phone]     Social Determinants of Health ([place]) Interventions    Readmission Risk Interventions    08/09/2020    1:56 PM  Readmission Risk Prevention Plan  Transportation Screening Complete  Medication Review (RN Care Manager) Complete  PCP or Specialist appointment within 3-5 days of discharge Complete  HRI or [place] Complete  SW Recovery Care/Counseling Consult Complete  Palliative Care Screening Complete  [place] Not Applicable

## 2022-09-05 NOTE — Progress Notes (Signed)
Daily Progress Note   Patient Name: Calvin [name]       Date: 09/05/2022 DOB: 26-Dec-1960  Age: 61 y.o. MRN#: [id number] Attending Physician: Calvin August, MD Primary Care Physician: Calvin [name] Admit Date: 09/01/2022  Reason for Consultation/Follow-up: Establishing goals of care  Patient Profile/HPI: 61 y.o. male  with past medical history of ETOH cirrhosis, chronic hep C, CVA, TIAs, right eye blindness admitted on 09/01/2022 with complaints of weakness and falls.  Workup revealed acute kidney injury with creatinine elevated to 5.4 as well as BUN up at 90 CT of the abdomen showed large liver mass CT of the head and MRI were negative MRI of the abdomen showed diffuse hepatic tumor.  He has undergone ultrasound-guided biopsy of the tumor.  His AFP was also elevated.  Palliative medicine consulted for above.    Subjective: Chart reviewed including labs, progress notes, imaging from this and previous encounters.  Awaiting pathology.  Oncology consulted and plan for outpatient follow-up and initiation of therapy.  Veer complains of pain in his hip and his back. Hip pain is chronic, but back may be referred pain from liver mass. Oxycodone works temporarily but wears off before next dose is due.  He has contact information for Calvin [name]- his Goddaughter. He would want Calvin [name] to be his decision maker if he was unable to make decisions for himself. He has not completed a formal HCPOA document, but he agrees this is needed due to his multiple family members and his desire to not have some of them involved in his care.   Review of Systems  Constitutional:  Positive for weight loss.  Gastrointestinal:  Positive for abdominal pain.  Musculoskeletal:  Positive for back pain and  joint pain.     Physical Exam Cardiovascular:     Rate and Rhythm: Normal rate.  Abdominal:     General: There is distension.  Skin:    Capillary Refill: Capillary refill takes less than 2 seconds.  Neurological:     Mental Status: He is alert and oriented to person, place, and time.     Comments: Dysarthric speech  Psychiatric:        Thought Content: Thought content normal.        Judgment: Judgment normal.  Vital Signs: BP (!) 118/92 (BP Location: Right Arm)   Pulse 93   Temp 99.3 F (37.4 C) (Oral)   Resp 17   SpO2 100%  SpO2: SpO2: 100 % O2 Device: O2 Device: Room Air O2 Flow Rate: O2 Flow Rate (L/min): 2 L/min  Intake/output summary: No intake or output data in the 24 hours ending 09/05/22 1453 LBM: Last BM Date : 09/03/22 Baseline Weight:   Most recent weight:         Palliative Assessment/Data: PPS: 60%      Patient Active Problem List   Diagnosis Date Noted   AKI (acute kidney injury) ([place]) 09/01/2022   Liver mass 09/01/2022   Uremia 09/01/2022   High anion gap metabolic acidosis 44/81/8563   Leukocytosis 09/01/2022   DNR (do not resuscitate)    Palliative care encounter    Physical deconditioning 14/97/0263   Metabolic encephalopathy    ARF (acute renal failure) ([place]) 07/27/2020   Hyperkalemia 04/02/2020   Hallucinations 04/02/2020   Anemia of chronic disease 04/02/2020   Chronic hepatitis C without hepatic coma ([place]) 08/09/2019   Ascites due to alcoholic cirrhosis (HCC)    SOB (shortness of breath)    Decompensated hepatic cirrhosis ([place]) 07/15/2019   Essential hypertension 06/23/2016   Alcohol abuse 06/23/2016   Chronic left hip pain 06/23/2016   Hyponatremia 06/23/2016   Hyperlipidemia    Blind right eye     Palliative Care Assessment & Plan    Assessment/Recommendations/Plan  Continue current plan of care Increase oxycodone '10mg'$  to q3hr prn I called Calvin [name] and left message Chaplain consult to create  HCPOA Outpatient referral for Palliative f/u at home   Code Status: DNR  Prognosis:  Unable to determine  Discharge Planning: Home with Palliative Services  Care plan was discussed with patient.   Thank you for allowing the Palliative Medicine Team to assist in the care of this patient.  Total time: 60 minutes Greater than 50%  of this time was spent counseling and coordinating care related to the above assessment and plan.  Calvin [name], AGNP-C Palliative Medicine   Please contact Palliative Medicine Team phone at [phone] for questions and concerns.

## 2022-09-05 NOTE — Progress Notes (Signed)
[facility] Progress Note  Calvin [name] 61 y.o. 07/21/1961  CC: Liver mass, hepatic encephalopathy, cirrhosis   Subjective: Patient seen and examined at bedside.  His brother remains at bedside.  Underwent liver biopsy yesterday.  More alert today.  Wants to go home.  ROS : Afebrile, negative for chest pain   Objective: Vital signs in last 24 hours: Vitals:   09/05/22 0553 09/05/22 0852  BP: (!) 133/97 102/65  Pulse: 80 94  Resp: 18 16  Temp: 98 F (36.7 C) 98.2 F (36.8 C)  SpO2: 100% 100%    Physical Exam: He is more alert today compared to yesterday.  His abdomen is moderately distended with some fullness but otherwise nontender, bowel sounds present.  Mood and affect normal.  Lab Results: Recent Labs    09/04/22 0252 09/05/22 0258  NA 142 138  K 3.7 3.6  CL 111 108  CO2 20* 18*  GLUCOSE 101* 97  BUN 30* 27*  CREATININE 1.01 1.06  CALCIUM 10.0 10.0  MG 1.3* 1.5*   Recent Labs    09/04/22 0252 09/05/22 0258  AST 36 44*  ALT 31 32  ALKPHOS 139* 155*  BILITOT 2.6* 2.1*  PROT 7.1 6.9  ALBUMIN 3.4* 3.1*   Recent Labs    09/04/22 0252 09/05/22 0258  WBC 17.1* 16.6*  NEUTROABS 13.5* 11.8*  HGB 8.7* 8.8*  HCT 23.8* 24.1*  MCV 88.8 89.3  PLT 219 219   Recent Labs    09/04/22 0648  LABPROT 20.0*  INR 1.7*      Assessment/Plan: -Abnormal CT concerning for large hepatic mass with lymphadenopathy.  Unable to get contrasted imaging because of elevated kidney functions.  Concerning for hepatocellular carcinoma given underlying cirrhosis.  Had elevated AFP in October 2023 and outpatient CT with contrast was ordered but it was not done.  Patient was also no-show for repeat labs in the office.   -Cirrhosis from history of hepatitis C and alcohol use.  Complicated by encephalopathy and portal hypertension with esophageal varices.   -Acute kidney injury.  Kidney functions improved significantly    Recommendations ------------------------- -Significantly elevated AFP at 218 concerning for underlying hepatocellular carcinoma.  His AFP was 34 in the end of October 2023 and significant rising AFP within few weeks is concerning for aggressive cancer.  -Underwent IR guided liver biopsy yesterday.  -Discussed with hospitalist.  Given elevated white counts, we will get diagnostic paracentesis done to rule out SBP.  If analysis negative for SBP or if there is no enough fluid for paracentesis, then likely discharge tomorrow.   Otis Brace MD, [place] 09/05/2022, 10:45 AM  Contact #  [phone]

## 2022-09-05 NOTE — Procedures (Signed)
PROCEDURE SUMMARY:  Successful US guided paracentesis from right lateral abdomen.  Yielded 550 mL of serosanguinous fluid.  No immediate complications.  Patient tolerated well.  EBL = trace  Specimen was sent for labs.  Zivah Mayr S Taron Conrey PA-C 09/05/2022 4:09 PM

## 2022-09-05 NOTE — Care Management Important Message (Signed)
Important Message  Patient Details  Name: Calvin [name] MRN: [id number] Date of Birth: 03/26/1961   Medicare Important Message Given:  Yes     Hannah Beat 09/05/2022, 2:15 PM

## 2022-09-06 ENCOUNTER — Inpatient Hospital Stay (HOSPITAL_COMMUNITY): Payer: Medicare Other

## 2022-09-06 DIAGNOSIS — R160 Hepatomegaly, not elsewhere classified: Secondary | ICD-10-CM | POA: Diagnosis not present

## 2022-09-06 DIAGNOSIS — D638 Anemia in other chronic diseases classified elsewhere: Secondary | ICD-10-CM | POA: Diagnosis not present

## 2022-09-06 DIAGNOSIS — N179 Acute kidney failure, unspecified: Secondary | ICD-10-CM | POA: Diagnosis not present

## 2022-09-06 DIAGNOSIS — K652 Spontaneous bacterial peritonitis: Secondary | ICD-10-CM | POA: Insufficient documentation

## 2022-09-06 DIAGNOSIS — D72829 Elevated white blood cell count, unspecified: Secondary | ICD-10-CM | POA: Diagnosis not present

## 2022-09-06 LAB — CBC WITH DIFFERENTIAL/PLATELET
Abs Immature Granulocytes: 0.29 10*3/uL — ABNORMAL HIGH (ref 0.00–0.07)
Basophils Absolute: 0.1 10*3/uL (ref 0.0–0.1)
Basophils Relative: 1 %
Eosinophils Absolute: 0.1 10*3/uL (ref 0.0–0.5)
Eosinophils Relative: 1 %
HCT: 28.1 % — ABNORMAL LOW (ref 39.0–52.0)
Hemoglobin: 9.9 g/dL — ABNORMAL LOW (ref 13.0–17.0)
Immature Granulocytes: 1 %
Lymphocytes Relative: 12 %
Lymphs Abs: 2.6 10*3/uL (ref 0.7–4.0)
MCH: 31.9 pg (ref 26.0–34.0)
MCHC: 35.2 g/dL (ref 30.0–36.0)
MCV: 90.6 fL (ref 80.0–100.0)
Monocytes Absolute: 2.7 10*3/uL — ABNORMAL HIGH (ref 0.1–1.0)
Monocytes Relative: 12 %
Neutro Abs: 15.6 10*3/uL — ABNORMAL HIGH (ref 1.7–7.7)
Neutrophils Relative %: 73 %
Platelets: 224 10*3/uL (ref 150–400)
RBC: 3.1 MIL/uL — ABNORMAL LOW (ref 4.22–5.81)
RDW: 16.7 % — ABNORMAL HIGH (ref 11.5–15.5)
WBC: 21.5 10*3/uL — ABNORMAL HIGH (ref 4.0–10.5)
nRBC: 0.7 % — ABNORMAL HIGH (ref 0.0–0.2)

## 2022-09-06 LAB — COMPREHENSIVE METABOLIC PANEL
ALT: 45 U/L — ABNORMAL HIGH (ref 0–44)
AST: 55 U/L — ABNORMAL HIGH (ref 15–41)
Albumin: 3.2 g/dL — ABNORMAL LOW (ref 3.5–5.0)
Alkaline Phosphatase: 176 U/L — ABNORMAL HIGH (ref 38–126)
Anion gap: 12 (ref 5–15)
BUN: 19 mg/dL (ref 8–23)
CO2: 21 mmol/L — ABNORMAL LOW (ref 22–32)
Calcium: 10.6 mg/dL — ABNORMAL HIGH (ref 8.9–10.3)
Chloride: 105 mmol/L (ref 98–111)
Creatinine, Ser: 1.18 mg/dL (ref 0.61–1.24)
GFR, Estimated: >60 mL/min (ref >60)
Glucose, Bld: 96 mg/dL (ref 70–99)
Potassium: 3.9 mmol/L (ref 3.5–5.1)
Sodium: 138 mmol/L (ref 135–145)
Total Bilirubin: 2.5 mg/dL — ABNORMAL HIGH (ref 0.3–1.2)
Total Protein: 7.6 g/dL (ref 6.5–8.1)

## 2022-09-06 LAB — MAGNESIUM: Magnesium: 1.7 mg/dL (ref 1.7–2.4)

## 2022-09-06 MED ORDER — ALBUMIN HUMAN 25 % IV SOLN
100.0000 g | Freq: Once | INTRAVENOUS | Status: DC
  Filled 2022-09-06: qty 400

## 2022-09-06 MED ORDER — EMPTY CONTAINERS FLEXIBLE MISC
100.0000 g | Freq: Once | Status: AC
  Administered 2022-09-06: 100 g via INTRAVENOUS
  Filled 2022-09-06 (×2): qty 400

## 2022-09-06 NOTE — Plan of Care (Signed)
  Problem: Clinical Measurements: Goal: Diagnostic test results will improve Outcome: Progressing   Problem: Activity: Goal: Risk for activity intolerance will decrease Outcome: Progressing   Problem: Activity: Goal: Risk for activity intolerance will decrease Outcome: Progressing   Problem: Nutrition: Goal: Adequate nutrition will be maintained Outcome: Progressing

## 2022-09-06 NOTE — Progress Notes (Signed)
PROGRESS NOTE    Calvin [name]  HCW:[id number] DOB: 04-Jun-1961 DOA: 09/01/2022 PCP: Jenny Reichmann, PA-C   Brief Narrative:  61 y.o. male with medical history significant of hyperlipidemia, hypertension, anemia, alcohol use, TIA/stroke, cirrhosis, hepatitis C, right eye blindness presented with weakness and fall.  On presentation, creatinine was 5.4 from baseline of 1.4 with AST of 59, ALT 55, ALP 231, T. bili of 2 and WBC of 17.6.  Respiratory panel for flu and COVID-19 were negative.  Chest x-ray showed no acute abnormality.  CT head without contrast showed no acute abnormality.  CT renal stone study showed large mass at the right hepatic lobe near the IVC with reactive adenopathy suspicious for malignancy along with inguinal hernia and cirrhosis.  He was started on IV fluids.  GI was consulted.  MRI showed diffuse hepatic tumor.  He underwent a liver biopsy by IR on 09/04/2022.  Assessment & Plan:   AKI Uremia Acute metabolic acidosis -Creatinine 5.4 on presentation with baseline of 1.4.  Treated with IV fluids.  Creatinine has improved; 1.18 today.  Off IV fluids.  Repeat a.m. labs.  Still slightly acidotic.  Continue oral sodium bicarbonate tablets.  Liver mass Elevated LFTs Possible decompensated cirrhosis of liver with ascites from history of chronic hepatitis C and alcohol abuse Possible hepatoencephalopathy -GI following.  MRI of abdomen shows findings of diffuse hepatic tumor: Findings raise the question of primary hepatic neoplasm with metastatic disease in the abdomen with developing anasarca.  Status post liver biopsy by IR on 09/04/2022.  Follow pathology.  Oncology following.  Will need outpatient follow-up with oncology.  CT chest pending -AFP significantly elevated -CT/MRI of brain without contrast was negative for acute intracranial abnormality -Continue propranolol, rifaximin and lactulose.  Might have to resume Lasix at some point. -palliative care following and  recommend home with palliative services.  Leukocytosis Possible SBP -Status post IR paracentesis on 09/05/2022 and removal of 550 cc fluid.  Peritoneal fluid analysis consistent with possible SBP.  Start Rocephin. -WBCs worsening: Repeat a.m. labs  Anemia of chronic disease -Possibly from chronic illnesses.  Hemoglobin stable.  Monitor intermittently  Essential hypertension -Blood pressure improving.  Amlodipine and Lasix on hold.  Hypomagnesemia -Improved.  Physical deconditioning -PT recommends home health PT   DVT prophylaxis: Heparin subcutaneous Code Status: DNR Family Communication: None at bedside Disposition Plan: Status is: Inpatient Remains inpatient appropriate because: Of severity of illness  Consultants: GI/IR/oncology/palliative care  Procedures: Liver biopsy by IR on 09/04/2022  Antimicrobials: None   Subjective: Patient seen and examined at bedside.  Poor historian.  Denies worsening abdominal pain or vomiting.  No agitation, seizures or fever reported.  Objective: Vitals:   09/05/22 1421 09/05/22 2043 09/06/22 0307 09/06/22 0801  BP: (!) 118/92 (!) 143/86 124/84 128/86  Pulse: 93 99 92 95  Resp: '17 17 19 17  '$ Temp: 99.3 F (37.4 C) 98.3 F (36.8 C) 97.7 F (36.5 C) 98.2 F (36.8 C)  TempSrc: Oral Oral Oral Oral  SpO2: 100% 100% 100% 100%    Intake/Output Summary (Last 24 hours) at 09/06/2022 0819 Last data filed at 09/06/2022 0538 Gross per 24 hour  Intake 482.19 ml  Output --  Net 482.19 ml    There were no vitals filed for this visit.  Examination:  General: On room air currently.  No distress.  Looks chronically ill and deconditioned. ENT/neck: No obvious JVD elevation or palpable thyromegaly noted respiratory: Decreased breath sounds at bases with some crackles  CVS:  S1-S2 heard; rate controlled currently Abdominal: Soft, mild diffuse tenderness present, still showing some distention; no organomegaly, normal bowel sounds are heard   extremities: Trace lower extremity edema present; no cyanosis CNS: Alert and still slow to respond and speech is not that comprehensible.  Poor historian.  No focal neurologic deficit.  Moving extremities Lymph: No obvious lymphadenopathy noted  skin: No obvious lesions/ecchymosis  psych: Currently not agitated.  Affect is extremely flat. musculoskeletal: No obvious joint swelling/tenderness   Data Reviewed: I have personally reviewed following labs and imaging studies  CBC: Recent Labs  Lab 09/01/22 0945 09/02/22 0210 09/03/22 0308 09/04/22 0252 09/05/22 0258 09/06/22 0248  WBC 17.6* 15.9* 14.0* 17.1* 16.6* 21.5*  NEUTROABS 13.2*  --  10.3* 13.5* 11.8* 15.6*  HGB 11.0* 10.1* 8.8* 8.7* 8.8* 9.9*  HCT 31.5* 28.0* 23.6* 23.8* 24.1* 28.1*  MCV 92.1 87.8 87.1 88.8 89.3 90.6  PLT 473* 320 237 219 219 833    Basic Metabolic Panel: Recent Labs  Lab 09/01/22 0945 09/02/22 0210 09/03/22 0308 09/04/22 0252 09/05/22 0258 09/06/22 0248  NA 133* 135 138 142 138 138  K 4.2 3.4* 3.7 3.7 3.6 3.9  CL 99 102 109 111 108 105  CO2 14* 18* 19* 20* 18* 21*  GLUCOSE 144* 118* 98 101* 97 96  BUN 90* 76* 49* 30* 27* 19  CREATININE 5.40* 3.25* 1.42* 1.01 1.06 1.18  CALCIUM 9.4 9.3 9.5 10.0 10.0 10.6*  MG 2.7*  --   --  1.3* 1.5* 1.7    GFR: CrCl cannot be calculated (Unknown ideal weight.). Liver Function Tests: Recent Labs  Lab 09/02/22 0210 09/03/22 0308 09/04/22 0252 09/05/22 0258 09/06/22 0248  AST 47* 42* 36 44* 55*  ALT 48* 32 31 32 45*  ALKPHOS 208* 167* 139* 155* 176*  BILITOT 2.0* 2.2* 2.6* 2.1* 2.5*  PROT 7.7 6.8 7.1 6.9 7.6  ALBUMIN 2.6* 2.9* 3.4* 3.1* 3.2*    Recent Labs  Lab 09/01/22 0945  LIPASE 42    Recent Labs  Lab 09/01/22 2121 09/04/22 0252  AMMONIA 71* 46*    Coagulation Profile: Recent Labs  Lab 09/01/22 2121 09/02/22 0210 09/04/22 0648  INR 1.5* 1.6* 1.7*    Cardiac Enzymes: No results for input(s): "CKTOTAL", "CKMB", "CKMBINDEX",  "TROPONINI" in the last 168 hours. BNP (last 3 results) No results for input(s): "PROBNP" in the last 8760 hours. HbA1C: No results for input(s): "HGBA1C" in the last 72 hours. CBG: No results for input(s): "GLUCAP" in the last 168 hours. Lipid Profile: No results for input(s): "CHOL", "HDL", "LDLCALC", "TRIG", "CHOLHDL", "LDLDIRECT" in the last 72 hours. Thyroid Function Tests: No results for input(s): "TSH", "T4TOTAL", "FREET4", "T3FREE", "THYROIDAB" in the last 72 hours. Anemia Panel: No results for input(s): "VITAMINB12", "FOLATE", "FERRITIN", "TIBC", "IRON", "RETICCTPCT" in the last 72 hours. Sepsis Labs: No results for input(s): "PROCALCITON", "LATICACIDVEN" in the last 168 hours.  Recent Results (from the past 240 hour(s))  Resp panel by RT-PCR (RSV, Flu A&B, Covid) Anterior Nasal Swab     Status: None   Collection Time: 09/01/22  1:49 PM   Specimen: Anterior Nasal Swab  Result Value Ref Range Status   SARS Coronavirus 2 by RT PCR NEGATIVE NEGATIVE Final    Comment: (NOTE) SARS-CoV-2 target nucleic acids are NOT DETECTED.  The SARS-CoV-2 RNA is generally detectable in upper respiratory specimens during the acute phase of infection. The lowest concentration of SARS-CoV-2 viral copies this assay can detect is 138 copies/mL. A negative result does  not preclude SARS-Cov-2 infection and should not be used as the sole basis for treatment or other patient management decisions. A negative result may occur with  improper specimen collection/handling, submission of specimen other than nasopharyngeal swab, presence of viral mutation(s) within the areas targeted by this assay, and inadequate number of viral copies(<138 copies/mL). A negative result must be combined with clinical observations, patient history, and epidemiological information. The expected result is Negative.  Fact Sheet for Patients:  EntrepreneurPulse.com.au  Fact Sheet for Healthcare Providers:   IncredibleEmployment.be  This test is no t yet approved or cleared by the [place] FDA and  has been authorized for detection and/or diagnosis of SARS-CoV-2 by FDA under an Emergency Use Authorization (EUA). This EUA will remain  in effect (meaning this test can be used) for the duration of the COVID-19 declaration under Section 564(b)(1) of the Act, 21 U.S.C.section 360bbb-3(b)(1), unless the authorization is terminated  or revoked sooner.       Influenza A by PCR NEGATIVE NEGATIVE Final   Influenza B by PCR NEGATIVE NEGATIVE Final    Comment: (NOTE) The Xpert Xpress SARS-CoV-2/FLU/RSV plus assay is intended as an aid in the diagnosis of influenza from Nasopharyngeal swab specimens and should not be used as a sole basis for treatment. Nasal washings and aspirates are unacceptable for Xpert Xpress SARS-CoV-2/FLU/RSV testing.  Fact Sheet for Patients: EntrepreneurPulse.com.au  Fact Sheet for Healthcare Providers: IncredibleEmployment.be  This test is not yet approved or cleared by the [place] FDA and has been authorized for detection and/or diagnosis of SARS-CoV-2 by FDA under an Emergency Use Authorization (EUA). This EUA will remain in effect (meaning this test can be used) for the duration of the COVID-19 declaration under Section 564(b)(1) of the Act, 21 U.S.C. section 360bbb-3(b)(1), unless the authorization is terminated or revoked.     Resp Syncytial Virus by PCR NEGATIVE NEGATIVE Final    Comment: (NOTE) Fact Sheet for Patients: EntrepreneurPulse.com.au  Fact Sheet for Healthcare Providers: IncredibleEmployment.be  This test is not yet approved or cleared by the [place] FDA and has been authorized for detection and/or diagnosis of SARS-CoV-2 by FDA under an Emergency Use Authorization (EUA). This EUA will remain in effect (meaning this test can be used) for  the duration of the COVID-19 declaration under Section 564(b)(1) of the Act, 21 U.S.C. section 360bbb-3(b)(1), unless the authorization is terminated or revoked.  Performed at [facility], [place] [address] [place], [place] [address]   Gram stain     Status: None   Collection Time: 09/05/22  4:10 PM   Specimen: Fluid  Result Value Ref Range Status   Specimen Description FLUID PERITONEAL  Final   Special Requests NONE  Final   Gram Stain   Final    WBC PRESENT, PREDOMINANTLY PMN NO ORGANISMS SEEN CYTOSPIN SMEAR Performed at [facility], [place] [address] [place],  [address]    Report Status 09/05/2022 FINAL  Final         Radiology Studies: IR Paracentesis  Result Date: 09/06/2022 INDICATION: Presumed hepatocellular carcinoma with ascites. Request for diagnostic and therapeutic paracentesis. EXAM: ULTRASOUND GUIDED PARACENTESIS MEDICATIONS: 1% lidocaine 10 mL COMPLICATIONS: None immediate. PROCEDURE: Informed written consent was obtained from the patient after a discussion of the risks, benefits and alternatives to treatment. A timeout was performed prior to the initiation of the procedure. Initial ultrasound scanning demonstrates a small amount of ascites within the right lateral abdomen. The right lateral abdomen was prepped and  draped in the usual sterile fashion. 1% lidocaine was used for local anesthesia. Following this, a 19 gauge, 7-cm, Yueh catheter was introduced. An ultrasound image was saved for documentation purposes. The paracentesis was performed. The catheter was removed and a dressing was applied. The patient tolerated the procedure well without immediate post procedural complication. FINDINGS: A total of approximately 550 mL of serosanguineous fluid was removed. Samples were sent to the laboratory as requested by the clinical team. IMPRESSION: Successful ultrasound-guided paracentesis yielding 550 mL of peritoneal fluid. Procedure performed by: Gareth Eagle, PA-C Electronically Signed   By: Sandi Mariscal M.D.   On: 09/06/2022 06:24   US BIOPSY (LIVER)  Result Date: 09/04/2022 INDICATION: Multiple liver lesions EXAM: Ultrasound-guided liver mass biopsy MEDICATIONS: None. ANESTHESIA/SEDATION: Moderate (conscious) sedation was employed during this procedure. A total of Versed 0.5 mg and Fentanyl 50 mcg was administered intravenously by the radiology nurse. Total intra-service moderate Sedation Time: 13 minutes. The patient's level of consciousness and vital signs were monitored continuously by radiology nursing throughout the procedure under my direct supervision. COMPLICATIONS: None immediate. PROCEDURE: Informed written consent was obtained from the patient after a thorough discussion of the procedural risks, benefits and alternatives. All questions were addressed. Maximal Sterile Barrier Technique was utilized including caps, mask, sterile gowns, sterile gloves, sterile drape, hand hygiene and skin antiseptic. A timeout was performed prior to the initiation of the procedure. Patient position supine on the ultrasound table. Epigastric skin prepped and draped in usual sterile fashion. Following local lidocaine administration, 17 gauge introducer needle was advanced into 1 of the left liver masses, and 4- 18 gauge cores were obtained utilizing continuous ultrasound guidance. Gelfoam slurry was administered through the introducer needle at the biopsy site. Samples were sent to pathology in formalin. Needle removed and hemostasis achieved with 5 minutes of manual compression. Post procedure ultrasound images showed no evidence of significant hemorrhage. IMPRESSION: Ultrasound-guided biopsy of left liver mass. Electronically Signed   By: Miachel Roux M.D.   On: 09/04/2022 13:52        Scheduled Meds:  folic acid  1 mg Oral Daily   gelatin adsorbable  1 each Topical Once   heparin  5,000 Units Subcutaneous Q8H   lactulose  20 g Oral BID   lidocaine (PF)  5  mL Intradermal Once   pantoprazole  40 mg Oral BID   propranolol  10 mg Oral BID   rifaximin  550 mg Oral BID   sodium bicarbonate  650 mg Oral TID   sodium chloride flush  3 mL Intravenous Q12H   thiamine  100 mg Oral Daily   Continuous Infusions:  Albumin human 25 % 100 g     albumin human     cefTRIAXone (ROCEPHIN)  IV Stopped (09/05/22 2220)          Aline August, MD [facility] 09/06/2022, 8:19 AM

## 2022-09-06 NOTE — Progress Notes (Signed)
Mobility Specialist Progress Note    09/06/22 1057  Mobility  Activity Ambulated with assistance in hallway  Level of Assistance Standby assist, set-up cues, supervision of patient - no hands on  Assistive Device Cane  Distance Ambulated (ft) 500 ft  Activity Response Tolerated well  Mobility Referral Yes  $Mobility charge 1 Mobility   Pt received sitting EOB and agreeable. No complaints on walk. Returned to bed with call bell in reach.    Hildred Alamin Mobility Specialist  Please Psychologist, sport and exercise or Rehab Office at [phone]

## 2022-09-06 NOTE — Progress Notes (Signed)
[facility] Progress Note  Calvin [name] 61 y.o. [birth year]  CC: Liver mass, hepatic encephalopathy, cirrhosis   Subjective: Patient seen and examined at bedside.  He is somewhat anxious and wants to go home.  Diagnosis of SBP discussed with the patient in need for ongoing inpatient management also discussed.  He is hoping to go home tomorrow.  ROS : Afebrile, negative for chest pain   Objective: Vital signs in last 24 hours: Vitals:   09/06/22 0307 09/06/22 0801  BP: 124/84 128/86  Pulse: 92 95  Resp: 19 17  Temp: 97.7 F (36.5 C) 98.2 F (36.8 C)  SpO2: 100% 100%    Physical Exam: He is more alert today compared to yesterday.  His abdomen is mildly  distended with some fullness but otherwise nontender, bowel sounds present.  Mood and affect normal.  Lab Results: Recent Labs    09/05/22 0258 09/06/22 0248  NA 138 138  K 3.6 3.9  CL 108 105  CO2 18* 21*  GLUCOSE 97 96  BUN 27* 19  CREATININE 1.06 1.18  CALCIUM 10.0 10.6*  MG 1.5* 1.7   Recent Labs    09/05/22 0258 09/06/22 0248  AST 44* 55*  ALT 32 45*  ALKPHOS 155* 176*  BILITOT 2.1* 2.5*  PROT 6.9 7.6  ALBUMIN 3.1* 3.2*   Recent Labs    09/05/22 0258 09/06/22 0248  WBC 16.6* 21.5*  NEUTROABS 11.8* 15.6*  HGB 8.8* 9.9*  HCT 24.1* 28.1*  MCV 89.3 90.6  PLT 219 224   Recent Labs    09/04/22 0648  LABPROT 20.0*  INR 1.7*      Assessment/Plan: -Hepatocellular carcinoma.  Liver biopsy September 04, 2022 showed moderate to poorly differentiated hepatocellular carcinoma.  Likely metastatic tumor.  Seen by oncology.   -Cirrhosis from history of hepatitis C and alcohol use.  Complicated by encephalopathy and portal hypertension with esophageal varices.   -Acute kidney injury.  Kidney functions improved significantly  -SBP.  Underwent paracentesis yesterday for persistent leukocytosis which came back positive for SBP.   Recommendations ------------------------- -Started on IV  Rocephin and albumin for SBP. -Recommend inpatient admission for another 24 to 48 hours -Appreciate oncology input.  They are planning to see him back as an outpatient to discuss further treatment options. -Overall with possible metastatic hepatocellular carcinoma and SBP, prognosis is  poor. -GI will follow.  Discussed with hospital team.  Otis Brace MD, [place] 09/06/2022, 12:28 PM  Contact #  [phone]

## 2022-09-07 DIAGNOSIS — I10 Essential (primary) hypertension: Secondary | ICD-10-CM | POA: Diagnosis not present

## 2022-09-07 DIAGNOSIS — K652 Spontaneous bacterial peritonitis: Secondary | ICD-10-CM | POA: Diagnosis not present

## 2022-09-07 DIAGNOSIS — N179 Acute kidney failure, unspecified: Secondary | ICD-10-CM | POA: Diagnosis not present

## 2022-09-07 DIAGNOSIS — R160 Hepatomegaly, not elsewhere classified: Secondary | ICD-10-CM | POA: Diagnosis not present

## 2022-09-07 LAB — COMPREHENSIVE METABOLIC PANEL
ALT: 36 U/L (ref 0–44)
AST: 39 U/L (ref 15–41)
Albumin: 3.7 g/dL (ref 3.5–5.0)
Alkaline Phosphatase: 165 U/L — ABNORMAL HIGH (ref 38–126)
Anion gap: 10 (ref 5–15)
BUN: 16 mg/dL (ref 8–23)
CO2: 22 mmol/L (ref 22–32)
Calcium: 10.7 mg/dL — ABNORMAL HIGH (ref 8.9–10.3)
Chloride: 106 mmol/L (ref 98–111)
Creatinine, Ser: 1.02 mg/dL (ref 0.61–1.24)
GFR, Estimated: >60 mL/min (ref >60)
Glucose, Bld: 94 mg/dL (ref 70–99)
Potassium: 4.1 mmol/L (ref 3.5–5.1)
Sodium: 138 mmol/L (ref 135–145)
Total Bilirubin: 3.1 mg/dL — ABNORMAL HIGH (ref 0.3–1.2)
Total Protein: 7.5 g/dL (ref 6.5–8.1)

## 2022-09-07 LAB — CBC WITH DIFFERENTIAL/PLATELET
Abs Immature Granulocytes: 0.22 10*3/uL — ABNORMAL HIGH (ref 0.00–0.07)
Basophils Absolute: 0.1 10*3/uL (ref 0.0–0.1)
Basophils Relative: 1 %
Eosinophils Absolute: 0.2 10*3/uL (ref 0.0–0.5)
Eosinophils Relative: 1 %
HCT: 25.4 % — ABNORMAL LOW (ref 39.0–52.0)
Hemoglobin: 8.9 g/dL — ABNORMAL LOW (ref 13.0–17.0)
Immature Granulocytes: 1 %
Lymphocytes Relative: 12 %
Lymphs Abs: 2.5 10*3/uL (ref 0.7–4.0)
MCH: 31.3 pg (ref 26.0–34.0)
MCHC: 35 g/dL (ref 30.0–36.0)
MCV: 89.4 fL (ref 80.0–100.0)
Monocytes Absolute: 2.5 10*3/uL — ABNORMAL HIGH (ref 0.1–1.0)
Monocytes Relative: 12 %
Neutro Abs: 15.3 10*3/uL — ABNORMAL HIGH (ref 1.7–7.7)
Neutrophils Relative %: 73 %
Platelets: 192 10*3/uL (ref 150–400)
RBC: 2.84 MIL/uL — ABNORMAL LOW (ref 4.22–5.81)
RDW: 17.2 % — ABNORMAL HIGH (ref 11.5–15.5)
WBC: 20.9 10*3/uL — ABNORMAL HIGH (ref 4.0–10.5)
nRBC: 0.2 % (ref 0.0–0.2)

## 2022-09-07 LAB — MAGNESIUM: Magnesium: 1.6 mg/dL — ABNORMAL LOW (ref 1.7–2.4)

## 2022-09-07 MED ORDER — EMPTY CONTAINERS FLEXIBLE MISC
100.0000 g | Freq: Once | Status: AC
  Administered 2022-09-07: 100 g via INTRAVENOUS
  Filled 2022-09-07: qty 400

## 2022-09-07 MED ORDER — MAGNESIUM SULFATE 2 GM/50ML IV SOLN
2.0000 g | Freq: Once | INTRAVENOUS | Status: AC
  Administered 2022-09-07: 2 g via INTRAVENOUS
  Filled 2022-09-07: qty 50

## 2022-09-07 MED ORDER — FUROSEMIDE 20 MG PO TABS
20.0000 mg | ORAL_TABLET | Freq: Every day | ORAL | Status: DC
  Administered 2022-09-07 – 2022-09-08 (×2): 20 mg via ORAL
  Filled 2022-09-07 (×2): qty 1

## 2022-09-07 MED ORDER — SPIRONOLACTONE 25 MG PO TABS
25.0000 mg | ORAL_TABLET | Freq: Every day | ORAL | Status: DC
  Administered 2022-09-07 – 2022-09-08 (×2): 25 mg via ORAL
  Filled 2022-09-07 (×2): qty 1

## 2022-09-07 NOTE — Progress Notes (Signed)
PROGRESS NOTE    Calvin [name]  YWV:[id number] DOB: 1961/01/16 DOA: 09/01/2022 PCP: Jenny Reichmann, PA-C   Brief Narrative:  61 y.o. male with medical history significant of hyperlipidemia, hypertension, anemia, alcohol use, TIA/stroke, cirrhosis, hepatitis C, right eye blindness presented with weakness and fall.  On presentation, creatinine was 5.4 from baseline of 1.4 with AST of 59, ALT 55, ALP 231, T. bili of 2 and WBC of 17.6.  Respiratory panel for flu and COVID-19 were negative.  Chest x-ray showed no acute abnormality.  CT head without contrast showed no acute abnormality.  CT renal stone study showed large mass at the right hepatic lobe near the IVC with reactive adenopathy suspicious for malignancy along with inguinal hernia and cirrhosis.  He was started on IV fluids.  GI was consulted.  MRI showed diffuse hepatic tumor.  He underwent a liver biopsy by IR on 09/04/2022.  Subsequently, oncology was consulted.  He underwent IR paracentesis on 09/05/2022 and subsequently started on Rocephin for possible SBP.  Assessment & Plan:   AKI Uremia Acute metabolic acidosis -Creatinine 5.4 on presentation with baseline of 1.4.  Treated with IV fluids.  Creatinine has improved; 1.102 today.  Off IV fluids.  Repeat a.m. labs.  Acidosis improved.  Currently on oral sodium bicarbonate tablets.  Repeat a.m. labs.  Liver mass Pulmonary nodules with mediastinal lymphadenopathy Elevated LFTs Possible decompensated cirrhosis of liver with ascites from history of chronic hepatitis C and alcohol abuse Possible hepatoencephalopathy -GI following.  MRI of abdomen shows findings of diffuse hepatic tumor: Findings raise the question of primary hepatic neoplasm with metastatic disease in the abdomen with developing anasarca.  Status post liver biopsy by IR on 09/04/2022.  Follow pathology.  Oncology following.  Will need outpatient follow-up with oncology.  CT chest showed pulmonary nodules with  residual lymphadenopathy most likely metastatic. -AFP significantly elevated -CT/MRI of brain without contrast was negative for acute intracranial abnormality -Continue propranolol, rifaximin and lactulose.  Might have to resume Lasix at some point. -palliative care following and recommend home with palliative services.  Leukocytosis Possible SBP -Status post IR paracentesis on 09/05/2022 and removal of 550 cc fluid.  Peritoneal fluid analysis consistent with possible SBP.  Continue Rocephin. -WBCs still elevated.  Repeat a.m. labs.  Anemia of chronic disease -Possibly from chronic illnesses.  Hemoglobin stable.  Monitor intermittently  Essential hypertension -Blood pressure improving.  Amlodipine and Lasix on hold.  Hypomagnesemia -Replace.  Physical deconditioning -PT recommends home health PT   DVT prophylaxis: Heparin subcutaneous Code Status: DNR Family Communication: None at bedside Disposition Plan: Status is: Inpatient Remains inpatient appropriate because: Of severity of illness.  Possible discharge in 1 to 3 days if condition improves.  Consultants: GI/IR/oncology/palliative care  Procedures: Liver biopsy by IR on 09/04/2022.  IR paracentesis on 09/05/2022  Antimicrobials: Rocephin from 09/05/2022 onwards   Subjective: Patient seen and examined at bedside.  Poor historian.  Wants to go home.  No agitation, fever or vomiting reported.   Objective: Vitals:   09/06/22 2124 09/07/22 0500 09/07/22 0522 09/07/22 0749  BP: 133/76  (!) 144/87 134/80  Pulse: 94  89 94  Resp: '16  18 17  '$ Temp: 98.3 F (36.8 C)  98.4 F (36.9 C) 98.2 F (36.8 C)  TempSrc: Oral  Oral Oral  SpO2: 100%  100% 100%  Weight:  74.5 kg      Intake/Output Summary (Last 24 hours) at 09/07/2022 0828 Last data filed at 09/07/2022 0420 Gross per 24  hour  Intake 400 ml  Output --  Net 400 ml    Filed Weights   09/07/22 0500  Weight: 74.5 kg    Examination:  General: No acute  distress currently.  On room air.  Looks chronically ill and deconditioned. ENT/neck: No palpable neck masses or JVD elevation noted respiratory: Bilateral decreased breath sounds at bases with scattered crackles CVS: Rate controlled; S1 and S2 are heard  abdominal: Soft, diffuse mild tenderness present; distended mildly no organomegaly, bowel sounds heard  extremities: No clubbing; mild lower extremity edema present CNS: Awake; still slow to respond and speech is not comprehensible.  Poor historian.  No focal neurologic deficit.  Able to move extremities  lymph: No cervical lymphadenopathy noted  skin: No obvious petechiae/rashes  psych: Flat affect.  Showing no signs of agitation currently musculoskeletal: No obvious joint erythema/swelling  Data Reviewed: I have personally reviewed following labs and imaging studies  CBC: Recent Labs  Lab 09/03/22 0308 09/04/22 0252 09/05/22 0258 09/06/22 0248 09/07/22 0212  WBC 14.0* 17.1* 16.6* 21.5* 20.9*  NEUTROABS 10.3* 13.5* 11.8* 15.6* 15.3*  HGB 8.8* 8.7* 8.8* 9.9* 8.9*  HCT 23.6* 23.8* 24.1* 28.1* 25.4*  MCV 87.1 88.8 89.3 90.6 89.4  PLT 237 219 219 224 161    Basic Metabolic Panel: Recent Labs  Lab 09/01/22 0945 09/02/22 0210 09/03/22 0308 09/04/22 0252 09/05/22 0258 09/06/22 0248 09/07/22 0212  NA 133*   < > 138 142 138 138 138  K 4.2   < > 3.7 3.7 3.6 3.9 4.1  CL 99   < > 109 111 108 105 106  CO2 14*   < > 19* 20* 18* 21* 22  GLUCOSE 144*   < > 98 101* 97 96 94  BUN 90*   < > 49* 30* 27* 19 16  CREATININE 5.40*   < > 1.42* 1.01 1.06 1.18 1.02  CALCIUM 9.4   < > 9.5 10.0 10.0 10.6* 10.7*  MG 2.7*  --   --  1.3* 1.5* 1.7 1.6*   < > = values in this interval not displayed.    GFR: Estimated Creatinine Clearance: 70.2 mL/min (by C-G formula based on SCr of 1.02 mg/dL). Liver Function Tests: Recent Labs  Lab 09/03/22 0308 09/04/22 0252 09/05/22 0258 09/06/22 0248 09/07/22 0212  AST 42* 36 44* 55* 39  ALT 32 31  32 45* 36  ALKPHOS 167* 139* 155* 176* 165*  BILITOT 2.2* 2.6* 2.1* 2.5* 3.1*  PROT 6.8 7.1 6.9 7.6 7.5  ALBUMIN 2.9* 3.4* 3.1* 3.2* 3.7    Recent Labs  Lab 09/01/22 0945  LIPASE 42    Recent Labs  Lab 09/01/22 2121 09/04/22 0252  AMMONIA 71* 46*    Coagulation Profile: Recent Labs  Lab 09/01/22 2121 09/02/22 0210 09/04/22 0648  INR 1.5* 1.6* 1.7*    Cardiac Enzymes: No results for input(s): "CKTOTAL", "CKMB", "CKMBINDEX", "TROPONINI" in the last 168 hours. BNP (last 3 results) No results for input(s): "PROBNP" in the last 8760 hours. HbA1C: No results for input(s): "HGBA1C" in the last 72 hours. CBG: No results for input(s): "GLUCAP" in the last 168 hours. Lipid Profile: No results for input(s): "CHOL", "HDL", "LDLCALC", "TRIG", "CHOLHDL", "LDLDIRECT" in the last 72 hours. Thyroid Function Tests: No results for input(s): "TSH", "T4TOTAL", "FREET4", "T3FREE", "THYROIDAB" in the last 72 hours. Anemia Panel: No results for input(s): "VITAMINB12", "FOLATE", "FERRITIN", "TIBC", "IRON", "RETICCTPCT" in the last 72 hours. Sepsis Labs: No results for input(s): "PROCALCITON", "LATICACIDVEN" in  the last 168 hours.  Recent Results (from the past 240 hour(s))  Resp panel by RT-PCR (RSV, Flu A&B, Covid) Anterior Nasal Swab     Status: None   Collection Time: 09/01/22  1:49 PM   Specimen: Anterior Nasal Swab  Result Value Ref Range Status   SARS Coronavirus 2 by RT PCR NEGATIVE NEGATIVE Final    Comment: (NOTE) SARS-CoV-2 target nucleic acids are NOT DETECTED.  The SARS-CoV-2 RNA is generally detectable in upper respiratory specimens during the acute phase of infection. The lowest concentration of SARS-CoV-2 viral copies this assay can detect is 138 copies/mL. A negative result does not preclude SARS-Cov-2 infection and should not be used as the sole basis for treatment or other patient management decisions. A negative result may occur with  improper specimen  collection/handling, submission of specimen other than nasopharyngeal swab, presence of viral mutation(s) within the areas targeted by this assay, and inadequate number of viral copies(<138 copies/mL). A negative result must be combined with clinical observations, patient history, and epidemiological information. The expected result is Negative.  Fact Sheet for Patients:  EntrepreneurPulse.com.au  Fact Sheet for Healthcare Providers:  IncredibleEmployment.be  This test is no t yet approved or cleared by the [place] FDA and  has been authorized for detection and/or diagnosis of SARS-CoV-2 by FDA under an Emergency Use Authorization (EUA). This EUA will remain  in effect (meaning this test can be used) for the duration of the COVID-19 declaration under Section 564(b)(1) of the Act, 21 U.S.C.section 360bbb-3(b)(1), unless the authorization is terminated  or revoked sooner.       Influenza A by PCR NEGATIVE NEGATIVE Final   Influenza B by PCR NEGATIVE NEGATIVE Final    Comment: (NOTE) The Xpert Xpress SARS-CoV-2/FLU/RSV plus assay is intended as an aid in the diagnosis of influenza from Nasopharyngeal swab specimens and should not be used as a sole basis for treatment. Nasal washings and aspirates are unacceptable for Xpert Xpress SARS-CoV-2/FLU/RSV testing.  Fact Sheet for Patients: EntrepreneurPulse.com.au  Fact Sheet for Healthcare Providers: IncredibleEmployment.be  This test is not yet approved or cleared by the [place] FDA and has been authorized for detection and/or diagnosis of SARS-CoV-2 by FDA under an Emergency Use Authorization (EUA). This EUA will remain in effect (meaning this test can be used) for the duration of the COVID-19 declaration under Section 564(b)(1) of the Act, 21 U.S.C. section 360bbb-3(b)(1), unless the authorization is terminated or revoked.     Resp Syncytial  Virus by PCR NEGATIVE NEGATIVE Final    Comment: (NOTE) Fact Sheet for Patients: EntrepreneurPulse.com.au  Fact Sheet for Healthcare Providers: IncredibleEmployment.be  This test is not yet approved or cleared by the [place] FDA and has been authorized for detection and/or diagnosis of SARS-CoV-2 by FDA under an Emergency Use Authorization (EUA). This EUA will remain in effect (meaning this test can be used) for the duration of the COVID-19 declaration under Section 564(b)(1) of the Act, 21 U.S.C. section 360bbb-3(b)(1), unless the authorization is terminated or revoked.  Performed at [facility], [place] [address] [place], [place] [address]   Culture, body fluid w Gram Stain-bottle     Status: None (Preliminary result)   Collection Time: 09/05/22  4:10 PM   Specimen: Fluid  Result Value Ref Range Status   Specimen Description FLUID PERITONEAL  Final   Special Requests BOTTLES DRAWN AEROBIC AND ANAEROBIC  Final   Culture   Final    NO GROWTH 2 DAYS Performed at  [facility], [place] [address], [place], [place] [address]    Report Status PENDING  Incomplete  Gram stain     Status: None   Collection Time: 09/05/22  4:10 PM   Specimen: Fluid  Result Value Ref Range Status   Specimen Description FLUID PERITONEAL  Final   Special Requests NONE  Final   Gram Stain   Final    WBC PRESENT, PREDOMINANTLY PMN NO ORGANISMS SEEN CYTOSPIN SMEAR Performed at [facility], [place] [address], [place], [place] [address]    Report Status 09/05/2022 FINAL  Final         Radiology Studies: CT CHEST WO CONTRAST  Result Date: 09/06/2022 CLINICAL DATA:  61 year old male with history of hepatocellular carcinoma. Staging examination. * Tracking Code: BO * EXAM: CT CHEST WITHOUT CONTRAST TECHNIQUE: Multidetector CT imaging of the chest was performed following the standard protocol without IV contrast. RADIATION DOSE REDUCTION: This exam  was performed according to the departmental dose-optimization program which includes automated exposure control, adjustment of the mA and/or kV according to patient size and/or use of iterative reconstruction technique. COMPARISON:  Chest CTA 02/15/2018. FINDINGS: Cardiovascular: Heart size is normal. There is no significant pericardial fluid, thickening or pericardial calcification. There is aortic atherosclerosis, as well as atherosclerosis of the great vessels of the mediastinum and the coronary arteries, including calcified atherosclerotic plaque in the left main, left anterior descending, left circumflex and right coronary arteries. Mediastinum/Nodes: Multiple enlarged mediastinal lymph nodes are noted throughout the right paratracheal nodal station, largest of which measure up to 2.5 cm in short axis (axial image 42 of series 3). No definite hilar lymphadenopathy confidently identified on today's noncontrast CT examination. Possible left subpectoral lymph node (axial image 16 of series 3) measuring 1.8 cm in short axis, although this may alternatively represent a dilated venous structure (poorly evaluated on today's noncontrast CT examination). Esophagus is unremarkable in appearance. No axillary lymphadenopathy. Lungs/Pleura: Trace bilateral pleural effusions (right greater than left) lying dependently. Ground-glass attenuation nodule in the left upper lobe (axial image 50 of series 5) measuring 1.7 x 1.5 cm. Other solid-appearing pulmonary nodules are also noted, largest of which is in the medial aspect of the left upper lobe (axial image 66 of series 4) measuring 8 x 7 mm. No confluent consolidative airspace disease. Mild diffuse bronchial wall thickening with mild centrilobular and paraseptal emphysema. Upper Abdomen: Cirrhotic liver with large mass centered in the right lobe of the liver, poorly demonstrated on today's noncontrast CT examination. Extensive lymphadenopathy in the upper abdomen partially  imaged. Small volume of ascites. Musculoskeletal: There are no aggressive appearing lytic or blastic lesions noted in the visualized portions of the skeleton. IMPRESSION: 1. Extensive lymphadenopathy in the mediastinum, and potentially in the left subpectoral region, highly concerning for metastatic disease in the thorax. Further evaluation with PET-CT should be considered if clinically appropriate. 2. Pulmonary nodules, most notably a solid-appearing pulmonary nodule in the left upper lobe (axial image 66 of series 4) measuring 8 x 7 mm. These are nonspecific, but the possibility of metastatic disease to the lungs should be considered. 3. Trace bilateral (right-greater-than-left) pleural effusions. 4. Cirrhotic liver with large mass and extensive upper abdominal lymphadenopathy and presumably malignant ascites, poorly demonstrated on today's noncontrast CT examination. Please see report from recently obtained abdominal MRI 09/03/2022 for full description. 5. Mild diffuse bronchial wall thickening with mild centrilobular and paraseptal emphysema; imaging findings suggestive of underlying COPD. 6. Aortic atherosclerosis, in addition to left  main and three-vessel coronary artery disease. Please note that although the presence of coronary artery calcium documents the presence of coronary artery disease, the severity of this disease and any potential stenosis cannot be assessed on this non-gated CT examination. Assessment for potential risk factor modification, dietary therapy or pharmacologic therapy may be warranted, if clinically indicated. Aortic Atherosclerosis (ICD10-I70.0) and Emphysema (ICD10-J43.9). Electronically Signed   By: Vinnie Langton M.D.   On: 09/06/2022 08:39   IR Paracentesis  Result Date: 09/06/2022 INDICATION: Presumed hepatocellular carcinoma with ascites. Request for diagnostic and therapeutic paracentesis. EXAM: ULTRASOUND GUIDED PARACENTESIS MEDICATIONS: 1% lidocaine 10 mL COMPLICATIONS:  None immediate. PROCEDURE: Informed written consent was obtained from the patient after a discussion of the risks, benefits and alternatives to treatment. A timeout was performed prior to the initiation of the procedure. Initial ultrasound scanning demonstrates a small amount of ascites within the right lateral abdomen. The right lateral abdomen was prepped and draped in the usual sterile fashion. 1% lidocaine was used for local anesthesia. Following this, a 19 gauge, 7-cm, Yueh catheter was introduced. An ultrasound image was saved for documentation purposes. The paracentesis was performed. The catheter was removed and a dressing was applied. The patient tolerated the procedure well without immediate post procedural complication. FINDINGS: A total of approximately 550 mL of serosanguineous fluid was removed. Samples were sent to the laboratory as requested by the clinical team. IMPRESSION: Successful ultrasound-guided paracentesis yielding 550 mL of peritoneal fluid. Procedure performed by: Gareth Eagle, PA-C Electronically Signed   By: Sandi Mariscal M.D.   On: 09/06/2022 06:24        Scheduled Meds:  folic acid  1 mg Oral Daily   gelatin adsorbable  1 each Topical Once   heparin  5,000 Units Subcutaneous Q8H   lactulose  20 g Oral BID   lidocaine (PF)  5 mL Intradermal Once   pantoprazole  40 mg Oral BID   propranolol  10 mg Oral BID   rifaximin  550 mg Oral BID   sodium bicarbonate  650 mg Oral TID   sodium chloride flush  3 mL Intravenous Q12H   thiamine  100 mg Oral Daily   Continuous Infusions:  Albumin human 25 % 100 g     cefTRIAXone (ROCEPHIN)  IV Stopped (09/06/22 2325)          Aline August, MD [facility] 09/07/2022, 8:28 AM

## 2022-09-07 NOTE — Progress Notes (Addendum)
[facility] Progress Note  Calvin [name] 61 y.o. 1961-01-12  CC: Liver mass, hepatic encephalopathy, cirrhosis   Subjective: Patient seen and examined at bedside.  His brother is at bedside.  Diagnosis of SBP and need for lifelong antibiotics discussed.  Also discussed with one of the cousin over the phone.  They would like to go home prior to Christmas.  ROS : Afebrile, negative for chest pain   Objective: Vital signs in last 24 hours: Vitals:   09/07/22 0522 09/07/22 0749  BP: (!) 144/87 134/80  Pulse: 89 94  Resp: 18 17  Temp: 98.4 F (36.9 C) 98.2 F (36.8 C)  SpO2: 100% 100%    Physical Exam: He is alert but somewhat confused.  His abdomen is moderately distended with some fullness but otherwise nontender, bowel sounds present.  Mood and affect normal.  Lab Results: Recent Labs    09/06/22 0248 09/07/22 0212  NA 138 138  K 3.9 4.1  CL 105 106  CO2 21* 22  GLUCOSE 96 94  BUN 19 16  CREATININE 1.18 1.02  CALCIUM 10.6* 10.7*  MG 1.7 1.6*   Recent Labs    09/06/22 0248 09/07/22 0212  AST 55* 39  ALT 45* 36  ALKPHOS 176* 165*  BILITOT 2.5* 3.1*  PROT 7.6 7.5  ALBUMIN 3.2* 3.7   Recent Labs    09/06/22 0248 09/07/22 0212  WBC 21.5* 20.9*  NEUTROABS 15.6* 15.3*  HGB 9.9* 8.9*  HCT 28.1* 25.4*  MCV 90.6 89.4  PLT 224 192   No results for input(s): "LABPROT", "INR" in the last 72 hours.     Assessment/Plan: -Hepatocellular carcinoma.  Liver biopsy September 04, 2022 showed moderate to poorly differentiated hepatocellular carcinoma.  Likely metastatic tumor.  Seen by oncology.   -Cirrhosis from history of hepatitis C and alcohol use.  Complicated by encephalopathy and portal hypertension with esophageal varices.   -Acute kidney injury.  Resolved.   -SBP.  Underwent paracentesis for persistent leukocytosis which came back positive for SBP.   Recommendations ------------------------- -Leukocytosis finally trending down. -Continue  IV Rocephin and albumin today. -He will need lifelong ciprofloxacin adjusted for kidney function for his SBP. -Okay to start low-dose diuretics with Lasix 20 mg and spironolactone 25 mg. -He will need close follow-up for repeat blood work which can be done by home palliative services.  At least he may need repeat CBC and CMP in 1 week after discharge. -Recommend to continue lactulose and Xifaxan for encephalopathy. -Okay to discharge from GI standpoint tomorrow if leukocytosis continues to improve.  Patient wants to go home before Christmas and given his likely metastatic hepatocellular carcinoma, I think it is reasonable to discharge him tomorrow or day after tomorrow so he can celebrate Christmas  at his home. -DC propranolol as beta-blocker is contraindicated with SBP. -GI will sign off.  Call us back if needed.   Otis Brace MD, [place] 09/07/2022, 12:53 PM  Contact #  [phone]

## 2022-09-07 NOTE — Plan of Care (Signed)
  Problem: Clinical Measurements: Goal: Diagnostic test results will improve Outcome: Progressing   Problem: Activity: Goal: Risk for activity intolerance will decrease Outcome: Progressing   Problem: Nutrition: Goal: Adequate nutrition will be maintained Outcome: Progressing

## 2022-09-08 LAB — COMPREHENSIVE METABOLIC PANEL
ALT: 29 U/L (ref 0–44)
AST: 40 U/L (ref 15–41)
Albumin: 4.2 g/dL (ref 3.5–5.0)
Alkaline Phosphatase: 138 U/L — ABNORMAL HIGH (ref 38–126)
Anion gap: 11 (ref 5–15)
BUN: 14 mg/dL (ref 8–23)
CO2: 23 mmol/L (ref 22–32)
Calcium: 11 mg/dL — ABNORMAL HIGH (ref 8.9–10.3)
Chloride: 106 mmol/L (ref 98–111)
Creatinine, Ser: 0.97 mg/dL (ref 0.61–1.24)
GFR, Estimated: >60 mL/min (ref >60)
Glucose, Bld: 92 mg/dL (ref 70–99)
Potassium: 3.4 mmol/L — ABNORMAL LOW (ref 3.5–5.1)
Sodium: 140 mmol/L (ref 135–145)
Total Bilirubin: 3.7 mg/dL — ABNORMAL HIGH (ref 0.3–1.2)
Total Protein: 7.5 g/dL (ref 6.5–8.1)

## 2022-09-08 LAB — CBC WITH DIFFERENTIAL/PLATELET
Abs Immature Granulocytes: 0.16 10*3/uL — ABNORMAL HIGH (ref 0.00–0.07)
Basophils Absolute: 0.1 10*3/uL (ref 0.0–0.1)
Basophils Relative: 1 %
Eosinophils Absolute: 0.2 10*3/uL (ref 0.0–0.5)
Eosinophils Relative: 1 %
HCT: 24.3 % — ABNORMAL LOW (ref 39.0–52.0)
Hemoglobin: 8.4 g/dL — ABNORMAL LOW (ref 13.0–17.0)
Immature Granulocytes: 1 %
Lymphocytes Relative: 12 %
Lymphs Abs: 2.2 10*3/uL (ref 0.7–4.0)
MCH: 31.2 pg (ref 26.0–34.0)
MCHC: 34.6 g/dL (ref 30.0–36.0)
MCV: 90.3 fL (ref 80.0–100.0)
Monocytes Absolute: 2.1 10*3/uL — ABNORMAL HIGH (ref 0.1–1.0)
Monocytes Relative: 11 %
Neutro Abs: 13.9 10*3/uL — ABNORMAL HIGH (ref 1.7–7.7)
Neutrophils Relative %: 74 %
Platelets: 207 10*3/uL (ref 150–400)
RBC: 2.69 MIL/uL — ABNORMAL LOW (ref 4.22–5.81)
RDW: 17.7 % — ABNORMAL HIGH (ref 11.5–15.5)
WBC: 18.6 10*3/uL — ABNORMAL HIGH (ref 4.0–10.5)
nRBC: 0.3 % — ABNORMAL HIGH (ref 0.0–0.2)

## 2022-09-08 LAB — MAGNESIUM: Magnesium: 1.8 mg/dL (ref 1.7–2.4)

## 2022-09-08 MED ORDER — CIPROFLOXACIN HCL 500 MG PO TABS: 500.0000 mg | ORAL_TABLET | Freq: Two times a day (BID) | ORAL | 0 refills | Status: AC

## 2022-09-08 MED ORDER — SPIRONOLACTONE 25 MG PO TABS: 25.0000 mg | ORAL_TABLET | Freq: Every day | ORAL | 0 refills | Status: AC

## 2022-09-08 MED ORDER — POLYETHYLENE GLYCOL 3350 17 G PO PACK: 17.0000 g | PACK | Freq: Every day | ORAL | 0 refills | Status: AC | PRN

## 2022-09-08 NOTE — Discharge Summary (Signed)
Physician Discharge Summary  Calvin [name] WJX:[id number] DOB: [birth year] DOA: 09/01/2022  PCP: Jenny Reichmann, PA-C  Admit date: 09/01/2022 Discharge date: 09/08/2022  Admitted From: Home Disposition: Home  Recommendations for Outpatient Follow-up:  Follow up with PCP in 1 week with repeat CBC/CMP Outpatient follow-up with GI, palliative care, oncology Follow up in ED if symptoms worsen or new appear   Home Health: No Equipment/Devices: None  Discharge Condition: Guarded to poor CODE STATUS: DNR  diet recommendation: Heart healthy/fluid restriction of up to 1200 cc a day  Brief/Interim Summary: 61 y.o. male with medical history significant of hyperlipidemia, hypertension, anemia, alcohol use, TIA/stroke, cirrhosis, hepatitis C, right eye blindness presented with weakness and fall.  On presentation, creatinine was 5.4 from baseline of 1.4 with AST of 59, ALT 55, ALP 231, T. bili of 2 and WBC of 17.6.  Respiratory panel for flu and COVID-19 were negative.  Chest x-ray showed no acute abnormality.  CT head without contrast showed no acute abnormality.  CT renal stone study showed large mass at the right hepatic lobe near the IVC with reactive adenopathy suspicious for malignancy along with inguinal hernia and cirrhosis.  He was started on IV fluids.  GI was consulted.  MRI showed diffuse hepatic tumor.  He underwent a liver biopsy by IR on 09/04/2022.  Subsequently, oncology was consulted.  He underwent IR paracentesis on 09/05/2022 and subsequently started on Rocephin for possible SBP.  Subsequently, leukocytosis is slightly improving.  He is hemodynamically stable.  He will be discharged home today on oral ciprofloxacin to finish total 2 weeks course of therapy.  Overall prognosis is guarded to poor.  Outpatient follow-up with PCP/GI/oncology/palliative care.  Discharge Diagnoses:   AKI Uremia Acute metabolic acidosis -Creatinine 5.4 on presentation with baseline of 1.4.   Treated with IV fluids.  Creatinine has improved; 0.97 today.  Off IV fluids.   Acidosis improved.  Currently on oral sodium bicarbonate tablets: DC on discharge..  Outpatient follow-up of BMP  Liver mass Pulmonary nodules with mediastinal lymphadenopathy Elevated LFTs Possible decompensated cirrhosis of liver with ascites from history of chronic hepatitis C and alcohol abuse Possible hepatoencephalopathy - MRI of abdomen shows findings of diffuse hepatic tumor: Findings raise the question of primary hepatic neoplasm with metastatic disease in the abdomen with developing anasarca.  Status post liver biopsy by IR on 09/04/2022.  Follow pathology.  Oncology following.  Will need outpatient follow-up with oncology.  CT chest showed pulmonary nodules with residual lymphadenopathy most likely metastatic. -AFP significantly elevated -CT/MRI of brain without contrast was negative for acute intracranial abnormality -Continue rifaximin and lactulose.  Propranolol discontinued by GI because of SBP.  He has been started on Lasix and spironolactone by GI.  GI has signed off and recommended outpatient follow-up. -palliative care following and recommend home with palliative services.   Leukocytosis Possible SBP -Status post IR paracentesis on 09/05/2022 and removal of 550 cc fluid.  Peritoneal fluid analysis consistent with possible SBP.  Currently on Rocephin. -WBCs still elevated but slightly improving today.    -Discharge home today on oral ciprofloxacin for 10 more days.  Anemia of chronic disease -Possibly from chronic illnesses.  Hemoglobin stable.  Monitor intermittently as an outpatient   Essential hypertension -Blood pressure improving.  Amlodipine to remain on hold.  Lasix and spironolactone plan as above.   Hypomagnesemia -Improved  Physical deconditioning -PT recommends home health PT  Hypokalemia -Mild.  Outpatient follow-up.    Discharge Instructions  Discharge Instructions  Diet - low sodium heart healthy   Complete by: As directed    Increase activity slowly   Complete by: As directed    No wound care   Complete by: As directed       Allergies as of 09/08/2022   No Known Allergies      Medication List     STOP taking these medications    amLODipine 5 MG tablet Commonly known as: NORVASC   nicotine 21 mg/24hr patch Commonly known as: NICODERM CQ - dosed in mg/24 hours   propranolol 10 MG tablet Commonly known as: INDERAL       TAKE these medications    allopurinol 300 MG tablet Commonly known as: ZYLOPRIM Take 300 mg by mouth daily.   buprenorphine 7.5 MCG/HR Commonly known as: BUTRANS 1 patch once a week.   ciprofloxacin 500 MG tablet Commonly known as: Cipro Take 1 tablet (500 mg total) by mouth 2 (two) times daily for 10 days.   diphenhydrAMINE 25 MG tablet Commonly known as: BENADRYL Take 25 mg by mouth at bedtime as needed for itching or sleep.   folic acid 1 MG tablet Commonly known as: FOLVITE Take 1 mg by mouth daily.   furosemide 20 MG tablet Commonly known as: LASIX Take 20 mg by mouth daily.   gabapentin 300 MG capsule Commonly known as: NEURONTIN Take 300 mg by mouth daily.   hydrocortisone cream 1 % Apply 1 application topically 3 (three) times daily as needed for itching.   lactulose 10 GM/15ML solution Commonly known as: CHRONULAC Take 30 mLs (20 g total) by mouth 2 (two) times daily.   omeprazole 20 MG tablet Commonly known as: PriLOSEC OTC Take 2 tablets (40 mg total) by mouth daily.   Oxycodone HCl 10 MG Tabs Take 10 mg by mouth in the morning, at noon, and at bedtime.   polyethylene glycol 17 g packet Commonly known as: MIRALAX / GLYCOLAX Take 17 g by mouth daily as needed for mild constipation.   rifaximin 550 MG Tabs tablet Commonly known as: XIFAXAN Take 550 mg by mouth 2 (two) times daily.   senna 8.6 MG Tabs tablet Commonly known as: SENOKOT Take 2 tablets by mouth daily as  needed for mild constipation.   spironolactone 25 MG tablet Commonly known as: ALDACTONE Take 1 tablet (25 mg total) by mouth daily.   thiamine 100 MG tablet Commonly known as: VITAMIN B1 Take 1 tablet (100 mg total) by mouth daily.   tiZANidine 4 MG tablet Commonly known as: ZANAFLEX Take 4 mg by mouth 3 (three) times daily as needed for muscle spasms.        Follow-up Information     [facility] Follow up.   Specialty: Home Health Services Contact information: [address] [place] [address] [phone]         Jenny Reichmann, [place]. Schedule an appointment as soon as possible for a visit in 1 week(s).   Specialty: Physician Assistant Contact information: [place] [id number] [phone]         Truitt Merle, MD. Schedule an appointment as soon as possible for a visit in 1 week(s).   Specialties: Hematology, Oncology Contact information: [place] [address] [phone]         Otis Brace, MD. Schedule an appointment as soon as possible for a visit in 1 week(s).   Specialty: Gastroenterology Contact information: [place]  [address] [place] [address] [phone]                No Known Allergies  Consultations: GI/IR/oncology/palliative care    Procedures/Studies: CT CHEST WO CONTRAST  Result Date: 09/06/2022 CLINICAL DATA:  61 year old male with history of hepatocellular carcinoma. Staging examination. * Tracking Code: BO * EXAM: CT CHEST WITHOUT CONTRAST TECHNIQUE: Multidetector CT imaging of the chest was performed following the standard protocol without IV contrast. RADIATION DOSE REDUCTION: This exam was performed according to the departmental dose-optimization program which includes automated exposure control, adjustment of the mA and/or kV according to patient size and/or use of iterative reconstruction technique. COMPARISON:  Chest  CTA 02/15/2018. FINDINGS: Cardiovascular: Heart size is normal. There is no significant pericardial fluid, thickening or pericardial calcification. There is aortic atherosclerosis, as well as atherosclerosis of the great vessels of the mediastinum and the coronary arteries, including calcified atherosclerotic plaque in the left main, left anterior descending, left circumflex and right coronary arteries. Mediastinum/Nodes: Multiple enlarged mediastinal lymph nodes are noted throughout the right paratracheal nodal station, largest of which measure up to 2.5 cm in short axis (axial image 42 of series 3). No definite hilar lymphadenopathy confidently identified on today's noncontrast CT examination. Possible left subpectoral lymph node (axial image 16 of series 3) measuring 1.8 cm in short axis, although this may alternatively represent a dilated venous structure (poorly evaluated on today's noncontrast CT examination). Esophagus is unremarkable in appearance. No axillary lymphadenopathy. Lungs/Pleura: Trace bilateral pleural effusions (right greater than left) lying dependently. Ground-glass attenuation nodule in the left upper lobe (axial image 50 of series 5) measuring 1.7 x 1.5 cm. Other solid-appearing pulmonary nodules are also noted, largest of which is in the medial aspect of the left upper lobe (axial image 66 of series 4) measuring 8 x 7 mm. No confluent consolidative airspace disease. Mild diffuse bronchial wall thickening with mild centrilobular and paraseptal emphysema. Upper Abdomen: Cirrhotic liver with large mass centered in the right lobe of the liver, poorly demonstrated on today's noncontrast CT examination. Extensive lymphadenopathy in the upper abdomen partially imaged. Small volume of ascites. Musculoskeletal: There are no aggressive appearing lytic or blastic lesions noted in the visualized portions of the skeleton. IMPRESSION: 1. Extensive lymphadenopathy in the mediastinum, and potentially in  the left subpectoral region, highly concerning for metastatic disease in the thorax. Further evaluation with PET-CT should be considered if clinically appropriate. 2. Pulmonary nodules, most notably a solid-appearing pulmonary nodule in the left upper lobe (axial image 66 of series 4) measuring 8 x 7 mm. These are nonspecific, but the possibility of metastatic disease to the lungs should be considered. 3. Trace bilateral (right-greater-than-left) pleural effusions. 4. Cirrhotic liver with large mass and extensive upper abdominal lymphadenopathy and presumably malignant ascites, poorly demonstrated on today's noncontrast CT examination. Please see report from recently obtained abdominal MRI 09/03/2022 for full description. 5. Mild diffuse bronchial wall thickening with mild centrilobular and paraseptal emphysema; imaging findings suggestive of underlying COPD. 6. Aortic atherosclerosis, in addition to left main and three-vessel coronary artery disease. Please note that although the presence of coronary artery calcium documents the presence of coronary artery disease, the severity of this disease and any potential stenosis cannot be assessed on this non-gated CT examination. Assessment for potential risk factor modification, dietary therapy or pharmacologic therapy may be warranted, if clinically indicated. Aortic Atherosclerosis (ICD10-I70.0) and Emphysema (ICD10-J43.9). Electronically Signed   By: Vinnie Langton M.D.   On: 09/06/2022 08:39   IR Paracentesis  Result Date: 09/06/2022 INDICATION: Presumed hepatocellular carcinoma with ascites. Request for diagnostic and therapeutic paracentesis. EXAM: ULTRASOUND GUIDED PARACENTESIS MEDICATIONS: 1% lidocaine 10 mL COMPLICATIONS: None immediate. PROCEDURE: Informed written consent was obtained from the patient after a discussion of the risks, benefits and alternatives to treatment. A timeout was performed prior to the initiation of the procedure. Initial  ultrasound scanning demonstrates a small amount of ascites within the right lateral abdomen. The right lateral abdomen was prepped and draped in the usual sterile fashion. 1% lidocaine was used for local anesthesia. Following this, a 19 gauge, 7-cm, Yueh catheter was introduced. An ultrasound image was saved for documentation purposes. The paracentesis was performed. The catheter was removed and a dressing was applied. The patient tolerated the procedure well without immediate post procedural complication. FINDINGS: A total of approximately 550 mL of serosanguineous fluid was removed. Samples were sent to the laboratory as requested by the clinical team. IMPRESSION: Successful ultrasound-guided paracentesis yielding 550 mL of peritoneal fluid. Procedure performed by: Gareth Eagle, PA-C Electronically Signed   By: Sandi Mariscal M.D.   On: 09/06/2022 06:24   US BIOPSY (LIVER)  Result Date: 09/04/2022 INDICATION: Multiple liver lesions EXAM: Ultrasound-guided liver mass biopsy MEDICATIONS: None. ANESTHESIA/SEDATION: Moderate (conscious) sedation was employed during this procedure. A total of Versed 0.5 mg and Fentanyl 50 mcg was administered intravenously by the radiology nurse. Total intra-service moderate Sedation Time: 13 minutes. The patient's level of consciousness and vital signs were monitored continuously by radiology nursing throughout the procedure under my direct supervision. COMPLICATIONS: None immediate. PROCEDURE: Informed written consent was obtained from the patient after a thorough discussion of the procedural risks, benefits and alternatives. All questions were addressed. Maximal Sterile Barrier Technique was utilized including caps, mask, sterile gowns, sterile gloves, sterile drape, hand hygiene and skin antiseptic. A timeout was performed prior to the initiation of the procedure. Patient position supine on the ultrasound table. Epigastric skin prepped and draped in usual sterile fashion.  Following local lidocaine administration, 17 gauge introducer needle was advanced into 1 of the left liver masses, and 4- 18 gauge cores were obtained utilizing continuous ultrasound guidance. Gelfoam slurry was administered through the introducer needle at the biopsy site. Samples were sent to pathology in formalin. Needle removed and hemostasis achieved with 5 minutes of manual compression. Post procedure ultrasound images showed no evidence of significant hemorrhage. IMPRESSION: Ultrasound-guided biopsy of left liver mass. Electronically Signed   By: Miachel Roux M.D.   On: 09/04/2022 13:52   MR LIVER WO CONRTAST  Result Date: 09/03/2022 CLINICAL DATA:  Suspected liver lesions in a 61 year old male. No contrast was administered for this exam. Only limited imaging was acquired at patient request. Patient asked to discontinue the exam. EXAM: MRI ABDOMEN WITHOUT CONTRAST TECHNIQUE: Multiplanar multisequence MR imaging was performed without the administration of intravenous contrast. COMPARISON:  CT of the abdomen and pelvis performed on September 01, 2022. FINDINGS: Lower chest: Incidental imaging of the lower chest is unremarkable to the extent evaluated aside from small bilateral pleural effusions, very limited assessment on this abdominal MRI. Hepatobiliary: Nodular surface contour the liver compatible with cirrhotic morphology though assessment limited due to the markedly abnormal appearance of the liver which is filled with tumor. Dominant lesion in the RIGHT hepatic lobe 8.6 x 6.7 cm (image 17/4) tumor displays bilobar involvement and is greatest on the RIGHT. Next largest hepatic tumor as a discrete lesion that can be visualized in the RIGHT hepatic lobe is in the range of 6.3  cm. Tumors ranging between 2.5 and 2.8 cm in the lateral segment of the LEFT hepatic lobe (image 21/4. Portal vein cannot be assessed but may be involved with tumor and or displaced. Area most suspicious for portal vein involvement  seen on image 20/4 with some expansion and intermediate signal that is contiguous with celiac adenopathy. No gross biliary duct dilation. Edematous appearing gallbladder with cholelithiasis remains nonspecific in the setting of generalized edema throughout the abdomen. Pancreas: Not well assessed. Generalized edema throughout the abdomen. Masses which are contiguous with the pancreas but potentially represent lymph nodes, for instance on image 30/4 an area measuring 3.3 cm is more likely a necrotic lymph node but cannot be well evaluated. No ductal dilation of the pancreas. Spleen:  Spleen is top-normal size. Adrenals/Urinary Tract: No hydronephrosis. Large RIGHT adrenal mass is suspected along with tumor implant adjacent to the RIGHT adrenal and RIGHT kidney. Tumor implant on image 19/42.6 cm. RIGHT adrenal inseparable from medial RIGHT hepatic lobe mass. LEFT adrenal gland is normal. Stomach/Bowel: Extensive bowel edema of uncertain significance in this patient with suspected liver disease. Bowel not well assessed. Vascular/Lymphatic: Normal caliber of the abdominal aorta. Potential portal vein invasion from tumor. Adenopathy in the retroperitoneum and upper abdomen. Other: Small volume ascites and diffuse mesenteric and body wall edema, suspect this is slightly increased compared to previous imaging. Musculoskeletal: No gross abnormalities, limited assessment. IMPRESSION: 1. Findings of diffuse hepatic tumor worse in the RIGHT hepatic lobe, bilobar involvement with extensive adenopathy in the upper abdomen. 2. Question of portal vein involvement though this areas not well assessed. Study is highly limited due to motion and incomplete acquisition of even sequences that would constitute a noncontrast abdominal MRI. No contrast was given in the study was terminated early at patient request. 3. Findings raise the question of primary hepatic neoplasm with metastatic disease in the abdomen. Tumor marker correlation may  be helpful. In the event the tumor markers are equivocal tissue sampling may be helpful. 4. Developing anasarca. Electronically Signed   By: Zetta Bills M.D.   On: 09/03/2022 17:49   MR BRAIN WO CONTRAST  Result Date: 09/01/2022 CLINICAL DATA:  Acute neurologic deficit EXAM: MRI HEAD WITHOUT CONTRAST TECHNIQUE: Multiplanar, multiecho pulse sequences of the brain and surrounding structures were obtained without intravenous contrast. COMPARISON:  07/28/2020 FINDINGS: Brain: No acute infarct, mass effect or extra-axial collection. No acute or chronic hemorrhage. There is multifocal hyperintense T2-weighted signal within the white matter. Generalized volume loss. The midline structures are normal. Vascular: Major flow voids are preserved. Skull and upper cervical spine: Normal calvarium and skull base. Visualized upper cervical spine and soft tissues are normal. Sinuses/Orbits:No paranasal sinus fluid levels or advanced mucosal thickening. No mastoid or middle ear effusion. 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 No acute intracranial abnormality. 2. Findings of chronic small vessel ischemia and volume loss. Electronically Signed   By: Ulyses Jarred M.D.   On: 09/01/2022 18:54   CT Renal Stone Study  Result Date: 09/01/2022 CLINICAL DATA:  Abdominal and flank pain EXAM: CT ABDOMEN AND PELVIS WITHOUT CONTRAST TECHNIQUE: Multidetector CT imaging of the abdomen and pelvis was performed following the standard protocol without IV contrast. RADIATION DOSE REDUCTION: This exam was performed according to the departmental dose-optimization program which includes automated exposure control, adjustment of the mA and/or kV according to patient size and/or use of iterative reconstruction technique. COMPARISON:  Abdominal ultrasound 12/04/2021 and CT abdomen 07/27/2020 FINDINGS: Lower chest: Paraseptal emphysema. Left  anterior descending and right coronary artery atherosclerosis.  Bilateral gynecomastia. Right paraesophageal node 2.1 cm in short axis on image 13 series 3, new compared to the prior exam. Hepatobiliary: Indistinct mass density within and along the medial margin the right hepatic lobe substantially abuts the right kidney upper pole. I suspect that this is probably arising from the liver given the degree of hepatic heterogeneity which may represent other lesions in the right hepatic lobe and segment 4. Nodular liver contour possibly from cirrhosis or pseudo cirrhosis. Underlying mass difficult to measure but potentially around 10 cm in diameter. Right adrenal gland indistinguishable from the mass. The dominant mass likewise substantially abuts the IVC and may extend in the right porta hepatis region adjacent to the pancreatic head. 1.9 cm gallstone in the gallbladder. Pancreas: As noted above there is indistinctness of fat planes around the pancreatic head possibly due to extension of the hepatic mass in this vicinity, or local adenopathy. A pancreatic primary malignancy is not excluded. Spleen: Unremarkable Adrenals/Urinary Tract: The right adrenal gland is surrounded by indistinct density favoring tumor. Left adrenal gland unremarkable. Right kidney upper pole abutted by the right mass extending along the hepatic renal space. Nonobstructive 0.5 cm right kidney lower pole calculus. Urinary bladder unremarkable. Stomach/Bowel: Direct right inguinal hernia containing a loop of bowel with an air-level and possibly a small amount of ascites. No dilated bowel extending into this hernia to suggest obstruction. No pneumatosis. Vascular/Lymphatic: The mass along the right hepatic lobe is substantially in the vicinity of the IVC and may be exerting mass effect on the IVC. There appears to be porta hepatis and peripancreatic adenopathy, including a 2.8 cm portacaval lymph node and a 2.2 cm peripancreatic node on images 37 and 24 of series 3, respectively. Right retrocrural node 1.1 cm in  short axis, image 19 series 3. Aortocaval node 1.6 cm in short axis, image 42 series 3. Separate aortocaval node 2.3 cm in short axis, image 50 series 3. Atherosclerosis is present, including aortoiliac atherosclerotic disease. Reproductive: Unremarkable Other: Trace edema along the right paracolic gutter. Musculoskeletal: Left total hip prosthesis introducing streak artifact. 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 In conjunction with lumbar spondylosis and degenerative disc disease there is substantial bilateral foraminal impingement at L3-4, L4-5, and L5-S1. Old posterior fractures the right eleventh and twelfth ribs. IMPRESSION: 1. Large mass along the medial margin of the right hepatic lobe substantially in the vicinity of the IVC and right adrenal gland, probably arising from the liver given the degree of heterogeneity of the liver parenchyma. There is pathologic porta hepatis, peripancreatic, retrocrural, lower thoracic paraesophageal, and retroperitoneal adenopathy. The appearance is highly suspicious for malignancy, probably hepatocellular carcinoma with extrahepatic extension given the underlying cirrhosis, although other malignancies are not excluded. The dominant mass angles the left adrenal gland and substantially abuts the IVC and right kidney upper pole. Oncology referral and tissue diagnosis is recommended. Comprehensive staging imaging such as nuclear medicine PET-CT recommended. Patency of the IVC and portal vein could be affected by this mass, but not assessed on noncontrast CT. 2. Direct right inguinal hernia containing a loop of bowel with an air-level and possibly a small amount of ascites. No dilated bowel extending into this hernia to suggest obstruction. 3. Nodular liver contour possibly from cirrhosis or less likely, pseudo-cirrhosis. 4. Other imaging findings of potential clinical significance:  Coronary atherosclerosis. Paraseptal emphysema. Cholelithiasis. Nonobstructive  right kidney lower pole calculus. 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 Lumbar spondylosis and degenerative disc disease causing substantial bilateral foraminal impingement at L3-4, L4-5, and L5-S1. Old posterior fractures the right eleventh and twelfth ribs. Aortic and systemic atherosclerosis. Aortic Atherosclerosis (ICD10-I70.0) and Emphysema (ICD10-J43.9). Electronically Signed   By: Van Clines M.D.   On: 09/01/2022 15:36   CT Head Wo Contrast  Result Date: 09/01/2022 CLINICAL DATA:  Mental status changes, weakness EXAM: CT HEAD WITHOUT CONTRAST TECHNIQUE: Contiguous axial images were obtained from the base of the skull through the vertex without intravenous contrast. RADIATION DOSE REDUCTION: This exam was performed according to the departmental dose-optimization program which includes automated exposure control, adjustment of the mA and/or kV according to patient size and/or use of iterative reconstruction technique. COMPARISON:  07/27/2020 FINDINGS: Brain: Stable mild atrophy pattern without acute intracranial hemorrhage, mass lesion, new infarction, midline shift, herniation, hydrocephalus, or extra-axial fluid collection. No focal mass effect or edema. Cisterns are patent. No cerebellar abnormality. Vascular: No hyperdense vessel or unexpected calcification. Skull: Normal. Negative for fracture or focal lesion. Sinuses/Orbits: Clear sinuses. No acute orbital abnormality. Chronic calcifications of the right globe. Remote right orbital fracture medially. Other: None. IMPRESSION: Stable mild atrophy pattern. No acute intracranial abnormality by noncontrast CT. No interval change. Electronically Signed   By: Jerilynn Mages.  Shick M.D.   On: 09/01/2022 11:00   DG Chest 2 View  Result Date: 09/01/2022 CLINICAL DATA:  Weakness, shortness of breath. EXAM: CHEST - 2 VIEW COMPARISON:  Chest  radiographs dated August 05, 2020 FINDINGS: The heart size and mediastinal contours are within normal limits. Both lungs are clear. Moderate bilateral acromioclavicular osteoarthritis. Mild thoracic spondylosis. IMPRESSION: 1. No active cardiopulmonary disease. 2. Moderate bilateral acromioclavicular osteoarthritis. Electronically Signed   By: Keane Police D.O.   On: 09/01/2022 10:24      Subjective: Patient seen and examined at bedside.  Brother at bedside as well.  Wants to go home today.  Denies worsening abdominal pain or fever, no worsening shortness of breath.  Discharge Exam: Vitals:   09/07/22 2100 09/08/22 0521  BP: (!) 143/89 133/87  Pulse: 100 95  Resp: 17 18  Temp: 97.8 F (36.6 C) 98.7 F (37.1 C)  SpO2: 98% 97%    General: Pt is alert, awake, not in acute distress.  Looks chronically ill and deconditioned.  Slow to respond.  Speech is not back comprehensible.  On room air. Cardiovascular: rate controlled, S1/S2 + Respiratory: bilateral decreased breath sounds at bases Abdominal: Soft, distended, bowel sounds + Extremities: Trace lower extremity edema; no cyanosis    The results of significant diagnostics from this hospitalization (including imaging, microbiology, ancillary and laboratory) are listed below for reference.     Microbiology: Recent Results (from the past 240 hour(s))  Resp panel by RT-PCR (RSV, Flu A&B, Covid) Anterior Nasal Swab     Status: None   Collection Time: 09/01/22  1:49 PM   Specimen: Anterior Nasal Swab  Result Value Ref Range Status   SARS Coronavirus 2 by RT PCR NEGATIVE NEGATIVE Final    Comment: (NOTE) SARS-CoV-2 target nucleic acids are NOT DETECTED.  The SARS-CoV-2 RNA is generally detectable in upper respiratory specimens during the acute phase of infection. The lowest concentration of SARS-CoV-2 viral copies this assay can detect is 138 copies/mL. A negative result does not preclude SARS-Cov-2 infection and should not be used  as the sole basis for treatment or other patient  management decisions. A negative result may occur with  improper specimen collection/handling, submission of specimen other than nasopharyngeal swab, presence of viral mutation(s) within the areas targeted by this assay, and inadequate number of viral copies(<138 copies/mL). A negative result must be combined with clinical observations, patient history, and epidemiological information. The expected result is Negative.  Fact Sheet for Patients:  EntrepreneurPulse.com.au  Fact Sheet for Healthcare Providers:  IncredibleEmployment.be  This test is no t yet approved or cleared by the [place] FDA and  has been authorized for detection and/or diagnosis of SARS-CoV-2 by FDA under an Emergency Use Authorization (EUA). This EUA will remain  in effect (meaning this test can be used) for the duration of the COVID-19 declaration under Section 564(b)(1) of the Act, 21 U.S.C.section 360bbb-3(b)(1), unless the authorization is terminated  or revoked sooner.       Influenza A by PCR NEGATIVE NEGATIVE Final   Influenza B by PCR NEGATIVE NEGATIVE Final    Comment: (NOTE) The Xpert Xpress SARS-CoV-2/FLU/RSV plus assay is intended as an aid in the diagnosis of influenza from Nasopharyngeal swab specimens and should not be used as a sole basis for treatment. Nasal washings and aspirates are unacceptable for Xpert Xpress SARS-CoV-2/FLU/RSV testing.  Fact Sheet for Patients: EntrepreneurPulse.com.au  Fact Sheet for Healthcare Providers: IncredibleEmployment.be  This test is not yet approved or cleared by the [place] FDA and has been authorized for detection and/or diagnosis of SARS-CoV-2 by FDA under an Emergency Use Authorization (EUA). This EUA will remain in effect (meaning this test can be used) for the duration of the COVID-19 declaration under Section 564(b)(1)  of the Act, 21 U.S.C. section 360bbb-3(b)(1), unless the authorization is terminated or revoked.     Resp Syncytial Virus by PCR NEGATIVE NEGATIVE Final    Comment: (NOTE) Fact Sheet for Patients: EntrepreneurPulse.com.au  Fact Sheet for Healthcare Providers: IncredibleEmployment.be  This test is not yet approved or cleared by the [place] FDA and has been authorized for detection and/or diagnosis of SARS-CoV-2 by FDA under an Emergency Use Authorization (EUA). This EUA will remain in effect (meaning this test can be used) for the duration of the COVID-19 declaration under Section 564(b)(1) of the Act, 21 U.S.C. section 360bbb-3(b)(1), unless the authorization is terminated or revoked.  Performed at [facility], [place] [address], [place], [place] [address]   Culture, body fluid w Gram Stain-bottle     Status: None (Preliminary result)   Collection Time: 09/05/22  4:10 PM   Specimen: Fluid  Result Value Ref Range Status   Specimen Description FLUID PERITONEAL  Final   Special Requests BOTTLES DRAWN AEROBIC AND ANAEROBIC  Final   Culture   Final    NO GROWTH 3 DAYS Performed at [facility], [place] [address] [place], [place] [address]    Report Status PENDING  Incomplete  Gram stain     Status: None   Collection Time: 09/05/22  4:10 PM   Specimen: Fluid  Result Value Ref Range Status   Specimen Description FLUID PERITONEAL  Final   Special Requests NONE  Final   Gram Stain   Final    WBC PRESENT, PREDOMINANTLY PMN NO ORGANISMS SEEN CYTOSPIN SMEAR Performed at [facility], [place] [address], [place], [place] [address]    Report Status 09/05/2022 FINAL  Final     Labs: BNP (last 3 results) Recent Labs    09/01/22 0951  BNP 63.7   Basic Metabolic Panel: Recent  Labs  Lab 09/04/22 0252 09/05/22 0258 09/06/22 0248 09/07/22 0212 09/08/22 0341  NA 142 138 138 138 140  K 3.7 3.6 3.9 4.1 3.4*  CL 111 108 105  106 106  CO2 20* 18* 21* 22 23  GLUCOSE 101* 97 96 94 92  BUN 30* 27* '19 16 14  '$ CREATININE 1.01 1.06 1.18 1.02 0.97  CALCIUM 10.0 10.0 10.6* 10.7* 11.0*  MG 1.3* 1.5* 1.7 1.6* 1.8   Liver Function Tests: Recent Labs  Lab 09/04/22 0252 09/05/22 0258 09/06/22 0248 09/07/22 0212 09/08/22 0341  AST 36 44* 55* 39 40  ALT 31 32 45* 36 29  ALKPHOS 139* 155* 176* 165* 138*  BILITOT 2.6* 2.1* 2.5* 3.1* 3.7*  PROT 7.1 6.9 7.6 7.5 7.5  ALBUMIN 3.4* 3.1* 3.2* 3.7 4.2   Recent Labs  Lab 09/01/22 0945  LIPASE 42   Recent Labs  Lab 09/01/22 2121 09/04/22 0252  AMMONIA 71* 46*   CBC: Recent Labs  Lab 09/04/22 0252 09/05/22 0258 09/06/22 0248 09/07/22 0212 09/08/22 0341  WBC 17.1* 16.6* 21.5* 20.9* 18.6*  NEUTROABS 13.5* 11.8* 15.6* 15.3* 13.9*  HGB 8.7* 8.8* 9.9* 8.9* 8.4*  HCT 23.8* 24.1* 28.1* 25.4* 24.3*  MCV 88.8 89.3 90.6 89.4 90.3  PLT 219 219 224 192 207   Cardiac Enzymes: No results for input(s): "CKTOTAL", "CKMB", "CKMBINDEX", "TROPONINI" in the last 168 hours. BNP: Invalid input(s): "POCBNP" CBG: No results for input(s): "GLUCAP" in the last 168 hours. D-Dimer No results for input(s): "DDIMER" in the last 72 hours. Hgb A1c No results for input(s): "HGBA1C" in the last 72 hours. Lipid Profile No results for input(s): "CHOL", "HDL", "LDLCALC", "TRIG", "CHOLHDL", "LDLDIRECT" in the last 72 hours. Thyroid function studies No results for input(s): "TSH", "T4TOTAL", "T3FREE", "THYROIDAB" in the last 72 hours.  Invalid input(s): "FREET3" Anemia work up No results for input(s): "VITAMINB12", "FOLATE", "FERRITIN", "TIBC", "IRON", "RETICCTPCT" in the last 72 hours. Urinalysis    Component Value Date/Time   COLORURINE YELLOW 09/01/2022 [place] 09/01/2022 1755   LABSPEC 1.011 09/01/2022 1755   PHURINE 5.0 09/01/2022 1755   GLUCOSEU NEGATIVE 09/01/2022 1755   HGBUR NEGATIVE 09/01/2022 1755   BILIRUBINUR NEGATIVE 09/01/2022 1755   KETONESUR  NEGATIVE 09/01/2022 1755   PROTEINUR NEGATIVE 09/01/2022 1755   UROBILINOGEN 1.0 02/13/2010 1843   NITRITE NEGATIVE 09/01/2022 1755   LEUKOCYTESUR NEGATIVE 09/01/2022 1755   Sepsis Labs Recent Labs  Lab 09/05/22 0258 09/06/22 0248 09/07/22 0212 09/08/22 0341  WBC 16.6* 21.5* 20.9* 18.6*   Microbiology Recent Results (from the past 240 hour(s))  Resp panel by RT-PCR (RSV, Flu A&B, Covid) Anterior Nasal Swab     Status: None   Collection Time: 09/01/22  1:49 PM   Specimen: Anterior Nasal Swab  Result Value Ref Range Status   SARS Coronavirus 2 by RT PCR NEGATIVE NEGATIVE Final    Comment: (NOTE) SARS-CoV-2 target nucleic acids are NOT DETECTED.  The SARS-CoV-2 RNA is generally detectable in upper respiratory specimens during the acute phase of infection. The lowest concentration of SARS-CoV-2 viral copies this assay can detect is 138 copies/mL. A negative result does not preclude SARS-Cov-2 infection and should not be used as the sole basis for treatment or other patient management decisions. A negative result may occur with  improper specimen collection/handling, submission of specimen other than nasopharyngeal swab, presence of viral mutation(s) within the areas targeted by this assay, and inadequate number of viral copies(<138 copies/mL). A negative result must be  combined with clinical observations, patient history, and epidemiological information. The expected result is Negative.  Fact Sheet for Patients:  EntrepreneurPulse.com.au  Fact Sheet for Healthcare Providers:  IncredibleEmployment.be  This test is no t yet approved or cleared by the [place] FDA and  has been authorized for detection and/or diagnosis of SARS-CoV-2 by FDA under an Emergency Use Authorization (EUA). This EUA will remain  in effect (meaning this test can be used) for the duration of the COVID-19 declaration under Section 564(b)(1) of the Act,  21 U.S.C.section 360bbb-3(b)(1), unless the authorization is terminated  or revoked sooner.       Influenza A by PCR NEGATIVE NEGATIVE Final   Influenza B by PCR NEGATIVE NEGATIVE Final    Comment: (NOTE) The Xpert Xpress SARS-CoV-2/FLU/RSV plus assay is intended as an aid in the diagnosis of influenza from Nasopharyngeal swab specimens and should not be used as a sole basis for treatment. Nasal washings and aspirates are unacceptable for Xpert Xpress SARS-CoV-2/FLU/RSV testing.  Fact Sheet for Patients: EntrepreneurPulse.com.au  Fact Sheet for Healthcare Providers: IncredibleEmployment.be  This test is not yet approved or cleared by the [place] FDA and has been authorized for detection and/or diagnosis of SARS-CoV-2 by FDA under an Emergency Use Authorization (EUA). This EUA will remain in effect (meaning this test can be used) for the duration of the COVID-19 declaration under Section 564(b)(1) of the Act, 21 U.S.C. section 360bbb-3(b)(1), unless the authorization is terminated or revoked.     Resp Syncytial Virus by PCR NEGATIVE NEGATIVE Final    Comment: (NOTE) Fact Sheet for Patients: EntrepreneurPulse.com.au  Fact Sheet for Healthcare Providers: IncredibleEmployment.be  This test is not yet approved or cleared by the [place] FDA and has been authorized for detection and/or diagnosis of SARS-CoV-2 by FDA under an Emergency Use Authorization (EUA). This EUA will remain in effect (meaning this test can be used) for the duration of the COVID-19 declaration under Section 564(b)(1) of the Act, 21 U.S.C. section 360bbb-3(b)(1), unless the authorization is terminated or revoked.  Performed at [facility], [place] [address], [place], [place] [address]   Culture, body fluid w Gram Stain-bottle     Status: None (Preliminary result)   Collection Time: 09/05/22  4:10 PM   Specimen: Fluid   Result Value Ref Range Status   Specimen Description FLUID PERITONEAL  Final   Special Requests BOTTLES DRAWN AEROBIC AND ANAEROBIC  Final   Culture   Final    NO GROWTH 3 DAYS Performed at [facility], [place] [address] [place], [place] [address]    Report Status PENDING  Incomplete  Gram stain     Status: None   Collection Time: 09/05/22  4:10 PM   Specimen: Fluid  Result Value Ref Range Status   Specimen Description FLUID PERITONEAL  Final   Special Requests NONE  Final   Gram Stain   Final    WBC PRESENT, PREDOMINANTLY PMN NO ORGANISMS SEEN CYTOSPIN SMEAR Performed at [facility], [place] [address], [place], [place] [address]    Report Status 09/05/2022 FINAL  Final     Time coordinating discharge: 35 minutes  SIGNED:   Aline August, MD  [facility] 09/08/2022, 7:36 AM

## 2022-09-08 NOTE — Plan of Care (Signed)
  Problem: Education: Goal: Knowledge of General Education information will improve Description: Including pain rating scale, medication(s)/side effects and non-pharmacologic comfort measures 09/08/2022 0359 by Suzy Bouchard, RN Outcome: Progressing 09/08/2022 0358 by Suzy Bouchard, RN Outcome: Progressing   Problem: Activity: Goal: Risk for activity intolerance will decrease 09/08/2022 0359 by Suzy Bouchard, RN Outcome: Progressing 09/08/2022 0358 by Suzy Bouchard, RN Outcome: Progressing   Problem: Coping: Goal: Level of anxiety will decrease 09/08/2022 0359 by Suzy Bouchard, RN Outcome: Progressing 09/08/2022 0358 by Suzy Bouchard, RN Outcome: Progressing   Problem: Pain Managment: Goal: General experience of comfort will improve 09/08/2022 0359 by Suzy Bouchard, RN Outcome: Progressing 09/08/2022 0358 by Suzy Bouchard, RN Outcome: Progressing   Problem: Safety: Goal: Ability to remain free from injury will improve 09/08/2022 0359 by Suzy Bouchard, RN Outcome: Progressing 09/08/2022 0358 by Suzy Bouchard, RN Outcome: Progressing

## 2022-09-08 NOTE — Progress Notes (Signed)
Mobility Specialist - Progress Note   09/08/22 1100  Mobility  Activity Ambulated independently in hallway  Level of Assistance Modified independent, requires aide device or extra time  Assistive Device [facility] Ambulated (ft) 550 ft  Activity Response Tolerated well  $Mobility charge 1 Mobility    Pt received sitting EOB agreeable to mobility. No complaints throughout. Left sitting EOB w/ call bell within reach and all needs met.   [place] Specialist Please contact via SecureChat or Rehab office at [phone]

## 2022-09-08 NOTE — Progress Notes (Signed)
[facility] to be D/C'd  per MD order.  Discussed with the patient and all questions fully answered.  VSS, Skin clean, dry and intact without evidence of skin break down, no evidence of skin tears noted.  IV catheter discontinued intact. Site without signs and symptoms of complications. Dressing and pressure applied.  An After Visit Summary was printed and given to the patient.   D/c education completed with patient/family including follow up instructions, medication list, d/c activities limitations if indicated, with other d/c instructions as indicated by MD - patient able to verbalize understanding, all questions fully answered.   Patient instructed to return to ED, call 911, or call MD for any changes in condition.   Patient to be escorted via [place], and D/C home via private auto.

## 2022-09-09 DIAGNOSIS — C220 Liver cell carcinoma: Secondary | ICD-10-CM | POA: Insufficient documentation

## 2022-09-09 LAB — CYTOLOGY - NON PAP

## 2022-09-09 NOTE — Progress Notes (Unsigned)
   Established Patient Office Visit  Subjective   Patient ID: Calvin [name], male    DOB: 1960/09/27  Age: 62 y.o. MRN: [id number]  No chief complaint on file.   HPI  {History (Optional):23778}  ROS    Objective:     There were no vitals taken for this visit. {Vitals History (Optional):23777}  Physical Exam   No results found for any visits on 09/10/22.  {Labs (Optional):23779}  The ASCVD Risk score (Arnett DK, et al., 2019) failed to calculate for the following reasons:   Cannot find a previous HDL lab   Cannot find a previous total cholesterol lab   Unable to determine if patient is Non-Hispanic African American    Assessment & Plan:   Problem List Items Addressed This Visit       Oncology   Hepatocellular carcinoma ([place]) - Primary    No follow-ups on file.    Truitt Merle, MD

## 2022-09-09 NOTE — Assessment & Plan Note (Signed)
-  Bilobar liver masses, with diffuse adenopathy and probable peritoneal metastasis  -Diagnosed on 09/04/2022 through liver biopsy, which showed moderate to poorly differentiated hepatocellular carcinoma -His staging CT scan showed diffuse adenopathy, and probable peritoneal metastasis.  I reviewed with patient and his brother in detail today. -I discussed metastatic [place] treatment, including immunotherapy, EGFR inhibitor, and oral TKI's.He is open to treatment.  -given his rapid progression of his cancer and high disease burden, I recommend first line therapy with Atezolizumab and bevacizumab, potential side effects, especially autoimmune related pneumonitis, hepatitis, pancreatitis, colitis, thyroid and other endocrine disorders, skin rash, arthralgia, hypertension, bleeding, thrombosis, bowel perforation, etc. were discussed with him in detail.  He agrees to proceed. -The goal of therapy is palliative, to prolong his life. 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  I do not feel strongly he needs repeat EGD now, but will monitor in future.

## 2022-09-10 ENCOUNTER — Inpatient Hospital Stay: Payer: Medicare Other | Attending: Hematology | Admitting: Hematology

## 2022-09-10 ENCOUNTER — Other Ambulatory Visit: Payer: Self-pay

## 2022-09-10 ENCOUNTER — Encounter: Payer: Self-pay | Admitting: Hematology

## 2022-09-10 VITALS — BP 140/82 | HR 122 | Temp 98.4°F | Resp 18 | Ht 67.0 in | Wt 167.2 lb

## 2022-09-10 DIAGNOSIS — D649 Anemia, unspecified: Secondary | ICD-10-CM | POA: Diagnosis not present

## 2022-09-10 DIAGNOSIS — K7460 Unspecified cirrhosis of liver: Secondary | ICD-10-CM | POA: Diagnosis not present

## 2022-09-10 DIAGNOSIS — Z66 Do not resuscitate: Secondary | ICD-10-CM | POA: Diagnosis not present

## 2022-09-10 DIAGNOSIS — I700 Atherosclerosis of aorta: Secondary | ICD-10-CM | POA: Diagnosis not present

## 2022-09-10 DIAGNOSIS — C220 Liver cell carcinoma: Secondary | ICD-10-CM | POA: Diagnosis not present

## 2022-09-10 DIAGNOSIS — I10 Essential (primary) hypertension: Secondary | ICD-10-CM | POA: Insufficient documentation

## 2022-09-10 DIAGNOSIS — C786 Secondary malignant neoplasm of retroperitoneum and peritoneum: Secondary | ICD-10-CM | POA: Insufficient documentation

## 2022-09-10 DIAGNOSIS — M109 Gout, unspecified: Secondary | ICD-10-CM | POA: Diagnosis not present

## 2022-09-10 DIAGNOSIS — F17210 Nicotine dependence, cigarettes, uncomplicated: Secondary | ICD-10-CM | POA: Insufficient documentation

## 2022-09-10 DIAGNOSIS — G893 Neoplasm related pain (acute) (chronic): Secondary | ICD-10-CM | POA: Diagnosis not present

## 2022-09-10 DIAGNOSIS — K5900 Constipation, unspecified: Secondary | ICD-10-CM | POA: Diagnosis not present

## 2022-09-10 DIAGNOSIS — Z09 Encounter for follow-up examination after completed treatment for conditions other than malignant neoplasm: Secondary | ICD-10-CM | POA: Diagnosis not present

## 2022-09-10 DIAGNOSIS — M25511 Pain in right shoulder: Secondary | ICD-10-CM | POA: Diagnosis not present

## 2022-09-10 DIAGNOSIS — B1920 Unspecified viral hepatitis C without hepatic coma: Secondary | ICD-10-CM | POA: Diagnosis not present

## 2022-09-10 DIAGNOSIS — E440 Moderate protein-calorie malnutrition: Secondary | ICD-10-CM | POA: Diagnosis not present

## 2022-09-10 DIAGNOSIS — Z7189 Other specified counseling: Secondary | ICD-10-CM | POA: Diagnosis not present

## 2022-09-10 DIAGNOSIS — E876 Hypokalemia: Secondary | ICD-10-CM | POA: Diagnosis not present

## 2022-09-10 DIAGNOSIS — M25552 Pain in left hip: Secondary | ICD-10-CM | POA: Diagnosis not present

## 2022-09-10 LAB — CULTURE, BODY FLUID W GRAM STAIN -BOTTLE: Culture: NO GROWTH

## 2022-09-10 NOTE — Progress Notes (Signed)
I met with Calvin [name] and his brother after  his hospital follow up appt with Dr Burr Medico.  I explained my role as a nurse navigator and provided my contact information. I explained the services provided at [facility] and provided written information.  I explained the alight grant and let  him know one of the financial advisors will reach out to  him at the time of his chemo education class. I told him that he will be scheduled for chemotherapy education class prior to receiving chemotherapy. I encouraged him to have his brother attend class with him.  I told him our schedulers will call him with those appts.  All questions were answered. They verbalized understanding.

## 2022-09-10 NOTE — Assessment & Plan Note (Signed)
-  We discussed the incurable nature of his cancer, and the overall poor prognosis, especially if he does not have good response to chemotherapy or progress on chemo -The patient understands the goal of care is palliative. -I recommend DNR/DNI, he agreed today

## 2022-09-10 NOTE — Progress Notes (Signed)
Calvin [name]   [phone]) [phone] [phone]   Clinic Follow up Note   Patient Care Team: Kristen Loader, FNP as PCP - General (Family Medicine) Jeanella Anton, NP as Nurse Practitioner (Nurse Practitioner) Truitt Merle, MD as Consulting Physician (Oncology)  Date of Service:  09/10/2022  CHIEF COMPLAINT: f/u of Liver Cancer  CURRENT THERAPY:  Pending  ASSESSMENT:  Calvin [name] is a 61 y.o. male with   Hepatocellular carcinoma ([place]) -Bilobar liver masses, with diffuse adenopathy and probable peritoneal metastasis  -Diagnosed on 09/04/2022 through liver biopsy, which showed moderate to poorly differentiated hepatocellular carcinoma -His staging CT scan showed diffuse adenopathy, and probable peritoneal metastasis.  I reviewed with patient and his brother in detail today. -I discussed metastatic [place] treatment, including immunotherapy, EGFR inhibitor, and oral TKI's.He is open to treatment.  -given his rapid progression of his cancer and high disease burden, I recommend first line therapy with Atezolizumab and bevacizumab, potential side effects, especially autoimmune related pneumonitis, hepatitis, pancreatitis, colitis, thyroid and other endocrine disorders, skin rash, arthralgia, hypertension, bleeding, thrombosis, bowel perforation, etc. were discussed with him in detail.  He agrees to proceed. -The goal of therapy is palliative, to prolong his life. 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  I do not feel strongly he needs repeat EGD now, but will monitor in future.  Goals of care, counseling/discussion -We discussed the incurable nature of his cancer, and the overall poor prognosis, especially if he does not have good response to chemotherapy or progress on chemo -The patient understands the goal of care is palliative. -I recommend DNR/DNI, he agreed today    Hepatitis C, liver cirrhosis -Continue follow-up with  GI    PLAN: -Discuss liver biopsy and CT Scan findings  - Referral Palliative care- [facility] for symptom management  -chemo class, will start first-line chemotherapy at tislelizumab and bevacizumab next week -lab and f/u in 2 weeks     SUMMARY OF ONCOLOGIC HISTORY: Oncology History  Hepatocellular carcinoma ([place])  09/09/2022 Initial Diagnosis   Hepatocellular carcinoma ([place])   09/17/2022 -  Chemotherapy   Patient is on Treatment Plan : LUNG Atezolizumab + Bevacizumab Maintenance q21d        INTERVAL HISTORY:  Calvin [name] is here for a follow up of liver cancer. He was last seen by me in the hospital He presents to the clinic accompanied by brother. Pt report of back and and stomach pain. His stomach pain is about a 6. He states that he drinks water, boots. The pt has some energy to do his ADL's     All other systems were reviewed with the patient and are negative.  MEDICAL HISTORY:  Past Medical History:  Diagnosis Date   Ascites    Chronic left hip pain    Cirrhosis ([place])    Hypertension    Stroke [facility])    TIA (transient ischemic attack) 06/23/2016    SURGICAL HISTORY: Past Surgical History:  Procedure Laterality Date   BACK SURGERY     CERVICAL SPINE SURGERY     ESOPHAGEAL BANDING N/A 01/25/2021   Procedure: ESOPHAGEAL BANDING;  Surgeon: Otis Brace, MD;  Location: [facility] ENDOSCOPY;  Service: Gastroenterology;  Laterality: N/A;   ESOPHAGOGASTRODUODENOSCOPY (EGD) WITH PROPOFOL N/A 01/25/2021   Procedure: ESOPHAGOGASTRODUODENOSCOPY (EGD) WITH PROPOFOL;  Surgeon: Otis Brace, MD;  Location: [facility] ENDOS[facility];  Service: Gastroenterology;  Laterality: N/A;   ESOPHAGOGASTRODUODENOSCOPY (EGD) WITH PROPOFOL N/A 05/21/2021   Procedure: ESOPHAGOGASTRODUODENOSCOPY (EGD) WITH  PROPOFOL;  Surgeon: Otis Brace, MD;  Location: [facility];  Service: Gastroenterology;  Laterality: N/A;   IR PARACENTESIS  09/05/2022   JOINT REPLACEMENT      I have reviewed the social  history and family history with the patient and they are unchanged from previous note.  ALLERGIES:  has No Known Allergies.  MEDICATIONS:  Current Outpatient Medications  Medication Sig Dispense Refill   allopurinol (ZYLOPRIM) 300 MG tablet Take 300 mg by mouth daily.     buprenorphine (BUTRANS) 7.5 MCG/HR 1 patch once a week.     ciprofloxacin (CIPRO) 500 MG tablet Take 1 tablet (500 mg total) by mouth 2 (two) times daily for 10 days. 20 tablet 0   diphenhydrAMINE (BENADRYL) 25 MG tablet Take 25 mg by mouth at bedtime as needed for itching or sleep.     folic acid (FOLVITE) 1 MG tablet Take 1 mg by mouth daily.     furosemide (LASIX) 20 MG tablet Take 20 mg by mouth daily.     gabapentin (NEURONTIN) 300 MG capsule Take 300 mg by mouth daily.     hydrocortisone cream 1 % Apply 1 application topically 3 (three) times daily as needed for itching.     lactulose (CHRONULAC) 10 GM/15ML solution Take 30 mLs (20 g total) by mouth 2 (two) times daily. 236 mL 0   omeprazole (PRILOSEC OTC) 20 MG tablet Take 2 tablets (40 mg total) by mouth daily. 180 tablet 3   Oxycodone HCl 10 MG TABS Take 10 mg by mouth in the morning, at noon, and at bedtime.     polyethylene glycol (MIRALAX / GLYCOLAX) 17 g packet Take 17 g by mouth daily as needed for mild constipation. 14 each 0   rifaximin (XIFAXAN) 550 MG TABS tablet Take 550 mg by mouth 2 (two) times daily.     senna (SENOKOT) 8.6 MG TABS tablet Take 2 tablets by mouth daily as needed for mild constipation.     spironolactone (ALDACTONE) 25 MG tablet Take 1 tablet (25 mg total) by mouth daily. 30 tablet 0   thiamine 100 MG tablet Take 1 tablet (100 mg total) by mouth daily. (Patient not taking: Reported on 09/03/2022)     tiZANidine (ZANAFLEX) 4 MG tablet Take 4 mg by mouth 3 (three) times daily as needed for muscle spasms.     No current facility-administered medications for this visit.    PHYSICAL EXAMINATION: ECOG PERFORMANCE STATUS: 2 - Symptomatic,  <50% confined to bed  Vitals:   09/10/22 1355  BP: (!) 140/82  Pulse: (!) 122  Resp: 18  Temp: 98.4 F (36.9 C)  SpO2: 99%   Wt Readings from Last 3 Encounters:  09/10/22 167 lb 3.2 oz (75.8 kg)  09/08/22 159 lb 9.8 oz (72.4 kg)  07/14/22 177 lb 12.8 oz (80.6 kg)    GENERAL:alert, no distress and comfortable SKIN: skin color, texture, turgor are normal, no rashes or significant lesions EYES: normal, Conjunctiva are pink and non-injected, sclera clear  NECK: supple, thyroid normal size, non-tender, without nodularity LYMPH: (-)  no palpable lymphadenopathy in the cervical, axillary  LUNGS:(-)  clear to auscultation and percussion with normal breathing effort HEART: regular rate & rhythm and no murmurs and no lower extremity edema ABDOMEN:(+)  abdomen soft, tender and normal bowel sounds, liver enlarge. Musculoskeletal:no cyanosis of digits and no clubbing  NEURO: alert & oriented x 3 with fluent speech, no focal motor/sensory deficits  LABORATORY DATA:  I have reviewed the data  as listed    Latest Ref Rng & Units 09/08/2022    3:41 AM 09/07/2022    2:12 AM 09/06/2022    2:48 AM  CBC  WBC 4.0 - 10.5 K/uL 18.6  20.9  21.5   Hemoglobin 13.0 - 17.0 g/dL 8.4  8.9  9.9   Hematocrit 39.0 - 52.0 % 24.3  25.4  28.1   Platelets 150 - 400 K/uL 207  192  224         Latest Ref Rng & Units 09/08/2022    3:41 AM 09/07/2022    2:12 AM 09/06/2022    2:48 AM  CMP  Glucose 70 - 99 mg/dL 92  94  96   BUN 8 - 23 mg/dL _0 Creatinine 0.61 - 1.24 mg/dL 0.97  1.02  1.18   Sodium 135 - 145 mmol/L 140  138  138   Potassium 3.5 - 5.1 mmol/L 3.4  4.1  3.9   Chloride 98 - 111 mmol/L 106  106  105   CO2 22 - 32 mmol/L _1 Calcium 8.9 - 10.3 mg/dL 11.0  10.7  10.6   Total Protein 6.5 - 8.1 g/dL 7.5  7.5  7.6   Total Bilirubin 0.3 - 1.2 mg/dL 3.7  3.1  2.5   Alkaline Phos 38 - 126 U/L 138  165  176   AST 15 - 41 U/L 40  39  55   ALT 0 - 44 U/L 29  36  45        RADIOGRAPHIC STUDIES: I have personally reviewed the radiological images as listed and agreed with the findings in the report. No results found.    Orders Placed This Encounter  Procedures   CBC with Differential ([place] Only)    Standing Status:   Future    Standing Expiration Date:   09/18/2023   CMP ([place] only)    Standing Status:   Future    Standing Expiration Date:   09/18/2023   T4    Standing Status:   Future    Standing Expiration Date:   09/18/2023   TSH    Standing Status:   Future    Standing Expiration Date:   09/18/2023   Total Protein, Urine dipstick    Standing Status:   Future    Standing Expiration Date:   09/18/2023   CBC with Differential ([place] Only)    Standing Status:   Future    Standing Expiration Date:   10/09/2023   CMP ([place] only)    Standing Status:   Future    Standing Expiration Date:   10/09/2023   Amb Referral to Palliative Care    Referral Priority:   Urgent    Referral Type:   Consultation    Number of Visits Requested:   1   All questions were answered. The patient knows to call the clinic with any problems, questions or concerns. No barriers to learning was detected. The total time spent in the appointment was 50 minutes.     Truitt Merle, MD 09/10/2022   Felicity Coyer, CMA, am acting as scribe for Truitt Merle, MD.   I have reviewed the above documentation for accuracy and completeness, and I agree with the above.

## 2022-09-10 NOTE — Progress Notes (Signed)
START OFF PATHWAY REGIMEN - Other   [id number]:Atezolizumab 1,200 mg IV D1 + Bevacizumab 15 mg/kg IV D1 q21 Days:   A cycle is every 21 days:     Atezolizumab      Bevacizumab-xxxx   **Always confirm dose/schedule in your pharmacy ordering system**  Patient Characteristics: Intent of Therapy: Non-Curative / Palliative Intent, Discussed with Patient

## 2022-09-11 ENCOUNTER — Other Ambulatory Visit: Payer: Self-pay

## 2022-09-11 ENCOUNTER — Telehealth: Payer: Self-pay | Admitting: Nurse Practitioner

## 2022-09-11 NOTE — Telephone Encounter (Signed)
Per 12/21 sch msg, pt has been called and confirmed

## 2022-09-12 ENCOUNTER — Inpatient Hospital Stay: Payer: Medicare Other | Admitting: Licensed Clinical Social Worker

## 2022-09-12 DIAGNOSIS — C220 Liver cell carcinoma: Secondary | ICD-10-CM

## 2022-09-12 NOTE — Progress Notes (Signed)
[facility]  Initial Assessment   Calvin [name] is a 61 y.o. year old male contacted by phone. Clinical Social Work was referred by medical provider for assessment of psychosocial needs.   SDOH (Social Determinants of Health) assessments performed: Yes   SDOH Screenings   Tobacco Use: High Risk (09/10/2022)     Distress Screen completed: No     No data to display            Family/Social Information:  Housing Arrangement: patient lives alone Family members/support persons in your life? Pt states his brother comes to stay with him occasionally and pt has a cousin and niece locally, but does not receive a great deal of support from family.  Pt was recently discharged from the hospital and [facility] was set up for additional support at home. Transportation concerns: Pt states he can find a ride to appointments in general, but may need transportation from time to time.  Pt to contact CSW if transportation becomes problematic.   Employment: Disabled Pt has chronic back issues and has been on disability since 2010.  Income source: Banker concerns: Yes, current concerns Type of concern: Utilities, Rent/ mortgage, and Transportation Food access concerns: Pt states his brother can get groceries for him or pt can go, but of late he has not been eating as he is not hungry. Religious or spiritual practice: Not known Services Currently in place:  home care through [place]  Coping/ Adjustment to diagnosis: Patient understands treatment plan and what happens next? yes Concerns about diagnosis and/or treatment: How I will pay for the services I need, How will I care for myself, and Quality of life Patient reported stressors: Finances, Transportation, and Adjusting to my illness Hopes and/or priorities: Pt's priority is to start treatment w/ the hope of positive results.  Pt states he is also experiencing a great deal of pain.  Seeing  palliative care is a priority for symptom management which is scheduled for 12/26. Patient enjoys  not addressed Current coping skills/ strengths: Motivation for treatment/growth     SUMMARY: Current SDOH Barriers:  Financial constraints related to fixed income, Limited social support, and Transportation  Clinical Social Work Clinical Goal(s):  Explore community resource options for unmet needs related to:  Curator Strain   Interventions: Discussed common feeling and emotions when being diagnosed with cancer, and the importance of support during treatment Informed patient of the support team roles and support services at [facility] Provided CSW contact information and encouraged patient to call with any questions or concerns Referred patient to Development worker, community and will meet w/ pt at first chemo infusion to assess in person as the phone connection was poor and pt was difficult to hear   Follow Up Plan: CSW will see patient on first infusion appt. Patient verbalizes understanding of plan: Yes    Henriette Combs, LCSW

## 2022-09-16 ENCOUNTER — Inpatient Hospital Stay (HOSPITAL_BASED_OUTPATIENT_CLINIC_OR_DEPARTMENT_OTHER): Payer: Medicare Other | Admitting: Nurse Practitioner

## 2022-09-16 ENCOUNTER — Encounter: Payer: Self-pay | Admitting: Nurse Practitioner

## 2022-09-16 ENCOUNTER — Other Ambulatory Visit: Payer: Self-pay

## 2022-09-16 VITALS — BP 92/61 | HR 80 | Temp 98.1°F | Resp 18

## 2022-09-16 DIAGNOSIS — K5900 Constipation, unspecified: Secondary | ICD-10-CM

## 2022-09-16 DIAGNOSIS — R530 Neoplastic (malignant) related fatigue: Secondary | ICD-10-CM | POA: Diagnosis not present

## 2022-09-16 DIAGNOSIS — Z7189 Other specified counseling: Secondary | ICD-10-CM

## 2022-09-16 DIAGNOSIS — C220 Liver cell carcinoma: Secondary | ICD-10-CM | POA: Diagnosis not present

## 2022-09-16 DIAGNOSIS — R531 Weakness: Secondary | ICD-10-CM

## 2022-09-16 DIAGNOSIS — Z515 Encounter for palliative care: Secondary | ICD-10-CM

## 2022-09-16 DIAGNOSIS — G893 Neoplasm related pain (acute) (chronic): Secondary | ICD-10-CM | POA: Diagnosis not present

## 2022-09-16 DIAGNOSIS — R630 Anorexia: Secondary | ICD-10-CM

## 2022-09-16 NOTE — Patient Instructions (Addendum)
-   continue to take lactulose 2 times a day for your liver and for constipation  - pick up and take oxycodone every 6 hours as needed for pain, which is 4 pills a day - come in tomorrow at 745 for your first treatment - we will get you set up with a thoracentesis (fluid drained)

## 2022-09-16 NOTE — Progress Notes (Unsigned)
[place]  [phone]) [phone] [phone]) [phone]   Name: Calvin [name] Date: 09/16/2022 MRN: [id number]  DOB: 07/03/1961  Patient Care Team: Kristen Loader, FNP as PCP - General (Family Medicine) Jeanella Anton, NP as Nurse Practitioner (Nurse Practitioner) Truitt Merle, MD as Consulting Physician (Oncology)    REASON FOR CONSULTATION: Calvin [name] is a 61 y.o. male with oncologic medical history including alcohol abuse, hepatitis C, liver cirrhosis, hypertension, and recently diagnosed hepatocellular carcinoma with peritoneal metastasis (09/04/22).  Palliative ask to see for symptom management and goals of care as well as advanced directives .    SOCIAL HISTORY:     reports that he has been smoking cigarettes. He has never used smokeless tobacco. He reports current alcohol use. He reports that he does not use drugs.  ADVANCE DIRECTIVES:  Reviewed advanced directives packet; referral placed for advanced directives clinic.   CODE STATUS: DNR  PAST MEDICAL HISTORY: Past Medical History:  Diagnosis Date   Ascites    Chronic left hip pain    Cirrhosis ([place])    Hypertension    Stroke [facility])    TIA (transient ischemic attack) 06/23/2016    PAST SURGICAL HISTORY:  Past Surgical History:  Procedure Laterality Date   BACK SURGERY     CERVICAL SPINE SURGERY     ESOPHAGEAL BANDING N/A 01/25/2021   Procedure: ESOPHAGEAL BANDING;  Surgeon: Otis Brace, MD;  Location: [facility];  Service: Gastroenterology;  Laterality: N/A;   ESOPHAGOGASTRODUODENOSCOPY (EGD) WITH PROPOFOL N/A 01/25/2021   Procedure: ESOPHAGOGASTRODUODENOSCOPY (EGD) WITH PROPOFOL;  Surgeon: Otis Brace, MD;  Location: [facility];  Service: Gastroenterology;  Laterality: N/A;   ESOPHAGOGASTRODUODENOSCOPY (EGD) WITH PROPOFOL N/A 05/21/2021   Procedure: ESOPHAGOGASTRODUODENOSCOPY (EGD) WITH PROPOFOL;  Surgeon: Otis Brace, MD;  Location: [facility]  ENDOSCOPY;  Service: Gastroenterology;  Laterality: N/A;   IR PARACENTESIS  09/05/2022   JOINT REPLACEMENT      HEMATOLOGY/ONCOLOGY HISTORY:  Oncology History  Hepatocellular carcinoma ([place])  09/09/2022 Initial Diagnosis   Hepatocellular carcinoma ([place])   09/17/2022 -  Chemotherapy   Patient is on Treatment Plan : LUNG Atezolizumab + Bevacizumab Maintenance q21d       ALLERGIES:  has No Known Allergies.  MEDICATIONS:  Current Outpatient Medications  Medication Sig Dispense Refill   allopurinol (ZYLOPRIM) 300 MG tablet Take 300 mg by mouth daily.     buprenorphine (BUTRANS) 7.5 MCG/HR 1 patch once a week.     ciprofloxacin (CIPRO) 500 MG tablet Take 1 tablet (500 mg total) by mouth 2 (two) times daily for 10 days. 20 tablet 0   diphenhydrAMINE (BENADRYL) 25 MG tablet Take 25 mg by mouth at bedtime as needed for itching or sleep.     folic acid (FOLVITE) 1 MG tablet Take 1 mg by mouth daily.     furosemide (LASIX) 20 MG tablet Take 20 mg by mouth daily.     gabapentin (NEURONTIN) 300 MG capsule Take 300 mg by mouth daily.     hydrocortisone cream 1 % Apply 1 application topically 3 (three) times daily as needed for itching.     lactulose (CHRONULAC) 10 GM/15ML solution Take 30 mLs (20 g total) by mouth 2 (two) times daily. 236 mL 0   omeprazole (PRILOSEC OTC) 20 MG tablet Take 2 tablets (40 mg total) by mouth daily. 180 tablet 3   Oxycodone HCl 10 MG TABS Take 10 mg by mouth in the morning, at noon, and at bedtime.  polyethylene glycol (MIRALAX / GLYCOLAX) 17 g packet Take 17 g by mouth daily as needed for mild constipation. 14 each 0   rifaximin (XIFAXAN) 550 MG TABS tablet Take 550 mg by mouth 2 (two) times daily.     senna (SENOKOT) 8.6 MG TABS tablet Take 2 tablets by mouth daily as needed for mild constipation.     spironolactone (ALDACTONE) 25 MG tablet Take 1 tablet (25 mg total) by mouth daily. 30 tablet 0   thiamine 100 MG tablet Take 1 tablet (100 mg total) by mouth  daily. (Patient not taking: Reported on 09/03/2022)     tiZANidine (ZANAFLEX) 4 MG tablet Take 4 mg by mouth 3 (three) times daily as needed for muscle spasms.     No current facility-administered medications for this visit.    VITAL SIGNS: BP 92/61 (BP Location: Right Arm, Patient Position: Sitting)   Pulse 80   Temp 98.1 F (36.7 C) (Oral)   Resp 18   SpO2 100%  There were no vitals filed for this visit.  Estimated body mass index is 26.19 kg/m as calculated from the following:   Height as of 09/10/22: '5\' 7"'$  (1.702 m).   Weight as of 09/10/22: 167 lb 3.2 oz (75.8 kg).  LABS: CBC:    Component Value Date/Time   WBC 18.6 (H) 09/08/2022 0341   HGB 8.4 (L) 09/08/2022 0341   HCT 24.3 (L) 09/08/2022 0341   PLT 207 09/08/2022 0341   MCV 90.3 09/08/2022 0341   NEUTROABS 13.9 (H) 09/08/2022 0341   LYMPHSABS 2.2 09/08/2022 0341   MONOABS 2.1 (H) 09/08/2022 0341   EOSABS 0.2 09/08/2022 0341   BASOSABS 0.1 09/08/2022 0341   Comprehensive Metabolic Panel:    Component Value Date/Time   NA 140 09/08/2022 0341   K 3.4 (L) 09/08/2022 0341   CL 106 09/08/2022 0341   CO2 23 09/08/2022 0341   BUN 14 09/08/2022 0341   CREATININE 0.97 09/08/2022 0341   GLUCOSE 92 09/08/2022 0341   CALCIUM 11.0 (H) 09/08/2022 0341   AST 40 09/08/2022 0341   ALT 29 09/08/2022 0341   ALKPHOS 138 (H) 09/08/2022 0341   BILITOT 3.7 (H) 09/08/2022 0341   PROT 7.5 09/08/2022 0341   ALBUMIN 4.2 09/08/2022 0341    RADIOGRAPHIC STUDIES: CT CHEST WO CONTRAST  Result Date: 09/06/2022 CLINICAL DATA:  61 year old male with history of hepatocellular carcinoma. Staging examination. * Tracking Code: BO * EXAM: CT CHEST WITHOUT CONTRAST TECHNIQUE: Multidetector CT imaging of the chest was performed following the standard protocol without IV contrast. RADIATION DOSE REDUCTION: This exam was performed according to the departmental dose-optimization program which includes automated exposure control, adjustment of the  mA and/or kV according to patient size and/or use of iterative reconstruction technique. COMPARISON:  Chest CTA 02/15/2018. FINDINGS: Cardiovascular: Heart size is normal. There is no significant pericardial fluid, thickening or pericardial calcification. There is aortic atherosclerosis, as well as atherosclerosis of the great vessels of the mediastinum and the coronary arteries, including calcified atherosclerotic plaque in the left main, left anterior descending, left circumflex and right coronary arteries. Mediastinum/Nodes: Multiple enlarged mediastinal lymph nodes are noted throughout the right paratracheal nodal station, largest of which measure up to 2.5 cm in short axis (axial image 42 of series 3). No definite hilar lymphadenopathy confidently identified on today's noncontrast CT examination. Possible left subpectoral lymph node (axial image 16 of series 3) measuring 1.8 cm in short axis, although this may alternatively represent a dilated venous structure (poorly  evaluated on today's noncontrast CT examination). Esophagus is unremarkable in appearance. No axillary lymphadenopathy. Lungs/Pleura: Trace bilateral pleural effusions (right greater than left) lying dependently. Ground-glass attenuation nodule in the left upper lobe (axial image 50 of series 5) measuring 1.7 x 1.5 cm. Other solid-appearing pulmonary nodules are also noted, largest of which is in the medial aspect of the left upper lobe (axial image 66 of series 4) measuring 8 x 7 mm. No confluent consolidative airspace disease. Mild diffuse bronchial wall thickening with mild centrilobular and paraseptal emphysema. Upper Abdomen: Cirrhotic liver with large mass centered in the right lobe of the liver, poorly demonstrated on today's noncontrast CT examination. Extensive lymphadenopathy in the upper abdomen partially imaged. Small volume of ascites. Musculoskeletal: There are no aggressive appearing lytic or blastic lesions noted in the visualized  portions of the skeleton. IMPRESSION: 1. Extensive lymphadenopathy in the mediastinum, and potentially in the left subpectoral region, highly concerning for metastatic disease in the thorax. Further evaluation with PET-CT should be considered if clinically appropriate. 2. Pulmonary nodules, most notably a solid-appearing pulmonary nodule in the left upper lobe (axial image 66 of series 4) measuring 8 x 7 mm. These are nonspecific, but the possibility of metastatic disease to the lungs should be considered. 3. Trace bilateral (right-greater-than-left) pleural effusions. 4. Cirrhotic liver with large mass and extensive upper abdominal lymphadenopathy and presumably malignant ascites, poorly demonstrated on today's noncontrast CT examination. Please see report from recently obtained abdominal MRI 09/03/2022 for full description. 5. Mild diffuse bronchial wall thickening with mild centrilobular and paraseptal emphysema; imaging findings suggestive of underlying COPD. 6. Aortic atherosclerosis, in addition to left main and three-vessel coronary artery disease. Please note that although the presence of coronary artery calcium documents the presence of coronary artery disease, the severity of this disease and any potential stenosis cannot be assessed on this non-gated CT examination. Assessment for potential risk factor modification, dietary therapy or pharmacologic therapy may be warranted, if clinically indicated. Aortic Atherosclerosis (ICD10-I70.0) and Emphysema (ICD10-J43.9). Electronically Signed   By: Vinnie Langton M.D.   On: 09/06/2022 08:39    MR LIVER WO CONRTAST  Result Date: 09/03/2022 CLINICAL DATA:  Suspected liver lesions in a 61 year old male. No contrast was administered for this exam. Only limited imaging was acquired at patient request. Patient asked to discontinue the exam. EXAM: MRI ABDOMEN WITHOUT CONTRAST TECHNIQUE: Multiplanar multisequence MR imaging was performed without the  administration of intravenous contrast. COMPARISON:  CT of the abdomen and pelvis performed on September 01, 2022. FINDINGS: Lower chest: Incidental imaging of the lower chest is unremarkable to the extent evaluated aside from small bilateral pleural effusions, very limited assessment on this abdominal MRI. Hepatobiliary: Nodular surface contour the liver compatible with cirrhotic morphology though assessment limited due to the markedly abnormal appearance of the liver which is filled with tumor. Dominant lesion in the RIGHT hepatic lobe 8.6 x 6.7 cm (image 17/4) tumor displays bilobar involvement and is greatest on the RIGHT. Next largest hepatic tumor as a discrete lesion that can be visualized in the RIGHT hepatic lobe is in the range of 6.3 cm. Tumors ranging between 2.5 and 2.8 cm in the lateral segment of the LEFT hepatic lobe (image 21/4. Portal vein cannot be assessed but may be involved with tumor and or displaced. Area most suspicious for portal vein involvement seen on image 20/4 with some expansion and intermediate signal that is contiguous with celiac adenopathy. No gross biliary duct dilation. Edematous appearing gallbladder with cholelithiasis remains  nonspecific in the setting of generalized edema throughout the abdomen. Pancreas: Not well assessed. Generalized edema throughout the abdomen. Masses which are contiguous with the pancreas but potentially represent lymph nodes, for instance on image 30/4 an area measuring 3.3 cm is more likely a necrotic lymph node but cannot be well evaluated. No ductal dilation of the pancreas. Spleen:  Spleen is top-normal size. Adrenals/Urinary Tract: No hydronephrosis. Large RIGHT adrenal mass is suspected along with tumor implant adjacent to the RIGHT adrenal and RIGHT kidney. Tumor implant on image 19/42.6 cm. RIGHT adrenal inseparable from medial RIGHT hepatic lobe mass. LEFT adrenal gland is normal. Stomach/Bowel: Extensive bowel edema of uncertain significance  in this patient with suspected liver disease. Bowel not well assessed. Vascular/Lymphatic: Normal caliber of the abdominal aorta. Potential portal vein invasion from tumor. Adenopathy in the retroperitoneum and upper abdomen. Other: Small volume ascites and diffuse mesenteric and body wall edema, suspect this is slightly increased compared to previous imaging. Musculoskeletal: No gross abnormalities, limited assessment. IMPRESSION: 1. Findings of diffuse hepatic tumor worse in the RIGHT hepatic lobe, bilobar involvement with extensive adenopathy in the upper abdomen. 2. Question of portal vein involvement though this areas not well assessed. Study is highly limited due to motion and incomplete acquisition of even sequences that would constitute a noncontrast abdominal MRI. No contrast was given in the study was terminated early at patient request. 3. Findings raise the question of primary hepatic neoplasm with metastatic disease in the abdomen. Tumor marker correlation may be helpful. In the event the tumor markers are equivocal tissue sampling may be helpful. 4. Developing anasarca. Electronically Signed   By: Zetta Bills M.D.   On: 09/03/2022 17:49         PERFORMANCE STATUS (ECOG) : 2 - Symptomatic, <50% confined to bed  Review of Systems  Constitutional:  Positive for activity change and fatigue.  Gastrointestinal:  Positive for abdominal pain.  Musculoskeletal:  Positive for arthralgias and back pain.  Unless otherwise noted, a complete review of systems is negative.  Physical Exam General: Weak, NAD, in wheelchair  Cardiovascular: regular rate and rhythm Pulmonary: clear ant fields Abdomen: tight, distended, nontender, + bowel sounds Extremities: no edema, no joint deformities Skin: no rashes Neurological: AAO x3, mood appropriate  IMPRESSION: This is my initial visit with Calvin [name]. He presents to clinic today with his brother Joneen Boers. No acute distress noted. Patient is in a  wheelchair. Weak appearing. Alert and able to engage in discussions. Speech somewhat slurred which he states is his baseline.   I introduced myself, [place] RN, and Palliative's role in collaboration with the oncology team. Concept of Palliative Care was introduced as specialized medical care for people and their families living with serious illness.  It focuses on providing relief from the symptoms and stress of a serious illness.  The goal is to improve quality of life for both the patient and the family. Values and goals of care important to patient and family were attempted to be elicited.   Calvin [name] lives in the home alone. He has 3 children and 5 grandchildren. 4 siblings. His 2 brothers are involved in his care. Originally from [place]. He worked in the [employer]. Christian faith.   He has a Marine scientist Aide that comes in 7 days a week 5x/day for 4 hours offering assistance with ADLs. He is ambulatory in the home with a walker. Endorses weakness and gait instability. His brother states he does have frequent falls and is  becoming concerned with him staying by himself however patient insist. Appetite is fair. He reports gained weight at 167lbs up from 159lbs. Drinks 2 Boost daily. Does feel full quicker when eating certain foods.   Neoplasm related pain Calvin [name] is endorsing ongoing pain in his lower back, left side, abdomen, and lower extremities. He was previously being managed at [facility]. Reports he has been on Oxycodone for more than 13 years due to chronic arthritis and hip pain. Feels his pain has worsened since recent cancer diagnosis.   He describes his pain as a constant ache, stabbing, throbbing. Nothing improves his pain other than pain medication. We discussed use of medication and safety. He and brother verbalized understanding.   Detailed chart review conducted including PDMP showing patient has been on Buprenorphine 7.5 mg patch weekly since March 2023 and  Oxycodone IR 10 mg with last prescription given on 08/21/22 qty of 120 (mos supply) per Dr. Jeanella Anton [facility]). Patient was hospitalized 12/11-12/18.    I had open and direct discussion with patient regarding his pain management. I inquired about previous medication, alcohol, and illicit drug use. Calvin [name] confirms history of crack cocaine use with last date of use in 2020. He shares his mother found out and he quit as she was passing away and could not bare the pain of knowing he was lying to her. History of alcohol use. Last drink was 3-4 weeks ago. He was drinking a pint of gin daily and occasional beers. Reports he has not used after being told he had cancer and knowing this would hasten his death due to liver involvement.   We discussed at length pain management and protocol for receiving medications by our team with emphasis on abstinence of illicit drug use, receiving medications/prescriptions by multiple providers, and responsible use of medications. He verbalized understanding. Pain contract reviewed at length. Patient willingly signed and verbalized his agreement to proceed with ongoing pain management.   Constipation Endorses occasional constipation however is supposed to be taking lactulose twice daily. He has not consistently been taking as directed. Will plan to restart. Reports when taking correctly does not have concerns for constipation.   Goals of Care We discussed his current illness and what it means in the larger context of his on-going co-morbidities. Natural disease trajectory and expectations were discussed.  Calvin [name] and his brother are inquiring about his stage of cancer and being "cured". I advised that additional discussions with Dr. Burr Medico would be appropriate however his cancer is not curable and all treatments and interventions are palliative intent per documentation. Extensive education provided on his cancer and how his performance status and response  will play a major role in prognosis and outlook. Patient and brother verbalized understanding.   I created space and opportunity for [name] and his brother to express feelings.  He is remaining hopeful for some stability and ability to tolerate treatment if an option.  He does understand at some point his condition will be terminal and he will be facing end-of-life.  We discussed advanced directives and CODE STATUS.  I confirm with patient that his wishes are for DNR/DNI.  Both patient and brother confirmed he would not want any form of heroic measures with a desire for natural death.  He does not have a completed advanced directive however is very interested in completing while he is able to do so.  Advanced directive packet provided to patient and brother.  We reviewed in detail and patient  has completed documents per his wishes with understanding he will receive a call to schedule an appointment to finalize documents.  He has identified his brother Keng Jewel as his primary medical decision-maker and his younger brother Kasandra Knudsen as a Nutritional therapist in the event Joneen Boers is unable to be present.  He would not want any heroic measures.  His wishes is for his medical decision makers to follow his advanced directives as documented.  Education provided on MOLST form.  As requested document was completed on today. The patient and family outlined their wishes for the following treatment decisions:  Cardiopulmonary Resuscitation: Do Not Attempt Resuscitation (DNR/No CPR)  Medical Interventions: Limited Additional Interventions: Use medical treatment, IV fluids and cardiac monitoring as indicated, DO NOT USE intubation or mechanical ventilation. May consider use of less invasive airway support such as BiPAP or CPAP. Also provide comfort measures. Transfer to the hospital if indicated. Avoid intensive care.   Antibiotics: Determine use of limitation of antibiotics when infection occurs  IV Fluids: IV  fluids for a defined trial period  Feeding Tube: Feeding tube for a defined trial period     I discussed the importance of continued conversation with family and their medical providers regarding overall plan of care and treatment options, ensuring decisions are within the context of the patients values and GOCs.  PLAN: Established therapeutic relationship. Education provided on palliative's role in collaboration with their Oncology team. DNR/DNI confirmed.  Out of facility form completed and patient provided with original. MOLST form completed (see above).  Original copy given to patient. Continue to take lactulose 2 times a day to help with constipation Oxycodone '10mg'$  every 6 hours as needed for pain. Prescription received for 30 day supply on 11/30 (hospitalized 12/11-12/18).  Butran 7.5 patch filled 11/30 for 30 day supply.  Mirlalax daily for bowel regimen Education provided on advanced directive.  Patient has completed documents with his brother.  Understands he will receive a call to finalize documents.  Referral to advanced directives clinic. I will plan to see patient back in 2-4 weeks in collaboration to other oncology appointments.    Patient expressed understanding and was in agreement with this plan. He also understands that He can call the clinic at any time with any questions, concerns, or complaints.   Thank you for your referral and allowing Palliative to assist in Calvin [name]'s care.   Number and complexity of problems addressed: HIGH - 1 or more chronic illnesses with SEVERE exacerbation, progression, or side effects of treatment - advanced cancer, pain. Any controlled substances utilized were prescribed in the context of palliative care.  Time Total: 50 min   Visit consisted of counseling and education dealing with the complex and emotionally intense issues of symptom management and palliative care in the setting of serious and potentially life-threatening  illness.Greater than 50%  of this time was spent counseling and coordinating care related to the above assessment and plan.  Signed by: Alda Lea, AGPCNP-BC Palliative Medicine Team/ [place]

## 2022-09-17 ENCOUNTER — Inpatient Hospital Stay: Payer: Medicare Other

## 2022-09-17 ENCOUNTER — Inpatient Hospital Stay: Payer: Medicare Other | Admitting: Dietician

## 2022-09-17 ENCOUNTER — Other Ambulatory Visit: Payer: Self-pay | Admitting: Hematology

## 2022-09-17 ENCOUNTER — Telehealth: Payer: Self-pay | Admitting: Nurse Practitioner

## 2022-09-17 ENCOUNTER — Encounter: Payer: Self-pay | Admitting: Hematology

## 2022-09-17 NOTE — Progress Notes (Deleted)
Nutrition Assessment   Reason for Assessment: SW Referral (poor appetite)   ASSESSMENT: 61 year old male with newly diagnosed hepatocellular carcinoma with peritoneal metastasis. Patient receiving atezolizumab + bevacizumab q21d. He is under the care of Dr. Burr Medico.  Past medical history includes HTN, cirrhosis, hep C, alcohol abuse, hyponatremia, HLD, ascites, anemia of chronic disease, SBP, right eye blindness, TIA  Nutrition Focused Physical Exam:   Orbital Region: *** Buccal Region: *** Upper Arm Region: Copy and Lumbar Region: *** Temple Region: *** Clavicle Bone Region: *** Shoulder and Acromion Bone Region: *** Scapular Bone Region: *** Dorsal Hand: *** Patellar Region: *** Anterior Thigh Region: *** Posterior Calf Region: *** Edema (RD assessment): *** Hair: *** Eyes: *** Mouth: *** Skin: *** Nails: ***   Medications: folic acid, lasix '20mg'$ , gabapentin, lactulose, prilosec, oxycodone, miralax, rifaximin, senna, thiamine, zanaflex, zyloprim, butrans   Labs: ***   Anthropometrics:   Height: 5'7" Weight: *** UBW: *** BMI: ***   Estimated Energy Needs  Kcals: *** Protein: *** Fluid: ***   NUTRITION DIAGNOSIS: ***   MALNUTRITION DIAGNOSIS: ***   INTERVENTION: ***   MONITORING, EVALUATION, GOAL: ***   Next Visit: ***  ***

## 2022-09-17 NOTE — Telephone Encounter (Signed)
[name] left a voicemail requesting a return phone call. I returned call to patient. His brother Calvin [name] was also at the home with him and involved in the discussions.   I reminded Calvin [name] he was supposed to be here today for his initial chemotherapy treatment. We discussed this at length on yesterday during his visit and his brother Calvin [name]) confirmed. Patient unfortunately did not show today. Patient and brother, Calvin [name] stated he did not have a ride and they were trying to make some arrangements. Advised he would receive a call to reschedule appointment and we would see if transportation could be arranged in the future. He verbalized understanding.   Patient expressed frustration of his prescription for Oxycodone was not sent to his pharmacy. Brother states he went by yesterday afternoon and again this morning. I discussed at length with patient he received a prescription from Dr. Donovan Kail on 11/30 for a 30 day supply. He was hospitalized from 12/11-12/18 which would mean he should have at least 12 days worth of medications. Patient upset stating when he got home from the hospital his medications were missing. He states he has not taken because he has been out. I acknowledged his frustration however he is not going to be able to pick up a new prescription as the pharmacy is aware of his admission and patient should not be out of his prescription. He confirms he has his Butran patch on. Advised he would be able to pick up a new prescription on 12/30 for a month supply. Patient upset stating "I don't need a prescription for the patch I want my pain pills". I asked patient who would have had access to his home and medications during his hospitalization. He stated no one. Insist he was taking his medication while in the hospital. I educated patient that he would not have used his home medication in the hosptial unless he was taken without medical staff knowledge otherwise medications were provided by the hospital  pharmacist. I also inquired that he mentioned he could not find his medications when he got out of the hospital that he did not know who "moved " them or "took" them.  I reminded patient of pain contract and importance of proper storage and use of medication. He verbalized understanding however still with frustration that he cannot pick up medications today. I again offered apologies for his situation and advised that he speak with his family about medication location. Patient cursed and brother was in background. Hung up the phone.

## 2022-09-17 NOTE — Progress Notes (Signed)
Emailed sent to [employer] requesting patient be enrolled in our transportation services.

## 2022-09-18 ENCOUNTER — Telehealth: Payer: Self-pay | Admitting: Hematology

## 2022-09-18 NOTE — Telephone Encounter (Signed)
Per 12/27 IB, brother has been called and confirmed

## 2022-09-19 DIAGNOSIS — G629 Polyneuropathy, unspecified: Secondary | ICD-10-CM | POA: Diagnosis not present

## 2022-09-19 DIAGNOSIS — G8929 Other chronic pain: Secondary | ICD-10-CM | POA: Diagnosis not present

## 2022-09-19 DIAGNOSIS — R030 Elevated blood-pressure reading, without diagnosis of hypertension: Secondary | ICD-10-CM | POA: Diagnosis not present

## 2022-09-19 DIAGNOSIS — Z79899 Other long term (current) drug therapy: Secondary | ICD-10-CM | POA: Diagnosis not present

## 2022-09-19 DIAGNOSIS — M5416 Radiculopathy, lumbar region: Secondary | ICD-10-CM | POA: Diagnosis not present

## 2022-09-19 DIAGNOSIS — F17210 Nicotine dependence, cigarettes, uncomplicated: Secondary | ICD-10-CM | POA: Diagnosis not present

## 2022-09-19 DIAGNOSIS — E559 Vitamin D deficiency, unspecified: Secondary | ICD-10-CM | POA: Diagnosis not present

## 2022-09-19 DIAGNOSIS — M25552 Pain in left hip: Secondary | ICD-10-CM | POA: Diagnosis not present

## 2022-09-19 DIAGNOSIS — K5909 Other constipation: Secondary | ICD-10-CM | POA: Diagnosis not present

## 2022-09-19 DIAGNOSIS — M109 Gout, unspecified: Secondary | ICD-10-CM | POA: Diagnosis not present

## 2022-09-24 ENCOUNTER — Inpatient Hospital Stay: Payer: Medicare Other | Attending: Hematology

## 2022-09-24 ENCOUNTER — Other Ambulatory Visit: Payer: Self-pay

## 2022-09-25 ENCOUNTER — Inpatient Hospital Stay: Payer: Medicare Other

## 2022-09-25 ENCOUNTER — Telehealth: Payer: Self-pay | Admitting: Hematology

## 2022-09-25 NOTE — Telephone Encounter (Signed)
Patient's brother called to r/s missed treatment appointments. R/s according to treatment plan. Forwarding information to transportation to accommodate patient.

## 2022-09-25 NOTE — Progress Notes (Signed)
Pharmacist Chemotherapy Monitoring - Initial Assessment    Anticipated start date: 10/02/22    The following has been reviewed per standard work regarding the patient's treatment regimen: The patient's diagnosis, treatment plan and drug doses, and organ/hematologic function Lab orders and baseline tests specific to treatment regimen  The treatment plan start date, drug sequencing, and pre-medications Prior authorization status  Patient's documented medication list, including drug-drug interaction screen and prescriptions for anti-emetics and supportive care specific to the treatment regimen The drug concentrations, fluid compatibility, administration routes, and timing of the medications to be used The patient's access for treatment and lifetime cumulative dose history, if applicable  The patient's medication allergies and previous infusion related reactions, if applicable   Changes made to treatment plan:  N/A  Follow up needed:  N/A   Kennith Center, Pharm.D., CPP 09/25/2022'@2'$ :38 PM

## 2022-09-26 ENCOUNTER — Inpatient Hospital Stay: Payer: Medicare Other | Admitting: Licensed Clinical Social Worker

## 2022-09-26 DIAGNOSIS — C220 Liver cell carcinoma: Secondary | ICD-10-CM

## 2022-09-26 NOTE — Progress Notes (Signed)
[place] CSW Progress Note  Clinical Education officer, museum  received a message to call pt's cousin Calvin [name] [phone]).  Ms Calvin [name] expressed concerns regarding pt's support.  Per Calvin [name] pt's brothers are not assisting pt with scheduling his appointments and driving him there in a timely fashion.  Calvin [name] expressed concern that pt will not get the treatment he needs due to inconsistent support.  Pt is scheduled to come to advanced directives clinic on 12/8 as well as an appointment w/ palliative care.  CSW informed Calvin [name] that advanced directives and the assignment of a Calvin [name] will be explained in detail at the clinic, but ultimately it is the patient's decision regarding who he wishes to assign.  Calvin [name] encouraged to speak with pt herself over the weekend regarding her concerns to ensure pt has considered all possible options prior to completing paperwork.  CSW to see pt 12/8 to complete advanced directives.        Calvin Combs, LCSW    Patient is participating in a Managed Medicaid Plan:  Yes

## 2022-09-29 ENCOUNTER — Inpatient Hospital Stay: Payer: Medicare Other

## 2022-09-29 ENCOUNTER — Encounter (HOSPITAL_COMMUNITY): Payer: Self-pay | Admitting: Radiology

## 2022-09-29 ENCOUNTER — Inpatient Hospital Stay (HOSPITAL_COMMUNITY)
Admission: EM | Admit: 2022-09-29 | Discharge: 2022-10-23 | DRG: 951 | Disposition: E | Payer: Medicare Other | Attending: Internal Medicine | Admitting: Internal Medicine

## 2022-09-29 ENCOUNTER — Inpatient Hospital Stay: Payer: Medicare Other | Admitting: Licensed Clinical Social Worker

## 2022-09-29 ENCOUNTER — Emergency Department (HOSPITAL_COMMUNITY): Payer: Medicare Other

## 2022-09-29 ENCOUNTER — Inpatient Hospital Stay: Payer: Medicare Other | Admitting: Nurse Practitioner

## 2022-09-29 DIAGNOSIS — R531 Weakness: Secondary | ICD-10-CM | POA: Diagnosis not present

## 2022-09-29 DIAGNOSIS — K7460 Unspecified cirrhosis of liver: Secondary | ICD-10-CM | POA: Diagnosis not present

## 2022-09-29 DIAGNOSIS — Z515 Encounter for palliative care: Principal | ICD-10-CM

## 2022-09-29 DIAGNOSIS — Z1152 Encounter for screening for COVID-19: Secondary | ICD-10-CM

## 2022-09-29 DIAGNOSIS — R6889 Other general symptoms and signs: Secondary | ICD-10-CM | POA: Diagnosis not present

## 2022-09-29 DIAGNOSIS — E872 Acidosis, unspecified: Secondary | ICD-10-CM | POA: Diagnosis not present

## 2022-09-29 DIAGNOSIS — H5461 Unqualified visual loss, right eye, normal vision left eye: Secondary | ICD-10-CM | POA: Diagnosis not present

## 2022-09-29 DIAGNOSIS — I10 Essential (primary) hypertension: Secondary | ICD-10-CM | POA: Diagnosis not present

## 2022-09-29 DIAGNOSIS — E860 Dehydration: Secondary | ICD-10-CM | POA: Diagnosis present

## 2022-09-29 DIAGNOSIS — R54 Age-related physical debility: Secondary | ICD-10-CM | POA: Diagnosis not present

## 2022-09-29 DIAGNOSIS — Z66 Do not resuscitate: Secondary | ICD-10-CM | POA: Diagnosis not present

## 2022-09-29 DIAGNOSIS — F17210 Nicotine dependence, cigarettes, uncomplicated: Secondary | ICD-10-CM | POA: Diagnosis present

## 2022-09-29 DIAGNOSIS — D638 Anemia in other chronic diseases classified elsewhere: Secondary | ICD-10-CM | POA: Diagnosis present

## 2022-09-29 DIAGNOSIS — N179 Acute kidney failure, unspecified: Secondary | ICD-10-CM | POA: Diagnosis not present

## 2022-09-29 DIAGNOSIS — D539 Nutritional anemia, unspecified: Secondary | ICD-10-CM | POA: Diagnosis present

## 2022-09-29 DIAGNOSIS — R6520 Severe sepsis without septic shock: Secondary | ICD-10-CM | POA: Diagnosis not present

## 2022-09-29 DIAGNOSIS — Z7189 Other specified counseling: Secondary | ICD-10-CM | POA: Diagnosis not present

## 2022-09-29 DIAGNOSIS — C220 Liver cell carcinoma: Secondary | ICD-10-CM | POA: Diagnosis not present

## 2022-09-29 DIAGNOSIS — Z8673 Personal history of transient ischemic attack (TIA), and cerebral infarction without residual deficits: Secondary | ICD-10-CM | POA: Diagnosis not present

## 2022-09-29 DIAGNOSIS — R58 Hemorrhage, not elsewhere classified: Secondary | ICD-10-CM | POA: Diagnosis not present

## 2022-09-29 DIAGNOSIS — Z743 Need for continuous supervision: Secondary | ICD-10-CM | POA: Diagnosis not present

## 2022-09-29 DIAGNOSIS — E785 Hyperlipidemia, unspecified: Secondary | ICD-10-CM | POA: Diagnosis not present

## 2022-09-29 DIAGNOSIS — E875 Hyperkalemia: Secondary | ICD-10-CM | POA: Diagnosis present

## 2022-09-29 DIAGNOSIS — A419 Sepsis, unspecified organism: Secondary | ICD-10-CM

## 2022-09-29 DIAGNOSIS — G9341 Metabolic encephalopathy: Secondary | ICD-10-CM | POA: Diagnosis not present

## 2022-09-29 DIAGNOSIS — B182 Chronic viral hepatitis C: Secondary | ICD-10-CM | POA: Diagnosis present

## 2022-09-29 DIAGNOSIS — R404 Transient alteration of awareness: Secondary | ICD-10-CM | POA: Diagnosis not present

## 2022-09-29 DIAGNOSIS — Z79899 Other long term (current) drug therapy: Secondary | ICD-10-CM | POA: Diagnosis not present

## 2022-09-29 DIAGNOSIS — R4182 Altered mental status, unspecified: Secondary | ICD-10-CM

## 2022-09-29 LAB — CBC WITH DIFFERENTIAL/PLATELET
Abs Immature Granulocytes: 0.94 10*3/uL — ABNORMAL HIGH (ref 0.00–0.07)
Basophils Absolute: 0.1 10*3/uL (ref 0.0–0.1)
Basophils Relative: 0 %
Eosinophils Absolute: 0 10*3/uL (ref 0.0–0.5)
Eosinophils Relative: 0 %
HCT: 31.8 % — ABNORMAL LOW (ref 39.0–52.0)
Hemoglobin: 9.9 g/dL — ABNORMAL LOW (ref 13.0–17.0)
Immature Granulocytes: 3 %
Lymphocytes Relative: 3 %
Lymphs Abs: 1.2 10*3/uL (ref 0.7–4.0)
MCH: 31.9 pg (ref 26.0–34.0)
MCHC: 31.1 g/dL (ref 30.0–36.0)
MCV: 102.6 fL — ABNORMAL HIGH (ref 80.0–100.0)
Monocytes Absolute: 2.8 10*3/uL — ABNORMAL HIGH (ref 0.1–1.0)
Monocytes Relative: 8 %
Neutro Abs: 29.9 10*3/uL — ABNORMAL HIGH (ref 1.7–7.7)
Neutrophils Relative %: 86 %
Platelets: 219 10*3/uL (ref 150–400)
RBC: 3.1 MIL/uL — ABNORMAL LOW (ref 4.22–5.81)
RDW: 28.6 % — ABNORMAL HIGH (ref 11.5–15.5)
WBC: 35 10*3/uL — ABNORMAL HIGH (ref 4.0–10.5)
nRBC: 12 % — ABNORMAL HIGH (ref 0.0–0.2)

## 2022-09-29 LAB — RESP PANEL BY RT-PCR (RSV, FLU A&B, COVID)  RVPGX2
Influenza A by PCR: POSITIVE — AB
Influenza B by PCR: NEGATIVE
Resp Syncytial Virus by PCR: NEGATIVE
SARS Coronavirus 2 by RT PCR: NEGATIVE

## 2022-09-29 LAB — ETHANOL: Alcohol, Ethyl (B): <10 mg/dL (ref <10)

## 2022-09-29 LAB — PROTIME-INR
INR: 2.2 — ABNORMAL HIGH (ref 0.8–1.2)
Prothrombin Time: 24.2 seconds — ABNORMAL HIGH (ref 11.4–15.2)

## 2022-09-29 LAB — COMPREHENSIVE METABOLIC PANEL
ALT: 137 U/L — ABNORMAL HIGH (ref 0–44)
AST: 212 U/L — ABNORMAL HIGH (ref 15–41)
Albumin: 2.4 g/dL — ABNORMAL LOW (ref 3.5–5.0)
Alkaline Phosphatase: 148 U/L — ABNORMAL HIGH (ref 38–126)
Anion gap: 16 — ABNORMAL HIGH (ref 5–15)
BUN: 115 mg/dL — ABNORMAL HIGH (ref 8–23)
CO2: 16 mmol/L — ABNORMAL LOW (ref 22–32)
Calcium: 11.3 mg/dL — ABNORMAL HIGH (ref 8.9–10.3)
Chloride: 111 mmol/L (ref 98–111)
Creatinine, Ser: 3.25 mg/dL — ABNORMAL HIGH (ref 0.61–1.24)
GFR, Estimated: 21 mL/min — ABNORMAL LOW (ref >60)
Glucose, Bld: 87 mg/dL (ref 70–99)
Potassium: 5.2 mmol/L — ABNORMAL HIGH (ref 3.5–5.1)
Sodium: 143 mmol/L (ref 135–145)
Total Bilirubin: 12.8 mg/dL — ABNORMAL HIGH (ref 0.3–1.2)
Total Protein: 8.2 g/dL — ABNORMAL HIGH (ref 6.5–8.1)

## 2022-09-29 LAB — LACTIC ACID, PLASMA: Lactic Acid, Venous: 6.4 mmol/L (ref 0.5–1.9)

## 2022-09-29 LAB — AMMONIA: Ammonia: 35 umol/L (ref 9–35)

## 2022-09-29 LAB — TROPONIN I (HIGH SENSITIVITY): Troponin I (High Sensitivity): 16 ng/L (ref <18)

## 2022-09-29 LAB — LIPASE, BLOOD: Lipase: 55 U/L — ABNORMAL HIGH (ref 11–51)

## 2022-09-29 MED ORDER — ACETAMINOPHEN 650 MG RE SUPP: 650.0000 mg | Freq: Four times a day (QID) | RECTAL | Status: DC | PRN

## 2022-09-29 MED ORDER — LORAZEPAM 1 MG PO TABS: 1.0000 mg | ORAL_TABLET | ORAL | Status: DC | PRN

## 2022-09-29 MED ORDER — HALOPERIDOL LACTATE 2 MG/ML PO CONC: 0.5000 mg | ORAL | Status: DC | PRN

## 2022-09-29 MED ORDER — ONDANSETRON HCL 4 MG/2ML IJ SOLN
4.0000 mg | Freq: Four times a day (QID) | INTRAMUSCULAR | Status: DC | PRN
  Administered 2022-09-29: 4 mg via INTRAVENOUS
  Filled 2022-09-29: qty 2

## 2022-09-29 MED ORDER — ONDANSETRON 4 MG PO TBDP: 4.0000 mg | ORAL_TABLET | Freq: Four times a day (QID) | ORAL | Status: DC | PRN

## 2022-09-29 MED ORDER — POLYVINYL ALCOHOL 1.4 % OP SOLN: 1.0000 [drp] | Freq: Four times a day (QID) | OPHTHALMIC | Status: DC | PRN

## 2022-09-29 MED ORDER — LORAZEPAM 2 MG/ML IJ SOLN
1.0000 mg | INTRAMUSCULAR | Status: DC | PRN
  Administered 2022-09-29: 1 mg via INTRAVENOUS
  Filled 2022-09-29: qty 1

## 2022-09-29 MED ORDER — SODIUM CHLORIDE 0.9 % IV SOLN
2.0000 g | Freq: Once | INTRAVENOUS | Status: AC
  Administered 2022-09-29: 2 g via INTRAVENOUS
  Filled 2022-09-29: qty 12.5

## 2022-09-29 MED ORDER — GLYCOPYRROLATE 0.2 MG/ML IJ SOLN: 0.2000 mg | INTRAMUSCULAR | Status: DC | PRN

## 2022-09-29 MED ORDER — LORAZEPAM 2 MG/ML PO CONC: 1.0000 mg | ORAL | Status: DC | PRN

## 2022-09-29 MED ORDER — ACETAMINOPHEN 325 MG PO TABS: 650.0000 mg | ORAL_TABLET | Freq: Four times a day (QID) | ORAL | Status: DC | PRN

## 2022-09-29 MED ORDER — METRONIDAZOLE 500 MG/100ML IV SOLN
500.0000 mg | Freq: Once | INTRAVENOUS | Status: DC
  Administered 2022-09-29: 500 mg via INTRAVENOUS
  Filled 2022-09-29: qty 100

## 2022-09-29 MED ORDER — GLYCOPYRROLATE 1 MG PO TABS: 1.0000 mg | ORAL_TABLET | ORAL | Status: DC | PRN

## 2022-09-29 MED ORDER — HYDROMORPHONE HCL 1 MG/ML IJ SOLN
0.5000 mg | INTRAMUSCULAR | Status: DC | PRN
  Administered 2022-09-29 – 2022-09-30 (×3): 0.5 mg via INTRAVENOUS
  Filled 2022-09-29: qty 0.5
  Filled 2022-09-29: qty 1
  Filled 2022-09-29: qty 0.5

## 2022-09-29 MED ORDER — HALOPERIDOL 0.5 MG PO TABS: 0.5000 mg | ORAL_TABLET | ORAL | Status: DC | PRN

## 2022-09-29 MED ORDER — OXYCODONE HCL 20 MG/ML PO CONC: 5.0000 mg | ORAL | Status: DC | PRN

## 2022-09-29 MED ORDER — BIOTENE DRY MOUTH MT LIQD: 15.0000 mL | OROMUCOSAL | Status: DC | PRN

## 2022-09-29 MED ORDER — SODIUM CHLORIDE 0.9 % IV BOLUS
1000.0000 mL | Freq: Once | INTRAVENOUS | Status: AC
  Administered 2022-09-29: 1000 mL via INTRAVENOUS

## 2022-09-29 MED ORDER — HALOPERIDOL LACTATE 5 MG/ML IJ SOLN: 0.5000 mg | INTRAMUSCULAR | Status: DC | PRN

## 2022-09-29 MED ORDER — SODIUM CHLORIDE 0.9 % IV SOLN: Freq: Once | INTRAVENOUS | Status: AC

## 2022-09-29 MED ORDER — VANCOMYCIN HCL 1500 MG/300ML IV SOLN
1500.0000 mg | Freq: Once | INTRAVENOUS | Status: DC
  Filled 2022-09-29: qty 300

## 2022-09-29 NOTE — H&P (Signed)
History and Physical    Patient: Calvin [name] VZD:[id number] DOB: Oct 21, 1960 DOA: 09/30/2022 DOS: the patient was seen and examined on 09/30/2022 PCP: Kristen Loader, FNP  Patient coming from: Home  Chief Complaint:  Chief Complaint  Patient presents with   Altered Mental Status   HPI: Calvin [name] is a 62 y.o. male with medical history significant of HCC, Hep C, HTN, HLD. Presenting with altered mental status. History is from brother Kasandra Knudsen) by phone. He says the patient is chronically ill but was in his normal state of health until this morning. He tried to get the patient up so that he could get to a oncology appointment, but the patient was really weak and confused. The patient could respond, but his voice was even weak. He almost sounded like he was speaking with a wheeze. He became concerned and called for EMS.  Review of Systems: unable to review all systems due to the inability of the patient to answer questions. Past Medical History:  Diagnosis Date   Ascites    Chronic left hip pain    Cirrhosis ([place])    Hypertension    Stroke [facility])    TIA (transient ischemic attack) 06/23/2016   Past Surgical History:  Procedure Laterality Date   BACK SURGERY     CERVICAL SPINE SURGERY     ESOPHAGEAL BANDING N/A 01/25/2021   Procedure: ESOPHAGEAL BANDING;  Surgeon: Otis Brace, MD;  Location: [facility];  Service: Gastroenterology;  Laterality: N/A;   ESOPHAGOGASTRODUODENOSCOPY (EGD) WITH PROPOFOL N/A 01/25/2021   Procedure: ESOPHAGOGASTRODUODENOSCOPY (EGD) WITH PROPOFOL;  Surgeon: Otis Brace, MD;  Location: [facility];  Service: Gastroenterology;  Laterality: N/A;   ESOPHAGOGASTRODUODENOSCOPY (EGD) WITH PROPOFOL N/A 05/21/2021   Procedure: ESOPHAGOGASTRODUODENOSCOPY (EGD) WITH PROPOFOL;  Surgeon: Otis Brace, MD;  Location: [facility];  Service: Gastroenterology;  Laterality: N/A;   IR PARACENTESIS  09/05/2022   JOINT REPLACEMENT     Social History:  reports  that he has been smoking cigarettes. He has never used smokeless tobacco. He reports current alcohol use. He reports that he does not use drugs.  No Known Allergies  Family History  Family history unknown: Yes    Prior to Admission medications   Medication Sig Start Date End Date Taking? Authorizing Provider  allopurinol (ZYLOPRIM) 300 MG tablet Take 300 mg by mouth daily. 10/22/20   [provider]  buprenorphine (BUTRANS) 7.5 MCG/HR 1 patch once a week. 06/21/22   [provider]  diphenhydrAMINE (BENADRYL) 25 MG tablet Take 25 mg by mouth at bedtime as needed for itching or sleep.    [provider]  folic acid (FOLVITE) 1 MG tablet Take 1 mg by mouth daily. 02/14/20   [provider]  furosemide (LASIX) 20 MG tablet Take 20 mg by mouth daily. 02/14/20   [provider]  gabapentin (NEURONTIN) 300 MG capsule Take 300 mg by mouth daily.    [provider]  hydrocortisone cream 1 % Apply 1 application topically 3 (three) times daily as needed for itching.    [provider]  lactulose (CHRONULAC) 10 GM/15ML solution Take 30 mLs (20 g total) by mouth 2 (two) times daily. 04/07/20   Elgergawy, Silver Huguenin, MD  omeprazole (PRILOSEC OTC) 20 MG tablet Take 2 tablets (40 mg total) by mouth daily. 05/21/21 09/03/22  Otis Brace, MD  Oxycodone HCl 10 MG TABS Take 10 mg by mouth in the morning, at noon, and at bedtime.    [provider]  polyethylene glycol (MIRALAX / GLYCOLAX) 17 g packet Take 17 g by mouth daily as needed for mild constipation. 09/08/22   Aline August, MD  rifaximin (XIFAXAN) 550 MG TABS tablet Take 550 mg by mouth 2 (two) times daily.    [provider]  senna (SENOKOT) 8.6 MG TABS tablet Take 2 tablets by mouth daily as needed for mild constipation.    [provider]  spironolactone (ALDACTONE) 25 MG tablet Take 1 tablet (25 mg total) by mouth daily. 09/08/22   Aline August, MD  thiamine  100 MG tablet Take 1 tablet (100 mg total) by mouth daily. Patient not taking: Reported on 09/03/2022 04/08/20   Elgergawy, Silver Huguenin, MD  tiZANidine (ZANAFLEX) 4 MG tablet Take 4 mg by mouth 3 (three) times daily as needed for muscle spasms. 11/17/20   [provider]    Physical Exam: Vitals:   09/26/2022 1100 09/28/2022 1130 09/30/2022 1200 10/15/2022 1300  BP: 131/86 132/88 138/87 124/82  Pulse: (!) 109 (!) 109 (!) 110 (!) 104  Resp: (!) 23 20 (!) 24 (!) 21  SpO2: 98% 99% 98% 98%   General: 62 y.o. male resting in bed in NAD Eyes: cteric sclera ENMT: Nares patent w/o discharge, orophaynx clear, dentition poor, ears w/o discharge/lesions/ulcers Neck: thin, trachea midline Cardiovascular: tachy, +S1, S2, no m/g/r, equal pulses throughout Respiratory: decreased at bases, no w/r/r, normal WOB GI: BS hypoactive, distended, NT,soft MSK: No e/c/c Neuro: A&O x name, no focal deficits  Data Reviewed:  Results for orders placed or performed during the hospital encounter of 09/22/2022 (from the past 24 hour(s))  Ethanol     Status: None   Collection Time: 10/10/2022 11:34 AM  Result Value Ref Range   Alcohol, Ethyl (B) <10 <10 mg/dL  Lactic acid, plasma     Status: Abnormal   Collection Time: 10/20/2022 11:35 AM  Result Value Ref Range   Lactic Acid, Venous 6.4 (HH) 0.5 - 1.9 mmol/L  Comprehensive metabolic panel     Status: Abnormal   Collection Time: 10/14/2022 11:35 AM  Result Value Ref Range   Sodium 143 135 - 145 mmol/L   Potassium 5.2 (H) 3.5 - 5.1 mmol/L   Chloride 111 98 - 111 mmol/L   CO2 16 (L) 22 - 32 mmol/L   Glucose, Bld 87 70 - 99 mg/dL   BUN 115 (H) 8 - 23 mg/dL   Creatinine, Ser 3.25 (H) 0.61 - 1.24 mg/dL   Calcium 11.3 (H) 8.9 - 10.3 mg/dL   Total Protein 8.2 (H) 6.5 - 8.1 g/dL   Albumin 2.4 (L) 3.5 - 5.0 g/dL   AST 212 (H) 15 - 41 U/L   ALT 137 (H) 0 - 44 U/L   Alkaline Phosphatase 148 (H) 38 - 126 U/L   Total Bilirubin 12.8 (H) 0.3 - 1.2 mg/dL   GFR, Estimated 21  (L) >60 mL/min   Anion gap 16 (H) 5 - 15  Troponin I (High Sensitivity)     Status: None   Collection Time: 09/25/2022 11:35 AM  Result Value Ref Range   Troponin I (High Sensitivity) 16 <18 ng/L  CBC with Differential     Status: Abnormal   Collection Time: 10/09/2022 11:35 AM  Result Value Ref Range   WBC 35.0 (H) 4.0 - 10.5 K/uL   RBC 3.10 (L) 4.22 - 5.81 MIL/uL   Hemoglobin 9.9 (L) 13.0 - 17.0 g/dL   HCT 31.8 (L) 39.0 - 52.0 %   MCV 102.6 (H)  80.0 - 100.0 fL   MCH 31.9 26.0 - 34.0 pg   MCHC 31.1 30.0 - 36.0 g/dL   RDW 28.6 (H) 11.5 - 15.5 %   Platelets 219 150 - 400 K/uL   nRBC 12.0 (H) 0.0 - 0.2 %   Neutrophils Relative % 86 %   Neutro Abs 29.9 (H) 1.7 - 7.7 K/uL   Lymphocytes Relative 3 %   Lymphs Abs 1.2 0.7 - 4.0 K/uL   Monocytes Relative 8 %   Monocytes Absolute 2.8 (H) 0.1 - 1.0 K/uL   Eosinophils Relative 0 %   Eosinophils Absolute 0.0 0.0 - 0.5 K/uL   Basophils Relative 0 %   Basophils Absolute 0.1 0.0 - 0.1 K/uL   Immature Granulocytes 3 %   Abs Immature Granulocytes 0.94 (H) 0.00 - 0.07 K/uL   Polychromasia PRESENT    Target Cells PRESENT   Lipase, blood     Status: Abnormal   Collection Time: 09/30/2022 11:35 AM  Result Value Ref Range   Lipase 55 (H) 11 - 51 U/L  Protime-INR     Status: Abnormal   Collection Time: 10/06/2022 11:35 AM  Result Value Ref Range   Prothrombin Time 24.2 (H) 11.4 - 15.2 seconds   INR 2.2 (H) 0.8 - 1.2  Ammonia     Status: None   Collection Time: 10/08/2022 11:35 AM  Result Value Ref Range   Ammonia 35 9 - 35 umol/L   CXR: There are no signs of pulmonary edema or focal pulmonary consolidation There is prominence of right paratracheal soft tissues suggesting lymphadenopathy in mediastinum.  CTH: 1. No acute intracranial abnormality. Stable mild atrophy.   Assessment and Plan: AKI Sepsis from unknown source Acute metabolic encephalopathy     - admit to inpt, med-surg     - discussion w/ brother; reports that patient just wants  to be at home -- this is echo'd by the EDP in his conversation w/ the patient     - brother agrees that comfort care measures are what the patient wants; he would preferrably want the patient to come home, but if residential hospice is what's appropriate, he is ok with that as well      - make DNR, comfort care     - can finish his last bag of fluids     - stop abx, aggressive treatment     - End of Life order set started     - may have pleasure feeds  Hepatocellular Carcinoma Macrocytic anemia HLD HTN Hx of TIA/CVA Hep C Hyperkalemia     - family has elevated comfort care measures; will hold further treatments  Advance Care Planning:   Code Status: DNR/CC  Consults: Palliative Care, Heme-Onc  Family Communication: w/ brother by phone  Severity of Illness: The appropriate patient status for this patient is INPATIENT. Inpatient status is judged to be reasonable and necessary in order to provide the required intensity of service to ensure the patient's safety. The patient's presenting symptoms, physical exam findings, and initial radiographic and laboratory data in the context of their chronic comorbidities is felt to place them at high risk for further clinical deterioration. Furthermore, it is not anticipated that the patient will be medically stable for discharge from the hospital within 2 midnights of admission.   * I certify that at the point of admission it is my clinical judgment that the patient will require inpatient hospital care spanning beyond 2 midnights from the point of admission due  to high intensity of service, high risk for further deterioration and high frequency of surveillance required.*  Author: Jonnie Finner, DO 09/28/2022 1:37 PM  For on call review [email or web address].

## 2022-09-29 NOTE — ED Notes (Signed)
Patient actively vomiting and complaining of pain.  Continues to state "just let me die, just let me die."  Patient medicated with PRN medications to promote comfort.  Repositioned in bed and provided with additional pillows and warm blankets.  Will continue to monitor

## 2022-09-29 NOTE — ED Notes (Signed)
Provider made aware of patient's temperature.  Verbal order received to place warm blankets at this time

## 2022-09-29 NOTE — Progress Notes (Addendum)
Calvin [name]   DOB:[birth year]   HY#:[id number]   EXB#:[id number]  Oncology follow up   Subjective: Patient is known to me, under my care for his recently diagnosed liver cancer.  He was scheduled to start systemic treatment this week.  He presented to emergency room with sudden onset confusion, weakness, since this morning.  Per his brother, he was at his normal status last night.  No fever or chills.   Objective:  Vitals:   10/20/2022 1530 09/26/2022 1600  BP: (!) 126/91 (!) 121/100  Pulse: (!) 105 (!) 124  Resp: 15 17  SpO2: 97% 100%    There is no height or weight on file to calculate BMI. No intake or output data in the 24 hours ending 10/17/2022 1654   Sclerae icteric  Oropharynx clear  No peripheral adenopathy  Lungs clear -- no rales or rhonchi  Heart regular rate and rhythm  Abdomen distended, with diffuse tenderness in the upper abdomen.  MSK no focal spinal tenderness, no peripheral edema  Neuro nonfocal, patient is confused and disorientated.  CBG (last 3)  No results for input(s): "GLUCAP" in the last 72 hours.   Labs:    Urine Studies No results for input(s): "UHGB", "CRYS" in the last 72 hours.  Invalid input(s): "UACOL", "UAPR", "USPG", "UPH", "UTP", "UGL", "UKET", "UBIL", "UNIT", "UROB", "ULEU", "UEPI", "UWBC", "URBC", "UBAC", "CAST", "UCOM", "BILUA"  Basic Metabolic Panel: Recent Labs  Lab 10/11/2022 1135  NA 143  K 5.2*  CL 111  CO2 16*  GLUCOSE 87  BUN 115*  CREATININE 3.25*  CALCIUM 11.3*   GFR CrCl cannot be calculated (Unknown ideal weight.). Liver Function Tests: Recent Labs  Lab 10/05/2022 1135  AST 212*  ALT 137*  ALKPHOS 148*  BILITOT 12.8*  PROT 8.2*  ALBUMIN 2.4*   Recent Labs  Lab 10/10/2022 1135  LIPASE 55*   Recent Labs  Lab 10/13/2022 1135  AMMONIA 35   Coagulation profile Recent Labs  Lab 10/21/2022 1135  INR 2.2*    CBC: Recent Labs  Lab 10/04/2022 1135  WBC 35.0*  NEUTROABS 29.9*  HGB 9.9*  HCT 31.8*  MCV 102.6*   PLT 219   Cardiac Enzymes: No results for input(s): "CKTOTAL", "CKMB", "CKMBINDEX", "TROPONINI" in the last 168 hours. BNP: Invalid input(s): "POCBNP" CBG: No results for input(s): "GLUCAP" in the last 168 hours. D-Dimer No results for input(s): "DDIMER" in the last 72 hours. Hgb A1c No results for input(s): "HGBA1C" in the last 72 hours. Lipid Profile No results for input(s): "CHOL", "HDL", "LDLCALC", "TRIG", "CHOLHDL", "LDLDIRECT" in the last 72 hours. Thyroid function studies No results for input(s): "TSH", "T4TOTAL", "T3FREE", "THYROIDAB" in the last 72 hours.  Invalid input(s): "FREET3" Anemia work up No results for input(s): "VITAMINB12", "FOLATE", "FERRITIN", "TIBC", "IRON", "RETICCTPCT" in the last 72 hours. Microbiology Recent Results (from the past 240 hour(s))  Resp panel by RT-PCR (RSV, Flu A&B, Covid) Anterior Nasal Swab     Status: Abnormal   Collection Time: 10/11/2022  1:00 PM   Specimen: Anterior Nasal Swab  Result Value Ref Range Status   SARS Coronavirus 2 by RT PCR NEGATIVE NEGATIVE Final    Comment: (NOTE) SARS-CoV-2 target nucleic acids are NOT DETECTED.  The SARS-CoV-2 RNA is generally detectable in upper respiratory specimens during the acute phase of infection. The lowest concentration of SARS-CoV-2 viral copies this assay can detect is 138 copies/mL. A negative result does not preclude SARS-Cov-2 infection and should not be used as the sole basis  for treatment or other patient management decisions. A negative result may occur with  improper specimen collection/handling, submission of specimen other than nasopharyngeal swab, presence of viral mutation(s) within the areas targeted by this assay, and inadequate number of viral copies(<138 copies/mL). A negative result must be combined with clinical observations, patient history, and epidemiological information. The expected result is Negative.  Fact Sheet for Patients:   EntrepreneurPulse.com.au  Fact Sheet for Healthcare Providers:  IncredibleEmployment.be  This test is no t yet approved or cleared by the [place] FDA and  has been authorized for detection and/or diagnosis of SARS-CoV-2 by FDA under an Emergency Use Authorization (EUA). This EUA will remain  in effect (meaning this test can be used) for the duration of the COVID-19 declaration under Section 564(b)(1) of the Act, 21 U.S.C.section 360bbb-3(b)(1), unless the authorization is terminated  or revoked sooner.       Influenza A by PCR POSITIVE (A) NEGATIVE Final   Influenza B by PCR NEGATIVE NEGATIVE Final    Comment: (NOTE) The Xpert Xpress SARS-CoV-2/FLU/RSV plus assay is intended as an aid in the diagnosis of influenza from Nasopharyngeal swab specimens and should not be used as a sole basis for treatment. Nasal washings and aspirates are unacceptable for Xpert Xpress SARS-CoV-2/FLU/RSV testing.  Fact Sheet for Patients: EntrepreneurPulse.com.au  Fact Sheet for Healthcare Providers: IncredibleEmployment.be  This test is not yet approved or cleared by the [place] FDA and has been authorized for detection and/or diagnosis of SARS-CoV-2 by FDA under an Emergency Use Authorization (EUA). This EUA will remain in effect (meaning this test can be used) for the duration of the COVID-19 declaration under Section 564(b)(1) of the Act, 21 U.S.C. section 360bbb-3(b)(1), unless the authorization is terminated or revoked.     Resp Syncytial Virus by PCR NEGATIVE NEGATIVE Final    Comment: (NOTE) Fact Sheet for Patients: EntrepreneurPulse.com.au  Fact Sheet for Healthcare Providers: IncredibleEmployment.be  This test is not yet approved or cleared by the [place] FDA and has been authorized for detection and/or diagnosis of SARS-CoV-2 by FDA under an Emergency Use  Authorization (EUA). This EUA will remain in effect (meaning this test can be used) for the duration of the COVID-19 declaration under Section 564(b)(1) of the Act, 21 U.S.C. section 360bbb-3(b)(1), unless the authorization is terminated or revoked.  Performed at [facility], [place] [address], [place], [place] [address]       Studies:  CT Head Wo Contrast  Result Date: 10/02/2022 CLINICAL DATA:  Altered mental status. EXAM: CT HEAD WITHOUT CONTRAST TECHNIQUE: Contiguous axial images were obtained from the base of the skull through the vertex without intravenous contrast. RADIATION DOSE REDUCTION: This exam was performed according to the departmental dose-optimization program which includes automated exposure control, adjustment of the mA and/or kV according to patient size and/or use of iterative reconstruction technique. COMPARISON:  CT head and MRI brain dated September 01, 2022. FINDINGS: Brain: No evidence of acute infarction, hemorrhage, hydrocephalus, extra-axial collection or mass lesion/mass effect. Stable mild atrophy. Vascular: Atherosclerotic vascular calcification of the carotid siphons. No hyperdense vessel. Skull: Normal. Negative for fracture or focal lesion. Sinuses/Orbits: No acute finding. Old right lamina papyracea fracture again noted. Other: None. IMPRESSION: 1. No acute intracranial abnormality. Stable mild atrophy. Electronically Signed   By: Titus Dubin M.D.   On: 10/04/2022 12:26   DG Chest Port 1 View  Result Date: 09/24/2022 CLINICAL DATA:  Altered mental status EXAM: PORTABLE CHEST 1 VIEW COMPARISON:  Previous  studies including the chest radiograph done on 09/01/2022 and CT chest done on 09/06/2022 FINDINGS: Cardiac size is within normal limits. There is prominence of right paratracheal soft tissues suggesting possible lymphadenopathy. Lung fields are clear of any infiltrates or pulmonary edema. There is poor inspiration. There is no pleural effusion  or pneumothorax. Deformity in the lateral end of right clavicle may suggest old injury. IMPRESSION: There are no signs of pulmonary edema or focal pulmonary consolidation There is prominence of right paratracheal soft tissues suggesting lymphadenopathy in mediastinum. Electronically Signed   By: Elmer Picker M.D.   On: 09/26/2022 11:47    Assessment: 62 y.o. male   AKI Acute metabolic encephalopathy Worsening liver function, likely from liver cancer Hypertension Liver cirrhosis and hepatitis C  Plan:  -Lab reviewed, patient has developed acute renal failure, significant worsening liver function with frank jaundice, he is being admitted for metabolic encephalopathy, dehydration, and possible sepsis. -I think his overall deterioration is related to his recently diagnosed liver cancer with diffuse metastasis.  Hospitalist Dr. Marylyn Ishihara has discussed the goal of care with patient and his brother, and family wants hospice.  I called his brother Kasandra Knudsen and confirms that.  Then he also confirmed that he has talked to all of his 3 children, and they are all in agreement.  I think that is a very reasonable decision, given his multiorgan failure, overall very poor prognosis. -Would continue hydration, antibiotics for now, to see how things go in the next few days, to determine if patient can go home with home hospice, versus a candidate for residential hospice if no improvement. -I will f/u as needed    Truitt Merle, MD 09/23/2022  4:54 PM

## 2022-09-29 NOTE — Progress Notes (Deleted)
[place]  [phone] [phone]   Name: Calvin [name] Date: 10/07/2022 MRN: [id number]  DOB: 07-30-1961  Patient Care Team: Kristen Loader, FNP as PCP - General (Family Medicine) Jeanella Anton, NP as Nurse Practitioner (Nurse Practitioner) Truitt Merle, MD as Consulting Physician (Oncology)    INTERVAL HISTORY: Calvin [name] is a 62 y.o. male with oncologic medical history including alcohol abuse, hepatitis C, liver cirrhosis, hypertension, and recently diagnosed hepatocellular carcinoma with peritoneal metastasis (09/04/22).  Palliative ask to see for symptom management and goals of care as well as advanced directives .   SOCIAL HISTORY:     reports that he has been smoking cigarettes. He has never used smokeless tobacco. He reports current alcohol use. He reports that he does not use drugs.  ADVANCE DIRECTIVES:  MOST form and DNR completed and on file  CODE STATUS: DNR  PAST MEDICAL HISTORY: Past Medical History:  Diagnosis Date   Ascites    Chronic left hip pain    Cirrhosis ([place])    Hypertension    Stroke ([place])    TIA (transient ischemic attack) 06/23/2016    ALLERGIES:  has No Known Allergies.  MEDICATIONS:  Current Outpatient Medications  Medication Sig Dispense Refill   allopurinol (ZYLOPRIM) 300 MG tablet Take 300 mg by mouth daily.     buprenorphine (BUTRANS) 7.5 MCG/HR 1 patch once a week.     diphenhydrAMINE (BENADRYL) 25 MG tablet Take 25 mg by mouth at bedtime as needed for itching or sleep.     folic acid (FOLVITE) 1 MG tablet Take 1 mg by mouth daily.     furosemide (LASIX) 20 MG tablet Take 20 mg by mouth daily.     gabapentin (NEURONTIN) 300 MG capsule Take 300 mg by mouth daily.     hydrocortisone cream 1 % Apply 1 application topically 3 (three) times daily as needed for itching.     lactulose (CHRONULAC) 10 GM/15ML solution Take 30 mLs (20 g total) by mouth 2 (two) times daily.  236 mL 0   omeprazole (PRILOSEC OTC) 20 MG tablet Take 2 tablets (40 mg total) by mouth daily. 180 tablet 3   Oxycodone HCl 10 MG TABS Take 10 mg by mouth in the morning, at noon, and at bedtime.     polyethylene glycol (MIRALAX / GLYCOLAX) 17 g packet Take 17 g by mouth daily as needed for mild constipation. 14 each 0   rifaximin (XIFAXAN) 550 MG TABS tablet Take 550 mg by mouth 2 (two) times daily.     senna (SENOKOT) 8.6 MG TABS tablet Take 2 tablets by mouth daily as needed for mild constipation.     spironolactone (ALDACTONE) 25 MG tablet Take 1 tablet (25 mg total) by mouth daily. 30 tablet 0   thiamine 100 MG tablet Take 1 tablet (100 mg total) by mouth daily. (Patient not taking: Reported on 09/03/2022)     tiZANidine (ZANAFLEX) 4 MG tablet Take 4 mg by mouth 3 (three) times daily as needed for muscle spasms.     No current facility-administered medications for this visit.    VITAL SIGNS: There were no vitals taken for this visit. There were no vitals filed for this visit.  Estimated body mass index is 26.19 kg/m as calculated from the following:   Height as of 09/10/22: '5\' 7"'$  (1.702 m).   Weight as of 09/10/22: 167 lb 3.2 oz (75.8 kg).   PERFORMANCE STATUS (ECOG) :  2 - Symptomatic, <50% confined to bed   Physical Exam General: NAD Cardiovascular: regular rate and rhythm Pulmonary: clear ant fields Abdomen: soft, nontender, + bowel sounds GU: no suprapubic tenderness Extremities: no edema, no joint deformities Skin: no rashes Neurological:   IMPRESSION: Neoplasm related pain Calvin [name] is endorsing ongoing pain in his lower back, left side, abdomen, and lower extremities. He was previously being managed at [facility]. Reports he has been on Oxycodone for more than 13 years due to chronic arthritis and hip pain. Feels his pain has worsened since recent cancer diagnosis.    He describes his pain as a constant ache, stabbing, throbbing. Nothing improves his  pain other than pain medication. We discussed use of medication and safety. He and brother verbalized understanding.    Detailed chart review conducted including PDMP showing patient has been on Buprenorphine 7.5 mg patch weekly since March 2023 and Oxycodone IR 10 mg with last prescription given on 08/21/22 qty of 120 (mos supply) per Dr. Jeanella Anton [facility]). Patient was hospitalized 12/11-12/18.     I had open and direct discussion with patient regarding his pain management. I inquired about previous medication, alcohol, and illicit drug use. [name] confirms history of crack cocaine use with last date of use in 2020. He shares his mother found out and he quit as she was passing away and could not bare the pain of knowing he was lying to her. History of alcohol use. Last drink was 3-4 weeks ago. He was drinking a pint of gin daily and occasional beers. Reports he has not used after being told he had cancer and knowing this would hasten his death due to liver involvement.    We discussed at length pain management and protocol for receiving medications by our team with emphasis on abstinence of illicit drug use, receiving medications/prescriptions by multiple providers, and responsible use of medications. He verbalized understanding. Pain contract reviewed at length. Patient willingly signed and verbalized his agreement to proceed with ongoing pain management.    Constipation  3.  Goals of Care  We discussed His current illness and what it means in the larger context of His on-going co-morbidities. Natural disease trajectory and expectations were discussed.  I discussed the importance of continued conversation with family and their medical providers regarding overall plan of care and treatment options, ensuring decisions are within the context of the patients values and GOCs.  PLAN: Established therapeutic relationship. Education provided on palliative's role in collaboration with  their Oncology team. DNR/DNI confirmed.  Out of facility form completed and patient provided with original. MOLST form completed (see above).  Original copy given to patient. Continue to take lactulose 2 times a day to help with constipation Oxycodone '10mg'$  every 6 hours as needed for pain. Prescription received for 30 day supply on 11/30 (hospitalized 12/11-12/18).  Butran 7.5 patch filled 11/30 for 30 day supply.  Mirlalax daily for bowel regimen Education provided on advanced directive.  Patient has completed documents with his brother.  Understands he will receive a call to finalize documents.  Referral to advanced directives clinic. I will plan to see patient back in 2-4 weeks in collaboration to other oncology appointments.    Patient expressed understanding and was in agreement with this plan. He also understands that He can call the clinic at any time with any questions, concerns, or complaints.   Any controlled substances utilized were prescribed in the context of palliative care. PDMP has been reviewed.  Time Total: ***  Visit consisted of counseling and education dealing with the complex and emotionally intense issues of symptom management and palliative care in the setting of serious and potentially life-threatening illness.Greater than 50%  of this time was spent counseling and coordinating care related to the above assessment and plan.  Alda Lea, AGPCNP-BC  Palliative Medicine Team/[facility]

## 2022-09-29 NOTE — ED Notes (Signed)
Updated patient's family member on plan of care

## 2022-09-29 NOTE — Progress Notes (Signed)
A consult was received from an ED physician for vancomycin & cefepime per pharmacy dosing.  The patient's profile has been reviewed for ht/wt/allergies/indication/available labs.   A one time order has been placed for vancomycin 1500 mg & cefepime 2 gm.    Further antibiotics/pharmacy consults should be ordered by admitting physician if indicated.                       Thank you,  Eudelia Bunch, Pharm.D Use secure chat for questions 10/14/2022 1:02 PM

## 2022-09-29 NOTE — ED Triage Notes (Signed)
Ems brings pt in from home for altered mental status. Family states pt was supposed to have first chemo treatment today but when they arrived pt was just laying in bed. Family reports pt is not acting like himself.

## 2022-09-29 NOTE — ED Notes (Signed)
Patient continues to moan in discomfort.  Repositioned in bed for comfort

## 2022-09-29 NOTE — ED Provider Notes (Signed)
[facility] DEPT Provider Note   CSN: [id number] Arrival date & time: 10/14/2022  1042     History  Chief Complaint  Patient presents with   Altered Mental Status    Calvin [name] is a 62 y.o. male.  62 year old male with prior medical history as detailed below presents from home via EMS transport.  Patient's family called EMS after they found him in his bed where he was less responsive than his normal.  Patient is DNR.  Patient with relevant history of advanced metastatic hepatocellular carcinoma.  It appears that he was to start palliative chemo with therapy today.  LKW unknown.   Patient appears comfortable on exam.  He is not forthcoming with history.  He reports that he wants to go home.  He denies pain.  He appears to be mildly disoriented.  He cannot tell me that he is at the hospital.  The history is provided by the patient and medical records.       Home Medications Prior to Admission medications   Medication Sig Start Date End Date Taking? Authorizing Provider  allopurinol (ZYLOPRIM) 300 MG tablet Take 300 mg by mouth daily. 10/22/20   [provider]  buprenorphine (BUTRANS) 7.5 MCG/HR 1 patch once a week. 06/21/22   [provider]  diphenhydrAMINE (BENADRYL) 25 MG tablet Take 25 mg by mouth at bedtime as needed for itching or sleep.    [provider]  folic acid (FOLVITE) 1 MG tablet Take 1 mg by mouth daily. 02/14/20   [provider]  furosemide (LASIX) 20 MG tablet Take 20 mg by mouth daily. 02/14/20   [provider]  gabapentin (NEURONTIN) 300 MG capsule Take 300 mg by mouth daily.    [provider]  hydrocortisone cream 1 % Apply 1 application topically 3 (three) times daily as needed for itching.    [provider]  lactulose (CHRONULAC) 10 GM/15ML solution Take 30 mLs (20 g total) by mouth 2 (two) times daily. 04/07/20   Elgergawy, Silver Huguenin, MD  omeprazole (PRILOSEC  OTC) 20 MG tablet Take 2 tablets (40 mg total) by mouth daily. 05/21/21 09/03/22  Otis Brace, MD  Oxycodone HCl 10 MG TABS Take 10 mg by mouth in the morning, at noon, and at bedtime.    [provider]  polyethylene glycol (MIRALAX / GLYCOLAX) 17 g packet Take 17 g by mouth daily as needed for mild constipation. 09/08/22   Aline August, MD  rifaximin (XIFAXAN) 550 MG TABS tablet Take 550 mg by mouth 2 (two) times daily.    [provider]  senna (SENOKOT) 8.6 MG TABS tablet Take 2 tablets by mouth daily as needed for mild constipation.    [provider]  spironolactone (ALDACTONE) 25 MG tablet Take 1 tablet (25 mg total) by mouth daily. 09/08/22   Aline August, MD  thiamine 100 MG tablet Take 1 tablet (100 mg total) by mouth daily. Patient not taking: Reported on 09/03/2022 04/08/20   Elgergawy, Silver Huguenin, MD  tiZANidine (ZANAFLEX) 4 MG tablet Take 4 mg by mouth 3 (three) times daily as needed for muscle spasms. 11/17/20   [provider]      Allergies    Patient has no known allergies.    Review of Systems   Review of Systems  Unable to perform ROS: Acuity of condition    Physical Exam Updated Vital Signs BP 131/86 (BP Location: Right Arm)   Pulse (!) 109  Resp (!) 23   SpO2 98%  Physical Exam Vitals and nursing note reviewed.  Constitutional:      General: He is not in acute distress.    Appearance: He is well-developed.     Comments: Alert but confused, nonfocal.  Jaundiced.  HENT:     Head: Normocephalic and atraumatic.  Eyes:     Pupils: Pupils are equal, round, and reactive to light.     Comments: Icteric conjunctiva  Cardiovascular:     Rate and Rhythm: Regular rhythm. Tachycardia present.     Heart sounds: Normal heart sounds.  Pulmonary:     Effort: Pulmonary effort is normal. No respiratory distress.     Breath sounds: Normal breath sounds.  Abdominal:     General: There is distension.     Palpations: Abdomen is  soft.     Tenderness: There is no abdominal tenderness.  Musculoskeletal:        General: No deformity. Normal range of motion.     Cervical back: Normal range of motion and neck supple.  Skin:    General: Skin is warm and dry.  Neurological:     General: No focal deficit present.     Mental Status: He is alert.     Comments: Alert, oriented to self.  No focal weakness.  No facial droop.  No change in speech.     ED Results / Procedures / Treatments   Labs (all labs ordered are listed, but only abnormal results are displayed) Labs Reviewed  LACTIC ACID, PLASMA - Abnormal; Notable for the following components:      Result Value   Lactic Acid, Venous 6.4 (*)    All other components within normal limits  COMPREHENSIVE METABOLIC PANEL - Abnormal; Notable for the following components:   Potassium 5.2 (*)    CO2 16 (*)    BUN 115 (*)    Creatinine, Ser 3.25 (*)    Calcium 11.3 (*)    Total Protein 8.2 (*)    Albumin 2.4 (*)    AST 212 (*)    ALT 137 (*)    Alkaline Phosphatase 148 (*)    Total Bilirubin 12.8 (*)    GFR, Estimated 21 (*)    Anion gap 16 (*)    All other components within normal limits  CBC WITH DIFFERENTIAL/PLATELET - Abnormal; Notable for the following components:   WBC 35.0 (*)    RBC 3.10 (*)    Hemoglobin 9.9 (*)    HCT 31.8 (*)    MCV 102.6 (*)    RDW 28.6 (*)    nRBC 12.0 (*)    Neutro Abs 29.9 (*)    Monocytes Absolute 2.8 (*)    Abs Immature Granulocytes 0.94 (*)    All other components within normal limits  LIPASE, BLOOD - Abnormal; Notable for the following components:   Lipase 55 (*)    All other components within normal limits  PROTIME-INR - Abnormal; Notable for the following components:   Prothrombin Time 24.2 (*)    INR 2.2 (*)    All other components within normal limits  RESP PANEL BY RT-PCR (RSV, FLU A&B, COVID)  [id number]  ETHANOL  AMMONIA  TROPONIN I (HIGH SENSITIVITY)    EKG None  Radiology CT Head Wo Contrast  Result  Date: 09/23/2022 CLINICAL DATA:  Altered mental status. EXAM: CT HEAD WITHOUT CONTRAST TECHNIQUE: Contiguous axial images were obtained from the base of the skull through the vertex without intravenous contrast.  RADIATION DOSE REDUCTION: This exam was performed according to the departmental dose-optimization program which includes automated exposure control, adjustment of the mA and/or kV according to patient size and/or use of iterative reconstruction technique. COMPARISON:  CT head and MRI brain dated September 01, 2022. FINDINGS: Brain: No evidence of acute infarction, hemorrhage, hydrocephalus, extra-axial collection or mass lesion/mass effect. Stable mild atrophy. Vascular: Atherosclerotic vascular calcification of the carotid siphons. No hyperdense vessel. Skull: Normal. Negative for fracture or focal lesion. Sinuses/Orbits: No acute finding. Old right lamina papyracea fracture again noted. Other: None. IMPRESSION: 1. No acute intracranial abnormality. Stable mild atrophy. Electronically Signed   By: Titus Dubin M.D.   On: 09/28/2022 12:26   DG Chest Port 1 View  Result Date: 09/23/2022 CLINICAL DATA:  Altered mental status EXAM: PORTABLE CHEST 1 VIEW COMPARISON:  Previous studies including the chest radiograph done on 09/01/2022 and CT chest done on 09/06/2022 FINDINGS: Cardiac size is within normal limits. There is prominence of right paratracheal soft tissues suggesting possible lymphadenopathy. Lung fields are clear of any infiltrates or pulmonary edema. There is poor inspiration. There is no pleural effusion or pneumothorax. Deformity in the lateral end of right clavicle may suggest old injury. IMPRESSION: There are no signs of pulmonary edema or focal pulmonary consolidation There is prominence of right paratracheal soft tissues suggesting lymphadenopathy in mediastinum. Electronically Signed   By: Elmer Picker M.D.   On: 10/02/2022 11:47    Procedures Procedures    Medications Ordered  in ED Medications - No data to display  ED Course/ Medical Decision Making/ A&P                           Medical Decision Making Amount and/or Complexity of Data Reviewed Labs: ordered. Radiology: ordered.  Risk Decision regarding hospitalization.    Medical Screen Complete  This patient presented to the ED with complaint of AMS.  This complaint involves an extensive number of treatment options. The initial differential diagnosis includes, but is not limited to, metabolic abnormality, dehydration, infection, etc.  This presentation is: Acute, Chronic, Self-Limited, Previously Undiagnosed, Uncertain Prognosis, Complicated, Systemic Symptoms, and Threat to Life/Bodily Function  Patient with known metastatic hepatocellular carcinoma.  Patient is DNR.  Patient arrives from home via EMS for evaluation of AMS.  Patient's labs are reflective of likely dehydration with possible concurrent infection.  Spectrum antibiotics initiated.  IV fluids initiated.  Patient's brother is at bedside.  He confirms patient's DNR status and understands the poor overall prognosis of the patient.  Dr. Burr Medico with oncology is aware of case and will consult.    Patient would benefit from hospitalist admission.   Additional history obtained:  Additional history obtained from Spouse External records from outside sources obtained and reviewed including prior ED visits and prior Inpatient records.    Lab Tests:  I ordered and personally interpreted labs.  The pertinent results include: CBC, CMP, lactic acid, troponin   Imaging Studies ordered:  I ordered imaging studies including CT head, plain films of chest I independently visualized and interpreted obtained imaging which showed NAD I agree with the radiologist interpretation.   Cardiac Monitoring:  The patient was maintained on a cardiac monitor.  I personally viewed and interpreted the cardiac monitor which showed an underlying rhythm  of: Sinus Tach   Medicines ordered:  I ordered medication including antibiotics, IVF  for dehydration, possible infection Reevaluation of the patient after these medicines showed that the  patient: stayed the same   Problem List / ED Course:  AMS, AKI, dehydration, lactic acidosis   Reevaluation:  After the interventions noted above, I reevaluated the patient and found that they have: improved  Disposition:  After consideration of the diagnostic results and the patients response to treatment, I feel that the patent would benefit from admission.    CRITICAL CARE Performed by: Valarie Merino   Total critical care time: 30 minutes  Critical care time was exclusive of separately billable procedures and treating other patients.  Critical care was necessary to treat or prevent imminent or life-threatening deterioration.  Critical care was time spent personally by me on the following activities: development of treatment plan with patient and/or surrogate as well as nursing, discussions with consultants, evaluation of patient's response to treatment, examination of patient, obtaining history from patient or surrogate, ordering and performing treatments and interventions, ordering and review of laboratory studies, ordering and review of radiographic studies, pulse oximetry and re-evaluation of patient's condition.          Final Clinical Impression(s) / ED Diagnoses Final diagnoses:  Altered mental status, unspecified altered mental status type    Rx / DC Orders ED Discharge Orders     None         Valarie Merino, MD 10/16/2022 1451

## 2022-09-30 ENCOUNTER — Other Ambulatory Visit: Payer: Self-pay

## 2022-09-30 DIAGNOSIS — Z66 Do not resuscitate: Secondary | ICD-10-CM | POA: Diagnosis not present

## 2022-09-30 DIAGNOSIS — K7460 Unspecified cirrhosis of liver: Secondary | ICD-10-CM | POA: Diagnosis present

## 2022-09-30 DIAGNOSIS — E860 Dehydration: Secondary | ICD-10-CM | POA: Diagnosis present

## 2022-09-30 DIAGNOSIS — N179 Acute kidney failure, unspecified: Secondary | ICD-10-CM | POA: Diagnosis not present

## 2022-09-30 DIAGNOSIS — A419 Sepsis, unspecified organism: Secondary | ICD-10-CM | POA: Diagnosis not present

## 2022-09-30 DIAGNOSIS — G9341 Metabolic encephalopathy: Secondary | ICD-10-CM | POA: Diagnosis not present

## 2022-09-30 DIAGNOSIS — Z7189 Other specified counseling: Secondary | ICD-10-CM | POA: Diagnosis not present

## 2022-09-30 DIAGNOSIS — Z515 Encounter for palliative care: Principal | ICD-10-CM

## 2022-09-30 DIAGNOSIS — R4182 Altered mental status, unspecified: Secondary | ICD-10-CM

## 2022-09-30 DIAGNOSIS — D539 Nutritional anemia, unspecified: Secondary | ICD-10-CM | POA: Diagnosis present

## 2022-09-30 DIAGNOSIS — E872 Acidosis, unspecified: Secondary | ICD-10-CM | POA: Diagnosis present

## 2022-09-30 DIAGNOSIS — B182 Chronic viral hepatitis C: Secondary | ICD-10-CM | POA: Diagnosis present

## 2022-09-30 DIAGNOSIS — Z1152 Encounter for screening for COVID-19: Secondary | ICD-10-CM | POA: Diagnosis not present

## 2022-09-30 DIAGNOSIS — C220 Liver cell carcinoma: Secondary | ICD-10-CM | POA: Diagnosis not present

## 2022-09-30 DIAGNOSIS — R54 Age-related physical debility: Secondary | ICD-10-CM | POA: Diagnosis present

## 2022-09-30 DIAGNOSIS — H5461 Unqualified visual loss, right eye, normal vision left eye: Secondary | ICD-10-CM | POA: Diagnosis present

## 2022-09-30 DIAGNOSIS — Z8673 Personal history of transient ischemic attack (TIA), and cerebral infarction without residual deficits: Secondary | ICD-10-CM | POA: Diagnosis not present

## 2022-09-30 DIAGNOSIS — D638 Anemia in other chronic diseases classified elsewhere: Secondary | ICD-10-CM | POA: Diagnosis present

## 2022-09-30 DIAGNOSIS — Z79899 Other long term (current) drug therapy: Secondary | ICD-10-CM | POA: Diagnosis not present

## 2022-09-30 DIAGNOSIS — E785 Hyperlipidemia, unspecified: Secondary | ICD-10-CM | POA: Diagnosis present

## 2022-09-30 DIAGNOSIS — I10 Essential (primary) hypertension: Secondary | ICD-10-CM | POA: Diagnosis present

## 2022-09-30 DIAGNOSIS — E875 Hyperkalemia: Secondary | ICD-10-CM | POA: Diagnosis present

## 2022-09-30 DIAGNOSIS — R6520 Severe sepsis without septic shock: Secondary | ICD-10-CM | POA: Diagnosis not present

## 2022-09-30 DIAGNOSIS — F17210 Nicotine dependence, cigarettes, uncomplicated: Secondary | ICD-10-CM | POA: Diagnosis present

## 2022-09-30 MED ORDER — SODIUM CHLORIDE 0.9 % IV SOLN: INTRAVENOUS | Status: DC

## 2022-09-30 MED ORDER — HYDROMORPHONE HCL 1 MG/ML IJ SOLN: 1.0000 mg | INTRAMUSCULAR | Status: DC | PRN

## 2022-09-30 MED ORDER — GLYCOPYRROLATE 0.2 MG/ML IJ SOLN
0.3000 mg | INTRAMUSCULAR | Status: DC | PRN
  Filled 2022-09-30: qty 2

## 2022-09-30 MED ORDER — GLYCOPYRROLATE 0.2 MG/ML IJ SOLN
0.3000 mg | Freq: Three times a day (TID) | INTRAMUSCULAR | Status: DC
  Administered 2022-09-30 (×2): 0.3 mg via INTRAVENOUS
  Filled 2022-09-30 (×2): qty 2

## 2022-09-30 MED ORDER — HYDROMORPHONE HCL 1 MG/ML IJ SOLN
0.5000 mg | INTRAMUSCULAR | Status: DC | PRN
  Administered 2022-09-30: 1 mg via INTRAVENOUS
  Filled 2022-09-30: qty 1

## 2022-09-30 MED ORDER — SCOPOLAMINE 1 MG/3DAYS TD PT72
1.0000 | MEDICATED_PATCH | TRANSDERMAL | Status: DC
  Administered 2022-09-30: 1.5 mg via TRANSDERMAL
  Filled 2022-09-30: qty 1

## 2022-09-30 NOTE — Progress Notes (Signed)
Family called back stated will come to see patient when the weather is better.

## 2022-09-30 NOTE — Progress Notes (Addendum)
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, right eye blindness, prior TIA/stroke HTN HLD Recently admitted 12/11 through 12/18 with weakness and fall-found to have AKI with creatinine of 5.4 Workup showed large mass right hepatic lobe near the IVC MRI confirmed diffuse hepatic tumor He was started on Rocephin during hospitalization and referred to oncology and treated for SBP, underwent paracentesis, also kidney function were improved It was felt that his prognosis was poor  He was supposed to start chemo on 1/8 but was found to be confused It was felt on evaluation that patient had sepsis from unknown source with acute hepatic encephalopathy After further discussions comfort care was elected  Hospital-Problem based course  Chronic EtOH H with hep C and resulting cirrhosis Prior TIA stroke and right eye blindness HTN HLD Hepatocellular CA presumed AKI on admission Lactic acidosis secondary to presumed infection versus renal acidosis on admission  Sepsis secondary to H. influenzae positive  Patient's surrogate and patient have elected on palliative and comfort care-awaiting bed at [facility] Comfort related medications such as oxycodone Dilaudid Haldol Ativan and various others have been ordered  Code Status: DNR Family Communication: None available at bedside Disposition:  Status is: Inpatient Remains inpatient appropriate because:   [facility]   Subjective: Cannot obtain ROS patient is quite sleepy  Objective: Vitals:   09/30/22 0030 09/30/22 0115 09/30/22 0200 09/30/22 0332  BP: 117/78 131/89 123/81 134/81  Pulse: (!) 103 (!) 105 (!) 103 100  Resp: (!) 24 (!) 36 (!) 34 17  Temp:      TempSrc:      SpO2: 97% 94% 94% 93%    Intake/Output Summary (Last 24 hours) at 09/30/2022 0805 Last  data filed at 09/30/2022 0600 Gross per 24 hour  Intake 1353.37 ml  Output --  Net 1353.37 ml   There were no vitals filed for this visit.  Examination:  Somnolent black male looking emaciated S1-S2 no murmur Distended abdomen pelvis Extremities are warm   Data Reviewed: personally reviewed   CBC    Component Value Date/Time   WBC 35.0 (H) 10/02/2022 1135   RBC 3.10 (L) 10/16/2022 1135   HGB 9.9 (L) 10/07/2022 1135   HCT 31.8 (L) 10/12/2022 1135   PLT 219 10/19/2022 1135   MCV 102.6 (H) 10/05/2022 1135   MCH 31.9 10/12/2022 1135   MCHC 31.1 09/26/2022 1135   RDW 28.6 (H) 10/09/2022 1135   LYMPHSABS 1.2 10/22/2022 1135   MONOABS 2.8 (H) 10/11/2022 1135   EOSABS 0.0 10/18/2022 1135   BASOSABS 0.1 10/05/2022 1135      Latest Ref Rng & Units 10/20/2022   11:35 AM 09/08/2022    3:41 AM 09/07/2022    2:12 AM  CMP  Glucose 70 - 99 mg/dL 87  92  94   BUN 8 - 23 mg/dL 115  14  16   Creatinine 0.61 - 1.24 mg/dL 3.25  0.97  1.02   Sodium 135 - 145 mmol/L 143  140  138   Potassium 3.5 - 5.1 mmol/L 5.2  3.4  4.1   Chloride 98 - 111 mmol/L 111  106  106   CO2 22 - 32 mmol/L '16  23  22   '$ Calcium 8.9 - 10.3 mg/dL 11.3  11.0  10.7   Total Protein 6.5 - 8.1  g/dL 8.2  7.5  7.5   Total Bilirubin 0.3 - 1.2 mg/dL 12.8  3.7  3.1   Alkaline Phos 38 - 126 U/L 148  138  165   AST 15 - 41 U/L 212  40  39   ALT 0 - 44 U/L 137  29  36      Radiology Studies: CT Head Wo Contrast  Result Date: 09/23/2022 CLINICAL DATA:  Altered mental status. EXAM: CT HEAD WITHOUT CONTRAST TECHNIQUE: Contiguous axial images were obtained from the base of the skull through the vertex without intravenous contrast. RADIATION DOSE REDUCTION: This exam was performed according to the departmental dose-optimization program which includes automated exposure control, adjustment of the mA and/or kV according to patient size and/or use of iterative reconstruction technique. COMPARISON:  CT head and MRI brain dated  September 01, 2022. FINDINGS: Brain: No evidence of acute infarction, hemorrhage, hydrocephalus, extra-axial collection or mass lesion/mass effect. Stable mild atrophy. Vascular: Atherosclerotic vascular calcification of the carotid siphons. No hyperdense vessel. Skull: Normal. Negative for fracture or focal lesion. Sinuses/Orbits: No acute finding. Old right lamina papyracea fracture again noted. Other: None. IMPRESSION: 1. No acute intracranial abnormality. Stable mild atrophy. Electronically Signed   By: Titus Dubin M.D.   On: 10/03/2022 12:26   DG Chest Port 1 View  Result Date: 10/08/2022 CLINICAL DATA:  Altered mental status EXAM: PORTABLE CHEST 1 VIEW COMPARISON:  Previous studies including the chest radiograph done on 09/01/2022 and CT chest done on 09/06/2022 FINDINGS: Cardiac size is within normal limits. There is prominence of right paratracheal soft tissues suggesting possible lymphadenopathy. Lung fields are clear of any infiltrates or pulmonary edema. There is poor inspiration. There is no pleural effusion or pneumothorax. Deformity in the lateral end of right clavicle may suggest old injury. IMPRESSION: There are no signs of pulmonary edema or focal pulmonary consolidation There is prominence of right paratracheal soft tissues suggesting lymphadenopathy in mediastinum. Electronically Signed   By: Elmer Picker M.D.   On: 10/17/2022 11:47     Scheduled Meds: Continuous Infusions:   LOS: 0 days   Time spent: [place], MD [facility] To contact the attending provider between 7A-7P or the covering provider during after hours 7P-7A, please log into the web site [email or web address] and access using universal [place] password for that web site. If you do not have the password, please call the hospital operator.  09/30/2022, 8:05 AM

## 2022-09-30 NOTE — ED Notes (Signed)
ED TO INPATIENT HANDOFF REPORT  Name/Age/Gender Calvin [name] 62 y.o. male  Code Status    Code Status Orders  (From admission, onward)           Start     Ordered   10/09/2022 1438  Do not attempt resuscitation (DNR)  Continuous       Question Answer Comment  If patient has no pulse and is not breathing Do Not Attempt Resuscitation   If patient has a pulse and/or is breathing: Medical Treatment Goals COMFORT MEASURES: Keep clean/warm/dry, use medication by any route; positioning, wound care and other measures to relieve pain/suffering; use oxygen, suction/manual treatment of airway obstruction for comfort; do not transfer unless for comfort needs.   Consent: Discussion documented in EHR or advanced directives reviewed      10/10/2022 1439           Code Status History     Date Active Date Inactive Code Status Order ID Comments User Context   09/05/2022 0948 09/08/2022 1741 DNR [id number]  Earlie Counts, NP Inpatient   09/01/2022 1647 09/05/2022 0948 DNR [id number]  Marcelyn Bruins, MD ED   07/28/2020 0011 08/09/2020 1936 Full Code [id number]  Rise Patience, MD ED   04/03/2020 0550 04/07/2020 2233 Full Code [id number]  Chauncey Mann, MD ED   07/15/2019 1529 07/17/2019 2032 Full Code [id number]  Matilde Haymaker, MD ED   07/15/2019 1210 07/15/2019 1529 Full Code [id number]  Maudie Flakes, MD ED   06/23/2016 0054 06/23/2016 2014 Full Code [id number]  Norval Morton, MD ED       Home/SNF/Other Unknown/comfort care  Chief Complaint Acute metabolic encephalopathy [P71.06]  Level of Care/Admitting Diagnosis ED Disposition     ED Disposition  Admit   Condition  --   [facility] [[id number]]  Level of Care: Med-Surg [16]  May admit patient to [facility] or [facility] if equivalent level of care is available:: No  Covid Evaluation: Asymptomatic - no recent exposure (last 10 days) testing not required  Diagnosis: Acute  metabolic encephalopathy [2694854]  Admitting Physician: Jonnie Finner [[id number]]  Attending Physician: Jonnie Finner [[id number]]  Certification:: I certify this patient will need inpatient services for at least 2 midnights  Estimated Length of Stay: 5          Medical History Past Medical History:  Diagnosis Date   Ascites    Chronic left hip pain    Cirrhosis ([place])    Hypertension    Stroke [facility])    TIA (transient ischemic attack) 06/23/2016    Allergies No Known Allergies  IV Location/Drains/Wounds Patient Lines/Drains/Airways Status     Active Line/Drains/Airways     Name Placement date Placement time Site Days   Peripheral IV 09/28/2022 20 G Right Antecubital 09/26/2022  --  Antecubital  1   Incision (Closed) 09/04/22 Abdomen Right;Upper 09/04/22  1156  -- 26            Labs/Imaging Results for orders placed or performed during the hospital encounter of 10/09/2022 (from the past 48 hour(s))  Ethanol     Status: None   Collection Time: 09/28/2022 11:34 AM  Result Value Ref Range   Alcohol, Ethyl (B) <10 <10 mg/dL    Comment: (NOTE) Lowest detectable limit for serum alcohol is 10 mg/dL.  For medical purposes only. Performed at [facility], [place] [address], [place], [place] [address]   Lactic acid,  plasma     Status: Abnormal   Collection Time: 10/07/2022 11:35 AM  Result Value Ref Range   Lactic Acid, Venous 6.4 (HH) 0.5 - 1.9 mmol/L    Comment: CRITICAL RESULT CALLED TO, READ BACK BY AND VERIFIED WITH DOWD,P. RN AT 1207 10/15/2022 MULLINS,T Performed at [facility], [place] [address] [place], [place] [address]   Comprehensive metabolic panel     Status: Abnormal   Collection Time: 09/24/2022 11:35 AM  Result Value Ref Range   Sodium 143 135 - 145 mmol/L   Potassium 5.2 (H) 3.5 - 5.1 mmol/L   Chloride 111 98 - 111 mmol/L   CO2 16 (L) 22 - 32 mmol/L   Glucose, Bld 87 70 - 99 mg/dL    Comment: Glucose reference range applies  only to samples taken after fasting for at least 8 hours.   BUN 115 (H) 8 - 23 mg/dL    Comment: RESULT CONFIRMED BY MANUAL DILUTION   Creatinine, Ser 3.25 (H) 0.61 - 1.24 mg/dL   Calcium 11.3 (H) 8.9 - 10.3 mg/dL   Total Protein 8.2 (H) 6.5 - 8.1 g/dL   Albumin 2.4 (L) 3.5 - 5.0 g/dL   AST 212 (H) 15 - 41 U/L   ALT 137 (H) 0 - 44 U/L   Alkaline Phosphatase 148 (H) 38 - 126 U/L   Total Bilirubin 12.8 (H) 0.3 - 1.2 mg/dL   GFR, Estimated 21 (L) >60 mL/min    Comment: (NOTE) Calculated using the CKD-EPI Creatinine Equation (2021)    Anion gap 16 (H) 5 - 15    Comment: Performed at [facility], [place] [address] [place], [place] [address]  Troponin I (High Sensitivity)     Status: None   Collection Time: 10/05/2022 11:35 AM  Result Value Ref Range   Troponin I (High Sensitivity) 16 <18 ng/L    Comment: (NOTE) Elevated high sensitivity troponin I (hsTnI) values and significant  changes across serial measurements may suggest ACS but many other  chronic and acute conditions are known to elevate hsTnI results.  Refer to the "Links" section for chest pain algorithms and additional  guidance. Performed at [facility], [place] [address], [place], [place] [address]   CBC with Differential     Status: Abnormal   Collection Time: 09/28/2022 11:35 AM  Result Value Ref Range   WBC 35.0 (H) 4.0 - 10.5 K/uL   RBC 3.10 (L) 4.22 - 5.81 MIL/uL   Hemoglobin 9.9 (L) 13.0 - 17.0 g/dL   HCT 31.8 (L) 39.0 - 52.0 %   MCV 102.6 (H) 80.0 - 100.0 fL   MCH 31.9 26.0 - 34.0 pg   MCHC 31.1 30.0 - 36.0 g/dL   RDW 28.6 (H) 11.5 - 15.5 %   Platelets 219 150 - 400 K/uL   nRBC 12.0 (H) 0.0 - 0.2 %   Neutrophils Relative % 86 %   Neutro Abs 29.9 (H) 1.7 - 7.7 K/uL   Lymphocytes Relative 3 %   Lymphs Abs 1.2 0.7 - 4.0 K/uL   Monocytes Relative 8 %   Monocytes Absolute 2.8 (H) 0.1 - 1.0 K/uL   Eosinophils Relative 0 %   Eosinophils Absolute 0.0 0.0 - 0.5 K/uL   Basophils  Relative 0 %   Basophils Absolute 0.1 0.0 - 0.1 K/uL   Immature Granulocytes 3 %   Abs Immature Granulocytes 0.94 (H) 0.00 - 0.07 K/uL   Polychromasia PRESENT    Target Cells  PRESENT     Comment: Performed at [facility], [place] [address], [place], [place] [address]  Lipase, blood     Status: Abnormal   Collection Time: 09/30/2022 11:35 AM  Result Value Ref Range   Lipase 55 (H) 11 - 51 U/L    Comment: Performed at [facility], [place] [address], [place], [place] [address]  Protime-INR     Status: Abnormal   Collection Time: 10/13/2022 11:35 AM  Result Value Ref Range   Prothrombin Time 24.2 (H) 11.4 - 15.2 seconds   INR 2.2 (H) 0.8 - 1.2    Comment: (NOTE) INR goal varies based on device and disease states. Performed at [facility], [place] [address] [place], [place] [address]   Ammonia     Status: None   Collection Time: 09/30/2022 11:35 AM  Result Value Ref Range   Ammonia 35 9 - 35 umol/L    Comment: Performed at [facility], [place] [address], [place], [place] [address]  Resp panel by RT-PCR (RSV, Flu A&B, Covid) Anterior Nasal Swab     Status: Abnormal   Collection Time: 09/28/2022  1:00 PM   Specimen: Anterior Nasal Swab  Result Value Ref Range   SARS Coronavirus 2 by RT PCR NEGATIVE NEGATIVE    Comment: (NOTE) SARS-CoV-2 target nucleic acids are NOT DETECTED.  The SARS-CoV-2 RNA is generally detectable in upper respiratory specimens during the acute phase of infection. The lowest concentration of SARS-CoV-2 viral copies this assay can detect is 138 copies/mL. A negative result does not preclude SARS-Cov-2 infection and should not be used as the sole basis for treatment or other patient management decisions. A negative result may occur with  improper specimen collection/handling, submission of specimen other than nasopharyngeal swab, presence of viral mutation(s) within the areas targeted by this assay,  and inadequate number of viral copies(<138 copies/mL). A negative result must be combined with clinical observations, patient history, and epidemiological information. The expected result is Negative.  Fact Sheet for Patients:  EntrepreneurPulse.com.au  Fact Sheet for Healthcare Providers:  IncredibleEmployment.be  This test is no t yet approved or cleared by the [place] FDA and  has been authorized for detection and/or diagnosis of SARS-CoV-2 by FDA under an Emergency Use Authorization (EUA). This EUA will remain  in effect (meaning this test can be used) for the duration of the COVID-19 declaration under Section 564(b)(1) of the Act, 21 U.S.C.section 360bbb-3(b)(1), unless the authorization is terminated  or revoked sooner.       Influenza A by PCR POSITIVE (A) NEGATIVE   Influenza B by PCR NEGATIVE NEGATIVE    Comment: (NOTE) The Xpert Xpress SARS-CoV-2/FLU/RSV plus assay is intended as an aid in the diagnosis of influenza from Nasopharyngeal swab specimens and should not be used as a sole basis for treatment. Nasal washings and aspirates are unacceptable for Xpert Xpress SARS-CoV-2/FLU/RSV testing.  Fact Sheet for Patients: EntrepreneurPulse.com.au  Fact Sheet for Healthcare Providers: IncredibleEmployment.be  This test is not yet approved or cleared by the [place] FDA and has been authorized for detection and/or diagnosis of SARS-CoV-2 by FDA under an Emergency Use Authorization (EUA). This EUA will remain in effect (meaning this test can be used) for the duration of the COVID-19 declaration under Section 564(b)(1) of the Act, 21 U.S.C. section 360bbb-3(b)(1), unless the authorization is terminated or revoked.     Resp Syncytial Virus by PCR NEGATIVE NEGATIVE    Comment: (NOTE) Fact Sheet  for Patients: EntrepreneurPulse.com.au  Fact Sheet for Healthcare  Providers: IncredibleEmployment.be  This test is not yet approved or cleared by the [place] FDA and has been authorized for detection and/or diagnosis of SARS-CoV-2 by FDA under an Emergency Use Authorization (EUA). This EUA will remain in effect (meaning this test can be used) for the duration of the COVID-19 declaration under Section 564(b)(1) of the Act, 21 U.S.C. section 360bbb-3(b)(1), unless the authorization is terminated or revoked.  Performed at [facility], [place] [address] [place], [place] [address]    CT Head Wo Contrast  Result Date: 10/10/2022 CLINICAL DATA:  Altered mental status. EXAM: CT HEAD WITHOUT CONTRAST TECHNIQUE: Contiguous axial images were obtained from the base of the skull through the vertex without intravenous contrast. RADIATION DOSE REDUCTION: This exam was performed according to the departmental dose-optimization program which includes automated exposure control, adjustment of the mA and/or kV according to patient size and/or use of iterative reconstruction technique. COMPARISON:  CT head and MRI brain dated September 01, 2022. FINDINGS: Brain: No evidence of acute infarction, hemorrhage, hydrocephalus, extra-axial collection or mass lesion/mass effect. Stable mild atrophy. Vascular: Atherosclerotic vascular calcification of the carotid siphons. No hyperdense vessel. Skull: Normal. Negative for fracture or focal lesion. Sinuses/Orbits: No acute finding. Old right lamina papyracea fracture again noted. Other: None. IMPRESSION: 1. No acute intracranial abnormality. Stable mild atrophy. Electronically Signed   By: Titus Dubin M.D.   On: 10/11/2022 12:26   DG Chest Port 1 View  Result Date: 10/21/2022 CLINICAL DATA:  Altered mental status EXAM: PORTABLE CHEST 1 VIEW COMPARISON:  Previous studies including the chest radiograph done on 09/01/2022 and CT chest done on 09/06/2022 FINDINGS: Cardiac size is within normal limits.  There is prominence of right paratracheal soft tissues suggesting possible lymphadenopathy. Lung fields are clear of any infiltrates or pulmonary edema. There is poor inspiration. There is no pleural effusion or pneumothorax. Deformity in the lateral end of right clavicle may suggest old injury. IMPRESSION: There are no signs of pulmonary edema or focal pulmonary consolidation There is prominence of right paratracheal soft tissues suggesting lymphadenopathy in mediastinum. Electronically Signed   By: Elmer Picker M.D.   On: 09/22/2022 11:47    Pending Labs Unresulted Labs (From admission, onward)    None       Vitals/Pain Today's Vitals   10/21/2022 2240 10/13/2022 2300 10/15/2022 2345 09/30/22 0030  BP:  121/72 133/74 117/78  Pulse:  (!) 101 (!) 101 (!) 103  Resp:  (!) 29 (!) 28 (!) 24  Temp: (!) 92.1 F (33.4 C)     TempSrc: Rectal     SpO2:  98% 96% 97%    Isolation Precautions No active isolations  Medications Medications  acetaminophen (TYLENOL) tablet 650 mg (has no administration in time range)    Or  acetaminophen (TYLENOL) suppository 650 mg (has no administration in time range)  haloperidol (HALDOL) tablet 0.5 mg (has no administration in time range)    Or  haloperidol (HALDOL) 2 MG/ML solution 0.5 mg (has no administration in time range)    Or  haloperidol lactate (HALDOL) injection 0.5 mg (has no administration in time range)  ondansetron (ZOFRAN-ODT) disintegrating tablet 4 mg ( Oral See Alternative 09/28/2022 2130)    Or  ondansetron (ZOFRAN) injection 4 mg (4 mg Intravenous Given 10/15/2022 2130)  glycopyrrolate (ROBINUL) tablet 1 mg (has no administration in time range)    Or  glycopyrrolate (ROBINUL) injection 0.2 mg (has no administration in  time range)    Or  glycopyrrolate (ROBINUL) injection 0.2 mg (has no administration in time range)  antiseptic oral rinse (BIOTENE) solution 15 mL (has no administration in time range)  polyvinyl alcohol (LIQUIFILM TEARS)  1.4 % ophthalmic solution 1 drop (has no administration in time range)  oxyCODONE (ROXICODONE INTENSOL) 20 MG/ML concentrated solution 5 mg (has no administration in time range)    Or  oxyCODONE (ROXICODONE INTENSOL) 20 MG/ML concentrated solution 5 mg (has no administration in time range)  HYDROmorphone (DILAUDID) injection 0.5 mg (0.5 mg Intravenous Given 10/02/2022 2132)  LORazepam (ATIVAN) tablet 1 mg ( Oral See Alternative 10/03/2022 2132)    Or  LORazepam (ATIVAN) 2 MG/ML concentrated solution 1 mg ( Sublingual See Alternative 10/05/2022 2132)    Or  LORazepam (ATIVAN) injection 1 mg (1 mg Intravenous Given 10/03/2022 2132)  0.9 %  sodium chloride infusion ( Intravenous Not Given 10/11/2022 1511)  sodium chloride 0.9 % bolus 1,000 mL (0 mLs Intravenous Stopped 10/07/2022 1922)  ceFEPIme (MAXIPIME) 2 g in sodium chloride 0.9 % 100 mL IVPB (0 g Intravenous Stopped 10/13/2022 1425)    Mobility non-ambulatory

## 2022-09-30 NOTE — Progress Notes (Signed)
Patient in bed on oxygen at 5lpm. Vital obtain blood pressure 49/27. R 12, episodes of apnea noted. Notified MD. Informed family on status changed.

## 2022-09-30 NOTE — Plan of Care (Signed)
  Problem: Respiratory: Goal: Verbalizations of increased ease of respirations will increase Outcome: Progressing   Problem: Pain Management: Goal: Satisfaction with pain management regimen will improve Outcome: Progressing   Problem: Pain Managment: Goal: General experience of comfort will improve Outcome: Progressing   Problem: Coping: Goal: Level of anxiety will decrease Outcome: Progressing

## 2022-09-30 NOTE — Progress Notes (Addendum)
Manufacturing engineer (ACC)Hospital Liaison Note  Referral received for patient/family interest in [facility]. Chart under review by [facility] physician.   Hospice eligibility confirmed.  Unfortunately, [facility] is unable to offer a bed today. [facility] liaison will continue to follow and offer a bed if one becomes available.   Please call with any questions or concerns. Thank you  Roselee Nova, [facility] Liaison [phone]

## 2022-09-30 NOTE — ED Notes (Signed)
Patient transported to floor at this time.

## 2022-09-30 NOTE — Plan of Care (Signed)
  Problem: Coping: Goal: Ability to identify and develop effective coping behavior will improve Outcome: Progressing   

## 2022-09-30 NOTE — ED Notes (Signed)
Patient provided with additional warm blankets.  Repositioned for comfort

## 2022-09-30 NOTE — TOC Initial Note (Signed)
Transition of Care [facility]) - Initial/Assessment Note   Patient Details  Name: Calvin [name] MRN: [id number] Date of Birth: 01/04/1961  Transition of Care [facility]) CM/SW Contact:    Calvin Don, LCSW Phone Number: 09/30/2022, 1:30 PM  Clinical Narrative: Palliative notified TOC that patient is appropriate for residential hospice and family is requesting [facility] as it is closer to family. Family unable to drive to the [facility] location due to unreliable transportation issues at this time. CSW confirmed family's choice with patient's brother, Calvin [name]. CSW made referral to Sarah with [facility] [employer]. As there are no beds available at this time, patient will be on a waitlist until one becomes available. TOC to follow.  Expected Discharge Plan: [place] Barriers to Discharge: Hospice Bed not available  Patient Goals and CMS Choice Patient states their goals for this hospitalization and ongoing recovery are:: Discharge to [facility] when a bed is available CMS Medicare.gov Compare Post Acute Care list provided to:: Patient Represenative (must comment) Therapist, art (brother)) Choice offered to / list presented to : Sibling  Expected Discharge Plan and Services In-house Referral: Clinical Social Work Post Acute Care Choice: Hospice Living arrangements for the past 2 months: Apartment           DME Arranged: N/A DME Agency: NA  Prior Living Arrangements/Services Living arrangements for the past 2 months: Apartment Patient language and need for interpreter reviewed:: Yes Need for Family Participation in Patient Care: Yes (Comment) Care giver support system in place?: Yes (comment) Criminal Activity/Legal Involvement Pertinent to Current Situation/Hospitalization: No - Comment as needed  Permission Sought/Granted Permission sought to share information with : Other (comment) Permission granted to share information with : Yes, Verbal Permission  Granted Permission granted to share info w AGENCY: [facility] for admission to [facility]  Emotional Assessment Orientation: : Oriented to Self, Oriented to Place, Oriented to  Time, Oriented to Situation Alcohol / Substance Use: Not Applicable Psych Involvement: No (comment)  Admission diagnosis:  Altered mental status, unspecified altered mental status type [F81.01] Acute metabolic encephalopathy [B51.02] Patient Active Problem List   Diagnosis Date Noted   Acute metabolic encephalopathy 58/52/7782   Sepsis ([place]) 10/19/2022   History of TIA (transient ischemic attack) 09/28/2022   Goals of care, counseling/discussion 09/10/2022   Hepatocellular carcinoma ([place]) 09/09/2022   SBP (spontaneous bacterial peritonitis) ([place]) 09/06/2022   AKI (acute kidney injury) ([place]) 09/01/2022   Liver mass 09/01/2022   Uremia 09/01/2022   High anion gap metabolic acidosis 42/35/3614   Leukocytosis 09/01/2022   DNR (do not resuscitate)    Palliative care encounter    Physical deconditioning 43/15/4008   Metabolic encephalopathy    ARF (acute renal failure) ([place]) 07/27/2020   Hyperkalemia 04/02/2020   Hallucinations 04/02/2020   Anemia of chronic disease 04/02/2020   Chronic hepatitis C without hepatic coma ([place]) 08/09/2019   Ascites due to alcoholic cirrhosis (HCC)    SOB (shortness of breath)    Decompensated hepatic cirrhosis ([place]) 07/15/2019   Essential hypertension 06/23/2016   Alcohol abuse 06/23/2016   Chronic left hip pain 06/23/2016   Hyponatremia 06/23/2016   Hyperlipidemia    Blind right eye    PCP:  Calvin Loader, FNP Pharmacy:   [facility] [place], [place] AT [address] [place] [place] [address] Phone: [phone] Fax: [phone]  Social Determinants of Health (SDOH) Social History:  SDOH Screenings   Transportation Needs: Unmet Transportation Needs (09/12/2022)  Financial Resource  Strain: High Risk (09/12/2022)  Tobacco Use: High Risk (10/07/2022)   SDOH Interventions: Transportation Interventions: PTAR ([place]) Financial Strain Interventions: Intervention Not Indicated (Patient will discharge to residential hospice when a bed becomes available.)  Readmission Risk Interventions    08/09/2020    1:56 PM  Readmission Risk Prevention Plan  Transportation Screening Complete  Medication Review Press photographer) Complete  PCP or Specialist appointment within 3-5 days of discharge Complete  HRI or [place] Complete  SW Recovery Care/Counseling Consult Complete  Palliative Care Screening Complete  [place] Not Applicable

## 2022-09-30 NOTE — Plan of Care (Signed)
  Problem: Respiratory: Goal: Verbalizations of increased ease of respirations will increase Outcome: Not Met (add Reason)  Pt pretty much unresponsive. Shallow respirations

## 2022-09-30 NOTE — Consult Note (Signed)
Palliative Care Consult Note                                  Date: 09/30/2022   Patient Name: Calvin [name]  DOB: 11/16/1960  MRN: [id number]  Age / Sex: 62 y.o., male  PCP: Kristen Loader, FNP Referring Physician: Nita Sells, MD  Reason for Consultation: Establishing goals of care, Hospice Evaluation, and Terminal Care  HPI/Patient Profile: Palliative Care consult requested for goals of care discussion in this 62 y.o. male  with past medical history of advanced hepatocellular cancer, hepatitis C, hyperlipidemia, hypertension, CVA, and cirrhosis.  He was admitted on 10/03/2022 from home with altered mental status, weakness, and sepsis.  During workup patient also found to be influenza A positive.  Past Medical History:  Diagnosis Date   Ascites    Chronic left hip pain    Cirrhosis ([place])    Hypertension    Stroke [facility])    TIA (transient ischemic attack) 06/23/2016     Subjective:   This NP Osborne Oman reviewed medical records, received report from team, assessed the patient and spoke with patient's brother Kasandra Knudsen by phone to discuss diagnosis, prognosis, GOC, EOL wishes disposition and options.  Mr. Mcanany is minimally responsive.  Comfort focused care.  He is unable to engage in discussions or follow commands.  Patient and his family are familiar to myself and palliative care.  I have been actively following patient at [facility] for ongoing goals of care discussions and symptom management support.  Brother is appreciative of continued care and continuity.   I created space and opportunity for family to explore state of health prior to admission, thoughts, and feelings.  Kasandra Knudsen shares patient has "suffered" for long enough.  States family has had extensive discussions amongst themselves as well as with patient in the past.  He shares Hilery was doing as good as he could over the past several weeks however began  to decline over the past 1-2 days.  We discussed His current illness and what it means in the larger context of His on-going co-morbidities. Natural disease trajectory and expectations were discussed.  Family has had extensive discussions with Dr.Feng and hospitalist team.  They are well aware and realistic in their understanding of patient's poor prognosis.  Kasandra Knudsen is clear and expressed wishes on behalf of his family and in best interest of his brother to focus solely on his comfort allow him to spend what time he has left with minimal suffering.  Emotional support provided.  I reviewed comfort measures while hospitalized.  He verbalized understanding.  Questions and concerns were addressed. The family was encouraged to call with questions or concerns.  PMT will continue to support holistically as needed.   Objective:   Primary Diagnoses: Present on Admission:  AKI (acute kidney injury) ([place])  Anemia of chronic disease  Chronic hepatitis C without hepatic coma (HCC)  Essential hypertension  Hepatocellular carcinoma (HCC)  Hyperkalemia  Metabolic encephalopathy  Acute metabolic encephalopathy   Scheduled Meds:  glycopyrrolate  0.3 mg Intravenous Q8H   scopolamine  1 patch Transdermal Q72H    Continuous Infusions:   PRN Meds: acetaminophen **OR** acetaminophen, antiseptic oral rinse, [DISCONTINUED] glycopyrrolate **OR** [DISCONTINUED] glycopyrrolate **OR** glycopyrrolate, [DISCONTINUED] haloperidol **OR** [DISCONTINUED] haloperidol **OR** haloperidol lactate, HYDROmorphone (DILAUDID) injection, [DISCONTINUED] LORazepam **OR** [DISCONTINUED] LORazepam **OR** LORazepam, [DISCONTINUED] ondansetron **OR** ondansetron (ZOFRAN) IV, [DISCONTINUED] oxyCODONE **OR** oxyCODONE, polyvinyl alcohol  No Known Allergies  Review of Systems  Unable to perform ROS: Acuity of condition    Physical Exam General: Somnolent, frail chronically-ill appearing Cardiovascular: Tachycardic Pulmonary:  Audible congestion and wheeze, mouth breathing Abdomen: Distended Extremities: no edema, no joint deformities Skin: no rashes, warm and dry Neurological: Somnolent, unable to follow commands, gripping bedrail with loud moan and grimacing  Vital Signs:  BP 134/81 (BP Location: Right Arm)   Pulse 100   Temp (!) 92.1 F (33.4 C) (Rectal)   Resp 17   SpO2 93%  Pain Scale: 0-10 POSS *See Group Information*: S-Acceptable,Sleep, easy to arouse Pain Score: Asleep  SpO2: SpO2: 93 % O2 Device:SpO2: 93 % O2 Flow Rate: .   IO: Intake/output summary:  Intake/Output Summary (Last 24 hours) at 09/30/2022 1143 Last data filed at 09/30/2022 0600 Gross per 24 hour  Intake 1353.37 ml  Output --  Net 1353.37 ml    LBM: Last BM Date :  (PTA) Baseline Weight:   Most recent weight:        Palliative Assessment/Data: PPS 10%   Advanced Care Planning:   Primary Decision Maker: NEXT OF KIN  Code Status/Advance Care Planning: DNR  Patient's family has been clear and expressed wishes to focus solely on patient's comfort for what time he has left.  Hospice services outpatient were explained and offered. Family verbalized their understanding and awareness of hospice's goals and philosophy of care.  Kasandra Knudsen reports family was initially hopeful that patient could return home and spend his last moments there amongst his family and friends.  I discussed at length with brother at the current time patient is not stable to return home as well as concerns for effective symptom management which would require IV access and around-the-clock monitoring.  He verbalizes understanding expressing family wants was best for patient and would like for patient to be considered for residential hospice.  Education provided on residential hospice referral as well as anticipated hospital death in the event no beds are available.  Family verbalized understanding.  Kasandra Knudsen states family's preference would be for residential hospice  located in [place] due to limited transportation and importance of being able to see patient as needed.  Acknowledge family's request.  Assessment & Plan:   SUMMARY OF RECOMMENDATIONS   DNR/DNI-DNR completed and placed on chart Continue with comfort measures only and aggressive symptom management. Extensive discussion with patient's brother.  He is clear and expressed wishes to continue focusing solely on patient's comfort and minimizing his suffering.  Family is aware patient is not stable to return home and would also require extensive symptom management.  They would like patient to be considered for residential hospice with preference of [facility] location.  Family is also aware patient could potentially pass away while hospitalized.  Education provided on referral process. TOC referral placed for residential hospice placement. PMT will continue to support and follow as needed. Please call team line with urgent needs.  Symptom Management:  Dilaudid PRN for pain/air hunger/comfort Robinul scheduled and PRN for excessive secretions Ativan PRN for agitation/anxiety Zofran PRN for nausea Liquifilm tears PRN for dry eyes Haldol PRN for agitation/anxiety Scopolamine patch May have comfort feeding Comfort cart for family Unrestricted visitations in the setting of EOL (per policy) Oxygen PRN 2L or less for comfort. No escalation.   Vital signs daily (5 AM)   Palliative Prophylaxis:  Frequent Pain Assessment, Oral Care, and Turn Reposition  Additional Recommendations (Limitations, Scope, Preferences): Full Comfort Care  Psycho-social/Spiritual:  Desire  for further Chaplaincy support: no Additional Recommendations: Education on Hospice  Prognosis:  < 2 weeks  Discharge Planning:  Hospice facility versus anticipate hospital death pending bed placement   Patient's brother, Kasandra Knudsen expressed understanding and was in agreement with this plan.   Time Total: 55 min   Visit  consisted of counseling and education dealing with the complex and emotionally intense issues of symptom management and palliative care in the setting of serious and potentially life-threatening illness.Greater than 50%  of this time was spent counseling and coordinating care related to the above assessment and plan.  Signed by:  Alda Lea, AGPCNP-BC [place]   Phone: [phone] Pager: [phone] Amion: Bjorn Pippin   Thank you for allowing the Palliative Medicine Team to assist in the care of this patient. Please utilize secure chat with additional questions, if there is no response within 30 minutes please call the above phone number. Palliative Medicine Team providers are available by phone from 7am to 5pm daily and can be reached through the team cell phone.  Should this patient require assistance outside of these hours, please call the patient's attending physician.

## 2022-10-01 DIAGNOSIS — R6520 Severe sepsis without septic shock: Secondary | ICD-10-CM | POA: Diagnosis not present

## 2022-10-01 DIAGNOSIS — N179 Acute kidney failure, unspecified: Secondary | ICD-10-CM | POA: Diagnosis not present

## 2022-10-01 DIAGNOSIS — A419 Sepsis, unspecified organism: Secondary | ICD-10-CM | POA: Diagnosis not present

## 2022-10-02 ENCOUNTER — Inpatient Hospital Stay: Payer: Medicare Other | Admitting: Hematology

## 2022-10-02 ENCOUNTER — Inpatient Hospital Stay: Payer: Medicare Other

## 2022-10-09 ENCOUNTER — Ambulatory Visit: Payer: Medicare Other | Admitting: Hematology

## 2022-10-09 ENCOUNTER — Other Ambulatory Visit: Payer: Medicare Other

## 2022-10-09 ENCOUNTER — Ambulatory Visit: Payer: Medicare Other

## 2022-10-09 ENCOUNTER — Encounter: Payer: Medicare Other | Admitting: Nutrition

## 2022-10-11 DIAGNOSIS — M25511 Pain in right shoulder: Secondary | ICD-10-CM | POA: Diagnosis not present

## 2022-10-16 ENCOUNTER — Other Ambulatory Visit: Payer: Medicare Other

## 2022-10-16 ENCOUNTER — Ambulatory Visit: Payer: Medicare Other

## 2022-10-16 ENCOUNTER — Encounter: Payer: Medicare Other | Admitting: Dietician

## 2022-10-16 ENCOUNTER — Ambulatory Visit: Payer: Medicare Other | Admitting: Hematology

## 2022-10-23 ENCOUNTER — Ambulatory Visit: Payer: Medicare Other | Admitting: Hematology

## 2022-10-23 ENCOUNTER — Ambulatory Visit: Payer: Medicare Other

## 2022-10-23 ENCOUNTER — Other Ambulatory Visit: Payer: Medicare Other

## 2022-10-23 NOTE — Progress Notes (Signed)
Pt found unresponsive without respirations, pulse , or BP. Confirmed by Kathlyn [place] RN and Carma Leaven RN. Family and on call provider notified.

## 2022-10-23 NOTE — Progress Notes (Signed)
    OVERNIGHT PROGRESS REPORT  Notified by RN that patient has expired at 0230 hrs.  Patient was DNR/comfort care followed by Hospice and Palliative.  2 RN verified.  Family was not at bedside but were notified by Assigned RN successfully.     Gershon Cull MSNA ACNPC-AG Acute Care Nurse Practitioner [place]

## 2022-10-23 NOTE — Discharge Summary (Signed)
DEATH SUMMARY   Patient Details  Name: Calvin [name] MRN: [id number] DOB: [birth year] WUJ:[id number], Beryle Lathe, FNP  Admission/Discharge Information   Admit Date:  10-29-22  Date of Death: Date of Death: 2022/10/31  Time of Death: Time of Death: 0230  Length of Stay: 1   Principle Cause of death:  Sepsis from undefined source Metabolic encephalopathy from infection/cirrhosis  Hospital Diagnoses: Principal Problem:   Sepsis ([place]) Active Problems:   Essential hypertension   Chronic hepatitis C without hepatic coma (HCC)   Hyperkalemia   Anemia of chronic disease   Metabolic encephalopathy   AKI (acute kidney injury) ([place])   Hepatocellular carcinoma (HCC)   History of TIA (transient ischemic attack)   Acute metabolic encephalopathy   Hospital Course:  62 year old male known history of chronic EtOH with hep C and cirrhosis, right eye blindness, prior TIA/stroke HTN HLD Recently admitted 12/11 through 12/18 with weakness and fall-  Returned to ED 1/8--found to have AKI with creatinine of 5.4 Workup showed large mass right hepatic lobe near the IVC MRI confirmed diffuse hepatic tumor He was started on Rocephin during hospitalization and referred to oncology and treated for SBP, underwent paracentesis, also kidney function were improved It was felt that his prognosis was poor   He was supposed to start chemo on 10/29/22 but was found to be confused, had lactic acidosis of undefined source, [facility]--could have had sepsis on admit It was felt on evaluation that patient had sepsis from unknown source with acute hepatic encephalopathy After further discussions comfort care was elected  He was scheudled to trasnfer to [facility] but expired prior to this  Assessment and Plan: No notes have been filed under this hospital service. Service: Hospitalist  The results of significant diagnostics from this hospitalization (including imaging, microbiology, ancillary and laboratory) are  listed below for reference.   Significant Diagnostic Studies: CT Head Wo Contrast  Result Date: 10-29-2022 CLINICAL DATA:  Altered mental status. EXAM: CT HEAD WITHOUT CONTRAST TECHNIQUE: Contiguous axial images were obtained from the base of the skull through the vertex without intravenous contrast. RADIATION DOSE REDUCTION: This exam was performed according to the departmental dose-optimization program which includes automated exposure control, adjustment of the mA and/or kV according to patient size and/or use of iterative reconstruction technique. COMPARISON:  CT head and MRI brain dated September 01, 2022. FINDINGS: Brain: No evidence of acute infarction, hemorrhage, hydrocephalus, extra-axial collection or mass lesion/mass effect. Stable mild atrophy. Vascular: Atherosclerotic vascular calcification of the carotid siphons. No hyperdense vessel. Skull: Normal. Negative for fracture or focal lesion. Sinuses/Orbits: No acute finding. Old right lamina papyracea fracture again noted. Other: None. IMPRESSION: 1. No acute intracranial abnormality. Stable mild atrophy. Electronically Signed   By: Titus Dubin M.D.   On: 2022/10/29 12:26   DG Chest Port 1 View  Result Date: 29-Oct-2022 CLINICAL DATA:  Altered mental status EXAM: PORTABLE CHEST 1 VIEW COMPARISON:  Previous studies including the chest radiograph done on 09/01/2022 and CT chest done on 09/06/2022 FINDINGS: Cardiac size is within normal limits. There is prominence of right paratracheal soft tissues suggesting possible lymphadenopathy. Lung fields are clear of any infiltrates or pulmonary edema. There is poor inspiration. There is no pleural effusion or pneumothorax. Deformity in the lateral end of right clavicle may suggest old injury. IMPRESSION: There are no signs of pulmonary edema or focal pulmonary consolidation There is prominence of right paratracheal soft tissues suggesting lymphadenopathy in mediastinum. Electronically Signed   By: Royston Cowper  Rathinasamy M.D.   On: 09/24/2022 11:47    Microbiology: No results found for this or any previous visit (from the past 240 hour(s)).  Time spent: 22 minutes  Signed: Nita Sells, MD 10-10-2022

## 2022-10-23 DEATH — deceased

## 2022-11-05 LAB — MISCELLANEOUS TEST

## 2022-11-18 ENCOUNTER — Ambulatory Visit: Payer: Medicare Other | Admitting: Internal Medicine

## 2023-01-06 IMAGING — US US ABDOMEN LIMITED
1 series · 14 of 25 positions shown · non-contrast
Comparison: Ultrasound dated 06/04/2021.

CLINICAL DATA: Cirrhosis.

EXAM:
ULTRASOUND ABDOMEN LIMITED RIGHT UPPER QUADRANT

[Series 1: us abdomen limited · 0.17mm/px · 14 of 42 slices shown]
[im 1/42]
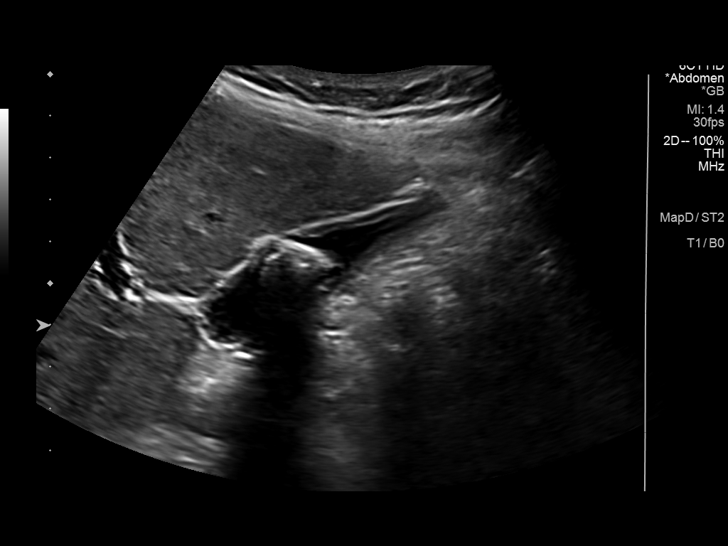
[im 4/42]
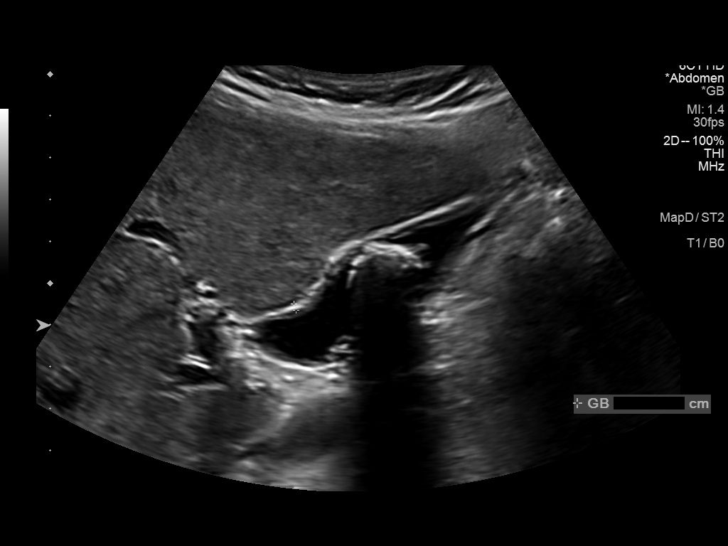
[im 7/42]
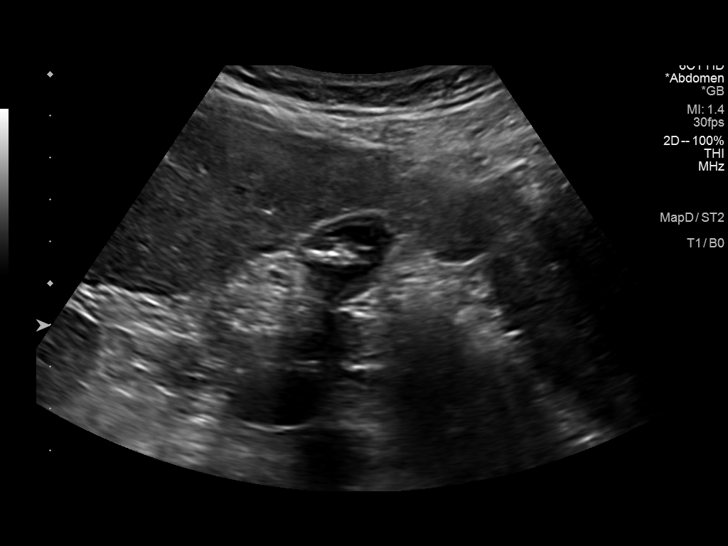
[im 11/42]
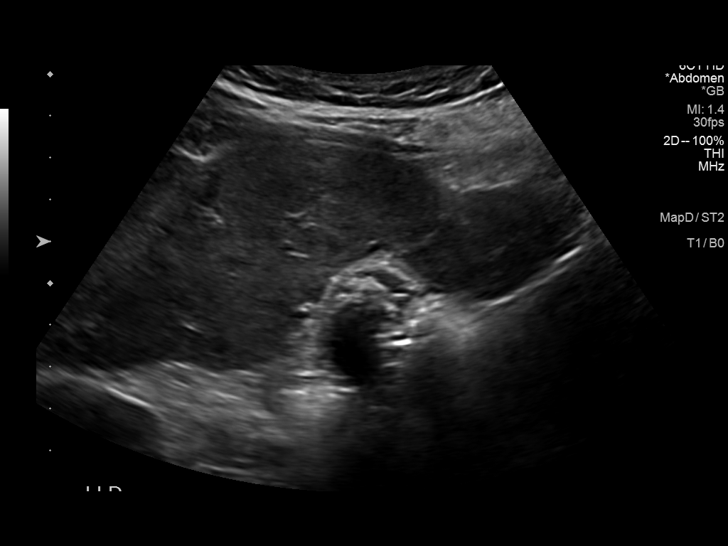
[im 14/42]
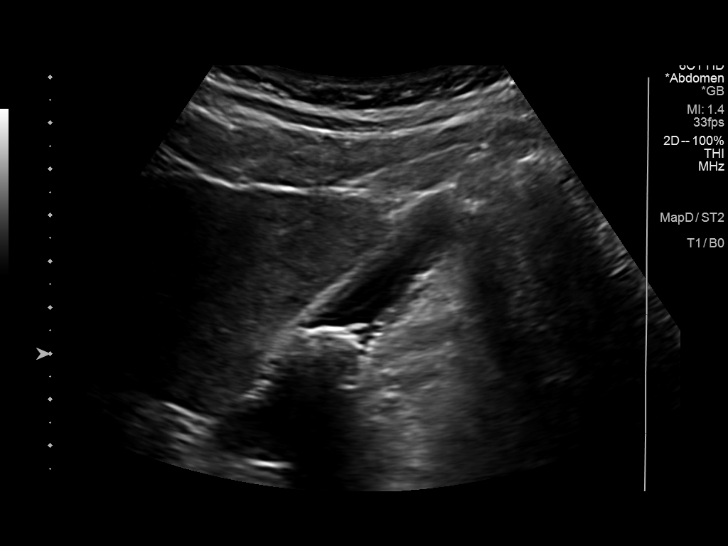
[im 16/42]
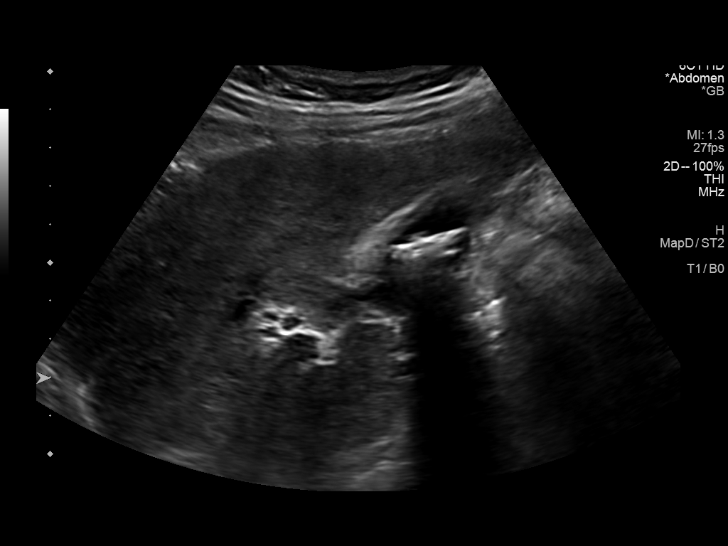
[im 19/42]
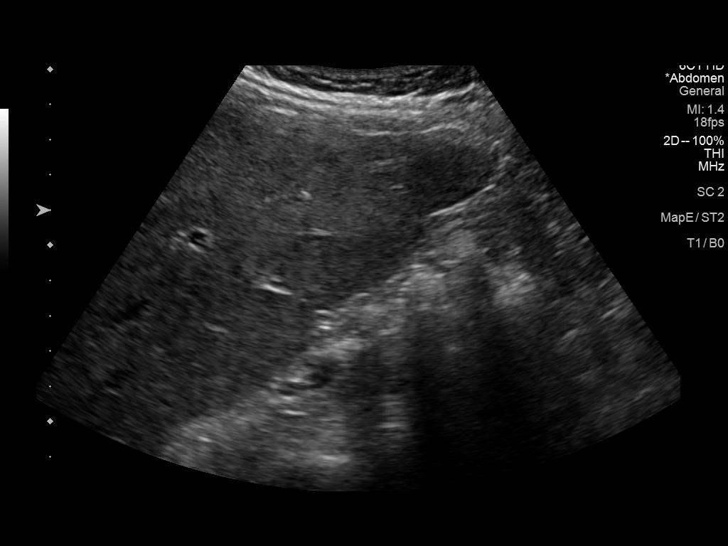
[im 23/42]
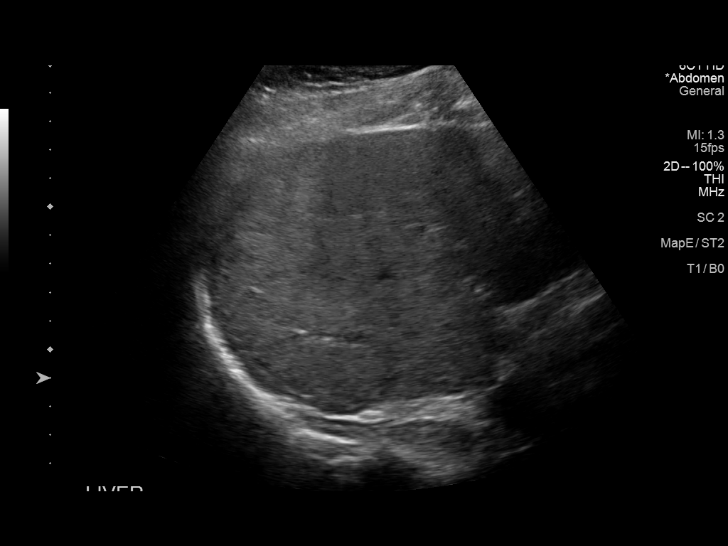
[im 26/42]
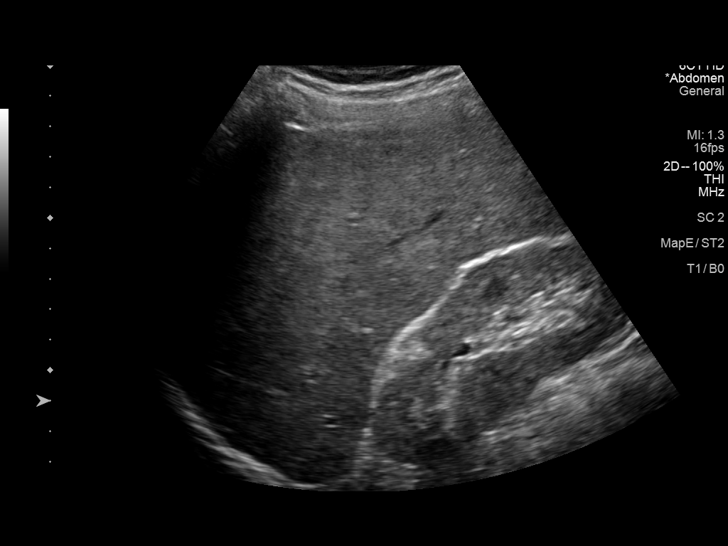
[im 28/42]
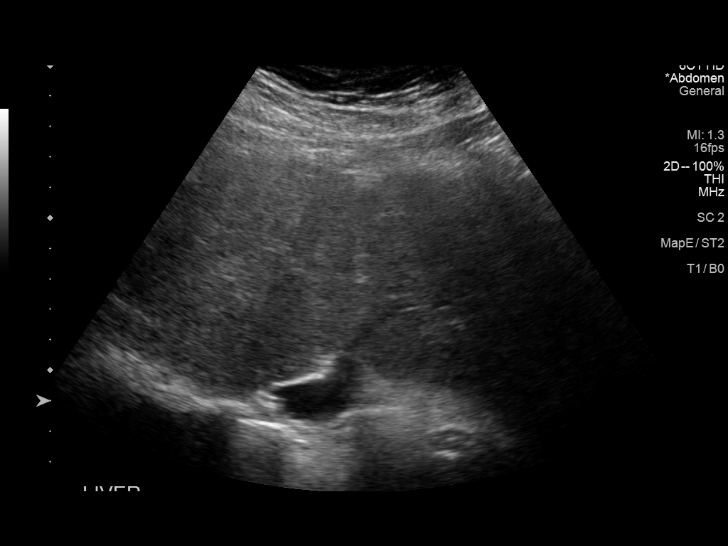
[im 31/42]
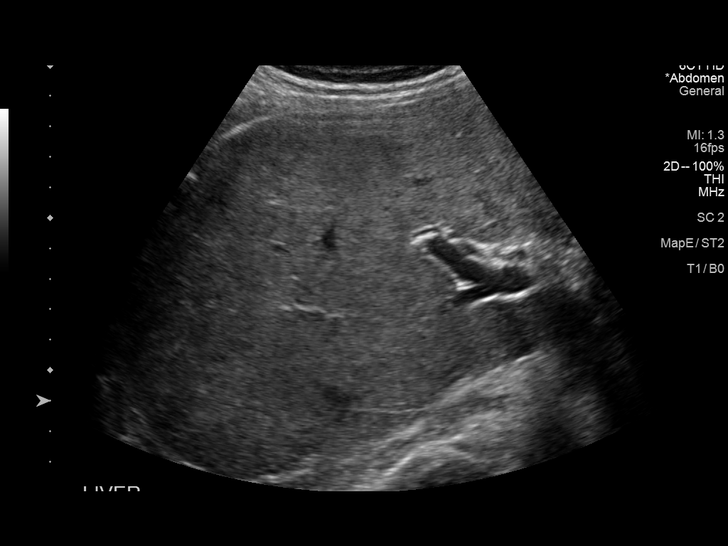
[im 35/42]
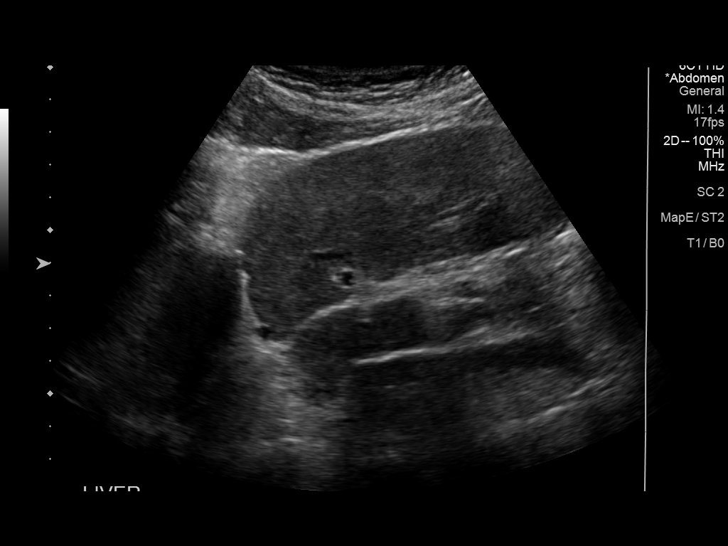
[im 38/42]
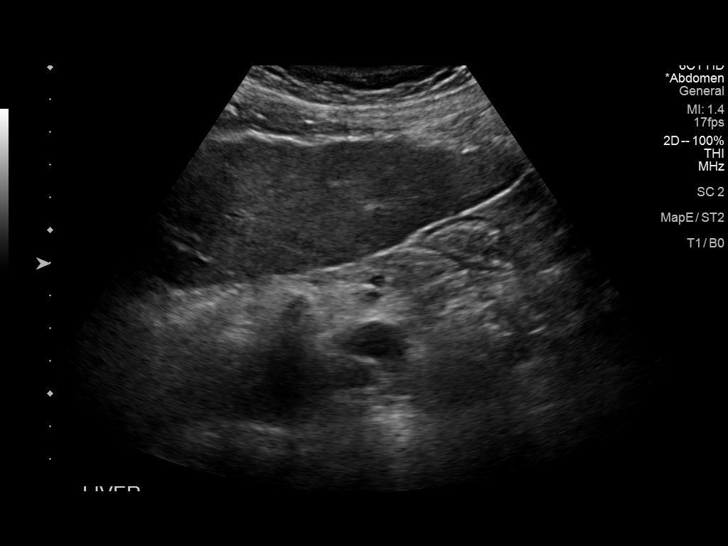
[im 42/42]
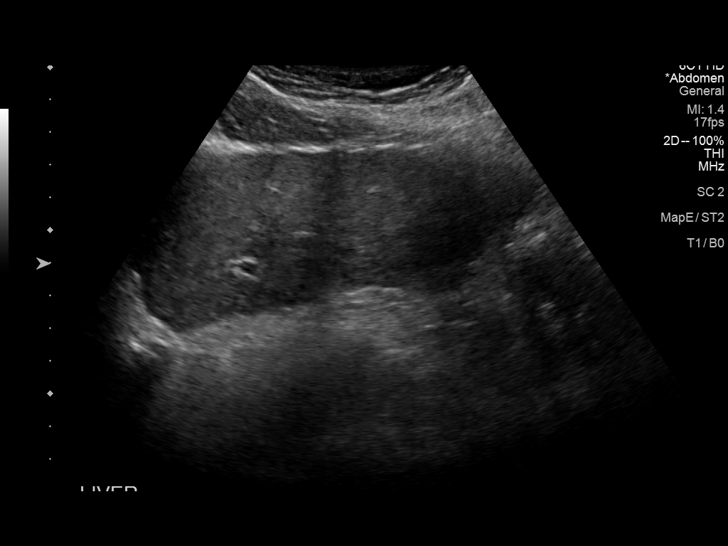

[14 of 25 positions shown; findings below may reference images not displayed]

FINDINGS: Gallbladder:

There is a stone in the gallbladder. No gallbladder wall thickening
or pericholecystic fluid. Negative sonographic Murphy's sign.

Common bile duct:

Diameter: 3 mm

Liver:

Morphologic changes of cirrhosis. No discrete mass. Portal vein is
patent on color Doppler imaging with normal direction of blood flow
towards the liver.

Other: None.
IMPRESSION: 1. Cirrhosis.
2. Patent main portal vein with hepatopetal flow.
3. No ascites.
4. Gallstone.
